# Patient Record
Sex: Female | Born: 1955 | Race: Black or African American | Hispanic: No | Marital: Single | Smoking: Current every day smoker
Health system: Southern US, Community
[De-identification: ages and names within clinical notes are randomized; demographics above are authoritative.]

## PROBLEM LIST (undated history)

## (undated) DIAGNOSIS — F419 Anxiety disorder, unspecified: Secondary | ICD-10-CM

## (undated) DIAGNOSIS — Z780 Asymptomatic menopausal state: Secondary | ICD-10-CM

## (undated) DIAGNOSIS — F411 Generalized anxiety disorder: Secondary | ICD-10-CM

## (undated) DIAGNOSIS — F1020 Alcohol dependence, uncomplicated: Secondary | ICD-10-CM

## (undated) DIAGNOSIS — S83281A Other tear of lateral meniscus, current injury, right knee, initial encounter: Secondary | ICD-10-CM

## (undated) DIAGNOSIS — G4733 Obstructive sleep apnea (adult) (pediatric): Secondary | ICD-10-CM

## (undated) DIAGNOSIS — R519 Headache, unspecified: Secondary | ICD-10-CM

## (undated) DIAGNOSIS — I10 Essential (primary) hypertension: Secondary | ICD-10-CM

## (undated) DIAGNOSIS — K219 Gastro-esophageal reflux disease without esophagitis: Secondary | ICD-10-CM

## (undated) DIAGNOSIS — G8929 Other chronic pain: Secondary | ICD-10-CM

## (undated) DIAGNOSIS — Z972 Presence of dental prosthetic device (complete) (partial): Secondary | ICD-10-CM

## (undated) DIAGNOSIS — B029 Zoster without complications: Secondary | ICD-10-CM

## (undated) DIAGNOSIS — J439 Emphysema, unspecified: Secondary | ICD-10-CM

## (undated) DIAGNOSIS — G4730 Sleep apnea, unspecified: Secondary | ICD-10-CM

## (undated) DIAGNOSIS — R011 Cardiac murmur, unspecified: Secondary | ICD-10-CM

## (undated) DIAGNOSIS — E785 Hyperlipidemia, unspecified: Secondary | ICD-10-CM

## (undated) DIAGNOSIS — F32A Depression, unspecified: Secondary | ICD-10-CM

## (undated) DIAGNOSIS — Z973 Presence of spectacles and contact lenses: Secondary | ICD-10-CM

## (undated) DIAGNOSIS — E0500 Thyrotoxicosis with diffuse goiter without thyrotoxic crisis or storm: Secondary | ICD-10-CM

## (undated) DIAGNOSIS — M549 Dorsalgia, unspecified: Secondary | ICD-10-CM

## (undated) DIAGNOSIS — J449 Chronic obstructive pulmonary disease, unspecified: Secondary | ICD-10-CM

## (undated) DIAGNOSIS — R51 Headache: Secondary | ICD-10-CM

## (undated) DIAGNOSIS — F329 Major depressive disorder, single episode, unspecified: Secondary | ICD-10-CM

## (undated) DIAGNOSIS — M069 Rheumatoid arthritis, unspecified: Secondary | ICD-10-CM

## (undated) DIAGNOSIS — IMO0002 Reserved for concepts with insufficient information to code with codable children: Secondary | ICD-10-CM

## (undated) DIAGNOSIS — T7840XA Allergy, unspecified, initial encounter: Secondary | ICD-10-CM

## (undated) DIAGNOSIS — M1990 Unspecified osteoarthritis, unspecified site: Secondary | ICD-10-CM

## (undated) DIAGNOSIS — J392 Other diseases of pharynx: Secondary | ICD-10-CM

## (undated) DIAGNOSIS — G629 Polyneuropathy, unspecified: Secondary | ICD-10-CM

## (undated) DIAGNOSIS — M810 Age-related osteoporosis without current pathological fracture: Secondary | ICD-10-CM

## (undated) DIAGNOSIS — K429 Umbilical hernia without obstruction or gangrene: Secondary | ICD-10-CM

## (undated) HISTORY — DX: Emphysema, unspecified: J43.9

## (undated) HISTORY — DX: Hyperlipidemia, unspecified: E78.5

## (undated) HISTORY — DX: Alcohol dependence, uncomplicated: F10.20

## (undated) HISTORY — PX: TUBAL LIGATION: SHX77

## (undated) HISTORY — PX: TONSILLECTOMY: SUR1361

## (undated) HISTORY — DX: Thyrotoxicosis with diffuse goiter without thyrotoxic crisis or storm: E05.00

## (undated) HISTORY — DX: Age-related osteoporosis without current pathological fracture: M81.0

## (undated) HISTORY — DX: Headache: R51

## (undated) HISTORY — DX: Headache, unspecified: R51.9

## (undated) HISTORY — DX: Polyneuropathy, unspecified: G62.9

## (undated) HISTORY — DX: Reserved for concepts with insufficient information to code with codable children: IMO0002

## (undated) HISTORY — DX: Other chronic pain: G89.29

## (undated) HISTORY — PX: FOOT SURGERY: SHX648

## (undated) HISTORY — PX: LAPAROSCOPY: SHX197

## (undated) HISTORY — DX: Obstructive sleep apnea (adult) (pediatric): G47.33

## (undated) HISTORY — PX: CHOLECYSTECTOMY: SHX55

## (undated) HISTORY — PX: EYE SURGERY: SHX253

## (undated) HISTORY — DX: Cardiac murmur, unspecified: R01.1

## (undated) HISTORY — DX: Allergy, unspecified, initial encounter: T78.40XA

## (undated) HISTORY — DX: Essential (primary) hypertension: I10

## (undated) HISTORY — DX: Gastro-esophageal reflux disease without esophagitis: K21.9

---

## 2011-03-02 ENCOUNTER — Institutional Professional Consult (permissible substitution): Payer: Self-pay | Admitting: Pulmonary Disease

## 2011-03-18 ENCOUNTER — Ambulatory Visit (INDEPENDENT_AMBULATORY_CARE_PROVIDER_SITE_OTHER): Payer: Medicaid Other | Admitting: Pulmonary Disease

## 2011-03-18 ENCOUNTER — Encounter: Payer: Self-pay | Admitting: Pulmonary Disease

## 2011-03-18 VITALS — BP 128/84 | HR 91 | Temp 98.6°F | Ht 69.0 in | Wt 241.6 lb

## 2011-03-18 DIAGNOSIS — G4733 Obstructive sleep apnea (adult) (pediatric): Secondary | ICD-10-CM

## 2011-03-18 NOTE — Assessment & Plan Note (Signed)
The patient has a diagnosis of obstructive sleep apnea, and has been on CPAP successfully since 2009.  She has just moved here recently, and needs to establish with a medical equipment company.  She feels that she is not sleeping as well, and that she may be having breakthrough obstructive events.  Her weight is up about 30 pounds over the last few years, and it is likely that she may need an increase in her CPAP pressure.  The condition of her mask and supplies is also unknown.  I will set her up with a DME, check on her machine, and also look again at her pressure with an auto device for 2 weeks.  I have also encouraged her to work aggressively on weight loss.

## 2011-03-18 NOTE — Patient Instructions (Signed)
Will get you set up with medical equipment company Will re-optimize your pressure at home for next 2 weeks, and will let you know the results. Work on weight loss If doing well, will see you back in 6mos.

## 2011-03-18 NOTE — Progress Notes (Signed)
  Subjective:    Patient ID: Vanessa [name], female    DOB: 09-07-1955, 55 y.o.   MRN: [id number]  HPI The patient is a 55 year old female who I've been asked to see for management of obstructive sleep apnea.  The patient recently moved from [place], and needs to establish with a sleep physician and durable medical equipment company.  The patient was diagnosed with sleep apnea in 2009, and has been on CPAP since that time.  She feels that she has done well with the device, however most recently feels that she is having breakthrough obstructive events which leads to nonrestorative sleep.  The patient currently uses a nasal CPAP mask, and does have a heated humidifier.  She feels that her alertness during the day is adequate, and her Epworth score is only 8.  She does state that her weight is up about 30 pounds over the last 2 years.  Sleep Questionnaire: What time do you typically go to bed?( Between what hours) 9:00 pm How long does it take you to fall asleep? 2 How many times during the night do you wake up? What time do you get out of bed to start your day? 0200 1400 Do you drive or operate heavy machinery in your occupation? No How much has your weight changed (up or down) over the past two years? (In pounds) 30 lb (13.608 kg) Have you ever had a sleep study before? Yes If yes, location of study? [place], [place] with Dr. Lendell Caprice If yes, date of study? 2009 Do you currently use CPAP? Yes If so, what pressure? 15cm Do you wear oxygen at any time? No     Review of Systems  Constitutional: Positive for unexpected weight change. Negative for fever.  HENT: Negative for ear pain, nosebleeds, congestion, sore throat, rhinorrhea, sneezing, trouble swallowing, dental problem, postnasal drip and sinus pressure.   Eyes: Negative for redness and itching.  Respiratory: Positive for cough and shortness of breath. Negative for chest tightness and wheezing.   Cardiovascular: Positive for palpitations and leg  swelling.  Gastrointestinal: Negative for nausea and vomiting.  Genitourinary: Negative for dysuria.  Musculoskeletal: Positive for joint swelling.  Skin: Negative for rash.  Neurological: Negative for headaches.  Hematological: Does not bruise/bleed easily.  Psychiatric/Behavioral: Positive for dysphoric mood. Negative for decreased concentration. The patient is nervous/anxious.        Objective:   Physical Exam Constitutional:  Obese female, no acute distress  HENT:  Nares patent without discharge  Oropharynx without exudate, palate elongated, uvula normal, +side wall narrowing.  Eyes:  Perrla, eomi, no scleral icterus  Neck:  No JVD, no TMG  Cardiovascular:  Normal rate, regular rhythm, no rubs or gallops.  No murmurs        Intact distal pulses  Pulmonary :  Normal breath sounds, no stridor or respiratory distress   No rales, rhonchi, or wheezing  Abdominal:  Soft, nondistended, bowel sounds present.  No tenderness noted.   Musculoskeletal:  No lower extremity edema noted.  Lymph Nodes:  No cervical lymphadenopathy noted  Skin:  No cyanosis noted  Neurologic:  Alert, appropriate, moves all 4 extremities without obvious deficit.         Assessment & Plan:

## 2011-04-04 ENCOUNTER — Encounter: Payer: Self-pay | Admitting: Family Medicine

## 2011-04-04 NOTE — Telephone Encounter (Signed)
Error

## 2011-04-16 ENCOUNTER — Telehealth: Payer: Self-pay | Admitting: Pulmonary Disease

## 2011-04-16 NOTE — Telephone Encounter (Signed)
PCCs, please advise.  [facility] sent order 03/18/11 for DME referral to check her cpap machine, get download and put machine on auto.  Pt stating this hasn't been done yet.  Please advise.  Thanks.

## 2011-04-17 NOTE — Telephone Encounter (Signed)
DME is unable to set pt up with any supplies until sleep study has been received. Called and spoke with patient and she is aware that is why she has not heard from DME. Pt was advised that this may be an issue when order was placed. Asked patient to call Dr. Jilda Roche office and let them know that we have not received this study and she is unable to move forward with any supplies or equipment until this is received. Phone note sent back to Dr. Teddy Spike nurse to refax medical release as a second request to try and obtain study.

## 2011-04-22 NOTE — Telephone Encounter (Signed)
Finally received her sleep study results.  I have given these results to Bjorn Loser to fax over to pt's DME company so pt can get set up with cpap machine/supplies.  Called and spoke with pt. Pt aware of this.  Nothing further needed.

## 2011-05-06 ENCOUNTER — Encounter: Payer: Self-pay | Admitting: Pulmonary Disease

## 2011-05-28 ENCOUNTER — Telehealth: Payer: Self-pay | Admitting: *Deleted

## 2011-05-28 NOTE — Telephone Encounter (Signed)
Michelle at [facility] requesting pt's height and weight for pt/s medical equipment.  Info given.

## 2011-06-04 ENCOUNTER — Encounter (HOSPITAL_COMMUNITY): Payer: Self-pay

## 2011-06-04 ENCOUNTER — Emergency Department (HOSPITAL_COMMUNITY)
Admission: EM | Admit: 2011-06-04 | Discharge: 2011-06-04 | Disposition: A | Discharge status: Home / Self Care | Payer: Medicaid Other | Attending: Emergency Medicine | Admitting: Emergency Medicine

## 2011-06-04 ENCOUNTER — Emergency Department (HOSPITAL_COMMUNITY): Payer: Medicaid Other

## 2011-06-04 DIAGNOSIS — Z79899 Other long term (current) drug therapy: Secondary | ICD-10-CM | POA: Insufficient documentation

## 2011-06-04 DIAGNOSIS — G4733 Obstructive sleep apnea (adult) (pediatric): Secondary | ICD-10-CM | POA: Insufficient documentation

## 2011-06-04 DIAGNOSIS — I10 Essential (primary) hypertension: Secondary | ICD-10-CM | POA: Insufficient documentation

## 2011-06-04 DIAGNOSIS — S92919A Unspecified fracture of unspecified toe(s), initial encounter for closed fracture: Secondary | ICD-10-CM | POA: Insufficient documentation

## 2011-06-04 DIAGNOSIS — E119 Type 2 diabetes mellitus without complications: Secondary | ICD-10-CM | POA: Insufficient documentation

## 2011-06-04 DIAGNOSIS — S90129A Contusion of unspecified lesser toe(s) without damage to nail, initial encounter: Secondary | ICD-10-CM | POA: Insufficient documentation

## 2011-06-04 DIAGNOSIS — M79609 Pain in unspecified limb: Secondary | ICD-10-CM | POA: Insufficient documentation

## 2011-06-04 DIAGNOSIS — J438 Other emphysema: Secondary | ICD-10-CM | POA: Insufficient documentation

## 2011-06-04 DIAGNOSIS — E0500 Thyrotoxicosis with diffuse goiter without thyrotoxic crisis or storm: Secondary | ICD-10-CM | POA: Insufficient documentation

## 2011-06-04 DIAGNOSIS — E785 Hyperlipidemia, unspecified: Secondary | ICD-10-CM | POA: Insufficient documentation

## 2011-06-04 DIAGNOSIS — W2203XA Walked into furniture, initial encounter: Secondary | ICD-10-CM | POA: Insufficient documentation

## 2011-06-04 MED ORDER — OXYCODONE-ACETAMINOPHEN 5-325 MG PO TABS: 2.0000 | ORAL_TABLET | ORAL | Status: AC | PRN

## 2011-06-04 MED ORDER — OXYCODONE-ACETAMINOPHEN 5-325 MG PO TABS
1.0000 | ORAL_TABLET | Freq: Once | ORAL | Status: AC
  Administered 2011-06-04: 1 via ORAL
  Filled 2011-06-04: qty 1

## 2011-06-04 NOTE — Progress Notes (Signed)
Orthopedic Tech Progress Note Patient Details:  Vanessa [name] 07-11-1955 [id number]  Other Ortho Devices Type of Ortho Device: Buddy tape Ortho Device Interventions: Application   Cammer, Mickie Bail 06/04/2011, 1:58 PM

## 2011-06-04 NOTE — ED Notes (Signed)
Pt presents with bilateral 5th toe pain.  Pt reports stumping L 5th toe x 2 weeks ago and again 2 nights ago as well as R 5th toe x 2 night ago.

## 2011-06-04 NOTE — ED Provider Notes (Signed)
History     CSN: [id number]  Arrival date & time 06/04/11  1045   First MD Initiated Contact with Patient 06/04/11 1204      Chief Complaint  Patient presents with  . Toe Pain    (Consider location/radiation/quality/duration/timing/severity/associated sxs/prior treatment) Patient is a 56 y.o. female presenting with toe pain. The history is provided by the patient.  Toe Pain   patient presents with bilateral fifth toe pain after sustaining an injury to both toes against a piece of furniture. Pain described as sharp worse with walking made better with rest. Pain medications given prior to arrival haven't helped somewhat. No treatment done prior to arrival  Past Medical History  Diagnosis Date  . Hypertension   . Heart murmur   . Asthma   . Emphysema   . Diabetes mellitus   . Hyperlipidemia   . Allergic rhinitis   . Chronic headache   . OSA (obstructive sleep apnea)   . Neuropathy   . Grave's disease   . Herniated disc     Past Surgical History  Procedure Date  . Cholecystectomy   . Tubal ligation     Family History  Problem Relation Age of Onset  . Emphysema Mother   . Allergies Mother   . Allergies Daughter   . Asthma Mother   . Asthma Brother   . Heart disease Mother   . Rheum arthritis Mother   . Breast cancer Other     aunt  . Lung cancer Brother   . Throat cancer Brother     History  Substance Use Topics  . Smoking status: Current Everyday Smoker -- 1.0 packs/day for 40 years    Types: Cigarettes  . Smokeless tobacco: Not on file  . Alcohol Use: Not on file    OB History    Grav Para Term Preterm Abortions TAB SAB Ect Mult Living                  Review of Systems  All other systems reviewed and are negative.    Allergies  Ciprofloxacin and Penicillins  Home Medications   Current Outpatient Rx  Name Route Sig Dispense Refill  . ACYCLOVIR 800 MG PO TABS Oral Take 800 mg by mouth daily.      . ALBUTEROL SULFATE HFA 108 (90 BASE)  MCG/ACT IN AERS Inhalation Inhale 2 puffs into the lungs every 6 (six) hours as needed.      . BC FAST PAIN RELIEF PO Oral Take 3 Packages by mouth once.    . BECLOMETHASONE DIPROPIONATE 80 MCG/ACT IN AERS Inhalation Inhale 1 puff into the lungs 3 (three) times daily.      . BUSPIRONE HCL 30 MG PO TABS Oral Take 30 mg by mouth 2 (two) times daily.      . CYCLOBENZAPRINE HCL 10 MG PO TABS Oral Take 10 mg by mouth 3 (three) times daily.      Marland Kitchen FLUOXETINE HCL 20 MG PO CAPS Oral Take 60 mg by mouth daily.      Marland Kitchen HYDROCODONE-ACETAMINOPHEN 10-325 MG PO TABS Oral Take 1 tablet by mouth 3 (three) times daily.    . IPRATROPIUM-ALBUTEROL 0.5-2.5 (3) MG/3ML IN SOLN Nebulization Take 3 mLs by nebulization as needed.      Marland Kitchen LEVOTHYROXINE SODIUM 125 MCG PO TABS Oral Take 125 mcg by mouth daily.      Marland Kitchen LISINOPRIL 5 MG PO TABS Oral Take 5 mg by mouth daily.      Marland Kitchen  METFORMIN HCL 1000 MG PO TABS Oral Take 1,000 mg by mouth 2 (two) times daily.      Marland Kitchen MIRTAZAPINE 45 MG PO TABS Oral Take 45 mg by mouth at bedtime.      Marland Kitchen SIMVASTATIN 40 MG PO TABS Oral Take 40 mg by mouth at bedtime.        BP 140/93  Pulse 91  Temp(Src) 98.2 F (36.8 C) (Oral)  Resp 20  SpO2 97%  Physical Exam  Nursing note and vitals reviewed. Constitutional: She is oriented to person, place, and time. She appears well-developed and well-nourished.  Non-toxic appearance.  HENT:  Head: Normocephalic and atraumatic.  Eyes: Conjunctivae are normal. Pupils are equal, round, and reactive to light.  Neck: Normal range of motion.  Cardiovascular: Normal rate.   Pulmonary/Chest: Effort normal.  Musculoskeletal:       Fifth digits of both toes with slight tenderness and bruising. Range of motion is somewhat limited by pain skin intact  Neurological: She is alert and oriented to person, place, and time.  Skin: Skin is warm and dry.  Psychiatric: She has a normal mood and affect.    ED Course  Procedures (including critical care  time)  Labs Reviewed - No data to display No results found.   No diagnosis found.    MDM  Toe x-rays noted. Patient's toes buddy taped.        Toy Baker, MD 06/04/11 1324

## 2011-06-04 NOTE — Progress Notes (Signed)
Orthopedic Tech Progress Note Patient Details:  Vanessa [name] [birth year] [id number]  Other Ortho Devices Type of Ortho Device: Buddy tape Ortho Device Interventions: Application   Cammer, Mickie Bail 06/04/2011, 1:58 PM Buddy tape x2

## 2011-06-04 NOTE — Discharge Instructions (Signed)
Toe Fracture  Your caregiver has diagnosed you as having a fractured toe. A toe fracture is a break in the bone of a toe. "Buddy taping" is a way of splinting your broken toe, by taping the broken toe to the toe next to it. This "buddy taping" will keep the injured toe from moving beyond normal range of motion. Buddy taping also helps the toe heal in a more normal alignment. It may take 6 to 8 weeks for the toe injury to heal.  HOME CARE INSTRUCTIONS   · Leave your toes taped together for as long as directed by your caregiver or until you see a doctor for a follow-up examination. You can change the tape after bathing. Always use a small piece of gauze or cotton between the toes when taping them together. This will help the skin stay dry and prevent infection.  · Apply ice to the injury for 15 to 20 minutes each hour while awake for the first 2 days. Put the ice in a plastic bag and place a towel between the bag of ice and your skin.  · After the first 2 days, apply heat to the injured area. Use heat for the next 2 to 3 days. Place a heating pad on the foot or soak the foot in warm water as directed by your caregiver.  · Keep your foot elevated as much as possible to lessen swelling.  · Wear sturdy, supportive shoes. The shoes should not pinch the toes or fit tightly against the toes.  · Your caregiver may prescribe a rigid shoe if your foot is very swollen.  · Your may be given crutches if the pain is too great and it hurts too much to walk.  · Only take over-the-counter or prescription medicines for pain, discomfort, or fever as directed by your caregiver.  · If your caregiver has given you a follow-up appointment, it is very important to keep that appointment. Not keeping the appointment could result in a chronic or permanent injury, pain, and disability. If there is any problem keeping the appointment, you must call back to this facility for assistance.  SEEK MEDICAL CARE IF:   · You have increased pain or  swelling, not relieved with medications.  · The pain does not get better after 1 week.  · Your injured toe is cold when the others are warm.  SEEK IMMEDIATE MEDICAL CARE IF:   · The toe becomes cold, numb, or white.  · The toe becomes hot (inflamed) and red.  Document Released: 03/13/2000 Document Revised: 03/05/2011 Document Reviewed: 10/31/2007  ExitCare® Patient Information ©2012 [employer].

## 2011-06-04 NOTE — ED Notes (Signed)
Ortho tech paged  

## 2011-06-08 ENCOUNTER — Telehealth: Payer: Self-pay | Admitting: Pulmonary Disease

## 2011-06-08 NOTE — Telephone Encounter (Signed)
Will send to Alida to see if she has seen this CMN.

## 2011-06-08 NOTE — Telephone Encounter (Signed)
Spoke with Marcelino Duster at [facility] and informed her we do have the cmn for cpap supplies, it is in Dr Shelle Iron to sign folder, once it is signed we will fax .Kandice Hams

## 2011-06-22 ENCOUNTER — Telehealth: Payer: Self-pay | Admitting: Pulmonary Disease

## 2011-06-30 ENCOUNTER — Telehealth: Payer: Self-pay | Admitting: Pulmonary Disease

## 2011-06-30 NOTE — Telephone Encounter (Signed)
KC, these results have been palced in your VIP folder. Please advise thanks

## 2011-06-30 NOTE — Telephone Encounter (Signed)
I spoke with pt she states she had a download her cpap downloaded a while ago but never heard anything back regarding this.  Aundra Millet looked in [employer] and did not receive this. I called [facility] and they are faxing download over now. Will await fax

## 2011-07-01 NOTE — Telephone Encounter (Signed)
I spoke with Stacy at [facility], [phone], and she is aware that Dr. Shelle Iron only wants pt's download from Dec 2012 through now. Stacy verbalized understanding and will fax this to triage at [phone], ATTN:  Jerrine Urschel.

## 2011-07-01 NOTE — Telephone Encounter (Signed)
Let pt know that I never received her download until yesterday.  They also downloaded data from June of last year until now, rather than from Dec when I ordered the download.  Will need to download her machine again from Dec 2012 until now!!!!! As ordered.  Have them fax this to triage and then to me directly with note to check it.  Thanks.

## 2011-07-01 NOTE — Telephone Encounter (Signed)
Will forward msg to Aundra Millet so she may look for download being refaxed.

## 2011-07-03 NOTE — Telephone Encounter (Signed)
Called and spoke with Dawn at [facility] and stated we still haven't received results.  Asked that she refax to the triage fax #.  Awaiting fax.

## 2011-07-03 NOTE — Telephone Encounter (Signed)
Received download from [facility] and data was from wrong AGAIN!! Still showed data from June 2012 to Feb 2013.  Called and spoke with Selena Batten at [facility] and asked they refax with correct data from Dec 2012 to current.  Selena Batten stated she would take care of this

## 2011-07-08 NOTE — Telephone Encounter (Signed)
Kim from [facility] called & asked to speak w/ Megan.  Selena Batten can be reached at [phone].  Antionette Fairy

## 2011-07-08 NOTE — Telephone Encounter (Signed)
Still haven't received download.  Called and spoke with Sue Lush at [facility] and informed her I still have received results and they needs to be addressed TODAY!!!  Sue Lush stated she would address this and fax results today.

## 2011-07-08 NOTE — Telephone Encounter (Signed)
Aundra Millet did you ever receive correct fax. Please advise thanks

## 2011-07-08 NOTE — Telephone Encounter (Signed)
Called and spoke with Selena Batten and informed her correct download finally received via fax.  Put download in [facility] VIP folder for him to review

## 2011-07-09 ENCOUNTER — Other Ambulatory Visit: Payer: Self-pay | Admitting: Internal Medicine

## 2011-07-09 ENCOUNTER — Encounter: Payer: Self-pay | Admitting: Pulmonary Disease

## 2011-07-09 DIAGNOSIS — Z1231 Encounter for screening mammogram for malignant neoplasm of breast: Secondary | ICD-10-CM

## 2011-07-09 NOTE — Telephone Encounter (Signed)
Let pt know that her download shows optimal pressure 15cm, but she had very large mask leak with breakthrough events.  The options are to get a better fitting mask, then redo the auto for 2 weeks vs putting on 15cm and getting a better fitting mask and see how she does.  Then if she doesn't do well, can redo the auto for 2 weeks. See what she wants to do.

## 2011-07-09 NOTE — Telephone Encounter (Signed)
ATC the pt, NA and not able to leave a msg, [facility]

## 2011-07-10 ENCOUNTER — Other Ambulatory Visit: Payer: Self-pay | Admitting: Pulmonary Disease

## 2011-07-10 DIAGNOSIS — G4733 Obstructive sleep apnea (adult) (pediatric): Secondary | ICD-10-CM

## 2011-07-10 NOTE — Telephone Encounter (Signed)
Spoke with pt and notified of results/recs per KC. She verbalized understanding. States that she prefers to have the new mask and redo the auto download. Will forward back to [facility].

## 2011-07-15 NOTE — Telephone Encounter (Signed)
Order placed on 07/10/11. Will close this msg.

## 2011-07-21 ENCOUNTER — Inpatient Hospital Stay (HOSPITAL_COMMUNITY)
Admission: AD | Admit: 2011-07-21 | Discharge: 2011-07-21 | Disposition: A | Discharge status: Home / Self Care | Payer: Medicaid Other | Source: Ambulatory Visit | Attending: Obstetrics & Gynecology | Admitting: Obstetrics & Gynecology

## 2011-07-21 ENCOUNTER — Encounter (HOSPITAL_COMMUNITY): Payer: Self-pay | Admitting: *Deleted

## 2011-07-21 DIAGNOSIS — J4489 Other specified chronic obstructive pulmonary disease: Secondary | ICD-10-CM | POA: Insufficient documentation

## 2011-07-21 DIAGNOSIS — E119 Type 2 diabetes mellitus without complications: Secondary | ICD-10-CM | POA: Insufficient documentation

## 2011-07-21 DIAGNOSIS — J449 Chronic obstructive pulmonary disease, unspecified: Secondary | ICD-10-CM | POA: Insufficient documentation

## 2011-07-21 DIAGNOSIS — R059 Cough, unspecified: Secondary | ICD-10-CM | POA: Insufficient documentation

## 2011-07-21 DIAGNOSIS — R05 Cough: Secondary | ICD-10-CM | POA: Insufficient documentation

## 2011-07-21 DIAGNOSIS — B373 Candidiasis of vulva and vagina: Secondary | ICD-10-CM

## 2011-07-21 DIAGNOSIS — B3731 Acute candidiasis of vulva and vagina: Secondary | ICD-10-CM

## 2011-07-21 DIAGNOSIS — I10 Essential (primary) hypertension: Secondary | ICD-10-CM | POA: Insufficient documentation

## 2011-07-21 HISTORY — DX: Unspecified osteoarthritis, unspecified site: M19.90

## 2011-07-21 HISTORY — DX: Other chronic pain: G89.29

## 2011-07-21 HISTORY — DX: Chronic obstructive pulmonary disease, unspecified: J44.9

## 2011-07-21 HISTORY — DX: Zoster without complications: B02.9

## 2011-07-21 HISTORY — DX: Major depressive disorder, single episode, unspecified: F32.9

## 2011-07-21 HISTORY — DX: Asymptomatic menopausal state: Z78.0

## 2011-07-21 HISTORY — DX: Dorsalgia, unspecified: M54.9

## 2011-07-21 HISTORY — DX: Sleep apnea, unspecified: G47.30

## 2011-07-21 HISTORY — DX: Depression, unspecified: F32.A

## 2011-07-21 HISTORY — DX: Anxiety disorder, unspecified: F41.9

## 2011-07-21 HISTORY — DX: Umbilical hernia without obstruction or gangrene: K42.9

## 2011-07-21 LAB — URINALYSIS, ROUTINE W REFLEX MICROSCOPIC
Glucose, UA: NEGATIVE mg/dL
Hgb urine dipstick: NEGATIVE
Ketones, ur: NEGATIVE mg/dL
Protein, ur: NEGATIVE mg/dL
Specific Gravity, Urine: >1.03 — ABNORMAL HIGH (ref 1.005–1.030)
pH: 6 (ref 5.0–8.0)

## 2011-07-21 LAB — WET PREP, GENITAL: Clue Cells Wet Prep HPF POC: NONE SEEN

## 2011-07-21 MED ORDER — FLUCONAZOLE 150 MG PO TABS: 150.0000 mg | ORAL_TABLET | Freq: Once | ORAL | Status: AC

## 2011-07-21 NOTE — MAU Note (Signed)
Patient states she was seen by her primary MD on 4-16 [facility]). States she has had a chronic cough, all over aching, back pain, shingles, thrush, dry mouth, infection in toe and foot. Itchy rash on bottom. States she is not getting better.

## 2011-07-21 NOTE — Discharge Instructions (Signed)
Candida Infection, Adult A candida infection (also called yeast, fungus and Monilia infection) is an overgrowth of yeast that can occur anywhere on the body. A yeast infection commonly occurs in warm, moist body areas. Usually, the infection remains localized but can spread to become a systemic infection. A yeast infection may be a sign of a more severe disease such as diabetes, leukemia, or AIDS. A yeast infection can occur in both men and women. In women, Candida vaginitis is a vaginal infection. It is one of the most common causes of vaginitis. Men usually do not have symptoms or know they have an infection until other problems develop. Men may find out they have a yeast infection because their sex partner has a yeast infection. Uncircumcised men are more likely to get a yeast infection than circumcised men. This is because the uncircumcised glans is not exposed to air and does not remain as dry as that of a circumcised glans. Older adults may develop yeast infections around dentures. CAUSES  Women  Antibiotics.   Steroid medication taken for a long time.   Being overweight (obese).   Diabetes.   Poor immune condition.   Certain serious medical conditions.   Immune suppressive medications for organ transplant patients.   Chemotherapy.   Pregnancy.   Menstration.   Stress and fatigue.   Intravenous drug use.   Oral contraceptives.   Wearing tight-fitting clothes in the crotch area.   Catching it from a sex partner who has a yeast infection.   Spermicide.   Intravenous, urinary, or other catheters.  Men  Catching it from a sex partner who has a yeast infection.   Having oral or anal sex with a person who has the infection.   Spermicide.   Diabetes.   Antibiotics.   Poor immune system.   Medications that suppress the immune system.   Intravenous drug use.   Intravenous, urinary, or other catheters.  SYMPTOMS  Women  Thick, white vaginal discharge.    Vaginal itching.   Redness and swelling in and around the vagina.   Irritation of the lips of the vagina and perineum.   Blisters on the vaginal lips and perineum.   Painful sexual intercourse.   Low blood sugar (hypoglycemia).   Painful urination.   Bladder infections.   Intestinal problems such as constipation, indigestion, bad breath, bloating, increase in gas, diarrhea, or loose stools.  Men  Men may develop intestinal problems such as constipation, indigestion, bad breath, bloating, increase in gas, diarrhea, or loose stools.   Dry, cracked skin on the penis with itching or discomfort.   Jock itch.   Dry, flaky skin.   Athlete's foot.   Hypoglycemia.  DIAGNOSIS  Women  A history and an exam are performed.   The discharge may be examined under a microscope.   A culture may be taken of the discharge.  Men  A history and an exam are performed.   Any discharge from the penis or areas of cracked skin will be looked at under the microscope and cultured.   Stool samples may be cultured.  TREATMENT  Women  Vaginal antifungal suppositories and creams.   Medicated creams to decrease irritation and itching on the outside of the vagina.   Warm compresses to the perineal area to decrease swelling and discomfort.   Oral antifungal medications.   Medicated vaginal suppositories or cream for repeated or recurrent infections.   Wash and dry the irritation areas before applying the cream.     Eating yogurt with lactobacillus may help with prevention and treatment.   Sometimes painting the vagina with gentian violet solution may help if creams and suppositories do not work.  Men  Antifungal creams and oral antifungal medications.   Sometimes treatment must continue for 30 days after the symptoms go away to prevent recurrence.  HOME CARE INSTRUCTIONS  Women  Use cotton underwear and avoid tight-fitting clothing.   Avoid colored, scented toilet paper and  deodorant tampons or pads.   Do not douche.   Keep your diabetes under control.   Finish all the prescribed medications.   Keep your skin clean and dry.   Consume milk or yogurt with lactobacillus active culture regularly. If you get frequent yeast infections and think that is what the infection is, there are over-the-counter medications that you can get. If the infection does not show healing in 3 days, talk to your caregiver.   Tell your sex partner you have a yeast infection. Your partner may need treatment also, especially if your infection does not clear up or recurs.  Men  Keep your skin clean and dry.   Keep your diabetes under control.   Finish all prescribed medications.   Tell your sex partner that you have a yeast infection so they can be treated if necessary.  SEEK MEDICAL CARE IF:   Your symptoms do not clear up or worsen in one week after treatment.   You have an oral temperature above 102 F (38.9 C).   You have trouble swallowing or eating for a prolonged time.   You develop blisters on and around your vagina.   You develop vaginal bleeding and it is not your menstrual period.   You develop abdominal pain.   You develop intestinal problems as mentioned above.   You get weak or lightheaded.   You have painful or increased urination.   You have pain during sexual intercourse.  MAKE SURE YOU:   Understand these instructions.   Will watch your condition.   Will get help right away if you are not doing well or get worse.  Document Released: 04/23/2004 Document Revised: 03/05/2011 Document Reviewed: 08/05/2009 ExitCare Patient Information 2012 [employer]. 

## 2011-07-21 NOTE — MAU Provider Note (Signed)
History     CSN: [id number]  Arrival date and time: 07/21/11 1531   First Provider Initiated Contact with Patient 07/21/11 1658      Chief Complaint  Patient presents with  . Cough  . Back Pain  . Thrush   HPI Pt is not pregnant post menopausal female with multiple chronic illnesses including diabetes, asthma, emphysema with COPD, sleep apnea, hypertension, neuropathy, chronic back pain with herniated disc, Grave's disease, etc.  She was seen by her PCP on 4/16 [facility] with chronic cough, all over aching, back pain, shingles, thrush, itching rash on her bottom that has not improved. She is using Magic Mouthwash for her thrush. She complains of sore throat and concerned if she has a sore throat.  She was told to go to the emergency room.  She also sees Dr. Shelle Iron as a pulmonologist for her sleep apnea and COPD.  She is to see Dr. Rene Paci for about 6 to 7 months with [facility].  She complains of vaginal discharge and aching in vagina.    Past Medical History  Diagnosis Date  . Hypertension   . Heart murmur   . Asthma   . Emphysema   . Diabetes mellitus   . Hyperlipidemia   . Allergic rhinitis   . Chronic headache   . OSA (obstructive sleep apnea)   . Neuropathy   . Grave's disease   . Herniated disc   . Arthritis   . COPD (chronic obstructive pulmonary disease)   . Sleep apnea   . Menopause   . Hernia, umbilical   . Anxiety   . Depression   . Back pain, chronic   . Shingles   . Herniated disc     Past Surgical History  Procedure Date  . Cholecystectomy   . Tubal ligation   . Laparoscopy   . Foot surgery   . Eye surgery     Family History  Problem Relation Age of Onset  . Emphysema Mother   . Allergies Mother   . Allergies Daughter   . Asthma Mother   . Asthma Brother   . Heart disease Mother   . Rheum arthritis Mother   . Breast cancer Other     aunt  . Lung cancer Brother   . Throat cancer Brother     History  Substance Use  Topics  . Smoking status: Current Everyday Smoker -- 1.0 packs/day for 40 years    Types: Cigarettes  . Smokeless tobacco: Not on file  . Alcohol Use: Not on file    Allergies:  Allergies  Allergen Reactions  . Ciprofloxacin Hives  . Penicillins Hives    Prescriptions prior to admission  Medication Sig Dispense Refill  . albuterol (2.5 MG/3ML) 0.083% NEBU 3 mL, albuterol (5 MG/ML) 0.5% NEBU 0.5 mL Inhale 5 mg into the lungs every 4 (four) hours as needed. For COPD      . Alum & Mag Hydroxide-Simeth (MAGIC MOUTHWASH) SOLN Take 10 mLs by mouth every 4 (four) hours as needed. Swish and spit out 2 teaspoonsful every 4 hours as needed for thrush      . diclofenac (VOLTAREN) 50 MG EC tablet Take 50 mg by mouth 2 (two) times daily.      Marland Kitchen tolnaftate (TINACTIN) 1 % spray Apply 1 spray topically 2 (two) times daily. For cracking/itching of feet      . acyclovir (ZOVIRAX) 800 MG tablet Take 800 mg by mouth daily.        Marland Kitchen  albuterol (PROAIR HFA) 108 (90 BASE) MCG/ACT inhaler Inhale 2 puffs into the lungs every 6 (six) hours as needed.        . Aspirin-Caffeine (BC FAST PAIN RELIEF PO) Take 3 Packages by mouth once.      . beclomethasone (QVAR) 80 MCG/ACT inhaler Inhale 1 puff into the lungs 3 (three) times daily.        . busPIRone (BUSPAR) 30 MG tablet Take 30 mg by mouth 2 (two) times daily.        . cyclobenzaprine (FLEXERIL) 10 MG tablet Take 10 mg by mouth 3 (three) times daily.        Marland Kitchen FLUoxetine (PROZAC) 20 MG capsule Take 60 mg by mouth daily.        Marland Kitchen HYDROcodone-acetaminophen (NORCO) 10-325 MG per tablet Take 1 tablet by mouth 3 (three) times daily.      Marland Kitchen ipratropium-albuterol (DUONEB) 0.5-2.5 (3) MG/3ML SOLN Take 3 mLs by nebulization as needed.        Marland Kitchen levothyroxine (SYNTHROID, LEVOTHROID) 125 MCG tablet Take 125 mcg by mouth daily.        Marland Kitchen lisinopril (PRINIVIL,ZESTRIL) 5 MG tablet Take 5 mg by mouth daily.        . metFORMIN (GLUCOPHAGE) 1000 MG tablet Take 1,000 mg by mouth 2  (two) times daily.        . mirtazapine (REMERON) 45 MG tablet Take 45 mg by mouth at bedtime.        . simvastatin (ZOCOR) 40 MG tablet Take 40 mg by mouth at bedtime.          ROS Physical Exam   Blood pressure 137/86, pulse 95, temperature 99.9 F (37.7 C), temperature source Oral, resp. rate 20, height 5\' 8"  (1.727 m), weight 243 lb 9.6 oz (110.496 kg), SpO2 97.00%.  Physical Exam  Nursing note and vitals reviewed. Constitutional: She is oriented to person, place, and time. She appears well-developed and well-nourished.       obese  HENT:  Head: Normocephalic.  Eyes: Pupils are equal, round, and reactive to light.  Neck: Normal range of motion. Neck supple.  Cardiovascular: Normal rate.   Respiratory: Effort normal.       COPD, emphysema  GI: Soft.  Genitourinary: Vagina normal.       hypoestrogenic slightly reddened; small amount of white vaginal discharge; cervix clean NT; bimanual nontender- unable to assess due to habitus  Musculoskeletal: Normal range of motion.  Neurological: She is alert and oriented to person, place, and time.  Skin: Skin is warm and dry.  Psychiatric: She has a normal mood and affect.    MAU Course  Procedures Discussed with pt that she had multiple complaints and issues that we could not address.  She wanted a strept throat culture and did complain about some vaginal itching - I told her we could   Results for orders placed during the hospital encounter of 07/21/11 (from the past 24 hour(s))  URINALYSIS, ROUTINE W REFLEX MICROSCOPIC     Status: Abnormal   Collection Time   07/21/11  5:30 PM      Component Value Range   Color, Urine YELLOW  YELLOW    APPearance HAZY (*) CLEAR    Specific Gravity, Urine >1.030 (*) 1.005 - 1.030    pH 6.0  5.0 - 8.0    Glucose, UA NEGATIVE  NEGATIVE (mg/dL)   Hgb urine dipstick NEGATIVE  NEGATIVE    Bilirubin Urine NEGATIVE  NEGATIVE  Ketones, ur NEGATIVE  NEGATIVE (mg/dL)   Protein, ur NEGATIVE  NEGATIVE  (mg/dL)   Urobilinogen, UA 0.2  0.0 - 1.0 (mg/dL)   Nitrite NEGATIVE  NEGATIVE    Leukocytes, UA NEGATIVE  NEGATIVE   will treat empirically for yeast  Assessment and Plan  Diabetes with yeast vaginitis Multiple chronic issues- to follow up with PCP and pulmonologist  [facility] 07/21/2011, 5:01 PM

## 2011-07-22 LAB — GC/CHLAMYDIA PROBE AMP, GENITAL: Chlamydia, DNA Probe: NEGATIVE

## 2011-07-23 ENCOUNTER — Ambulatory Visit: Payer: Medicaid Other

## 2011-09-16 ENCOUNTER — Ambulatory Visit: Payer: Medicaid Other | Admitting: Pulmonary Disease

## 2011-09-25 ENCOUNTER — Emergency Department (HOSPITAL_COMMUNITY): Payer: Medicaid Other

## 2011-09-25 ENCOUNTER — Emergency Department (HOSPITAL_COMMUNITY)
Admission: EM | Admit: 2011-09-25 | Discharge: 2011-09-25 | Disposition: A | Discharge status: Home / Self Care | Payer: Medicaid Other | Attending: Emergency Medicine | Admitting: Emergency Medicine

## 2011-09-25 ENCOUNTER — Encounter (HOSPITAL_COMMUNITY): Payer: Self-pay | Admitting: Emergency Medicine

## 2011-09-25 DIAGNOSIS — Z8249 Family history of ischemic heart disease and other diseases of the circulatory system: Secondary | ICD-10-CM | POA: Insufficient documentation

## 2011-09-25 DIAGNOSIS — Z803 Family history of malignant neoplasm of breast: Secondary | ICD-10-CM | POA: Insufficient documentation

## 2011-09-25 DIAGNOSIS — F411 Generalized anxiety disorder: Secondary | ICD-10-CM | POA: Insufficient documentation

## 2011-09-25 DIAGNOSIS — G4733 Obstructive sleep apnea (adult) (pediatric): Secondary | ICD-10-CM | POA: Insufficient documentation

## 2011-09-25 DIAGNOSIS — R011 Cardiac murmur, unspecified: Secondary | ICD-10-CM | POA: Insufficient documentation

## 2011-09-25 DIAGNOSIS — J40 Bronchitis, not specified as acute or chronic: Secondary | ICD-10-CM | POA: Insufficient documentation

## 2011-09-25 DIAGNOSIS — F17200 Nicotine dependence, unspecified, uncomplicated: Secondary | ICD-10-CM | POA: Insufficient documentation

## 2011-09-25 DIAGNOSIS — Z801 Family history of malignant neoplasm of trachea, bronchus and lung: Secondary | ICD-10-CM | POA: Insufficient documentation

## 2011-09-25 DIAGNOSIS — Z808 Family history of malignant neoplasm of other organs or systems: Secondary | ICD-10-CM | POA: Insufficient documentation

## 2011-09-25 DIAGNOSIS — Z881 Allergy status to other antibiotic agents status: Secondary | ICD-10-CM | POA: Insufficient documentation

## 2011-09-25 DIAGNOSIS — J4489 Other specified chronic obstructive pulmonary disease: Secondary | ICD-10-CM | POA: Insufficient documentation

## 2011-09-25 DIAGNOSIS — J449 Chronic obstructive pulmonary disease, unspecified: Secondary | ICD-10-CM | POA: Insufficient documentation

## 2011-09-25 DIAGNOSIS — Z880 Allergy status to penicillin: Secondary | ICD-10-CM | POA: Insufficient documentation

## 2011-09-25 DIAGNOSIS — Z825 Family history of asthma and other chronic lower respiratory diseases: Secondary | ICD-10-CM | POA: Insufficient documentation

## 2011-09-25 DIAGNOSIS — E785 Hyperlipidemia, unspecified: Secondary | ICD-10-CM | POA: Insufficient documentation

## 2011-09-25 DIAGNOSIS — R51 Headache: Secondary | ICD-10-CM | POA: Insufficient documentation

## 2011-09-25 DIAGNOSIS — F329 Major depressive disorder, single episode, unspecified: Secondary | ICD-10-CM | POA: Insufficient documentation

## 2011-09-25 DIAGNOSIS — I10 Essential (primary) hypertension: Secondary | ICD-10-CM | POA: Insufficient documentation

## 2011-09-25 DIAGNOSIS — E0500 Thyrotoxicosis with diffuse goiter without thyrotoxic crisis or storm: Secondary | ICD-10-CM | POA: Insufficient documentation

## 2011-09-25 DIAGNOSIS — J309 Allergic rhinitis, unspecified: Secondary | ICD-10-CM | POA: Insufficient documentation

## 2011-09-25 DIAGNOSIS — F3289 Other specified depressive episodes: Secondary | ICD-10-CM | POA: Insufficient documentation

## 2011-09-25 DIAGNOSIS — E119 Type 2 diabetes mellitus without complications: Secondary | ICD-10-CM | POA: Insufficient documentation

## 2011-09-25 LAB — BASIC METABOLIC PANEL
Chloride: 93 mEq/L — ABNORMAL LOW (ref 96–112)
Creatinine, Ser: 1.08 mg/dL (ref 0.50–1.10)
GFR calc Af Amer: 66 mL/min — ABNORMAL LOW (ref >90)
GFR calc non Af Amer: 57 mL/min — ABNORMAL LOW (ref >90)
Potassium: 3.8 mEq/L (ref 3.5–5.1)

## 2011-09-25 LAB — CBC
MCV: 85.7 fL (ref 78.0–100.0)
Platelets: 243 10*3/uL (ref 150–400)
RDW: 13.3 % (ref 11.5–15.5)
WBC: 10.6 10*3/uL — ABNORMAL HIGH (ref 4.0–10.5)

## 2011-09-25 LAB — POCT I-STAT TROPONIN I: Troponin i, poc: 0 ng/mL (ref 0.00–0.08)

## 2011-09-25 MED ORDER — BENZONATATE 100 MG PO CAPS: 100.0000 mg | ORAL_CAPSULE | Freq: Three times a day (TID) | ORAL | Status: AC

## 2011-09-25 MED ORDER — PREDNISONE 50 MG PO TABS: 50.0000 mg | ORAL_TABLET | Freq: Every day | ORAL | Status: AC

## 2011-09-25 MED ORDER — ALBUTEROL SULFATE HFA 108 (90 BASE) MCG/ACT IN AERS: 1.0000 | INHALATION_SPRAY | RESPIRATORY_TRACT | Status: DC | PRN

## 2011-09-25 NOTE — ED Provider Notes (Signed)
History     CSN: [id number]  Arrival date & time 09/25/11  1644   First MD Initiated Contact with Patient 09/25/11 2103      Chief Complaint  Patient presents with  . Chest Pain  . Cough    Patient is a 56 y.o. female presenting with cough. The history is provided by the patient.  Cough The current episode started more than 1 week ago. The problem occurs constantly. The problem has not changed since onset.The cough is non-productive. There has been no fever. Pertinent negatives include no shortness of breath.   the patient states initially after the symptoms had started she saw her primary Dr. This was about one week ago. She was started on antibiotics. Patient states she still has been coughing. She now has pain in her left rib area. It hurts when she coughs. She feels short of breath at times as well when she is coughing.  She has not brought up any sputum today. Patient also mentioned that her mouth feels sore at times. She has not had any trouble with wheezing. She does continue to smoke but she did not smoke one of the days that she has been ill recently because of the severity of her symptoms.  Past Medical History  Diagnosis Date  . Hypertension   . Heart murmur   . Asthma   . Emphysema   . Diabetes mellitus   . Hyperlipidemia   . Allergic rhinitis   . Chronic headache   . OSA (obstructive sleep apnea)   . Neuropathy   . Grave's disease   . Herniated disc   . Arthritis   . COPD (chronic obstructive pulmonary disease)   . Sleep apnea   . Menopause   . Hernia, umbilical   . Anxiety   . Depression   . Back pain, chronic   . Shingles   . Herniated disc     Past Surgical History  Procedure Date  . Cholecystectomy   . Tubal ligation   . Laparoscopy   . Foot surgery   . Eye surgery     Family History  Problem Relation Age of Onset  . Emphysema Mother   . Allergies Mother   . Allergies Daughter   . Asthma Mother   . Asthma Brother   . Heart disease Mother     . Rheum arthritis Mother   . Breast cancer Other     aunt  . Lung cancer Brother   . Throat cancer Brother     History  Substance Use Topics  . Smoking status: Current Everyday Smoker -- 1.0 packs/day for 40 years    Types: Cigarettes  . Smokeless tobacco: Not on file  . Alcohol Use: Not on file    OB History    Grav Para Term Preterm Abortions TAB SAB Ect Mult Living   5 3   2  2   3       Review of Systems  Respiratory: Positive for cough. Negative for shortness of breath.   All other systems reviewed and are negative.    Allergies  Ciprofloxacin and Penicillins  Home Medications   Current Outpatient Rx  Name Route Sig Dispense Refill  . ACETAMINOPHEN 500 MG PO TABS Oral Take 1,000 mg by mouth every 6 (six) hours as needed. For pain    . ACYCLOVIR 800 MG PO TABS Oral Take 800 mg by mouth daily.      . ALBUTEROL SULFATE HFA 108 (90 BASE) MCG/ACT  IN AERS Inhalation Inhale 2 puffs into the lungs every 6 (six) hours as needed. For COPD    . MAGIC MOUTHWASH Oral Take 10 mLs by mouth every 4 (four) hours as needed. Swish and spit out 2 teaspoonsful every 4 hours as needed for thrush    . BECLOMETHASONE DIPROPIONATE 80 MCG/ACT IN AERS Inhalation Inhale 2 puffs into the lungs every morning.     . BUSPIRONE HCL 30 MG PO TABS Oral Take 30 mg by mouth 2 (two) times daily.      . CYCLOBENZAPRINE HCL 10 MG PO TABS Oral Take 10 mg by mouth 3 (three) times daily as needed. For muscle spasm    . DICLOFENAC SODIUM 50 MG PO TBEC Oral Take 50 mg by mouth 2 (two) times daily.    Marland Kitchen FLUOXETINE HCL 20 MG PO CAPS Oral Take 60 mg by mouth daily.     Marland Kitchen GABAPENTIN 800 MG PO TABS Oral Take 800 mg by mouth 3 (three) times daily as needed. For nerve pain    . HYDROCODONE-ACETAMINOPHEN 10-325 MG PO TABS Oral Take 1 tablet by mouth 3 (three) times daily as needed. For pain    . IPRATROPIUM-ALBUTEROL 0.5-2.5 (3) MG/3ML IN SOLN Nebulization Take 3 mLs by nebulization 4 (four) times daily as needed. For  COPD    . LEVOTHYROXINE SODIUM 125 MCG PO TABS Oral Take 125 mcg by mouth daily.     Marland Kitchen LISINOPRIL 5 MG PO TABS Oral Take 5 mg by mouth daily.     Marland Kitchen METFORMIN HCL 1000 MG PO TABS Oral Take 1,000 mg by mouth 2 (two) times daily.     Marland Kitchen MIRTAZAPINE 45 MG PO TABS Oral Take 45 mg by mouth at bedtime.     Marland Kitchen SIMVASTATIN 40 MG PO TABS Oral Take 40 mg by mouth at bedtime.     . TOLNAFTATE 1 % EX AERP Topical Apply 1 spray topically 2 (two) times daily. For cracking/itching of feet      BP 138/106  Pulse 106  Temp 99.7 F (37.6 C) (Oral)  Resp 22  SpO2 99%  Physical Exam  Nursing note and vitals reviewed. Constitutional: She appears well-developed and well-nourished. No distress.  HENT:  Head: Normocephalic and atraumatic.  Right Ear: External ear normal.  Left Ear: External ear normal.       No oral lesions noted, mucous membranes slightly dry  Eyes: Conjunctivae are normal. Right eye exhibits no discharge. Left eye exhibits no discharge. No scleral icterus.  Neck: Neck supple. No tracheal deviation present.  Cardiovascular: Normal rate, regular rhythm and intact distal pulses.   Pulmonary/Chest: Effort normal and breath sounds normal. No stridor. No respiratory distress. She has no wheezes. She has no rales.       Able to speak in full sentences without any difficulty  Abdominal: Soft. Bowel sounds are normal. She exhibits no distension. There is no tenderness. There is no rebound and no guarding.  Musculoskeletal: She exhibits no edema and no tenderness.  Neurological: She is alert. She has normal strength. No sensory deficit. Cranial nerve deficit:  no gross defecits noted. She exhibits normal muscle tone. She displays no seizure activity. Coordination normal.  Skin: Skin is warm and dry. No rash noted.  Psychiatric: She has a normal mood and affect.    ED Course  Procedures (including critical care time)  EKG interp: Sinus tachycardia, rate 105 Nl axis Septal infarct , age  undetermined Abnormal ECG  Labs Reviewed  CBC -  Abnormal; Notable for the following:    WBC 10.6 (*)     All other components within normal limits  BASIC METABOLIC PANEL - Abnormal; Notable for the following:    Sodium 132 (*)     Chloride 93 (*)     Glucose, Bld 196 (*)     GFR calc non Af Amer 57 (*)     GFR calc Af Amer 66 (*)     All other components within normal limits  POCT I-STAT TROPONIN I   Dg Chest 2 View  09/25/2011  *RADIOLOGY REPORT*  Clinical Data: Cough, congestion, nausea, vomiting, chest pain, fever, body aches, history diabetes, hypertension, asthma, COPD, smoking  CHEST - 2 VIEW  Comparison: None  Findings: Normal heart size, mediastinal contours, and pulmonary vascularity. Mild peribronchial thickening. No pulmonary infiltrate, pleural effusion, or pneumothorax. Bones unremarkable.  IMPRESSION: Mild bronchitic changes.  Original Report Authenticated By: Lollie Marrow, M.D.      MDM  The patient does not have evidence of pneumonia. She is not currently wheezing in the emergency department. I doubt pulmonary embolism or cardiac etiology. I suspect her symptoms are related to a bronchitis type infection. I have encouraged her to followup with her primary care doctor if the symptoms do not improve as expected and to return to the ED for worsening symptoms.        Celene Kras, MD 09/25/11 [phone]

## 2011-09-25 NOTE — ED Notes (Signed)
Patient to [facility] Ed with C/O cough, chest pain, muscle aches.  She states that she wanted to be sure that she did not have pneumonia, so she came to the ED to be checked out.

## 2011-09-25 NOTE — ED Notes (Signed)
Updated in w/r 

## 2011-09-25 NOTE — ED Notes (Signed)
Report given wait time  Blanket given

## 2011-09-25 NOTE — ED Notes (Signed)
Seen by PCP 1 week ago and taking antibiotic for abd pain, nausea, and vomiting.  States symptoms got better but started having pain in center of chest and L ribs x 2 days.  Reports sob.  States she had productive cough with yellow sputum 2 days ago but now cough is dry.

## 2011-09-25 NOTE — Discharge Instructions (Signed)
Bronchitis Bronchitis is a problem of the air tubes leading to your lungs. This problem makes it hard for air to get in and out of the lungs. You may cough a lot because your air tubes are narrow. Going without care can cause lasting (chronic) bronchitis. HOME CARE   Drink enough fluids to keep your pee (urine) clear or pale yellow.   Use a cool mist humidifier.   Quit smoking if you smoke. If you keep smoking, the bronchitis might not get better.   Only take medicine as told by your doctor.  GET HELP RIGHT AWAY IF:   Coughing keeps you awake.   You start to wheeze.   You become more sick or weak.   You have a hard time breathing or get short of breath.   You cough up blood.   Coughing lasts more than 2 weeks.   You have a fever.   Your baby is older than 3 months with a rectal temperature of 102 F (38.9 C) or higher.   Your baby is 3 months old or younger with a rectal temperature of 100.4 F (38 C) or higher.  MAKE SURE YOU:  Understand these instructions.   Will watch your condition.   Will get help right away if you are not doing well or get worse.  Document Released: 09/02/2007 Document Revised: 03/05/2011 Document Reviewed: 02/15/2009 Exit[employer] Patient Information 2012 [employer]. 

## 2011-10-29 ENCOUNTER — Encounter: Payer: Self-pay | Admitting: Pulmonary Disease

## 2011-10-29 ENCOUNTER — Ambulatory Visit (INDEPENDENT_AMBULATORY_CARE_PROVIDER_SITE_OTHER): Payer: Medicaid Other | Admitting: Pulmonary Disease

## 2011-10-29 VITALS — BP 128/68 | HR 103 | Temp 98.3°F | Ht 69.0 in | Wt 227.2 lb

## 2011-10-29 DIAGNOSIS — G4733 Obstructive sleep apnea (adult) (pediatric): Secondary | ICD-10-CM

## 2011-10-29 NOTE — Progress Notes (Signed)
  Subjective:    Patient ID: Vanessa [name], female    DOB: 03-Apr-1955, 56 y.o.   MRN: [id number]  HPI The patient comes in today for followup of her known obstructive sleep apnea.  At the last visit, she was having issues with significant mask leak, and we were unable to optimize her pressure.  I had sent an order to her DME to get her a better fitting mask, and also work on pressure optimization.  She did receive a new mass that continues to leak and does not fit well.  She tells me that her auto device  was never downloaded.  She continues to have significant leak and sleep disruption.  She does not feel that her pressure is adjusted appropriately, and that she will often have smothering trying to wear the device.  She is also having a lot of issues with upper airway coughing at night, and states that she is having reflux and also dysphasia whenever she eats.  It should be noted the patient is on an ACE inhibitor.   Review of Systems  Constitutional: Negative for fever and unexpected weight change.  HENT: Positive for nosebleeds and congestion. Negative for ear pain, sore throat, rhinorrhea, sneezing, trouble swallowing, dental problem, postnasal drip and sinus pressure.   Eyes: Negative for redness and itching.  Respiratory: Positive for cough and shortness of breath. Negative for chest tightness and wheezing.   Cardiovascular: Negative for palpitations and leg swelling.  Gastrointestinal: Negative for nausea and vomiting.  Genitourinary: Negative for dysuria.  Musculoskeletal: Negative for joint swelling.  Skin: Negative for rash.  Neurological: Negative for headaches.  Hematological: Does not bruise/bleed easily.  Psychiatric/Behavioral: Negative for dysphoric mood. The patient is not nervous/anxious.   All other systems reviewed and are negative.       Objective:   Physical Exam Obese female in no acute distress No skin breakdown or pressure necrosis from the CPAP mask Nose  without purulence or discharge noted Chest clear to auscultation, no wheezing Lower extremities without significant edema, no cyanosis Alert, does not appear to be sleepy, moves all 4 extremities.       Assessment & Plan:

## 2011-10-29 NOTE — Patient Instructions (Addendum)
Will send you to the sleep center to find your optimal pressure, and get fitted properly for a mask Stop lisinopril, and start benicar 20mg  1/2 tab (cut in half) each day until you can see your primary md  Trial of dexilant 60mg  one a day to see if your choking while eating will get better.  You need to discuss this with your primary md as well. Work on [employer] loss Will call you once sleep test results are available.

## 2011-10-29 NOTE — Assessment & Plan Note (Signed)
The patient continues to struggle with CPAP tolerance, and not only has a poorly fitting mask, but we have yet to optimize her pressure.  I think at this point we need to get her back to the sleep center for a formal titration.  This will allow pressure optimization, and will also allow for a better fitting mask.  Regarding her complaint of cough, this is clearly an upper airway issue and probably related to reflux and her ACE inhibitor.  I will give her samples of a proton pump inhibitor, and also change her ACE to an ARB, but she is to get with her primary care physician to discuss these issues.

## 2011-11-22 ENCOUNTER — Ambulatory Visit (HOSPITAL_BASED_OUTPATIENT_CLINIC_OR_DEPARTMENT_OTHER): Payer: Medicaid Other | Attending: Pulmonary Disease

## 2011-11-22 VITALS — Ht 69.0 in | Wt 301.0 lb

## 2011-11-22 DIAGNOSIS — G4733 Obstructive sleep apnea (adult) (pediatric): Secondary | ICD-10-CM | POA: Insufficient documentation

## 2011-11-22 DIAGNOSIS — Z9989 Dependence on other enabling machines and devices: Secondary | ICD-10-CM

## 2011-11-29 DIAGNOSIS — G4710 Hypersomnia, unspecified: Secondary | ICD-10-CM

## 2011-11-29 DIAGNOSIS — G4730 Sleep apnea, unspecified: Secondary | ICD-10-CM

## 2011-11-30 NOTE — Procedures (Signed)
Vanessa [name], Vanessa [name]           ACCOUNT NO.:  [id number]  MEDICAL RECORD NO.:  [id number]          PATIENT TYPE:  OUT  LOCATION:  SLEEP CENTER                 FACILITY:  [facility]  PHYSICIAN:  Barbaraann Share, MD,FCCPDATE OF BIRTH:  03-19-58  DATE OF STUDY:  11/22/2011                           NOCTURNAL POLYSOMNOGRAM  REFERRING PHYSICIAN:  Barbaraann Share, MD,FCCP  INDICATION FOR STUDY:  Hypersomnia with sleep apnea.  EPWORTH SLEEPINESS SCORE:  Five.  SLEEP ARCHITECTURE:  The patient had a total sleep time of 315 minutes with no slow-wave sleep and only 65 minutes of REM.  Sleep onset latency was immediate, and REM onset was prolonged at 139 minutes.  Sleep efficiency was mildly reduced at 87%.  MEDICATIONS:  RESPIRATORY DATA:  The patient underwent a CPAP titration study, where she was fitted with a medium ResMed with FX nasal pillow mask.  CPAP titration pressure was started at 5 cm of water and gradually increased to 10 cm.  She had fairly good control on this pressure, however, she was increased to 11 cm at the very end of the study for occasional breakthrough snoring.  OXYGEN DATA:  There was O2 desaturation as low as 86% with the patient's obstructive events.  CARDIAC DATA:  No clinically significant arrhythmias were noted.  MOVEMENTS/PARASOMNIA:  The patient had no significant leg jerks or other abnormal behaviors seen.  IMPRESSION-RECOMMENDATIONS:  Good control of previously documented obstructive sleep apnea with a medium ResMed with FX nasal pillows mask, and set on a pressure of 10 cm of water.  The patient should also be encouraged to work aggressively on weight loss.     Barbaraann Share, MD,FCCP Diplomate, American Board of Sleep Medicine    [facility]MED[facility]  D:  11/29/2011 15:27:20  T:  11/30/2011 02:56:13  Job:  [id number]

## 2011-12-01 ENCOUNTER — Other Ambulatory Visit: Payer: Self-pay | Admitting: Pulmonary Disease

## 2011-12-01 DIAGNOSIS — G4733 Obstructive sleep apnea (adult) (pediatric): Secondary | ICD-10-CM

## 2011-12-10 ENCOUNTER — Telehealth: Payer: Self-pay | Admitting: Pulmonary Disease

## 2011-12-10 DIAGNOSIS — G4733 Obstructive sleep apnea (adult) (pediatric): Secondary | ICD-10-CM

## 2011-12-10 NOTE — Telephone Encounter (Signed)
ATC, NA and no option to leave msg, WCB

## 2011-12-11 NOTE — Telephone Encounter (Signed)
LMTCB

## 2011-12-13 ENCOUNTER — Emergency Department (HOSPITAL_COMMUNITY)
Admission: EM | Admit: 2011-12-13 | Discharge: 2011-12-14 | Disposition: A | Discharge status: Home / Self Care | Payer: Medicaid Other | Attending: Emergency Medicine | Admitting: Emergency Medicine

## 2011-12-13 ENCOUNTER — Emergency Department (HOSPITAL_COMMUNITY): Payer: Medicaid Other

## 2011-12-13 ENCOUNTER — Encounter (HOSPITAL_COMMUNITY): Payer: Self-pay | Admitting: *Deleted

## 2011-12-13 DIAGNOSIS — F17200 Nicotine dependence, unspecified, uncomplicated: Secondary | ICD-10-CM | POA: Insufficient documentation

## 2011-12-13 DIAGNOSIS — J4489 Other specified chronic obstructive pulmonary disease: Secondary | ICD-10-CM | POA: Insufficient documentation

## 2011-12-13 DIAGNOSIS — E119 Type 2 diabetes mellitus without complications: Secondary | ICD-10-CM | POA: Insufficient documentation

## 2011-12-13 DIAGNOSIS — J209 Acute bronchitis, unspecified: Secondary | ICD-10-CM

## 2011-12-13 DIAGNOSIS — G8929 Other chronic pain: Secondary | ICD-10-CM | POA: Insufficient documentation

## 2011-12-13 DIAGNOSIS — T148XXA Other injury of unspecified body region, initial encounter: Secondary | ICD-10-CM

## 2011-12-13 DIAGNOSIS — Z716 Tobacco abuse counseling: Secondary | ICD-10-CM

## 2011-12-13 DIAGNOSIS — J449 Chronic obstructive pulmonary disease, unspecified: Secondary | ICD-10-CM | POA: Insufficient documentation

## 2011-12-13 DIAGNOSIS — G4733 Obstructive sleep apnea (adult) (pediatric): Secondary | ICD-10-CM | POA: Insufficient documentation

## 2011-12-13 DIAGNOSIS — E785 Hyperlipidemia, unspecified: Secondary | ICD-10-CM | POA: Insufficient documentation

## 2011-12-13 DIAGNOSIS — I10 Essential (primary) hypertension: Secondary | ICD-10-CM | POA: Insufficient documentation

## 2011-12-13 LAB — CBC WITH DIFFERENTIAL/PLATELET
Basophils Relative: 0 % (ref 0–1)
Eosinophils Absolute: 0.2 10*3/uL (ref 0.0–0.7)
HCT: 36.6 % (ref 36.0–46.0)
Hemoglobin: 12.1 g/dL (ref 12.0–15.0)
MCH: 29.2 pg (ref 26.0–34.0)
MCHC: 33.1 g/dL (ref 30.0–36.0)
Monocytes Absolute: 0.7 10*3/uL (ref 0.1–1.0)
Monocytes Relative: 6 % (ref 3–12)

## 2011-12-13 LAB — BASIC METABOLIC PANEL
BUN: 6 mg/dL (ref 6–23)
CO2: 32 mEq/L (ref 19–32)
Chloride: 97 mEq/L (ref 96–112)
Glucose, Bld: 144 mg/dL — ABNORMAL HIGH (ref 70–99)
Potassium: 3.7 mEq/L (ref 3.5–5.1)

## 2011-12-13 LAB — URINALYSIS, ROUTINE W REFLEX MICROSCOPIC
Bilirubin Urine: NEGATIVE
Glucose, UA: NEGATIVE mg/dL
Hgb urine dipstick: NEGATIVE
Protein, ur: NEGATIVE mg/dL
Specific Gravity, Urine: 1.008 (ref 1.005–1.030)
Urobilinogen, UA: 0.2 mg/dL (ref 0.0–1.0)

## 2011-12-13 MED ORDER — IBUPROFEN 200 MG PO TABS
600.0000 mg | ORAL_TABLET | Freq: Once | ORAL | Status: AC
  Administered 2011-12-13: 600 mg via ORAL
  Filled 2011-12-13: qty 3

## 2011-12-13 MED ORDER — ALBUTEROL SULFATE (5 MG/ML) 0.5% IN NEBU
2.5000 mg | INHALATION_SOLUTION | Freq: Once | RESPIRATORY_TRACT | Status: AC
  Administered 2011-12-13: 2.5 mg via RESPIRATORY_TRACT
  Filled 2011-12-13: qty 0.5

## 2011-12-13 MED ORDER — IPRATROPIUM BROMIDE 0.02 % IN SOLN
0.5000 mg | Freq: Once | RESPIRATORY_TRACT | Status: AC
  Administered 2011-12-13: 0.5 mg via RESPIRATORY_TRACT
  Filled 2011-12-13: qty 2.5

## 2011-12-13 NOTE — ED Notes (Signed)
Xray ready for pt, pt to b/r.

## 2011-12-13 NOTE — ED Notes (Signed)
C/o L axillary side/rib pain, onset after coughing 2 weeks ago,  "thinks she pulled a rib or muscle", ongoing for 2 weeks, speaking in short sentences, increased wob, NAD, some cough,  Also mentions developing sore throat and R ear ache. Pt smokes, h/o emphysema. Low grade fever noted. Has been taking tylenol (last at ~ 2000). LS: crackes in posterior base on R)

## 2011-12-14 MED ORDER — AZITHROMYCIN 250 MG PO TABS: ORAL_TABLET | ORAL | Status: AC

## 2011-12-14 NOTE — Telephone Encounter (Signed)
LMTCB

## 2011-12-14 NOTE — Telephone Encounter (Signed)
[id number] on cell number, ATC home number NA, no voicemail. WCB. Carron Curie, CMA

## 2011-12-14 NOTE — ED Provider Notes (Signed)
History     CSN: [id number]  Arrival date & time 12/13/11  2017   First MD Initiated Contact with Patient 12/14/11 0011      Muscle pain  (Consider location/radiation/quality/duration/timing/severity/associated sxs/prior treatment) HPI this patient is a 56 -year-old woman with multiple chronic medical problems including diabetes, hypertension and COPD. She smokes one pack per day. She has had a productive cough for the past 2 weeks. She presents today because she has aching pain in the left flank with coughing and certain movements of the torso. She is pain-free at rest. She denies fever as well as shortness of breath. She has had wheezing which is controlled with albuterol. Her pain is a maximum of 8 on a 0-to-10 scale. While still in bed, her pain is 0.  The patient denies GI and genitourinary symptoms. She has been taking hydrocodone for pain which has been prescribed by her primary care physician. Status has been adequate in managing her pain. The patient says she came to the emergency department seeking a diagnosis for the cause of her pain. She was last seen by her primary care provider, [facility] September 6.  Past Medical History  Diagnosis Date  . Hypertension   . Heart murmur   . Asthma   . Emphysema   . Diabetes mellitus   . Hyperlipidemia   . Allergic rhinitis   . Chronic headache   . OSA (obstructive sleep apnea)   . Neuropathy   . Grave's disease   . Herniated disc   . Arthritis   . COPD (chronic obstructive pulmonary disease)   . Sleep apnea   . Menopause   . Hernia, umbilical   . Anxiety   . Depression   . Back pain, chronic   . Shingles   . Herniated disc     Past Surgical History  Procedure Date  . Cholecystectomy   . Tubal ligation   . Laparoscopy   . Foot surgery   . Eye surgery     Family History  Problem Relation Age of Onset  . Emphysema Mother   . Allergies Mother   . Allergies Daughter   . Asthma Mother   . Asthma Brother   . Heart  disease Mother   . Rheum arthritis Mother   . Breast cancer Other     aunt  . Lung cancer Brother   . Throat cancer Brother     History  Substance Use Topics  . Smoking status: Current Every Day Smoker -- 1.0 packs/day for 40 years    Types: Cigarettes  . Smokeless tobacco: Not on file  . Alcohol Use: No    OB History    Grav Para Term Preterm Abortions TAB SAB Ect Mult Living   5 3   2  2   3       Review of Systems Gen: no weight loss, fevers, chills, night sweats Eyes: no discharge or drainage, no occular pain or visual changes Nose: no epistaxis or rhinorrhea Mouth: no dental pain, no sore throat Neck: no neck pain Lungs: As per history of present illness, otherwise negative CV: no chest pain, palpitations, dependent edema or orthopnea Abd: no abdominal pain, nausea, vomiting GU: no dysuria or gross hematuria MSK: no myalgias or arthralgias Neuro: no headache, no focal neurologic deficits Skin: no rash Psyche: negative.  Allergies  Ciprofloxacin and Penicillins  Home Medications   Current Outpatient Rx  Name Route Sig Dispense Refill  . ACYCLOVIR 400 MG PO TABS Oral Take  800 mg by mouth daily.    . ALBUTEROL SULFATE HFA 108 (90 BASE) MCG/ACT IN AERS Inhalation Inhale 2 puffs into the lungs every 6 (six) hours as needed. For COPD    . BECLOMETHASONE DIPROPIONATE 80 MCG/ACT IN AERS Inhalation Inhale 2 puffs into the lungs every morning.     . BUSPIRONE HCL 30 MG PO TABS Oral Take 30 mg by mouth 2 (two) times daily.      Marland Kitchen CLOTRIMAZOLE-BETAMETHASONE 1-0.05 % EX CREA Topical Apply 1 application topically 2 (two) times daily.    . CYCLOBENZAPRINE HCL 10 MG PO TABS Oral Take 10 mg by mouth 2 (two) times daily as needed. For muscle spasm    . DICLOFENAC SODIUM 50 MG PO TBEC Oral Take 50 mg by mouth 2 (two) times daily.    Marland Kitchen DOCUSATE SODIUM 100 MG PO CAPS Oral Take 200 mg by mouth daily.    Marland Kitchen FLUOXETINE HCL 20 MG PO CAPS Oral Take 60 mg by mouth every morning.     Marland Kitchen  GABAPENTIN 800 MG PO TABS Oral Take 800 mg by mouth 3 (three) times daily as needed. For nerve pain    . HYDROCHLOROTHIAZIDE 25 MG PO TABS Oral Take 25 mg by mouth daily.    Marland Kitchen HYDROCODONE-ACETAMINOPHEN 10-325 MG PO TABS Oral Take 1 tablet by mouth every 6 (six) hours as needed. For pain    . IPRATROPIUM-ALBUTEROL 0.5-2.5 (3) MG/3ML IN SOLN Nebulization Take 3 mLs by nebulization 4 (four) times daily as needed. For COPD    . LEVOTHYROXINE SODIUM 175 MCG PO TABS Oral Take 175 mcg by mouth daily.    Marland Kitchen LIDOCAINE 5 % EX PTCH Transdermal Place 1 patch onto the skin daily as needed. Remove & Discard patch within 12 hours or as directed by MD    . MAGNESIUM HYDROXIDE 400 MG/5ML PO SUSP Oral Take 30 mLs by mouth daily as needed. For constipation    . METFORMIN HCL 1000 MG PO TABS Oral Take 1,000 mg by mouth 2 (two) times daily.     Marland Kitchen MIRTAZAPINE 45 MG PO TABS Oral Take 45 mg by mouth at bedtime as needed. For insomnia    . PANTOPRAZOLE SODIUM 40 MG PO TBEC Oral Take 40 mg by mouth daily.    Marland Kitchen SIMVASTATIN 40 MG PO TABS Oral Take 40 mg by mouth at bedtime.       BP 122/77  Pulse 93  Temp 98.8 F (37.1 C) (Oral)  Resp 20  SpO2 97%  Physical Exam Gen: well developed and obese, no distress Head: NCAT Eyes: PERL, EOMI Nose: no epistaixis or rhinorrhea Mouth/throat: mucosa is moist and pink Neck: supple, no stridor Lungs: CTA B, no wheezing, rhonchi or rales Abd: soft, obese, mild ttp on palpation of upper left flank, no splenomegaly appreciated. No peritoneal signs, no masses appreciated. Back: no ttp, no cva ttp Skin: no rashese, wnl Neuro: CN ii-xii grossly intact, no focal deficits Psyche; flat affect,  calm and cooperative.   ED Course  Procedures (including critical care time)  Labs Reviewed  BASIC METABOLIC PANEL - Abnormal; Notable for the following:    Glucose, Bld 144 (*)     GFR calc non Af Amer 79 (*)     All other components within normal limits  CBC WITH DIFFERENTIAL -  Abnormal; Notable for the following:    WBC 11.4 (*)     All other components within normal limits  URINALYSIS, ROUTINE W REFLEX MICROSCOPIC  Dg Chest 2 View  12/13/2011  *RADIOLOGY REPORT*  Clinical Data: Cough, congestion and fever.  CHEST - 2 VIEW  Comparison: PA and lateral chest 09/25/2011.  Findings: There is some peribronchial thickening.  No consolidative process, pneumothorax or effusion.  Heart size is normal.  IMPRESSION: Bronchitic change without focal process.   Original Report Authenticated By: Bernadene Bell. Maricela Curet, M.D.      No diagnosis found.  Ddx: pna, ptx, pleural effusion, muscle strain, bronchitis, splenomegaly    MDM  Patient has acute bronchitis and, in light of her history of COPD and 1ppd smoking habit, will benefit from abx tx. She is nontoxic appearing with normal VS on my exam, including a pulse of 88, RR 20, sats 98% on RA and normal temp. CXR supports bronchitis and does not show any evidence of ptx or pna or pleural effusion. Patient is stable for d/c with counsel on the importance of immediate smoking cessation. She is counseled to call Dr. Ginette Otto office later in the day to schedule an appt in the next couple of days. Counseled to return to the ED for red flag sx.         Brandt Loosen, MD 12/14/11 [phone]

## 2011-12-14 NOTE — Telephone Encounter (Signed)
Pt returned call.  Vanessa [name] ° °

## 2011-12-15 NOTE — Telephone Encounter (Signed)
Per order on 12/01/11 an order was sent to [facility] for new cpap and supplies. LMOMTCB x 1 so we may check to see if the pt has received the new machine.

## 2011-12-16 NOTE — Telephone Encounter (Signed)
lmomtcb x2 on both #'s 

## 2011-12-17 NOTE — Telephone Encounter (Signed)
lmomtcb x3 on both #'s again

## 2011-12-18 NOTE — Telephone Encounter (Signed)
Order placed for new CPAP machine and supplies thru [facility].

## 2011-12-18 NOTE — Telephone Encounter (Signed)
Order was placed 12/01/11 for this. Please advise LIBBY thanks

## 2011-12-18 NOTE — Telephone Encounter (Signed)
i need a new order put inf for pt to get new cpap and supplies yhanks Tobe Sos

## 2011-12-18 NOTE — Telephone Encounter (Signed)
Left detailed message on pt's VM informing her that the order has been replaced for her CPAP and supplies and that she if she does not hear anything from DME w/in the few days to call the office back.  Will sign off.

## 2011-12-18 NOTE — Telephone Encounter (Signed)
The order was not for a new cpap just to set the pressure i need an order for new cpap and supplies thanks Tobe Sos

## 2011-12-18 NOTE — Telephone Encounter (Signed)
Called, spoke with pt who states she hasn't received the new machine and supplies through [facility].  Per our records, we have faxed order to [facility].  PCC[facility], will you pls check on this?  Thank you.

## 2012-01-06 ENCOUNTER — Emergency Department (HOSPITAL_COMMUNITY)
Admission: EM | Admit: 2012-01-06 | Discharge: 2012-01-06 | Disposition: A | Discharge status: Home / Self Care | Payer: Medicaid Other | Attending: Emergency Medicine | Admitting: Emergency Medicine

## 2012-01-06 ENCOUNTER — Emergency Department (HOSPITAL_COMMUNITY): Payer: Medicaid Other

## 2012-01-06 ENCOUNTER — Encounter (HOSPITAL_COMMUNITY): Payer: Self-pay

## 2012-01-06 DIAGNOSIS — Z9089 Acquired absence of other organs: Secondary | ICD-10-CM | POA: Insufficient documentation

## 2012-01-06 DIAGNOSIS — E0500 Thyrotoxicosis with diffuse goiter without thyrotoxic crisis or storm: Secondary | ICD-10-CM | POA: Insufficient documentation

## 2012-01-06 DIAGNOSIS — S058X9A Other injuries of unspecified eye and orbit, initial encounter: Secondary | ICD-10-CM | POA: Insufficient documentation

## 2012-01-06 DIAGNOSIS — Z79899 Other long term (current) drug therapy: Secondary | ICD-10-CM | POA: Insufficient documentation

## 2012-01-06 DIAGNOSIS — X58XXXA Exposure to other specified factors, initial encounter: Secondary | ICD-10-CM | POA: Insufficient documentation

## 2012-01-06 DIAGNOSIS — J449 Chronic obstructive pulmonary disease, unspecified: Secondary | ICD-10-CM | POA: Insufficient documentation

## 2012-01-06 DIAGNOSIS — I10 Essential (primary) hypertension: Secondary | ICD-10-CM | POA: Insufficient documentation

## 2012-01-06 DIAGNOSIS — J4489 Other specified chronic obstructive pulmonary disease: Secondary | ICD-10-CM | POA: Insufficient documentation

## 2012-01-06 DIAGNOSIS — H169 Unspecified keratitis: Secondary | ICD-10-CM | POA: Insufficient documentation

## 2012-01-06 LAB — COMPREHENSIVE METABOLIC PANEL
ALT: 17 U/L (ref 0–35)
Alkaline Phosphatase: 64 U/L (ref 39–117)
BUN: 10 mg/dL (ref 6–23)
CO2: 31 mEq/L (ref 19–32)
Chloride: 98 mEq/L (ref 96–112)
GFR calc Af Amer: 84 mL/min — ABNORMAL LOW (ref >90)
GFR calc non Af Amer: 72 mL/min — ABNORMAL LOW (ref >90)
Glucose, Bld: 123 mg/dL — ABNORMAL HIGH (ref 70–99)
Potassium: 4.2 mEq/L (ref 3.5–5.1)
Sodium: 135 mEq/L (ref 135–145)
Total Bilirubin: 0.2 mg/dL — ABNORMAL LOW (ref 0.3–1.2)

## 2012-01-06 LAB — TSH: TSH: 0.534 u[IU]/mL (ref 0.350–4.500)

## 2012-01-06 LAB — CBC WITH DIFFERENTIAL/PLATELET
Basophils Absolute: 0 10*3/uL (ref 0.0–0.1)
Eosinophils Relative: 2 % (ref 0–5)
HCT: 34.3 % — ABNORMAL LOW (ref 36.0–46.0)
Hemoglobin: 11.5 g/dL — ABNORMAL LOW (ref 12.0–15.0)
Lymphocytes Relative: 16 % (ref 12–46)
MCV: 87.1 fL (ref 78.0–100.0)
Monocytes Absolute: 0.7 10*3/uL (ref 0.1–1.0)
Monocytes Relative: 5 % (ref 3–12)
RDW: 14.2 % (ref 11.5–15.5)
WBC: 13 10*3/uL — ABNORMAL HIGH (ref 4.0–10.5)

## 2012-01-06 MED ORDER — FLUORESCEIN SODIUM 1 MG OP STRP
1.0000 | ORAL_STRIP | Freq: Once | OPHTHALMIC | Status: AC
  Administered 2012-01-06: 13:00:00 via OPHTHALMIC
  Filled 2012-01-06: qty 1

## 2012-01-06 MED ORDER — PROPARACAINE HCL 0.5 % OP SOLN
1.0000 [drp] | Freq: Once | OPHTHALMIC | Status: AC
  Administered 2012-01-06: 1 [drp] via OPHTHALMIC
  Filled 2012-01-06: qty 15

## 2012-01-06 MED ORDER — POLYMYXIN B-TRIMETHOPRIM 10000-0.1 UNIT/ML-% OP SOLN
1.0000 [drp] | OPHTHALMIC | Status: DC
  Administered 2012-01-06: 1 [drp] via OPHTHALMIC
  Filled 2012-01-06: qty 10

## 2012-01-06 MED ORDER — OXYCODONE-ACETAMINOPHEN 5-325 MG PO TABS
1.0000 | ORAL_TABLET | Freq: Once | ORAL | Status: AC
  Administered 2012-01-06: 1 via ORAL
  Filled 2012-01-06: qty 1

## 2012-01-06 NOTE — ED Notes (Signed)
cbg 186  

## 2012-01-06 NOTE — ED Provider Notes (Signed)
History     CSN: [id number]  Arrival date & time 01/06/12  4098   First MD Initiated Contact with Patient 01/06/12 1021      Chief Complaint  Patient presents with  . Facial Swelling  . Eye Pain  . Headache  . Foreign Body in Eye    (Consider location/radiation/quality/duration/timing/severity/associated sxs/prior treatment) HPI  56 year old female with history of Graves' disease, diabetes, and allergic rhinitis presents complaining of eye pain. Does reports for the past 2 days she has been experiencing eye discomfort. Sxs startswith the left eye. She noticed a sensation of swelling to the eyelid and a sensation of fb "something in my eye".  Yesterday the lid swelling were present in the right eye as well. Report both eyes are swollen and painful. Describe pain as a sharp and burning sensation. There is some photophobia. Eyes teary. She also notices a "whooshing sound" to both ears for the past few days.  She denies fever, headache, eyes pustular discharge, double vision, vision changes, , runny nose sore throat. Denies any specific injury. She does not wear contact lens. Patient recently moved to [facility].  Does have a PCP Has levothyroxine listed in her medication list, and taking it regularly.   Son noticed R eye protrude more than usual, and pt is slurring her speech.    Past Medical History  Diagnosis Date  . Hypertension   . Heart murmur   . Asthma   . Emphysema   . Diabetes mellitus   . Hyperlipidemia   . Allergic rhinitis   . Chronic headache   . OSA (obstructive sleep apnea)   . Neuropathy   . Grave's disease   . Herniated disc   . Arthritis   . COPD (chronic obstructive pulmonary disease)   . Sleep apnea   . Menopause   . Hernia, umbilical   . Anxiety   . Depression   . Back pain, chronic   . Shingles   . Herniated disc     Past Surgical History  Procedure Date  . Cholecystectomy   . Tubal ligation   . Laparoscopy   . Foot surgery   . Eye surgery      Family History  Problem Relation Age of Onset  . Emphysema Mother   . Allergies Mother   . Allergies Daughter   . Asthma Mother   . Asthma Brother   . Heart disease Mother   . Rheum arthritis Mother   . Breast cancer Other     aunt  . Lung cancer Brother   . Throat cancer Brother     History  Substance Use Topics  . Smoking status: Current Every Day Smoker -- 1.0 packs/day for 40 years    Types: Cigarettes  . Smokeless tobacco: Not on file  . Alcohol Use: No    OB History    Grav Para Term Preterm Abortions TAB SAB Ect Mult Living   5 3   2  2   3       Review of Systems  Constitutional: Negative for fever.  Eyes: Positive for photophobia and pain. Negative for discharge, redness, itching and visual disturbance.  Skin: Negative for rash.  Neurological: Negative for numbness and headaches.    Allergies  Ciprofloxacin and Penicillins  Home Medications   Current Outpatient Rx  Name Route Sig Dispense Refill  . ACYCLOVIR 400 MG PO TABS Oral Take 800 mg by mouth daily.    . ALBUTEROL SULFATE HFA 108 (90 BASE) MCG/ACT  IN AERS Inhalation Inhale 2 puffs into the lungs every 6 (six) hours as needed. For COPD    . BECLOMETHASONE DIPROPIONATE 80 MCG/ACT IN AERS Inhalation Inhale 2 puffs into the lungs every morning.     . BUSPIRONE HCL 30 MG PO TABS Oral Take 30 mg by mouth 2 (two) times daily.      Marland Kitchen CLOTRIMAZOLE-BETAMETHASONE 1-0.05 % EX CREA Topical Apply 1 application topically 2 (two) times daily.    Marland Kitchen DICLOFENAC SODIUM 50 MG PO TBEC Oral Take 50 mg by mouth 2 (two) times daily.    Marland Kitchen DOCUSATE SODIUM 100 MG PO CAPS Oral Take 200 mg by mouth daily.    Marland Kitchen FLUOXETINE HCL 20 MG PO CAPS Oral Take 60 mg by mouth every morning.     Marland Kitchen GABAPENTIN 300 MG PO CAPS Oral Take 300 mg by mouth 3 (three) times daily.    Marland Kitchen HYDROCHLOROTHIAZIDE 25 MG PO TABS Oral Take 25 mg by mouth daily.    Marland Kitchen HYDROCODONE-ACETAMINOPHEN 10-325 MG PO TABS Oral Take 1 tablet by mouth every 6 (six) hours  as needed. For pain    . IPRATROPIUM-ALBUTEROL 0.5-2.5 (3) MG/3ML IN SOLN Nebulization Take 3 mLs by nebulization 4 (four) times daily as needed. For COPD    . LEVOTHYROXINE SODIUM 175 MCG PO TABS Oral Take 175 mcg by mouth every morning.     Marland Kitchen LIDOCAINE 5 % EX PTCH Transdermal Place 1 patch onto the skin daily as needed. Remove & Discard patch within 12 hours or as directed by MD    . LISINOPRIL 5 MG PO TABS Oral Take 5 mg by mouth every morning.    Marland Kitchen MAGNESIUM HYDROXIDE 400 MG/5ML PO SUSP Oral Take 30 mLs by mouth daily as needed. For constipation    . METFORMIN HCL 1000 MG PO TABS Oral Take 1,000 mg by mouth 2 (two) times daily.     Marland Kitchen MIRTAZAPINE 45 MG PO TABS Oral Take 45 mg by mouth at bedtime as needed. For insomnia    . PANTOPRAZOLE SODIUM 40 MG PO TBEC Oral Take 40 mg by mouth daily.    Marland Kitchen SIMVASTATIN 40 MG PO TABS Oral Take 40 mg by mouth at bedtime.       BP 128/71  Pulse 105  Temp 99.2 F (37.3 C) (Oral)  Resp 22  Ht 5\' 9"  (1.753 m)  Wt 250 lb (113.399 kg)  BMI 36.92 kg/m2  SpO2 93%  Physical Exam  Nursing note and vitals reviewed. Constitutional: She is oriented to person, place, and time. She appears well-developed and well-nourished.       Morbidly obese  HENT:  Head: Normocephalic and atraumatic.  Right Ear: External ear normal.  Left Ear: External ear normal.  Nose: Nose normal.  Mouth/Throat: Oropharynx is clear and moist. No oropharyngeal exudate.  Eyes: EOM and lids are normal. Pupils are equal, round, and reactive to light. No foreign bodies found. Right eye exhibits no chemosis, no discharge, no exudate and no hordeolum. No foreign body present in the right eye. Left eye exhibits no chemosis, no discharge, no exudate and no hordeolum. No foreign body present in the left eye. Right conjunctiva is injected. Right conjunctiva has no hemorrhage. Left conjunctiva is injected. Left conjunctiva has no hemorrhage. Right eye exhibits normal extraocular motion and no  nystagmus. Left eye exhibits normal extraocular motion and no nystagmus.  Slit lamp exam:      The right eye shows corneal abrasion and fluorescein uptake. The right  eye shows no corneal ulcer and no foreign body.       The left eye shows corneal abrasion and fluorescein uptake. The left eye shows no corneal ulcer and no foreign body.       R upper eyelid edema without exudates.  No fb seen.  IOP R eye range from 30-80 IOP L eye range from 30-50  Neck: Normal range of motion. Neck supple.  Cardiovascular: Normal rate and regular rhythm.   Pulmonary/Chest: Effort normal. No respiratory distress.  Abdominal: Soft. There is no tenderness.  Musculoskeletal: She exhibits edema. She exhibits no tenderness.       1+ pitting edema to pretibial region bilat.    Lymphadenopathy:    She has no cervical adenopathy.  Neurological: She is alert and oriented to person, place, and time. GCS eye subscore is 4. GCS verbal subscore is 5. GCS motor subscore is 6.       Slurring of speech but A&Ox3  Skin: Skin is warm.  Psychiatric: She has a normal mood and affect.    ED Course  Procedures (including critical care time)  Labs Reviewed  GLUCOSE, CAPILLARY - Abnormal; Notable for the following:    Glucose-Capillary 186 (*)     All other components within normal limits   Results for orders placed during the hospital encounter of 01/06/12  GLUCOSE, CAPILLARY      Component Value Range   Glucose-Capillary 186 (*) 70 - 99 mg/dL  CBC WITH DIFFERENTIAL      Component Value Range   WBC 13.0 (*) 4.0 - 10.5 K/uL   RBC 3.94  3.87 - 5.11 MIL/uL   Hemoglobin 11.5 (*) 12.0 - 15.0 g/dL   HCT 40.3 (*) 47.4 - 25.9 %   MCV 87.1  78.0 - 100.0 fL   MCH 29.2  26.0 - 34.0 pg   MCHC 33.5  30.0 - 36.0 g/dL   RDW 56.3  87.5 - 64.3 %   Platelets 244  150 - 400 K/uL   Neutrophils Relative 76  43 - 77 %   Neutro Abs 9.9 (*) 1.7 - 7.7 K/uL   Lymphocytes Relative 16  12 - 46 %   Lymphs Abs 2.1  0.7 - 4.0 K/uL    Monocytes Relative 5  3 - 12 %   Monocytes Absolute 0.7  0.1 - 1.0 K/uL   Eosinophils Relative 2  0 - 5 %   Eosinophils Absolute 0.2  0.0 - 0.7 K/uL   Basophils Relative 0  0 - 1 %   Basophils Absolute 0.0  0.0 - 0.1 K/uL   RBC Morphology POLYCHROMASIA PRESENT     Smear Review LARGE PLATELETS PRESENT    COMPREHENSIVE METABOLIC PANEL      Component Value Range   Sodium 135  135 - 145 mEq/L   Potassium 4.2  3.5 - 5.1 mEq/L   Chloride 98  96 - 112 mEq/L   CO2 31  19 - 32 mEq/L   Glucose, Bld 123 (*) 70 - 99 mg/dL   BUN 10  6 - 23 mg/dL   Creatinine, Ser 3.29  0.50 - 1.10 mg/dL   Calcium 8.3 (*) 8.4 - 10.5 mg/dL   Total Protein 6.4  6.0 - 8.3 g/dL   Albumin 3.6  3.5 - 5.2 g/dL   AST 15  0 - 37 U/L   ALT 17  0 - 35 U/L   Alkaline Phosphatase 64  39 - 117 U/L   Total Bilirubin 0.2 (*)  0.3 - 1.2 mg/dL   GFR calc non Af Amer 72 (*) >90 mL/min   GFR calc Af Amer 84 (*) >90 mL/min   Dg Chest 2 View  12/13/2011  *RADIOLOGY REPORT*  Clinical Data: Cough, congestion and fever.  CHEST - 2 VIEW  Comparison: PA and lateral chest 09/25/2011.  Findings: There is some peribronchial thickening.  No consolidative process, pneumothorax or effusion.  Heart size is normal.  IMPRESSION: Bronchitic change without focal process.   Original Report Authenticated By: Bernadene Bell. Maricela Curet, M.D.    Ct Head Wo Contrast  01/06/2012  *RADIOLOGY REPORT*  Clinical Data:  Facial swelling and eye pain.  Headache.  Foreign body in the eye.  Abnormal speech.  CT HEAD AND ORBITS WITHOUT CONTRAST  Technique:  Contiguous axial images were obtained from the base of the skull through the vertex without contrast. Multidetector CT imaging of the orbits was performed using the standard protocol without intravenous contrast.  Comparison:   None.  CT HEAD  Findings: No acute cortical infarct, hemorrhage, or mass lesion is present.  The ventricles are of normal size.  No significant extra- axial fluid collection is present.  The  paranasal sinuses and mastoid air cells are clear.  A remote right orbital blowout fracture is evident.  The osseous skull is otherwise intact.  IMPRESSION:  1.  Normal CT appearance of the brain. 2.  Remote orbital blowout fracture of the right orbit.  CT ORBITS  Findings: Bilateral exophthalmos is evident.  There is straightening of the optic nerve bilaterally.  No intraorbital mass is evident.  There is no radiopaque foreign body.  The intraorbital musculature is within normal limits.  The globes are intact.  The lenses are located.  No significant periorbital soft tissue swelling is evident.  IMPRESSION:  1.  Bilateral exophthalmos.  This is compatible with the given history of Graves' ophthalmopathy. 2.  No foreign body or focal inflammatory changes are present.   Original Report Authenticated By: Jamesetta Orleans. MATTERN, M.D.    Ct Orbitss W/o Cm  01/06/2012  *RADIOLOGY REPORT*  Clinical Data:  Facial swelling and eye pain.  Headache.  Foreign body in the eye.  Abnormal speech.  CT HEAD AND ORBITS WITHOUT CONTRAST  Technique:  Contiguous axial images were obtained from the base of the skull through the vertex without contrast. Multidetector CT imaging of the orbits was performed using the standard protocol without intravenous contrast.  Comparison:   None.  CT HEAD  Findings: No acute cortical infarct, hemorrhage, or mass lesion is present.  The ventricles are of normal size.  No significant extra- axial fluid collection is present.  The paranasal sinuses and mastoid air cells are clear.  A remote right orbital blowout fracture is evident.  The osseous skull is otherwise intact.  IMPRESSION:  1.  Normal CT appearance of the brain. 2.  Remote orbital blowout fracture of the right orbit.  CT ORBITS  Findings: Bilateral exophthalmos is evident.  There is straightening of the optic nerve bilaterally.  No intraorbital mass is evident.  There is no radiopaque foreign body.  The intraorbital musculature is within  normal limits.  The globes are intact.  The lenses are located.  No significant periorbital soft tissue swelling is evident.  IMPRESSION:  1.  Bilateral exophthalmos.  This is compatible with the given history of Graves' ophthalmopathy. 2.  No foreign body or focal inflammatory changes are present.   Original Report Authenticated By: Jamesetta Orleans. MATTERN, M.D.  1. Keratitis 2. Graves' opthalmopathy  MDM  Eye pain, upper eyelid swelling R>L with evidence of exophalmos and ptosis.  EOMI.  Her visual acuity is 20/100L eye 20/100R eye.  IOP has been >30 after multiple tries.  Pt however denies vision changes.  I suspect her exopthalmos is related to her Graves disease.  However, i will consult ophthalmology.  Discussed care with my attending.    1:29 PM I have consulted with Dr. Clarisa Kindred, opthalmologist, who request to have pt sent to her office today so she can recheck ocular pressure and to treat for keratitis.  I discussed this with pt who agrees with plan.  I also recommend pt to f/u with her PCP for further management of her Grave's disease.    BP 128/71  Pulse 105  Temp 99.2 F (37.3 C) (Oral)  Resp 22  Ht 5\' 9"  (1.753 m)  Wt 250 lb (113.399 kg)  BMI 36.92 kg/m2  SpO2 93%   time spent personally by me on the following activities: Review prior charts, imaging, and labs.  Development of treatment plan with patient and/or surrogate as well as nursing, discussions with consultants, evaluation of patient's response to treatment, examination of patient, obtaining history from patient or surrogate, ordering and performing treatments and interventions, ordering and review of laboratory studies, ordering and review of radiographic studies, pulse oximetry and re-evaluation of patient's condition.     Fayrene Helper, PA-C 01/06/12 1330

## 2012-01-06 NOTE — ED Notes (Signed)
Pt presents with no  acute distress- c/o of left eye pain started yesterday- c/o of "something in eye" awoke this am with rt eye swollen and painful.  C/o of something in rt eye.  Pt able to see only clear water draining from eye.  denies injury

## 2012-01-07 NOTE — ED Provider Notes (Signed)
Medical screening examination/treatment/procedure(s) were conducted as a shared visit with non-physician practitioner(s) and myself.  I personally evaluated the patient during the encounter   Loren Racer, MD 01/07/12 [phone]

## 2012-01-30 ENCOUNTER — Encounter (HOSPITAL_COMMUNITY): Payer: Self-pay | Admitting: *Deleted

## 2012-01-30 ENCOUNTER — Emergency Department (HOSPITAL_COMMUNITY)
Admission: EM | Admit: 2012-01-30 | Discharge: 2012-01-31 | Disposition: A | Discharge status: Home / Self Care | Payer: Medicaid Other | Attending: Emergency Medicine | Admitting: Emergency Medicine

## 2012-01-30 DIAGNOSIS — I10 Essential (primary) hypertension: Secondary | ICD-10-CM | POA: Insufficient documentation

## 2012-01-30 DIAGNOSIS — F329 Major depressive disorder, single episode, unspecified: Secondary | ICD-10-CM | POA: Insufficient documentation

## 2012-01-30 DIAGNOSIS — E119 Type 2 diabetes mellitus without complications: Secondary | ICD-10-CM | POA: Insufficient documentation

## 2012-01-30 DIAGNOSIS — E0500 Thyrotoxicosis with diffuse goiter without thyrotoxic crisis or storm: Secondary | ICD-10-CM | POA: Insufficient documentation

## 2012-01-30 DIAGNOSIS — F3289 Other specified depressive episodes: Secondary | ICD-10-CM | POA: Insufficient documentation

## 2012-01-30 DIAGNOSIS — E785 Hyperlipidemia, unspecified: Secondary | ICD-10-CM | POA: Insufficient documentation

## 2012-01-30 DIAGNOSIS — K429 Umbilical hernia without obstruction or gangrene: Secondary | ICD-10-CM | POA: Insufficient documentation

## 2012-01-30 DIAGNOSIS — G589 Mononeuropathy, unspecified: Secondary | ICD-10-CM | POA: Insufficient documentation

## 2012-01-30 DIAGNOSIS — G609 Hereditary and idiopathic neuropathy, unspecified: Secondary | ICD-10-CM | POA: Insufficient documentation

## 2012-01-30 DIAGNOSIS — M129 Arthropathy, unspecified: Secondary | ICD-10-CM | POA: Insufficient documentation

## 2012-01-30 DIAGNOSIS — F411 Generalized anxiety disorder: Secondary | ICD-10-CM | POA: Insufficient documentation

## 2012-01-30 DIAGNOSIS — F17200 Nicotine dependence, unspecified, uncomplicated: Secondary | ICD-10-CM | POA: Insufficient documentation

## 2012-01-30 DIAGNOSIS — G4733 Obstructive sleep apnea (adult) (pediatric): Secondary | ICD-10-CM | POA: Insufficient documentation

## 2012-01-30 DIAGNOSIS — Z791 Long term (current) use of non-steroidal anti-inflammatories (NSAID): Secondary | ICD-10-CM | POA: Insufficient documentation

## 2012-01-30 DIAGNOSIS — G629 Polyneuropathy, unspecified: Secondary | ICD-10-CM

## 2012-01-30 DIAGNOSIS — Z79899 Other long term (current) drug therapy: Secondary | ICD-10-CM | POA: Insufficient documentation

## 2012-01-30 DIAGNOSIS — M79673 Pain in unspecified foot: Secondary | ICD-10-CM

## 2012-01-30 DIAGNOSIS — R011 Cardiac murmur, unspecified: Secondary | ICD-10-CM | POA: Insufficient documentation

## 2012-01-30 DIAGNOSIS — J449 Chronic obstructive pulmonary disease, unspecified: Secondary | ICD-10-CM | POA: Insufficient documentation

## 2012-01-30 DIAGNOSIS — M79609 Pain in unspecified limb: Secondary | ICD-10-CM | POA: Insufficient documentation

## 2012-01-30 DIAGNOSIS — J4489 Other specified chronic obstructive pulmonary disease: Secondary | ICD-10-CM | POA: Insufficient documentation

## 2012-01-30 MED ORDER — OXYCODONE-ACETAMINOPHEN 5-325 MG PO TABS
2.0000 | ORAL_TABLET | Freq: Once | ORAL | Status: AC
  Administered 2012-01-30: 2 via ORAL
  Filled 2012-01-30: qty 2
  Filled 2012-01-30: qty 1

## 2012-01-30 NOTE — ED Provider Notes (Signed)
History     CSN: [id number]  Arrival date & time 01/30/12  2247   First MD Initiated Contact with Patient 01/30/12 2300      Chief Complaint  Patient presents with  . Foot Pain    (Consider location/radiation/quality/duration/timing/severity/associated sxs/prior treatment) HPI Comments: 56 year old female with a history of peripheral neuropathy and chronic pain presents to the emergency department I lateral foot pain increasing over the past 2 days since she ran out of pain medication. Takes percocet for her chronic pain and neuropathy. States she has a pain contract with her doctor. Admits to associated swelling in her toes. She elevated her legs for half of the day yesterday without any relief. She is requesting pain medication until she can see Dr. Roseanne Reno on Thursday. Admits to numbness on the bottom of her feet.  Patient is a 56 y.o. female presenting with lower extremity pain. The history is provided by the patient.  Foot Pain Associated symptoms include arthralgias and joint swelling. Pertinent negatives include no chest pain, chills, fatigue, headaches, nausea or vomiting.    Past Medical History  Diagnosis Date  . Hypertension   . Heart murmur   . Asthma   . Emphysema   . Diabetes mellitus   . Hyperlipidemia   . Allergic rhinitis   . Chronic headache   . OSA (obstructive sleep apnea)   . Neuropathy   . Grave's disease   . Herniated disc   . Arthritis   . COPD (chronic obstructive pulmonary disease)   . Sleep apnea   . Menopause   . Hernia, umbilical   . Anxiety   . Depression   . Back pain, chronic   . Shingles   . Herniated disc     Past Surgical History  Procedure Date  . Cholecystectomy   . Tubal ligation   . Laparoscopy   . Foot surgery   . Eye surgery     Family History  Problem Relation Age of Onset  . Emphysema Mother   . Allergies Mother   . Allergies Daughter   . Asthma Mother   . Asthma Brother   . Heart disease Mother   . Rheum  arthritis Mother   . Breast cancer Other     aunt  . Lung cancer Brother   . Throat cancer Brother     History  Substance Use Topics  . Smoking status: Current Every Day Smoker -- 1.0 packs/day for 40 years    Types: Cigarettes  . Smokeless tobacco: Not on file  . Alcohol Use: No    OB History    Grav Para Term Preterm Abortions TAB SAB Ect Mult Living   5 3   2  2   3       Review of Systems  Constitutional: Negative for chills and fatigue.  Respiratory: Negative for shortness of breath.   Cardiovascular: Negative for chest pain.  Gastrointestinal: Negative for nausea and vomiting.  Musculoskeletal: Positive for joint swelling and arthralgias.  Skin: Negative for color change.  Neurological: Negative for headaches.    Allergies  Ciprofloxacin and Penicillins  Home Medications   Current Outpatient Rx  Name Route Sig Dispense Refill  . ACYCLOVIR 400 MG PO TABS Oral Take 800 mg by mouth daily.    . ALBUTEROL SULFATE HFA 108 (90 BASE) MCG/ACT IN AERS Inhalation Inhale 2 puffs into the lungs every 6 (six) hours as needed. For COPD    . BECLOMETHASONE DIPROPIONATE 80 MCG/ACT IN AERS Inhalation Inhale  2 puffs into the lungs every morning.     . BUSPIRONE HCL 30 MG PO TABS Oral Take 30 mg by mouth 3 (three) times daily.     Marland Kitchen CARISOPRODOL 350 MG PO TABS Oral Take 350 mg by mouth 3 (three) times daily.    Marland Kitchen CLOTRIMAZOLE-BETAMETHASONE 1-0.05 % EX CREA Topical Apply 1 application topically 2 (two) times daily. For face    . DICLOFENAC SODIUM 50 MG PO TBEC Oral Take 50 mg by mouth 2 (two) times daily.    Marland Kitchen DOCUSATE SODIUM 100 MG PO CAPS Oral Take 200 mg by mouth daily.    Marland Kitchen FLUOXETINE HCL 20 MG PO CAPS Oral Take 60 mg by mouth every morning.     Marland Kitchen GABAPENTIN 300 MG PO CAPS Oral Take 600 mg by mouth 3 (three) times daily.     Marland Kitchen HYDROCHLOROTHIAZIDE 25 MG PO TABS Oral Take 25 mg by mouth daily.    Marland Kitchen HYDROCODONE-ACETAMINOPHEN 10-325 MG PO TABS Oral Take 1 tablet by mouth every 6  (six) hours as needed. For pain    . IPRATROPIUM-ALBUTEROL 0.5-2.5 (3) MG/3ML IN SOLN Nebulization Take 3 mLs by nebulization 4 (four) times daily - after meals and at bedtime. For COPD    . LEVOTHYROXINE SODIUM 175 MCG PO TABS Oral Take 175 mcg by mouth every morning.     Marland Kitchen LIDOCAINE 5 % EX PTCH Transdermal Place 1 patch onto the skin daily as needed. Remove & Discard patch within 12 hours or as directed by MD    . LISINOPRIL 5 MG PO TABS Oral Take 5 mg by mouth every morning.    Marland Kitchen MAGNESIUM HYDROXIDE 400 MG/5ML PO SUSP Oral Take 30 mLs by mouth daily as needed. For constipation    . METFORMIN HCL 1000 MG PO TABS Oral Take 1,000 mg by mouth 2 (two) times daily.     Marland Kitchen MIRTAZAPINE 45 MG PO TABS Oral Take 45 mg by mouth at bedtime. For insomnia    . PANTOPRAZOLE SODIUM 40 MG PO TBEC Oral Take 40 mg by mouth daily.    Marland Kitchen SIMVASTATIN 40 MG PO TABS Oral Take 40 mg by mouth at bedtime.       BP 159/98  Pulse 94  Temp 99.4 F (37.4 C) (Oral)  Resp 16  SpO2 99%  Physical Exam  Constitutional: She is oriented to person, place, and time. She appears well-developed. No distress.       Obese  HENT:  Head: Normocephalic and atraumatic.  Eyes: Conjunctivae normal and EOM are normal.  Neck: Normal range of motion. Neck supple.  Cardiovascular: Normal rate, regular rhythm and normal heart sounds.   Pulses:      Dorsalis pedis pulses are 2+ on the right side, and 2+ on the left side.       Posterior tibial pulses are 2+ on the right side, and 2+ on the left side.       Capillary refill < 3 seconds  Pulmonary/Chest: Effort normal and breath sounds normal.  Musculoskeletal:       Mild non-pitting edema to b/l feet. Full ROM. Tenderness to palpation of top of both feet.  Neurological: She is alert and oriented to person, place, and time. She has normal strength. No sensory deficit. Gait normal.  Skin: Skin is warm and dry.       Dry skin and mycotic toenails noted   Psychiatric: Her affect is blunt.  Her speech is tangential. She is slowed.  ED Course  Procedures (including critical care time)  Labs Reviewed - No data to display No results found.   1. Foot pain   2. Peripheral neuropathy       MDM  After looking on the controlled substance database, patient was given 30 Percocet on October 7. It has not been 30 days since then. She has an appointment with Dr. Roseanne Reno on Thursday. I will treat her pain in the emergency department, however I explained to her she has a pain contract and to ask for a muscle relaxer instead. I advised her to elevate her legs    Trevor Mace, PA-C 01/31/12 0001

## 2012-01-30 NOTE — ED Notes (Addendum)
Pt states bilateral toe pain. Pt states she sees a pain clinic and due to her pain in her toes going on for 2 weeks she started taking extra medications each day for the increase in pain. Pt is out of her neurotin, hydrocodone, and 2 other medications. Pt sees pain clinic on Thursday. Pt able to wiggle toes and CNS intact, pulses present cap refill less than 3

## 2012-01-31 NOTE — ED Provider Notes (Signed)
Medical screening examination/treatment/procedure(s) were performed by non-physician practitioner and as supervising physician I was immediately available for consultation/collaboration.    Vida Roller, MD 01/31/12 [phone]

## 2012-05-11 ENCOUNTER — Encounter (HOSPITAL_COMMUNITY): Payer: Self-pay | Admitting: *Deleted

## 2012-05-11 ENCOUNTER — Emergency Department (HOSPITAL_COMMUNITY)
Admission: EM | Admit: 2012-05-11 | Discharge: 2012-05-11 | Disposition: A | Discharge status: Home / Self Care | Payer: Medicaid Other | Attending: Emergency Medicine | Admitting: Emergency Medicine

## 2012-05-11 DIAGNOSIS — M549 Dorsalgia, unspecified: Secondary | ICD-10-CM

## 2012-05-11 DIAGNOSIS — R011 Cardiac murmur, unspecified: Secondary | ICD-10-CM | POA: Insufficient documentation

## 2012-05-11 DIAGNOSIS — Z79899 Other long term (current) drug therapy: Secondary | ICD-10-CM | POA: Insufficient documentation

## 2012-05-11 DIAGNOSIS — F329 Major depressive disorder, single episode, unspecified: Secondary | ICD-10-CM | POA: Insufficient documentation

## 2012-05-11 DIAGNOSIS — E0500 Thyrotoxicosis with diffuse goiter without thyrotoxic crisis or storm: Secondary | ICD-10-CM | POA: Insufficient documentation

## 2012-05-11 DIAGNOSIS — Z8659 Personal history of other mental and behavioral disorders: Secondary | ICD-10-CM | POA: Insufficient documentation

## 2012-05-11 DIAGNOSIS — M2550 Pain in unspecified joint: Secondary | ICD-10-CM

## 2012-05-11 DIAGNOSIS — J449 Chronic obstructive pulmonary disease, unspecified: Secondary | ICD-10-CM | POA: Insufficient documentation

## 2012-05-11 DIAGNOSIS — Z7982 Long term (current) use of aspirin: Secondary | ICD-10-CM | POA: Insufficient documentation

## 2012-05-11 DIAGNOSIS — IMO0002 Reserved for concepts with insufficient information to code with codable children: Secondary | ICD-10-CM | POA: Insufficient documentation

## 2012-05-11 DIAGNOSIS — F3289 Other specified depressive episodes: Secondary | ICD-10-CM | POA: Insufficient documentation

## 2012-05-11 DIAGNOSIS — Z8739 Personal history of other diseases of the musculoskeletal system and connective tissue: Secondary | ICD-10-CM | POA: Insufficient documentation

## 2012-05-11 DIAGNOSIS — I10 Essential (primary) hypertension: Secondary | ICD-10-CM | POA: Insufficient documentation

## 2012-05-11 DIAGNOSIS — E119 Type 2 diabetes mellitus without complications: Secondary | ICD-10-CM | POA: Insufficient documentation

## 2012-05-11 DIAGNOSIS — F411 Generalized anxiety disorder: Secondary | ICD-10-CM | POA: Insufficient documentation

## 2012-05-11 DIAGNOSIS — Z8709 Personal history of other diseases of the respiratory system: Secondary | ICD-10-CM | POA: Insufficient documentation

## 2012-05-11 DIAGNOSIS — F17200 Nicotine dependence, unspecified, uncomplicated: Secondary | ICD-10-CM | POA: Insufficient documentation

## 2012-05-11 DIAGNOSIS — Z8719 Personal history of other diseases of the digestive system: Secondary | ICD-10-CM | POA: Insufficient documentation

## 2012-05-11 DIAGNOSIS — Z780 Asymptomatic menopausal state: Secondary | ICD-10-CM | POA: Insufficient documentation

## 2012-05-11 DIAGNOSIS — Z8669 Personal history of other diseases of the nervous system and sense organs: Secondary | ICD-10-CM | POA: Insufficient documentation

## 2012-05-11 DIAGNOSIS — G8929 Other chronic pain: Secondary | ICD-10-CM | POA: Insufficient documentation

## 2012-05-11 DIAGNOSIS — R52 Pain, unspecified: Secondary | ICD-10-CM | POA: Insufficient documentation

## 2012-05-11 DIAGNOSIS — J4489 Other specified chronic obstructive pulmonary disease: Secondary | ICD-10-CM | POA: Insufficient documentation

## 2012-05-11 DIAGNOSIS — E785 Hyperlipidemia, unspecified: Secondary | ICD-10-CM | POA: Insufficient documentation

## 2012-05-11 DIAGNOSIS — M069 Rheumatoid arthritis, unspecified: Secondary | ICD-10-CM | POA: Insufficient documentation

## 2012-05-11 DIAGNOSIS — Z8619 Personal history of other infectious and parasitic diseases: Secondary | ICD-10-CM | POA: Insufficient documentation

## 2012-05-11 MED ORDER — KETOROLAC TROMETHAMINE 30 MG/ML IJ SOLN
60.0000 mg | Freq: Once | INTRAMUSCULAR | Status: AC
  Administered 2012-05-11: 60 mg via INTRAMUSCULAR
  Filled 2012-05-11: qty 2

## 2012-05-11 MED ORDER — HYDROCODONE-ACETAMINOPHEN 10-325 MG PO TABS: 1.0000 | ORAL_TABLET | Freq: Four times a day (QID) | ORAL | Status: DC | PRN

## 2012-05-11 MED ORDER — HYDROCODONE-ACETAMINOPHEN 10-325 MG PO TABS: 1.0000 | ORAL_TABLET | ORAL | Status: DC | PRN

## 2012-05-11 NOTE — ED Notes (Signed)
Pt given discharge paperwork; pt verbalized understanding of discharge and f/u; pt asking about steroid prescription that she stated PA was going to prescribe; RN to notify PA;

## 2012-05-11 NOTE — ED Notes (Signed)
Patient stated Doctor also treating patient for sinus infection.

## 2012-05-11 NOTE — ED Notes (Signed)
PT with hx of rheumatoid arthritis to ED c/o increased joint pain d/t increased cold.  Pt took muscle relaxers and neurontin with no relief.  Tried to make an appointment to see her pcp, but the office is closed today.

## 2012-05-25 ENCOUNTER — Inpatient Hospital Stay (HOSPITAL_COMMUNITY)
Admission: EM | Admit: 2012-05-25 | Discharge: 2012-05-31 | DRG: 070 | Disposition: A | Discharge status: Home / Self Care | Payer: Medicaid Other | Attending: Internal Medicine | Admitting: Internal Medicine

## 2012-05-25 ENCOUNTER — Emergency Department (HOSPITAL_COMMUNITY): Payer: Medicaid Other

## 2012-05-25 ENCOUNTER — Encounter (HOSPITAL_COMMUNITY): Payer: Self-pay | Admitting: *Deleted

## 2012-05-25 DIAGNOSIS — J449 Chronic obstructive pulmonary disease, unspecified: Secondary | ICD-10-CM | POA: Diagnosis present

## 2012-05-25 DIAGNOSIS — R55 Syncope and collapse: Secondary | ICD-10-CM | POA: Diagnosis present

## 2012-05-25 DIAGNOSIS — E039 Hypothyroidism, unspecified: Secondary | ICD-10-CM | POA: Diagnosis present

## 2012-05-25 DIAGNOSIS — E1169 Type 2 diabetes mellitus with other specified complication: Secondary | ICD-10-CM | POA: Diagnosis present

## 2012-05-25 DIAGNOSIS — F411 Generalized anxiety disorder: Secondary | ICD-10-CM | POA: Diagnosis present

## 2012-05-25 DIAGNOSIS — J438 Other emphysema: Secondary | ICD-10-CM | POA: Diagnosis present

## 2012-05-25 DIAGNOSIS — R4182 Altered mental status, unspecified: Secondary | ICD-10-CM

## 2012-05-25 DIAGNOSIS — Z79899 Other long term (current) drug therapy: Secondary | ICD-10-CM

## 2012-05-25 DIAGNOSIS — I472 Ventricular tachycardia, unspecified: Secondary | ICD-10-CM | POA: Diagnosis not present

## 2012-05-25 DIAGNOSIS — G9340 Encephalopathy, unspecified: Principal | ICD-10-CM | POA: Diagnosis present

## 2012-05-25 DIAGNOSIS — G4733 Obstructive sleep apnea (adult) (pediatric): Secondary | ICD-10-CM | POA: Diagnosis present

## 2012-05-25 DIAGNOSIS — K219 Gastro-esophageal reflux disease without esophagitis: Secondary | ICD-10-CM | POA: Diagnosis present

## 2012-05-25 DIAGNOSIS — M069 Rheumatoid arthritis, unspecified: Secondary | ICD-10-CM | POA: Diagnosis present

## 2012-05-25 DIAGNOSIS — M549 Dorsalgia, unspecified: Secondary | ICD-10-CM | POA: Diagnosis present

## 2012-05-25 DIAGNOSIS — G8929 Other chronic pain: Secondary | ICD-10-CM | POA: Diagnosis present

## 2012-05-25 DIAGNOSIS — F3289 Other specified depressive episodes: Secondary | ICD-10-CM | POA: Diagnosis present

## 2012-05-25 DIAGNOSIS — E872 Acidosis, unspecified: Secondary | ICD-10-CM | POA: Diagnosis present

## 2012-05-25 DIAGNOSIS — R109 Unspecified abdominal pain: Secondary | ICD-10-CM | POA: Diagnosis not present

## 2012-05-25 DIAGNOSIS — R569 Unspecified convulsions: Secondary | ICD-10-CM | POA: Diagnosis present

## 2012-05-25 DIAGNOSIS — I4729 Other ventricular tachycardia: Secondary | ICD-10-CM | POA: Diagnosis not present

## 2012-05-25 DIAGNOSIS — Z23 Encounter for immunization: Secondary | ICD-10-CM

## 2012-05-25 DIAGNOSIS — E162 Hypoglycemia, unspecified: Secondary | ICD-10-CM | POA: Diagnosis present

## 2012-05-25 DIAGNOSIS — E119 Type 2 diabetes mellitus without complications: Secondary | ICD-10-CM | POA: Diagnosis present

## 2012-05-25 DIAGNOSIS — J9602 Acute respiratory failure with hypercapnia: Secondary | ICD-10-CM | POA: Diagnosis present

## 2012-05-25 DIAGNOSIS — E0500 Thyrotoxicosis with diffuse goiter without thyrotoxic crisis or storm: Secondary | ICD-10-CM | POA: Diagnosis present

## 2012-05-25 DIAGNOSIS — R0603 Acute respiratory distress: Secondary | ICD-10-CM | POA: Diagnosis present

## 2012-05-25 DIAGNOSIS — R197 Diarrhea, unspecified: Secondary | ICD-10-CM | POA: Diagnosis present

## 2012-05-25 DIAGNOSIS — J9600 Acute respiratory failure, unspecified whether with hypoxia or hypercapnia: Secondary | ICD-10-CM | POA: Diagnosis present

## 2012-05-25 DIAGNOSIS — F17200 Nicotine dependence, unspecified, uncomplicated: Secondary | ICD-10-CM | POA: Diagnosis present

## 2012-05-25 DIAGNOSIS — E785 Hyperlipidemia, unspecified: Secondary | ICD-10-CM | POA: Diagnosis present

## 2012-05-25 DIAGNOSIS — F32A Depression, unspecified: Secondary | ICD-10-CM | POA: Diagnosis present

## 2012-05-25 HISTORY — DX: Other chronic pain: G89.29

## 2012-05-25 HISTORY — DX: Dorsalgia, unspecified: M54.9

## 2012-05-25 HISTORY — DX: Thyrotoxicosis with diffuse goiter without thyrotoxic crisis or storm: E05.00

## 2012-05-25 HISTORY — DX: Generalized anxiety disorder: F41.1

## 2012-05-25 LAB — CBC WITH DIFFERENTIAL/PLATELET
Basophils Absolute: 0 10*3/uL (ref 0.0–0.1)
Basophils Relative: 0 % (ref 0–1)
Eosinophils Absolute: 0.1 10*3/uL (ref 0.0–0.7)
Hemoglobin: 13.2 g/dL (ref 12.0–15.0)
MCH: 28.8 pg (ref 26.0–34.0)
MCHC: 33.2 g/dL (ref 30.0–36.0)
Monocytes Relative: 6 % (ref 3–12)
Neutrophils Relative %: 68 % (ref 43–77)
RDW: 13.6 % (ref 11.5–15.5)

## 2012-05-25 LAB — GLUCOSE, CAPILLARY
Glucose-Capillary: 59 mg/dL — ABNORMAL LOW (ref 70–99)
Glucose-Capillary: 148 mg/dL — ABNORMAL HIGH (ref 70–99)

## 2012-05-25 LAB — HEPATIC FUNCTION PANEL: Total Protein: 6.8 g/dL (ref 6.0–8.3)

## 2012-05-25 LAB — RAPID URINE DRUG SCREEN, HOSP PERFORMED
Amphetamines: NOT DETECTED
Tetrahydrocannabinol: NOT DETECTED

## 2012-05-25 LAB — URINALYSIS, ROUTINE W REFLEX MICROSCOPIC
Bilirubin Urine: NEGATIVE
Ketones, ur: NEGATIVE mg/dL
Nitrite: NEGATIVE
Specific Gravity, Urine: 1.027 (ref 1.005–1.030)
Urobilinogen, UA: 0.2 mg/dL (ref 0.0–1.0)

## 2012-05-25 LAB — ETHANOL: Alcohol, Ethyl (B): <11 mg/dL (ref 0–11)

## 2012-05-25 LAB — BASIC METABOLIC PANEL
BUN: 10 mg/dL (ref 6–23)
Creatinine, Ser: 0.92 mg/dL (ref 0.50–1.10)
GFR calc Af Amer: 80 mL/min — ABNORMAL LOW (ref >90)
GFR calc non Af Amer: 69 mL/min — ABNORMAL LOW (ref >90)
Potassium: 3.8 mEq/L (ref 3.5–5.1)

## 2012-05-25 LAB — URINE MICROSCOPIC-ADD ON

## 2012-05-25 LAB — AMMONIA: Ammonia: 30 umol/L (ref 11–60)

## 2012-05-25 MED ORDER — DEXTROSE 50 % IV SOLN
INTRAVENOUS | Status: AC
  Administered 2012-05-25: 50 mL via INTRAVENOUS
  Filled 2012-05-25: qty 50

## 2012-05-25 MED ORDER — DEXTROSE 50 % IV SOLN
50.0000 mL | Freq: Once | INTRAVENOUS | Status: AC
  Administered 2012-05-25: 50 mL via INTRAVENOUS

## 2012-05-25 NOTE — ED Notes (Signed)
Per EMS - pt showed up at her daughter apartment, admits to ETOH today and her oxycodone today, per family pt altered and "foaming at the mouth" - pt alert to verbal stimuli for EMS, no gross neuro deficits noted. Pt smells of ETOH. Pt's family unsure of amount of ETOH or oxycodone pt has ingested. Pt oriented x3.

## 2012-05-25 NOTE — ED Notes (Signed)
Pt opens eyes to verbal stimuli however difficult to arouse and quickly falls back to sleep after interaction. Pt only answering one word answers to questions. Pt able to follow commands, has equal grip strength and no facial droop noted on assessment. Pt admits to ETOH and taking her pain meds, however pt denies taking too much pain medication and denies any intention to harm herself.

## 2012-05-25 NOTE — ED Notes (Signed)
Pt's family at bedside - pt son states pt admitted to only drinking one beer today. Pt continues to be lethargic however arouses to verbal stimuli.

## 2012-05-25 NOTE — ED Notes (Signed)
Pt. CBG 59. Notified RN, Journalist, newspaper.

## 2012-05-25 NOTE — ED Notes (Addendum)
Pt unable to be aroused appropriately enough for PO intake at this time. Dr. Ranae Palms made aware of pt's current status.

## 2012-05-25 NOTE — ED Notes (Signed)
EKG given to EDP, Ranae Palms, MD.

## 2012-05-25 NOTE — ED Provider Notes (Signed)
History     This chart was scribed for Vanessa Racer, MD, MD by Burman Nieves ED Scribe. The patient was seen in room WA25/WA25 and the patient's care was started at 9:52 PM.   CSN: [id number]  Arrival date & time 05/25/12  2028   First MD Initiated Contact with Patient 05/25/12 2052     Level 5 Caveat- AMS  Chief Complaint  Patient presents with  . Alcohol Intoxication  . Altered Mental Status    The history is provided by the patient and the EMS personnel. No language interpreter was used.    Vanessa [name] is a 57 y.o. female brought in by EMS who presents to the Emergency Department for AMS after family reported alcohol and oxycodone ingestion. Family is unsure of the amount of alcohol and oxycodone ingested. Pt denies use of alcohol but due to AMS, she is unable to explain further symptoms. Pt's family states that she was "foaming at the mouth" earlier this evening. EMS reports that the pt was responsive to verbal stimuli en route. Pt has hx of hyperthyroidism and denies missing any recent doses. She denies having any pain currently but due to AMS is unable to explain further.    Past Medical History  Diagnosis Date  . Hypertension   . Heart murmur   . Asthma   . Emphysema   . Diabetes mellitus   . Hyperlipidemia   . Allergic rhinitis   . Chronic headache   . OSA (obstructive sleep apnea)   . Neuropathy   . Grave's disease   . Herniated disc   . COPD (chronic obstructive pulmonary disease)   . Sleep apnea   . Menopause   . Hernia, umbilical   . Anxiety   . Depression   . Back pain, chronic   . Shingles   . Herniated disc   . Arthritis     rheumatoid  . 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 Generalized anxiety disorder 05/26/2012    Past Surgical History  Procedure Laterality Date  . Cholecystectomy    . Tubal ligation    . Laparoscopy    . Foot surgery    . Eye surgery      Family History   Problem Relation Age of Onset  . Emphysema Mother   . Allergies Mother   . Allergies Daughter   . Asthma Mother   . Asthma Brother   . Heart disease Mother   . Rheum arthritis Mother   . Breast cancer Other     aunt  . Lung cancer Brother   . Throat cancer Brother     History  Substance Use Topics  . Smoking status: Current Every Day Smoker -- 1.00 packs/day for 40 years    Types: Cigarettes  . Smokeless tobacco: Never Used  . Alcohol Use: No    OB History   Grav Para Term Preterm Abortions TAB SAB Ect Mult Living   5 3   2  2   3       Review of Systems  Unable to perform ROS: Mental status change    Allergies  Ciprofloxacin and Penicillins  Home Medications   No current outpatient prescriptions on file.  Triage Vitals: BP 105/75  Pulse 86  Resp 17  SpO2 94%  Physical Exam  Nursing note and vitals reviewed. Constitutional: She appears well-developed and well-nourished. No distress.     HENT:  Head: Normocephalic  and atraumatic.  Mouth/Throat: Oropharynx is clear and moist.  Eyes: Conjunctivae and EOM are normal. Pupils are equal, round, and reactive to light.  Exophthalmus, right greater than left  Neck: Neck supple. No tracheal deviation present.  Cardiovascular: Normal rate and regular rhythm.   Pulmonary/Chest: Effort normal and breath sounds normal. No respiratory distress.  Abdominal: Soft. There is no tenderness.  Musculoskeletal: Normal range of motion.  Moves all extremities   Neurological: She is alert.  Pt arouses to verbal stimuli, follows simple commands, no gross neuro deficits noted  Skin: Skin is warm and dry.  No track marks noted  Psychiatric: She has a normal mood and affect. Her behavior is normal.    ED Course  Procedures (including critical care time)  DIAGNOSTIC STUDIES: 8:35 PM -Oxygen Saturation is 97% on room air, adequate by my interpretation.    COORDINATION OF CARE: 9:55 PM- Ordered CT head, ethanol, rapid drug  test, CBC and glucose.   Labs Reviewed  GLUCOSE, CAPILLARY - Abnormal; Notable for the following:    Glucose-Capillary 59 (*)    All other components within normal limits  BASIC METABOLIC PANEL - Abnormal; Notable for the following:    Glucose, Bld 60 (*)    GFR calc non Af Amer 69 (*)    GFR calc Af Amer 80 (*)    All other components within normal limits  URINALYSIS, ROUTINE W REFLEX MICROSCOPIC - Abnormal; Notable for the following:    Glucose, UA 100 (*)    Leukocytes, UA TRACE (*)    All other components within normal limits  SALICYLATE LEVEL - Abnormal; Notable for the following:    Salicylate Lvl <2.0 (*)    All other components within normal limits  URINE RAPID DRUG SCREEN (HOSP PERFORMED) - Abnormal; Notable for the following:    Opiates POSITIVE (*)    Benzodiazepines POSITIVE (*)    All other components within normal limits  HEPATIC FUNCTION PANEL - Abnormal; Notable for the following:    Total Bilirubin 0.2 (*)    All other components within normal limits  GLUCOSE, CAPILLARY - Abnormal; Notable for the following:    Glucose-Capillary 148 (*)    All other components within normal limits  BLOOD GAS, ARTERIAL - Abnormal; Notable for the following:    pH, Arterial 7.309 (*)    pCO2 arterial 66.0 (*)    pO2, Arterial 71.9 (*)    Bicarbonate 32.2 (*)    Acid-Base Excess 4.5 (*)    All other components within normal limits  GLUCOSE, CAPILLARY - Abnormal; Notable for the following:    Glucose-Capillary 55 (*)    All other components within normal limits  GLUCOSE, CAPILLARY - Abnormal; Notable for the following:    Glucose-Capillary 117 (*)    All other components within normal limits  BASIC METABOLIC PANEL - Abnormal; Notable for the following:    GFR calc non Af Amer 71 (*)    GFR calc Af Amer 83 (*)    All other components within normal limits  HEPATIC FUNCTION PANEL - Abnormal; Notable for the following:    Total Bilirubin 0.2 (*)    All other components within  normal limits  BLOOD GAS, ARTERIAL - Abnormal; Notable for the following:    pH, Arterial 7.325 (*)    pCO2 arterial 59.6 (*)    pO2, Arterial 68.5 (*)    Bicarbonate 30.4 (*)    Acid-Base Excess 3.3 (*)    All other components within normal  limits  GLUCOSE, CAPILLARY - Abnormal; Notable for the following:    Glucose-Capillary 101 (*)    All other components within normal limits  TSH - Abnormal; Notable for the following:    TSH 0.308 (*)    All other components within normal limits  BLOOD GAS, ARTERIAL - Abnormal; Notable for the following:    pH, Arterial 7.316 (*)    pCO2 arterial 60.0 (*)    Bicarbonate 29.7 (*)    Acid-Base Excess 2.7 (*)    All other components within normal limits  GLUCOSE, CAPILLARY - Abnormal; Notable for the following:    Glucose-Capillary 118 (*)    All other components within normal limits  GLUCOSE, CAPILLARY - Abnormal; Notable for the following:    Glucose-Capillary 129 (*)    All other components within normal limits  GLUCOSE, CAPILLARY - Abnormal; Notable for the following:    Glucose-Capillary 162 (*)    All other components within normal limits  GLUCOSE, CAPILLARY - Abnormal; Notable for the following:    Glucose-Capillary 184 (*)    All other components within normal limits  MRSA PCR SCREENING  STOOL CULTURE  OVA AND PARASITE EXAMINATION  CLOSTRIDIUM DIFFICILE BY PCR  CBC WITH DIFFERENTIAL  ACETAMINOPHEN LEVEL  ETHANOL  AMMONIA  URINE MICROSCOPIC-ADD ON  GLUCOSE, CAPILLARY  AMMONIA  CBC  GLUCOSE, CAPILLARY  MAGNESIUM  PHOSPHORUS  BASIC METABOLIC PANEL  CBC   Ct Head Wo Contrast  05/25/2012  *RADIOLOGY REPORT*  Clinical Data: Altered mental status.  Alcohol and occiput and ingestion. The patient is responsive to verbal stimulus.  CT HEAD WITHOUT CONTRAST  Technique:  Contiguous axial images were obtained from the base of the skull through the vertex without contrast.  Comparison: 01/06/2012.  Findings: The ventricles and sulci are  symmetrical without significant effacement, displacement, or dilatation. No mass effect or midline shift. No abnormal extra-axial fluid collections. The grey-white matter junction is distinct. Basal cisterns are not effaced. No acute intracranial hemorrhage. No depressed skull fractures.  Old bilateral medial orbital wall fractures, greater on the right with proptosis.  This is stable since the previous study. Paranasal sinuses and mastoid air cells are not opacified.  IMPRESSION: No acute intracranial abnormalities.  Old bilateral medial orbital rim fractures with proptosis, stable.   Original Report Authenticated By: Burman Nieves, M.D.    Mr Brain 64 Contrast  05/26/2012  *RADIOLOGY REPORT*  Clinical Data: 57 year old female with acute encephalopathy, confusion, altered mental status.  ICU patient recently weaned from ventilation assistance.  MRI HEAD WITHOUT CONTRAST  Technique:  Multiplanar, multiecho pulse sequences of the brain and surrounding structures were obtained according to standard protocol without intravenous contrast.  Comparison: Head CTs 05/25/2012 and earlier.  Findings: Study is intermittently degraded by motion artifact despite repeated imaging attempts.  Normal cerebral volume.No restricted diffusion to suggest acute infarction.  No midline shift, mass effect, evidence of mass lesion, ventriculomegaly, extra-axial collection or acute intracranial hemorrhage.  Cervicomedullary junction and pituitary are within normal limits.  Major intracranial vascular flow voids are preserved.  Tortuous distal cervical ICA.  Very mild periventricular white matter T2 and FLAIR hyperintensity, within normal limits for age.  Otherwise normal gray and white matter signal.  Grossly negative visualized cervical spine.  Chronic exophthalmos.  This appears related to increased intraorbital fat.  Chronic right lamina papyracea fracture. Chronic left orbital floor fracture.  Minimal ethmoid sinus mucosal  thickening.  Trace mastoid effusions.  Negative visualized nasopharynx.  Negative scalp soft tissues.  Normal  bone marrow signal.  IMPRESSION: 1.  No acute intracranial abnormality.  Negative for age noncontrast MRI appearance of the brain. 2.  Chronic orbital fractures.  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   Original Report Authenticated By: Erskine Speed, M.D.    Dg Chest Port 1 View  05/26/2012  *RADIOLOGY REPORT*  Clinical Data: History of sleep apnea, hypertension, COPD, diabetes, elevated CO2 levels  PORTABLE CHEST - 1 VIEW  Comparison: Chest x-ray of 12/13/2011  Findings: There is mild atelectasis at the left lung base.  No infiltrate or effusion is seen.  Mediastinal contours appear normal.  The heart is stable in size.  No skeletal abnormality is seen.  IMPRESSION: Mild left basilar atelectasis.  No active process.   Original Report Authenticated By: [name], M.D.      1. Altered mental status   2. Acute encephalopathy   3. COPD (chronic obstructive pulmonary disease)   4. Diabetes mellitus   5. Hypoglycemia   6. Acute respiratory acidosis       MDM  I personally performed the services described in this documentation, which was scribed in my presence. The recorded information has been reviewed and is accurate.    Vanessa Racer, MD 05/27/12 0030

## 2012-05-25 NOTE — ED Notes (Signed)
[id number]<AV> Expected date:<BR> Expected time:<BR> Means of arrival:<BR> Comments:<BR> EMS/ETOH/unknown drug ingestion-altered LOC

## 2012-05-26 ENCOUNTER — Inpatient Hospital Stay (HOSPITAL_COMMUNITY): Payer: Medicaid Other

## 2012-05-26 ENCOUNTER — Encounter (HOSPITAL_COMMUNITY): Payer: Self-pay | Admitting: Internal Medicine

## 2012-05-26 ENCOUNTER — Other Ambulatory Visit (HOSPITAL_COMMUNITY): Payer: Medicaid Other

## 2012-05-26 DIAGNOSIS — J449 Chronic obstructive pulmonary disease, unspecified: Secondary | ICD-10-CM

## 2012-05-26 DIAGNOSIS — E039 Hypothyroidism, unspecified: Secondary | ICD-10-CM | POA: Diagnosis present

## 2012-05-26 DIAGNOSIS — F32A Depression, unspecified: Secondary | ICD-10-CM | POA: Diagnosis present

## 2012-05-26 DIAGNOSIS — E119 Type 2 diabetes mellitus without complications: Secondary | ICD-10-CM | POA: Diagnosis present

## 2012-05-26 DIAGNOSIS — J4489 Other specified chronic obstructive pulmonary disease: Secondary | ICD-10-CM

## 2012-05-26 DIAGNOSIS — E162 Hypoglycemia, unspecified: Secondary | ICD-10-CM | POA: Diagnosis present

## 2012-05-26 DIAGNOSIS — F411 Generalized anxiety disorder: Secondary | ICD-10-CM

## 2012-05-26 DIAGNOSIS — F329 Major depressive disorder, single episode, unspecified: Secondary | ICD-10-CM | POA: Diagnosis present

## 2012-05-26 DIAGNOSIS — E0500 Thyrotoxicosis with diffuse goiter without thyrotoxic crisis or storm: Secondary | ICD-10-CM

## 2012-05-26 DIAGNOSIS — E872 Acidosis, unspecified: Secondary | ICD-10-CM

## 2012-05-26 DIAGNOSIS — G9340 Encephalopathy, unspecified: Principal | ICD-10-CM | POA: Diagnosis present

## 2012-05-26 DIAGNOSIS — R4182 Altered mental status, unspecified: Secondary | ICD-10-CM

## 2012-05-26 HISTORY — DX: Thyrotoxicosis with diffuse goiter without thyrotoxic crisis or storm: E05.00

## 2012-05-26 HISTORY — DX: Generalized anxiety disorder: F41.1

## 2012-05-26 LAB — BASIC METABOLIC PANEL
BUN: 9 mg/dL (ref 6–23)
Chloride: 99 mEq/L (ref 96–112)
GFR calc Af Amer: 83 mL/min — ABNORMAL LOW (ref >90)
GFR calc non Af Amer: 71 mL/min — ABNORMAL LOW (ref >90)
Glucose, Bld: 80 mg/dL (ref 70–99)
Potassium: 3.7 mEq/L (ref 3.5–5.1)
Sodium: 137 mEq/L (ref 135–145)

## 2012-05-26 LAB — BLOOD GAS, ARTERIAL
Acid-Base Excess: 2.7 mmol/L — ABNORMAL HIGH (ref 0.0–2.0)
Bicarbonate: 30.4 mEq/L — ABNORMAL HIGH (ref 20.0–24.0)
Drawn by: 244901
Drawn by: 347861
Expiratory PAP: 6
Inspiratory PAP: 12
O2 Content: 2 L/min
O2 Saturation: 94 %
TCO2: 27.2 mmol/L (ref 0–100)
pCO2 arterial: 59.6 mmHg (ref 35.0–45.0)
pCO2 arterial: 60 mmHg (ref 35.0–45.0)
pH, Arterial: 7.325 — ABNORMAL LOW (ref 7.350–7.450)
pO2, Arterial: 68.5 mmHg — ABNORMAL LOW (ref 80.0–100.0)
pO2, Arterial: 71.9 mmHg — ABNORMAL LOW (ref 80.0–100.0)

## 2012-05-26 LAB — CBC
HCT: 37.7 % (ref 36.0–46.0)
Hemoglobin: 12.5 g/dL (ref 12.0–15.0)
MCH: 28.9 pg (ref 26.0–34.0)
MCHC: 33.2 g/dL (ref 30.0–36.0)
RBC: 4.33 MIL/uL (ref 3.87–5.11)

## 2012-05-26 LAB — GLUCOSE, CAPILLARY
Glucose-Capillary: 80 mg/dL (ref 70–99)
Glucose-Capillary: 93 mg/dL (ref 70–99)
Glucose-Capillary: 101 mg/dL — ABNORMAL HIGH (ref 70–99)
Glucose-Capillary: 118 mg/dL — ABNORMAL HIGH (ref 70–99)
Glucose-Capillary: 129 mg/dL — ABNORMAL HIGH (ref 70–99)
Glucose-Capillary: 162 mg/dL — ABNORMAL HIGH (ref 70–99)
Glucose-Capillary: 184 mg/dL — ABNORMAL HIGH (ref 70–99)

## 2012-05-26 LAB — HEPATIC FUNCTION PANEL
ALT: 15 U/L (ref 0–35)
Albumin: 3.6 g/dL (ref 3.5–5.2)
Alkaline Phosphatase: 60 U/L (ref 39–117)
Total Bilirubin: 0.2 mg/dL — ABNORMAL LOW (ref 0.3–1.2)
Total Protein: 6.6 g/dL (ref 6.0–8.3)

## 2012-05-26 LAB — PHOSPHORUS: Phosphorus: 3.4 mg/dL (ref 2.3–4.6)

## 2012-05-26 MED ORDER — DEXTROSE-NACL 5-0.9 % IV SOLN
INTRAVENOUS | Status: DC
  Administered 2012-05-26: 75 mL/h via INTRAVENOUS
  Administered 2012-05-26: 01:00:00 via INTRAVENOUS

## 2012-05-26 MED ORDER — GABAPENTIN 300 MG PO CAPS
600.0000 mg | ORAL_CAPSULE | Freq: Three times a day (TID) | ORAL | Status: DC
  Administered 2012-05-26 – 2012-05-31 (×13): 600 mg via ORAL
  Filled 2012-05-26 (×17): qty 2

## 2012-05-26 MED ORDER — NICOTINE 21 MG/24HR TD PT24
21.0000 mg | MEDICATED_PATCH | Freq: Every day | TRANSDERMAL | Status: DC
  Administered 2012-05-29: 21 mg via TRANSDERMAL
  Filled 2012-05-26 (×7): qty 1

## 2012-05-26 MED ORDER — PNEUMOCOCCAL VAC POLYVALENT 25 MCG/0.5ML IJ INJ
0.5000 mL | INJECTION | INTRAMUSCULAR | Status: AC
  Filled 2012-05-26 (×2): qty 0.5

## 2012-05-26 MED ORDER — ENOXAPARIN SODIUM 60 MG/0.6ML ~~LOC~~ SOLN
60.0000 mg | SUBCUTANEOUS | Status: DC
  Administered 2012-05-26: 60 mg via SUBCUTANEOUS
  Filled 2012-05-26: qty 0.6

## 2012-05-26 MED ORDER — LORAZEPAM 2 MG/ML IJ SOLN
1.0000 mg | Freq: Four times a day (QID) | INTRAMUSCULAR | Status: DC | PRN
  Administered 2012-05-26 – 2012-05-29 (×6): 1 mg via INTRAVENOUS
  Filled 2012-05-26 (×5): qty 1

## 2012-05-26 MED ORDER — HYDRALAZINE HCL 20 MG/ML IJ SOLN: 10.0000 mg | INTRAMUSCULAR | Status: DC | PRN

## 2012-05-26 MED ORDER — ONDANSETRON HCL 4 MG PO TABS: 4.0000 mg | ORAL_TABLET | Freq: Four times a day (QID) | ORAL | Status: DC | PRN

## 2012-05-26 MED ORDER — IPRATROPIUM BROMIDE 0.02 % IN SOLN
0.5000 mg | Freq: Four times a day (QID) | RESPIRATORY_TRACT | Status: DC
  Administered 2012-05-26 – 2012-05-31 (×17): 0.5 mg via RESPIRATORY_TRACT
  Filled 2012-05-26 (×19): qty 2.5

## 2012-05-26 MED ORDER — FLUTICASONE PROPIONATE HFA 44 MCG/ACT IN AERO
2.0000 | INHALATION_SPRAY | Freq: Two times a day (BID) | RESPIRATORY_TRACT | Status: DC
  Administered 2012-05-26: 2 via RESPIRATORY_TRACT
  Filled 2012-05-26: qty 10.6

## 2012-05-26 MED ORDER — ONDANSETRON HCL 4 MG/2ML IJ SOLN: 4.0000 mg | Freq: Four times a day (QID) | INTRAMUSCULAR | Status: DC | PRN

## 2012-05-26 MED ORDER — CHLORHEXIDINE GLUCONATE 0.12 % MT SOLN
15.0000 mL | Freq: Two times a day (BID) | OROMUCOSAL | Status: DC
  Administered 2012-05-26 – 2012-05-30 (×5): 15 mL via OROMUCOSAL
  Filled 2012-05-26 (×14): qty 15

## 2012-05-26 MED ORDER — ENOXAPARIN SODIUM 40 MG/0.4ML ~~LOC~~ SOLN: 40.0000 mg | SUBCUTANEOUS | Status: DC

## 2012-05-26 MED ORDER — BIOTENE DRY MOUTH MT LIQD
15.0000 mL | Freq: Two times a day (BID) | OROMUCOSAL | Status: DC
  Administered 2012-05-26 – 2012-05-28 (×4): 15 mL via OROMUCOSAL

## 2012-05-26 MED ORDER — ACETAMINOPHEN 650 MG RE SUPP: 650.0000 mg | Freq: Four times a day (QID) | RECTAL | Status: DC | PRN

## 2012-05-26 MED ORDER — LEVOTHYROXINE SODIUM 100 MCG IV SOLR
87.0000 ug | Freq: Every day | INTRAVENOUS | Status: DC
  Administered 2012-05-26: 87 ug via INTRAVENOUS
  Filled 2012-05-26 (×2): qty 5

## 2012-05-26 MED ORDER — ENOXAPARIN SODIUM 40 MG/0.4ML ~~LOC~~ SOLN
40.0000 mg | SUBCUTANEOUS | Status: DC
  Administered 2012-05-27 – 2012-05-29 (×3): 40 mg via SUBCUTANEOUS
  Filled 2012-05-26 (×4): qty 0.4

## 2012-05-26 MED ORDER — ALBUTEROL SULFATE (5 MG/ML) 0.5% IN NEBU: 2.5000 mg | INHALATION_SOLUTION | RESPIRATORY_TRACT | Status: DC | PRN

## 2012-05-26 MED ORDER — ALBUTEROL SULFATE (5 MG/ML) 0.5% IN NEBU
2.5000 mg | INHALATION_SOLUTION | RESPIRATORY_TRACT | Status: DC
  Administered 2012-05-26 (×2): 2.5 mg via RESPIRATORY_TRACT
  Filled 2012-05-26 (×2): qty 0.5

## 2012-05-26 MED ORDER — DEXTROSE 50 % IV SOLN
INTRAVENOUS | Status: AC
  Filled 2012-05-26: qty 50

## 2012-05-26 MED ORDER — SIMVASTATIN 40 MG PO TABS
40.0000 mg | ORAL_TABLET | Freq: Every day | ORAL | Status: DC
  Administered 2012-05-26 – 2012-05-29 (×4): 40 mg via ORAL
  Filled 2012-05-26 (×6): qty 1

## 2012-05-26 MED ORDER — GABAPENTIN 600 MG PO TABS: 600.0000 mg | ORAL_TABLET | Freq: Three times a day (TID) | ORAL | Status: DC

## 2012-05-26 MED ORDER — PANTOPRAZOLE SODIUM 40 MG IV SOLR
40.0000 mg | Freq: Every day | INTRAVENOUS | Status: DC
  Administered 2012-05-26 (×2): 40 mg via INTRAVENOUS
  Filled 2012-05-26 (×3): qty 40

## 2012-05-26 MED ORDER — ALBUTEROL SULFATE (5 MG/ML) 0.5% IN NEBU
2.5000 mg | INHALATION_SOLUTION | Freq: Four times a day (QID) | RESPIRATORY_TRACT | Status: DC
  Administered 2012-05-26 – 2012-05-31 (×17): 2.5 mg via RESPIRATORY_TRACT
  Filled 2012-05-26 (×19): qty 0.5

## 2012-05-26 MED ORDER — SODIUM CHLORIDE 0.9 % IJ SOLN
3.0000 mL | Freq: Two times a day (BID) | INTRAMUSCULAR | Status: DC
  Administered 2012-05-26 – 2012-05-31 (×7): 3 mL via INTRAVENOUS

## 2012-05-26 MED ORDER — ACYCLOVIR 800 MG PO TABS
800.0000 mg | ORAL_TABLET | Freq: Every morning | ORAL | Status: DC
  Administered 2012-05-26 – 2012-05-31 (×6): 800 mg via ORAL
  Filled 2012-05-26 (×7): qty 1

## 2012-05-26 MED ORDER — MAGNESIUM HYDROXIDE 400 MG/5ML PO SUSP: 30.0000 mL | Freq: Every day | ORAL | Status: DC | PRN

## 2012-05-26 MED ORDER — INFLUENZA VIRUS VACC SPLIT PF IM SUSP
0.5000 mL | INTRAMUSCULAR | Status: AC
  Administered 2012-05-27: 0.5 mL via INTRAMUSCULAR
  Filled 2012-05-26 (×2): qty 0.5

## 2012-05-26 MED ORDER — ACETAMINOPHEN 325 MG PO TABS
650.0000 mg | ORAL_TABLET | Freq: Four times a day (QID) | ORAL | Status: DC | PRN
  Administered 2012-05-26 – 2012-05-27 (×5): 650 mg via ORAL
  Filled 2012-05-26 (×4): qty 2

## 2012-05-26 MED ORDER — DEXTROSE 50 % IV SOLN
50.0000 mL | Freq: Once | INTRAVENOUS | Status: AC
  Administered 2012-05-26: 50 mL via INTRAVENOUS

## 2012-05-26 MED ORDER — FLUOXETINE HCL 20 MG PO CAPS
60.0000 mg | ORAL_CAPSULE | Freq: Every morning | ORAL | Status: DC
  Administered 2012-05-27 – 2012-05-31 (×5): 60 mg via ORAL
  Filled 2012-05-26 (×6): qty 3

## 2012-05-26 MED ORDER — LEVETIRACETAM 500 MG PO TABS
500.0000 mg | ORAL_TABLET | Freq: Two times a day (BID) | ORAL | Status: DC
  Administered 2012-05-26 – 2012-05-31 (×11): 500 mg via ORAL
  Filled 2012-05-26 (×14): qty 1

## 2012-05-26 MED ORDER — IPRATROPIUM BROMIDE 0.02 % IN SOLN
0.5000 mg | RESPIRATORY_TRACT | Status: DC
  Administered 2012-05-26 (×2): 0.5 mg via RESPIRATORY_TRACT
  Filled 2012-05-26 (×2): qty 2.5

## 2012-05-26 NOTE — Plan of Care (Signed)
Problem: Phase I Progression Outcomes Goal: Tolerating diet Outcome: Not Progressing Too lethargic to eat.  Aspiration risk unless fully awake.

## 2012-05-26 NOTE — Progress Notes (Signed)
[facility] PROGRESS NOTE  Vanessa [name] ZOX:[id number] DOB: 1955-12-28 DOA: 05/25/2012 PCP: [name], MD  Assessment/Plan:  #1 acute encephalopathy Personable etiology. Differential includes probable seizures as patient was noted to be confused and foaming at the mouth prior to admission. Per son 2 days ago patient was also found on the floor passed out and foaming at the mouth by her daughter. Differential also includes hypercarbia and hypoglycemia versus possible CVA. Patient is currently alert and oriented x3. Patient on the BiPAP. CT of the head is negative. MRI of the head is pending. EEG is pending. Continue D5 normal saline. Continue frequent CBG checks. Ativan as needed. Will check a chest x-ray, phosphorus, magnesium. Will consult with neurology for further evaluation and management.  #2 hypoglycemia Likely secondary to combination of no oral intake yesterday per patient's son in the setting of glipizide. Glipizide has been discontinued. Metformin has been discontinued. Continue D5 normal saline. Frequent CBGs. Follow.  #3 respiratory acidosis Questionable etiology. Patient on the BiPAP speaking in full sentences. Patient does have a history of COPD. Will place on nebs. D/c BIPAP and check ABG in 2 hours. Will consult with critical care medicine for further evaluation and management.  #4 Graves Disease status post radiation ablation per patient TSH is pending. Continue IV Synthroid.  #5 hypertension Stable. Continue to hold BP meds. Hydralazine PRN.  #6 COPD/OSA  patient is currently on the BiPAP. Patient noted to have a respiratory acidosis. Continue nebulizer treatments. D/C BIPAP and check ABG in 2 hours. Will consult with critical care medicine for further evaluation and management.  #7 depression/anxiety Stable.  #8 prophylaxis PPI for GI prophylaxis. Lovenox for DVT prophylaxis.   Code Status: Full Family Communication: Updated patient and son at  bedside Disposition Plan: Home when medically stable.   Consultants:  Neurology: Dr. Leroy Kennedy pending  PCCM: Pending  Procedures:  CT head 05/25/2012  Antibiotics:  None  HPI/Subjective: Patient is alert and oriented x3. Patient on BiPAP mask. Patient states does not remember what happened yesterday to bring her to the hospital. Patient denies any current chest pain. No shortness of breath.  Objective: Filed Vitals:   05/26/12 0400 05/26/12 0500 05/26/12 0726 05/26/12 0800  BP:  104/61 106/61   Pulse:  71 72   Temp: 97.8 F (36.6 C)   97.9 F (36.6 C)  TempSrc: Axillary   Oral  Resp:  16 16   Height:      Weight:      SpO2:  100% 100%     Intake/Output Summary (Last 24 hours) at 05/26/12 0841 Last data filed at 05/26/12 0600  Gross per 24 hour  Intake    450 ml  Output    900 ml  Net   -450 ml   Filed Weights   05/26/12 0300  Weight: 101.5 kg (223 lb 12.3 oz)    Exam:   General:  NAD. On BIPAP.  Cardiovascular: RRR, no m/r/g. No JVD. No LE edema  Respiratory: CTAB. No wheezing , no rhonchi, no crackles  Abdomen: Soft, nontender, nondistended, positive bowel sounds.  Data Reviewed: Basic Metabolic Panel:  Recent Labs Lab 05/25/12 2115 05/26/12 0330  NA 137 137  K 3.8 3.7  CL 98 99  CO2 31 32  GLUCOSE 60* 80  BUN 10 9  CREATININE 0.92 0.90  CALCIUM 9.1 8.6   Liver Function Tests:  Recent Labs Lab 05/25/12 2205 05/26/12 0330  AST 12 12  ALT 16 15  ALKPHOS 61 60  BILITOT 0.2* 0.2*  PROT 6.8 6.6  ALBUMIN 3.6 3.6   No results found for this basename: LIPASE, AMYLASE,  in the last 168 hours  Recent Labs Lab 05/25/12 2205 05/26/12 0330  AMMONIA 30 30   CBC:  Recent Labs Lab 05/25/12 2115 05/26/12 0330  WBC 9.6 8.3  NEUTROABS 6.6  --   HGB 13.2 12.5  HCT 39.7 37.7  MCV 86.5 87.1  PLT 260 221   Cardiac Enzymes: No results found for this basename: CKTOTAL, CKMB, CKMBINDEX, TROPONINI,  in the last 168 hours BNP (last 3  results) No results found for this basename: PROBNP,  in the last 8760 hours CBG:  Recent Labs Lab 05/26/12 0015 05/26/12 0101 05/26/12 0223 05/26/12 0611 05/26/12 0802  GLUCAP 55* 117* 80 93 101*    Recent Results (from the past 240 hour(s))  MRSA PCR SCREENING     Status: None   Collection Time    05/26/12  3:00 AM      Result Value Range Status   MRSA by PCR NEGATIVE  NEGATIVE Final   Comment:            The GeneXpert MRSA Assay (FDA     approved for NASAL specimens     only), is one component of a     comprehensive MRSA colonization     surveillance program. It is not     intended to diagnose MRSA     infection nor to guide or     monitor treatment for     MRSA infections.     Studies: Ct Head Wo Contrast  05/25/2012  *RADIOLOGY REPORT*  Clinical Data: Altered mental status.  Alcohol and occiput and ingestion. The patient is responsive to verbal stimulus.  CT HEAD WITHOUT CONTRAST  Technique:  Contiguous axial images were obtained from the base of the skull through the vertex without contrast.  Comparison: 01/06/2012.  Findings: The ventricles and sulci are symmetrical without significant effacement, displacement, or dilatation. No mass effect or midline shift. No abnormal extra-axial fluid collections. The grey-white matter junction is distinct. Basal cisterns are not effaced. No acute intracranial hemorrhage. No depressed skull fractures.  Old bilateral medial orbital wall fractures, greater on the right with proptosis.  This is stable since the previous study. Paranasal sinuses and mastoid air cells are not opacified.  IMPRESSION: No acute intracranial abnormalities.  Old bilateral medial orbital rim fractures with proptosis, stable.   Original Report Authenticated By: Burman Nieves, M.D.     Scheduled Meds: . albuterol  2.5 mg Nebulization Q4H  . antiseptic oral rinse  15 mL Mouth Rinse q12n4p  . chlorhexidine  15 mL Mouth Rinse BID  . enoxaparin (LOVENOX) injection   60 mg Subcutaneous Q24H  . ipratropium  0.5 mg Nebulization Q4H  . levothyroxine  87 mcg Intravenous Daily  . pantoprazole (PROTONIX) IV  40 mg Intravenous QHS  . sodium chloride  3 mL Intravenous Q12H   Continuous Infusions: . dextrose 5 % and 0.9% NaCl 75 mL/hr at 05/26/12 0102    Principal Problem:   Acute encephalopathy Active Problems:   OSA (obstructive sleep apnea)   Hypoglycemia   Diabetes mellitus   COPD (chronic obstructive pulmonary disease)   Hypothyroidism   Acute respiratory acidosis   Respiratory distress, acute   Graves' disease with exophthalmos   Depression   Generalized anxiety disorder    Time spent: 32 MINS    [facility]  [facility] Pager [phone]. If 8PM-8AM, please contact  night-coverage at [email or web address], password [facility] 05/26/2012, 8:41 AM  LOS: 1 day

## 2012-05-26 NOTE — ED Notes (Signed)
Pt. CBG 80.Notified RN,[place].

## 2012-05-26 NOTE — Progress Notes (Signed)
Rx Brief Lovenox note  Pt with Wt=113 kg, BMI=36.9. CrCl>30, Plts=260  Rx adjusted Lovenox to 60mg  daily for DVT prophylaxis in pt with BMI>30  Lorenza Evangelist 05/26/2012 3:12 AM

## 2012-05-26 NOTE — ED Notes (Signed)
Pt. CBG 117. Notified RN, Journalist, newspaper.

## 2012-05-26 NOTE — H&P (Signed)
[facility] History and Physical  Sherell Christoffel ZOX:[id number] DOB: [birth year] DOA: 05/25/2012  Referring physician: Dr. Ranae Palms. PCP: [name], MD  Specialists: Dr. Cora Daniels.  Chief Complaint: Mental status changes.  HPI: Vanessa [name] is a 57 y.o. female with history of diabetes mellitus type 2, COPD, OSA was brought to the ER after patient's family found the patient has become altered mental status. As per patient's son who provided most of the history patient had come out of her house to take her dog for walking when she met her sister who lives across. She suddenly became confused and she sat on the couch and her mouth started forming patient became completely altered in mental status. She since then has been drowsy. In the ER patient was found to be hypoglycemic. Patient was given D50 one ampule after which her CBGs have increased 100 but repeat CBCs have shown a drop in sugar levels. Initial ABG shows elevated carbon dioxide levels. Patient at this time has been admitted for altered mental status. As per patient's son patient has been having some confusion over last 2-3 days. Urine drug screen is positive for opiates and benzodiazepine and patient's son feels that these are prescribed medications. Patient does take occasionally alcohol and patient's son feels that she may have had some alcohol yesterday. Patient is arousable and moves all extremities but is not oriented to place or time. She is oriented to her name and recognizes her son. Patient is afebrile and CT head is negative for anything acute. As per patient's son patient did not have any recent chest pain nausea vomiting diarrhea fever chills. Has not been on any new medications.  Review of Systems: As per the history of presenting illness nothing else significant.  Past Medical History  Diagnosis Date  . Hypertension   . Heart murmur   . Asthma   . Emphysema   . Diabetes mellitus   . Hyperlipidemia   .  Allergic rhinitis   . Chronic headache   . OSA (obstructive sleep apnea)   . Neuropathy   . Grave's disease   . Herniated disc   . COPD (chronic obstructive pulmonary disease)   . Sleep apnea   . Menopause   . Hernia, umbilical   . Anxiety   . Depression   . Back pain, chronic   . Shingles   . Herniated disc   . Arthritis     rheumatoid   Past Surgical History  Procedure Laterality Date  . Cholecystectomy    . Tubal ligation    . Laparoscopy    . Foot surgery    . Eye surgery     Social History:  reports that she has been smoking Cigarettes.  She has a 40 pack-year smoking history. She does not have any smokeless tobacco history on file. She reports that she does not drink alcohol or use illicit drugs. Lives at home alone. where does patient live--home, ALF, SNF? and with whom if at home? Usually does her ADLs. Can patient participate in ADLs?  Allergies  Allergen Reactions  . Ciprofloxacin Hives  . Penicillins Hives    Family History  Problem Relation Age of Onset  . Emphysema Mother   . Allergies Mother   . Allergies Daughter   . Asthma Mother   . Asthma Brother   . Heart disease Mother   . Rheum arthritis Mother   . Breast cancer Other     aunt  . Lung cancer Brother   . Throat cancer  Brother       Prior to Admission medications   Medication Sig Start Date End Date Taking? Authorizing Provider  acyclovir (ZOVIRAX) 400 MG tablet Take 800 mg by mouth every morning.    Yes Historical Provider, MD  albuterol (PROAIR HFA) 108 (90 BASE) MCG/ACT inhaler Inhale 2 puffs into the lungs every 6 (six) hours as needed. For COPD   Yes Historical Provider, MD  beclomethasone (QVAR) 80 MCG/ACT inhaler Inhale 2 puffs into the lungs every morning.    Yes Historical Provider, MD  busPIRone (BUSPAR) 10 MG tablet Take 30 mg by mouth 3 (three) times daily.   Yes Historical Provider, MD  carisoprodol (SOMA) 350 MG tablet Take 350 mg by mouth every morning.    Yes Historical  Provider, MD  clotrimazole-betamethasone (LOTRISONE) cream Apply 1 application topically 2 (two) times daily. For face   Yes Historical Provider, MD  docusate sodium (COLACE) 100 MG capsule Take 200 mg by mouth daily as needed for constipation.    Yes Historical Provider, MD  FLUoxetine (PROZAC) 20 MG capsule Take 60 mg by mouth every morning.    Yes Historical Provider, MD  gabapentin (NEURONTIN) 600 MG tablet Take 600 mg by mouth 3 (three) times daily.   Yes Historical Provider, MD  glipiZIDE (GLUCOTROL XL) 10 MG 24 hr tablet Take 10 mg by mouth 2 (two) times daily.   Yes Historical Provider, MD  hydrochlorothiazide (HYDRODIURIL) 25 MG tablet Take 25 mg by mouth every morning.    Yes Historical Provider, MD  HYDROcodone-acetaminophen (NORCO) 10-325 MG per tablet Take 1 tablet by mouth every 4 (four) hours as needed for pain. 05/11/12  Yes Arthor Captain, PA-C  ibuprofen (ADVIL,MOTRIN) 200 MG tablet Take 800 mg by mouth every 6 (six) hours as needed for pain.   Yes Historical Provider, MD  ipratropium-albuterol (DUONEB) 0.5-2.5 (3) MG/3ML SOLN Take 3 mLs by nebulization 4 (four) times daily - after meals and at bedtime. For COPD   Yes Historical Provider, MD  levothyroxine (SYNTHROID, LEVOTHROID) 175 MCG tablet Take 175 mcg by mouth every morning.    Yes Historical Provider, MD  lidocaine (LIDODERM) 5 % Place 1 patch onto the skin daily as needed. Remove & Discard patch within 12 hours or as directed by MD   Yes Historical Provider, MD  lisinopril (PRINIVIL,ZESTRIL) 5 MG tablet Take 5 mg by mouth every morning.   Yes Historical Provider, MD  magnesium hydroxide (MILK OF MAGNESIA) 400 MG/5ML suspension Take 30 mLs by mouth daily as needed. For constipation   Yes Historical Provider, MD  metFORMIN (GLUCOPHAGE) 1000 MG tablet Take 1,000 mg by mouth 2 (two) times daily.    Yes Historical Provider, MD  mirtazapine (REMERON) 45 MG tablet Take 45 mg by mouth at bedtime. For insomnia   Yes Historical  Provider, MD  pantoprazole (PROTONIX) 40 MG tablet Take 40 mg by mouth daily.   Yes Historical Provider, MD  simvastatin (ZOCOR) 40 MG tablet Take 40 mg by mouth at bedtime.    Yes Historical Provider, MD   Physical Exam: Filed Vitals:   05/25/12 2130 05/25/12 2145 05/26/12 0022 05/26/12 0030  BP: 119/78 129/87 127/75 109/59  Pulse: 79 77  73  Resp: 15 16 13 14   SpO2: 100% 100%  100%     General:  Well-developed well-nourished.  Eyes: Proptosis. Pupils are reacting to light.  ENT: No discharge from the ears eyes nose or mouth.  Neck: No mass felt.  Cardiovascular: S1-S2 heard.  Respiratory: No rhonchi no crepitations.  Abdomen: Soft nontender bowel sounds present.  Skin: No rash.  Musculoskeletal: Nontender.  Psychiatric: Patient is drowsy.  Neurologic: Patient is drowsy moves all extremities. For the CNS exam difficult.  Labs on Admission:  Basic Metabolic Panel:  Recent Labs Lab 05/25/12 2115  NA 137  K 3.8  CL 98  CO2 31  GLUCOSE 60*  BUN 10  CREATININE 0.92  CALCIUM 9.1   Liver Function Tests:  Recent Labs Lab 05/25/12 2205  AST 12  ALT 16  ALKPHOS 61  BILITOT 0.2*  PROT 6.8  ALBUMIN 3.6   No results found for this basename: LIPASE, AMYLASE,  in the last 168 hours  Recent Labs Lab 05/25/12 2205  AMMONIA 30   CBC:  Recent Labs Lab 05/25/12 2115  WBC 9.6  NEUTROABS 6.6  HGB 13.2  HCT 39.7  MCV 86.5  PLT 260   Cardiac Enzymes: No results found for this basename: CKTOTAL, CKMB, CKMBINDEX, TROPONINI,  in the last 168 hours  BNP (last 3 results) No results found for this basename: PROBNP,  in the last 8760 hours CBG:  Recent Labs Lab 05/25/12 2126 05/25/12 2202 05/26/12 0015  GLUCAP 59* 148* 55*    Radiological Exams on Admission: Ct Head Wo Contrast  05/25/2012  *RADIOLOGY REPORT*  Clinical Data: Altered mental status.  Alcohol and occiput and ingestion. The patient is responsive to verbal stimulus.  CT HEAD WITHOUT  CONTRAST  Technique:  Contiguous axial images were obtained from the base of the skull through the vertex without contrast.  Comparison: 01/06/2012.  Findings: The ventricles and sulci are symmetrical without significant effacement, displacement, or dilatation. No mass effect or midline shift. No abnormal extra-axial fluid collections. The grey-white matter junction is distinct. Basal cisterns are not effaced. No acute intracranial hemorrhage. No depressed skull fractures.  Old bilateral medial orbital wall fractures, greater on the right with proptosis.  This is stable since the previous study. Paranasal sinuses and mastoid air cells are not opacified.  IMPRESSION: No acute intracranial abnormalities.  Old bilateral medial orbital rim fractures with proptosis, stable.   Original Report Authenticated By: Burman Nieves, M.D.      Assessment/Plan Principal Problem:   Acute encephalopathy Active Problems:   OSA (obstructive sleep apnea)   Hypoglycemia   Diabetes mellitus   COPD (chronic obstructive pulmonary disease)   Hypothyroidism   1. Acute encephalopathy - the differentials include hypoglycemia/medication withdrawal/medication induced/possible CVA/hypoglycemic seizures/hypercarbia. At this time patient has been placed on D5 normal saline for her hypoglycemia. Do frequent CBG checks. Patient has been placed on BiPAP for her hypercarbia. Repeat ABG. Get MRI brain to rule out CVA and EEG to rule out seizures. Check ammonia levels. Patient has been placed on a when necessary Ativan for any agitation or seizure. Patient is a febrile. 2. Diabetes mellitus type 2 - presently hypoglycemic. See #1. 3. Hypothyroidism - check TSH. Until patient can take orally patient will be on IV Synthroid. 4. Hypertension - when necessary IV hydralazine for systolic blood pressure more than 160. 5. COPD and OSA - patient has been placed on BiPAP. Continue nebulizers. See #1.   Patient will be closely observed in  step down.  Code Status: Full code.  Family Communication: Discussed with patient's son who is at the bedside.  Disposition Plan: Admit to inpatient.  Yazaira Speas N. [facility] Pager [phone].  If 7PM-7AM, please contact night-coverage [email or web address] Password [facility] 05/26/2012, 1:03 AM

## 2012-05-26 NOTE — Progress Notes (Signed)
[id number] Earlene Plater, RN, BSN, CCM:  CHART REVIEWED AND UPDATED.  Next chart review due on [id number]. NO DISCHARGE NEEDS PRESENT AT THIS TIME. CASE MANAGEMENT [phone]

## 2012-05-26 NOTE — Consult Note (Signed)
NEURO HOSPITALIST CONSULT NOTE    Reason for Consult:seizure HPI:                                                                                                                                          Much of history obtained from chart.  Vanessa [name] is an 57 y.o. female history of diabetes mellitus type 2, COPD, OSA was brought to the ER after patient's family found the patient has become altered mental status. As per patient's son who provided most of the history patient had come out of her house to take her dog for walking when she met her sister who lives across. She suddenly became confused and she sat on the couch and her mouth started forming patient became completely altered in mental status. She since then has been drowsy. In the ER patient was found to be hypoglycemic. Patient was given D50 one ampule after which her CBGs have increased 100 but repeat CBCs have shown a drop in sugar levels. Patient at this time has been admitted for altered mental status. As per patient's son patient has been having some confusion over last 2-3 days. Urine drug screen is positive for opiates and benzodiazepine and patient's son feels that these are prescribed medications. Patient does take occasionally alcohol and patient's son feels that she may have had some alcohol yesterday.  Patient is afebrile and CT head is negative for anything acute.  Patients family states she has been on Xanax--unknown time and unknown how much she was taking per day.  She states she was on 1 mg daily and 2 mg at night. These were not prescribed to her -per patient. In addition, she was on Soma at night with CPAP for her sleep apnea.     Past Medical History  Diagnosis Date  . Hypertension   . Heart murmur   . Asthma   . Emphysema   . Diabetes mellitus   . Hyperlipidemia   . Allergic rhinitis   . Chronic headache   . OSA (obstructive sleep apnea)   . Neuropathy   . Grave's disease   .  Herniated disc   . COPD (chronic obstructive pulmonary disease)   . Sleep apnea   . Menopause   . Hernia, umbilical   . Anxiety   . Depression   . Back pain, chronic   . Shingles   . Herniated disc   . Arthritis     rheumatoid  . 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 Generalized anxiety disorder 05/26/2012    Past Surgical History  Procedure Laterality Date  . Cholecystectomy    . Tubal ligation    . Laparoscopy    . Foot surgery    .  Eye surgery      Family History  Problem Relation Age of Onset  . Emphysema Mother   . Allergies Mother   . Allergies Daughter   . Asthma Mother   . Asthma Brother   . Heart disease Mother   . Rheum arthritis Mother   . Breast cancer Other     aunt  . Lung cancer Brother   . Throat cancer Brother      Social History:  reports that she has been smoking Cigarettes.  She has a 40 pack-year smoking history. She does not have any smokeless tobacco history on file. She reports that she does not drink alcohol or use illicit drugs.  Allergies  Allergen Reactions  . Ciprofloxacin Hives  . Penicillins Hives    MEDICATIONS:                                                                                                                     Scheduled: . albuterol  2.5 mg Nebulization Q4H  . antiseptic oral rinse  15 mL Mouth Rinse q12n4p  . chlorhexidine  15 mL Mouth Rinse BID  . enoxaparin (LOVENOX) injection  60 mg Subcutaneous Q24H  . ipratropium  0.5 mg Nebulization Q4H  . levothyroxine  87 mcg Intravenous Daily  . pantoprazole (PROTONIX) IV  40 mg Intravenous QHS  . sodium chloride  3 mL Intravenous Q12H     ROS:                                                                                                                                       History obtained from unobtainable from patient due to mental status   Blood pressure 106/61, pulse 72, temperature 97.9 F  (36.6 C), temperature source Oral, resp. rate 16, height 5\' 9"  (1.753 m), weight 101.5 kg (223 lb 12.3 oz), SpO2 100.00%.   Neurologic Examination:  Mental Status: Alert, oriented, thought content appropriate.  Speech fluent without evidence of aphasia.  Able to follow 3 step commands without difficulty. Cranial Nerves: II: Discs flat bilaterally; Visual fields grossly normal, pupils equal, round, reactive to light and accommodation III,IV, VI: ptosis not present, extra-ocular shows normal on the right and decreased ability to look down on the left. Both eyes are proptotic.  V,VII: smile symmetric, facial light touch sensation normal bilaterally VIII: hearing normal bilaterally IX,X: gag reflex present XI: bilateral shoulder shrug XII: midline tongue extension Motor: Right : Upper extremity   5/5    Left:     Upper extremity   5/5  Lower extremity   5/5     Lower extremity   5/5 Tone and bulk:normal tone throughout; no atrophy noted Sensory: Pinprick and light touch intact throughout, bilaterally Deep Tendon Reflexes: 2+ and symmetric throughout Plantars: Right: downgoing   Left: downgoing Cerebellar: normal finger-to-nose,  normal heel-to-shin test  CV: pulses palpable throughout    No results found for this basename: cbc, bmp, coags, chol, tri, ldl, hga1c    Results for orders placed during the hospital encounter of 05/25/12 (from the past 48 hour(s))  CBC WITH DIFFERENTIAL     Status: None   Collection Time    05/25/12  9:15 PM      Result Value Range   WBC 9.6  4.0 - 10.5 K/uL   RBC 4.59  3.87 - 5.11 MIL/uL   Hemoglobin 13.2  12.0 - 15.0 g/dL   HCT 45.4  09.8 - 11.9 %   MCV 86.5  78.0 - 100.0 fL   MCH 28.8  26.0 - 34.0 pg   MCHC 33.2  30.0 - 36.0 g/dL   RDW 14.7  82.9 - 56.2 %   Platelets 260  150 - 400 K/uL   Neutrophils Relative 68  43 - 77 %   Neutro Abs 6.6  1.7 -  7.7 K/uL   Lymphocytes Relative 24  12 - 46 %   Lymphs Abs 2.3  0.7 - 4.0 K/uL   Monocytes Relative 6  3 - 12 %   Monocytes Absolute 0.6  0.1 - 1.0 K/uL   Eosinophils Relative 2  0 - 5 %   Eosinophils Absolute 0.1  0.0 - 0.7 K/uL   Basophils Relative 0  0 - 1 %   Basophils Absolute 0.0  0.0 - 0.1 K/uL  BASIC METABOLIC PANEL     Status: Abnormal   Collection Time    05/25/12  9:15 PM      Result Value Range   Sodium 137  135 - 145 mEq/L   Potassium 3.8  3.5 - 5.1 mEq/L   Chloride 98  96 - 112 mEq/L   CO2 31  19 - 32 mEq/L   Glucose, Bld 60 (*) 70 - 99 mg/dL   BUN 10  6 - 23 mg/dL   Creatinine, Ser 1.30  0.50 - 1.10 mg/dL   Calcium 9.1  8.4 - 86.5 mg/dL   GFR calc non Af Amer 69 (*) >90 mL/min   GFR calc Af Amer 80 (*) >90 mL/min   Comment:            The eGFR has been calculated     using the CKD EPI equation.     This calculation has not been     validated in all clinical     situations.     eGFR's persistently     <90 mL/min  signify     possible Chronic Kidney Disease.  GLUCOSE, CAPILLARY     Status: Abnormal   Collection Time    05/25/12  9:26 PM      Result Value Range   Glucose-Capillary 59 (*) 70 - 99 mg/dL   Comment 1 Notify RN    GLUCOSE, CAPILLARY     Status: Abnormal   Collection Time    05/25/12 10:02 PM      Result Value Range   Glucose-Capillary 148 (*) 70 - 99 mg/dL  ACETAMINOPHEN LEVEL     Status: None   Collection Time    05/25/12 10:05 PM      Result Value Range   Acetaminophen (Tylenol), Serum <15.0  10 - 30 ug/mL   Comment:            THERAPEUTIC CONCENTRATIONS VARY     SIGNIFICANTLY. A RANGE OF 10-30     ug/mL MAY BE AN EFFECTIVE     CONCENTRATION FOR MANY PATIENTS.     HOWEVER, SOME ARE BEST TREATED     AT CONCENTRATIONS OUTSIDE THIS     RANGE.     ACETAMINOPHEN CONCENTRATIONS     >150 ug/mL AT 4 HOURS AFTER     INGESTION AND >50 ug/mL AT 12     HOURS AFTER INGESTION ARE     OFTEN ASSOCIATED WITH TOXIC     REACTIONS.  SALICYLATE LEVEL      Status: Abnormal   Collection Time    05/25/12 10:05 PM      Result Value Range   Salicylate Lvl <2.0 (*) 2.8 - 20.0 mg/dL  ETHANOL     Status: None   Collection Time    05/25/12 10:05 PM      Result Value Range   Alcohol, Ethyl (B) <11  0 - 11 mg/dL   Comment:            LOWEST DETECTABLE LIMIT FOR     SERUM ALCOHOL IS 11 mg/dL     FOR MEDICAL PURPOSES ONLY  HEPATIC FUNCTION PANEL     Status: Abnormal   Collection Time    05/25/12 10:05 PM      Result Value Range   Total Protein 6.8  6.0 - 8.3 g/dL   Albumin 3.6  3.5 - 5.2 g/dL   AST 12  0 - 37 U/L   ALT 16  0 - 35 U/L   Alkaline Phosphatase 61  39 - 117 U/L   Total Bilirubin 0.2 (*) 0.3 - 1.2 mg/dL   Bilirubin, Direct <2.9  0.0 - 0.3 mg/dL   Indirect Bilirubin NOT CALCULATED  0.3 - 0.9 mg/dL  AMMONIA     Status: None   Collection Time    05/25/12 10:05 PM      Result Value Range   Ammonia 30  11 - 60 umol/L  URINALYSIS, ROUTINE W REFLEX MICROSCOPIC     Status: Abnormal   Collection Time    05/25/12 10:39 PM      Result Value Range   Color, Urine YELLOW  YELLOW   APPearance CLEAR  CLEAR   Specific Gravity, Urine 1.027  1.005 - 1.030   pH 5.0  5.0 - 8.0   Glucose, UA 100 (*) NEGATIVE mg/dL   Hgb urine dipstick NEGATIVE  NEGATIVE   Bilirubin Urine NEGATIVE  NEGATIVE   Ketones, ur NEGATIVE  NEGATIVE mg/dL   Protein, ur NEGATIVE  NEGATIVE mg/dL   Urobilinogen, UA 0.2  0.0 - 1.0  mg/dL   Nitrite NEGATIVE  NEGATIVE   Leukocytes, UA TRACE (*) NEGATIVE  URINE RAPID DRUG SCREEN (HOSP PERFORMED)     Status: Abnormal   Collection Time    05/25/12 10:39 PM      Result Value Range   Opiates POSITIVE (*) NONE DETECTED   Cocaine NONE DETECTED  NONE DETECTED   Benzodiazepines POSITIVE (*) NONE DETECTED   Amphetamines NONE DETECTED  NONE DETECTED   Tetrahydrocannabinol NONE DETECTED  NONE DETECTED   Barbiturates NONE DETECTED  NONE DETECTED   Comment:            DRUG SCREEN FOR MEDICAL PURPOSES     ONLY.  IF CONFIRMATION  IS NEEDED     FOR ANY PURPOSE, NOTIFY LAB     WITHIN 5 DAYS.                LOWEST DETECTABLE LIMITS     FOR URINE DRUG SCREEN     Drug Class       Cutoff (ng/mL)     Amphetamine      1000     Barbiturate      200     Benzodiazepine   200     Tricyclics       300     Opiates          300     Cocaine          300     THC              50  URINE MICROSCOPIC-ADD ON     Status: None   Collection Time    05/25/12 10:39 PM      Result Value Range   Squamous Epithelial / LPF RARE  RARE   WBC, UA 0-2  <3 WBC/hpf   Urine-Other MUCOUS PRESENT    BLOOD GAS, ARTERIAL     Status: Abnormal   Collection Time    05/26/12 12:13 AM      Result Value Range   O2 Content 2.0     Delivery systems NASAL CANNULA     pH, Arterial 7.309 (*) 7.350 - 7.450   pCO2 arterial 66.0 (*) 35.0 - 45.0 mmHg   Comment: CRITICAL RESULT CALLED TO, READ BACK BY AND VERIFIED WITH:      Cassie Freer, RN AT 0020 BY CAMERON CURRY, RRT, RCP ON 05/26/12   pO2, Arterial 71.9 (*) 80.0 - 100.0 mmHg   Bicarbonate 32.2 (*) 20.0 - 24.0 mEq/L   TCO2 29.5  0 - 100 mmol/L   Acid-Base Excess 4.5 (*) 0.0 - 2.0 mmol/L   O2 Saturation 94.0     Patient temperature 98.6     Collection site RIGHT RADIAL     Drawn by [id number]     Sample type ARTERIAL DRAW     Allens test (pass/fail) PASS  PASS  GLUCOSE, CAPILLARY     Status: Abnormal   Collection Time    05/26/12 12:15 AM      Result Value Range   Glucose-Capillary 55 (*) 70 - 99 mg/dL   Comment 1 Notify RN    GLUCOSE, CAPILLARY     Status: Abnormal   Collection Time    05/26/12  1:01 AM      Result Value Range   Glucose-Capillary 117 (*) 70 - 99 mg/dL   Comment 1 Notify RN    GLUCOSE, CAPILLARY     Status: None   Collection Time  05/26/12  2:23 AM      Result Value Range   Glucose-Capillary 80  70 - 99 mg/dL   Comment 1 Notify RN    MRSA PCR SCREENING     Status: None   Collection Time    05/26/12  3:00 AM      Result Value Range   MRSA by PCR NEGATIVE  NEGATIVE    Comment:            The GeneXpert MRSA Assay (FDA     approved for NASAL specimens     only), is one component of a     comprehensive MRSA colonization     surveillance program. It is not     intended to diagnose MRSA     infection nor to guide or     monitor treatment for     MRSA infections.  AMMONIA     Status: None   Collection Time    05/26/12  3:30 AM      Result Value Range   Ammonia 30  11 - 60 umol/L  BASIC METABOLIC PANEL     Status: Abnormal   Collection Time    05/26/12  3:30 AM      Result Value Range   Sodium 137  135 - 145 mEq/L   Potassium 3.7  3.5 - 5.1 mEq/L   Chloride 99  96 - 112 mEq/L   CO2 32  19 - 32 mEq/L   Glucose, Bld 80  70 - 99 mg/dL   BUN 9  6 - 23 mg/dL   Creatinine, Ser 4.09  0.50 - 1.10 mg/dL   Calcium 8.6  8.4 - 81.1 mg/dL   GFR calc non Af Amer 71 (*) >90 mL/min   GFR calc Af Amer 83 (*) >90 mL/min   Comment:            The eGFR has been calculated     using the CKD EPI equation.     This calculation has not been     validated in all clinical     situations.     eGFR's persistently     <90 mL/min signify     possible Chronic Kidney Disease.  CBC     Status: None   Collection Time    05/26/12  3:30 AM      Result Value Range   WBC 8.3  4.0 - 10.5 K/uL   RBC 4.33  3.87 - 5.11 MIL/uL   Hemoglobin 12.5  12.0 - 15.0 g/dL   HCT 91.4  78.2 - 95.6 %   MCV 87.1  78.0 - 100.0 fL   MCH 28.9  26.0 - 34.0 pg   MCHC 33.2  30.0 - 36.0 g/dL   RDW 21.3  08.6 - 57.8 %   Platelets 221  150 - 400 K/uL  HEPATIC FUNCTION PANEL     Status: Abnormal   Collection Time    05/26/12  3:30 AM      Result Value Range   Total Protein 6.6  6.0 - 8.3 g/dL   Albumin 3.6  3.5 - 5.2 g/dL   AST 12  0 - 37 U/L   ALT 15  0 - 35 U/L   Alkaline Phosphatase 60  39 - 117 U/L   Total Bilirubin 0.2 (*) 0.3 - 1.2 mg/dL   Bilirubin, Direct <4.6  0.0 - 0.3 mg/dL   Indirect Bilirubin NOT CALCULATED  0.3 - 0.9 mg/dL  GLUCOSE, CAPILLARY  Status: None   Collection  Time    05/26/12  6:11 AM      Result Value Range   Glucose-Capillary 93  70 - 99 mg/dL  BLOOD GAS, ARTERIAL     Status: Abnormal   Collection Time    05/26/12  6:18 AM      Result Value Range   FIO2 0.30     Delivery systems BILEVEL POSITIVE AIRWAY PRESSURE     Inspiratory PAP 12     Expiratory PAP 6     pH, Arterial 7.325 (*) 7.350 - 7.450   pCO2 arterial 59.6 (*) 35.0 - 45.0 mmHg   Comment: CRITICAL RESULT CALLED TO, READ BACK BY AND VERIFIED WITH:      Yong Channel, RN AT [phone] BY JUSTIN HOPPER, RRT, RCP ON 05/26/2012   pO2, Arterial 68.5 (*) 80.0 - 100.0 mmHg   Bicarbonate 30.4 (*) 20.0 - 24.0 mEq/L   TCO2 27.8  0 - 100 mmol/L   Acid-Base Excess 3.3 (*) 0.0 - 2.0 mmol/L   O2 Saturation 93.6     Patient temperature 97.8     Collection site RIGHT RADIAL     Drawn by [phone]     Sample type ARTERIAL     Allens test (pass/fail) PASS  PASS  GLUCOSE, CAPILLARY     Status: Abnormal   Collection Time    05/26/12  8:02 AM      Result Value Range   Glucose-Capillary 101 (*) 70 - 99 mg/dL    Ct Head Wo Contrast  05/25/2012  *RADIOLOGY REPORT*  Clinical Data: Altered mental status.  Alcohol and occiput and ingestion. The patient is responsive to verbal stimulus.  CT HEAD WITHOUT CONTRAST  Technique:  Contiguous axial images were obtained from the base of the skull through the vertex without contrast.  Comparison: 01/06/2012.  Findings: The ventricles and sulci are symmetrical without significant effacement, displacement, or dilatation. No mass effect or midline shift. No abnormal extra-axial fluid collections. The grey-white matter junction is distinct. Basal cisterns are not effaced. No acute intracranial hemorrhage. No depressed skull fractures.  Old bilateral medial orbital wall fractures, greater on the right with proptosis.  This is stable since the previous study. Paranasal sinuses and mastoid air cells are not opacified.  IMPRESSION: No acute intracranial abnormalities.  Old bilateral  medial orbital rim fractures with proptosis, stable.   Original Report Authenticated By: Burman Nieves, M.D.      Assessment/Plan: 57 YO female with multiple medical issues, questionable abuse of Xanax which was not prescribed to her and two episodes where family found her with AMS, foaming at mouth and difficult to arouse.  Family was unable to give good detailed history of event.  Seizure is very likely and patient will be obtaining MRI head and EEG today.  Given this is the second event, would intitiate Keppra 500 mg BID. IF EEG is negative and no focus found on MRI Keppra may be stopped prior to D/C.    Would also recommend discontinuation of all Xanax.   Discussed with Dr. Leroy Kennedy.   Assessment and plan discussed with with attending physician and they are in agreement.    Felicie Morn PA-C [facility] [phone]  05/26/2012, 9:02 AM  Patient seen and examined together with physician assistant and I concur with the assessment and plan.  Wyatt Portela, MD

## 2012-05-26 NOTE — Consult Note (Signed)
PULMONARY  / CRITICAL CARE MEDICINE  Name: Vanessa [name] MRN: [id number] DOB: 1955/12/22    ADMISSION DATE:  05/25/2012 CONSULTATION DATE:  2/27  REFERRING MD :  Dr. [name]  CHIEF COMPLAINT:  AMS, Resp distress  BRIEF PATIENT DESCRIPTION: [name] with PMH of HTN, DM, HLD, Graves Disease, COPD,  OSA on CPAP (followed by [facility]) admitted on 2/26 with AMS in setting of ETOH / oxycodone.  Found to have hypercarbic respiratory failure and placed on bipap. PCCM consulted for hypercarbic respiratory failure.    SIGNIFICANT EVENTS / STUDIES:  2/26 - Admit after ingestion of ETOH & oxycodone of unknown amount with AMS, ? Seizure.  placed on BiPAP for respiratory support in setting of hypercarbia / AMS 2/27 - improved mental status, BiPap removed.  MRI Brain >>>No acute intracranial abnormality. Negative for age noncontrast MRI appearance of the brain. Chronic orbital fractures. 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   LINES / TUBES:   CULTURES: 2/27 MRSA PCR>>>neg  ANTIBIOTICS:  HISTORY OF PRESENT ILLNESS:  [name] with PMH of HTN, DM, HLD, Graves Disease, COPD,  OSA on CPAP (followed by [facility]) presented to [facility] on 2/26 with AMS in setting of ETOH / oxycodone.  Family reported that she had taken an unknown amount of oxycodone and ETOH.  No acute suicidal concerns.  Was noted at home on the cough to be fine then later became somewhat drowsy and some foaming at the mouth.  Noted at that time to be hypoglycemic requiring dextrose IV.  Found to have hypercarbic respiratory failure and placed on bipap. PCCM consulted for hypercarbic respiratory failure.    PAST MEDICAL HISTORY :  Past Medical History  Diagnosis Date  . Hypertension   . Heart murmur   . Asthma   . Emphysema   . Diabetes mellitus   . Hyperlipidemia   . Allergic rhinitis   . Chronic headache   . OSA (obstructive sleep apnea)   . Neuropathy   . Grave's disease   .  Herniated disc   . COPD (chronic obstructive pulmonary disease)   . Sleep apnea   . Menopause   . Hernia, umbilical   . Anxiety   . Depression   . Back pain, chronic   . Shingles   . Herniated disc   . Arthritis     rheumatoid  . 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 Generalized anxiety disorder 05/26/2012   Past Surgical History  Procedure Laterality Date  . Cholecystectomy    . Tubal ligation    . Laparoscopy    . Foot surgery    . Eye surgery     Prior to Admission medications   Medication Sig Start Date End Date Taking? Authorizing Provider  acyclovir (ZOVIRAX) 400 MG tablet Take 800 mg by mouth every morning.    Yes Historical Provider, MD  albuterol (PROAIR HFA) 108 (90 BASE) MCG/ACT inhaler Inhale 2 puffs into the lungs every 6 (six) hours as needed. For COPD   Yes Historical Provider, MD  beclomethasone (QVAR) 80 MCG/ACT inhaler Inhale 2 puffs into the lungs every morning.    Yes Historical Provider, MD  busPIRone (BUSPAR) 10 MG tablet Take 20 mg by mouth 3 (three) times daily.   Yes Historical Provider, MD  carisoprodol (SOMA) 350 MG tablet Take 350 mg by mouth every morning.    Yes Historical Provider, MD  clotrimazole-betamethasone (  LOTRISONE) cream Apply 1 application topically 2 (two) times daily. For face   Yes Historical Provider, MD  docusate sodium (COLACE) 100 MG capsule Take 200 mg by mouth daily as needed for constipation.    Yes Historical Provider, MD  FLUoxetine (PROZAC) 20 MG capsule Take 60 mg by mouth every morning.    Yes Historical Provider, MD  gabapentin (NEURONTIN) 600 MG tablet Take 600 mg by mouth 3 (three) times daily.   Yes Historical Provider, MD  glipiZIDE (GLUCOTROL XL) 10 MG 24 hr tablet Take 10 mg by mouth 2 (two) times daily.   Yes Historical Provider, MD  hydrochlorothiazide (HYDRODIURIL) 25 MG tablet Take 25 mg by mouth every morning.    Yes Historical Provider, MD   HYDROcodone-acetaminophen (NORCO) 10-325 MG per tablet Take 1 tablet by mouth every 4 (four) hours as needed for pain. 05/11/12  Yes Arthor Captain, PA-C  ibuprofen (ADVIL,MOTRIN) 200 MG tablet Take 800 mg by mouth every 6 (six) hours as needed for pain.   Yes Historical Provider, MD  ipratropium-albuterol (DUONEB) 0.5-2.5 (3) MG/3ML SOLN Take 3 mLs by nebulization 4 (four) times daily - after meals and at bedtime. For COPD   Yes Historical Provider, MD  levothyroxine (SYNTHROID, LEVOTHROID) 175 MCG tablet Take 175 mcg by mouth every morning.    Yes Historical Provider, MD  lidocaine (LIDODERM) 5 % Place 1 patch onto the skin daily as needed. Remove & Discard patch within 12 hours or as directed by MD   Yes Historical Provider, MD  lisinopril (PRINIVIL,ZESTRIL) 5 MG tablet Take 5 mg by mouth every morning.   Yes Historical Provider, MD  magnesium hydroxide (MILK OF MAGNESIA) 400 MG/5ML suspension Take 30 mLs by mouth daily as needed. For constipation   Yes Historical Provider, MD  metFORMIN (GLUCOPHAGE) 1000 MG tablet Take 1,000 mg by mouth 2 (two) times daily.    Yes Historical Provider, MD  mirtazapine (REMERON) 45 MG tablet Take 45 mg by mouth at bedtime. For insomnia   Yes Historical Provider, MD  pantoprazole (PROTONIX) 40 MG tablet Take 40 mg by mouth daily.   Yes Historical Provider, MD  simvastatin (ZOCOR) 40 MG tablet Take 40 mg by mouth at bedtime.    Yes Historical Provider, MD   Allergies  Allergen Reactions  . Ciprofloxacin Hives  . Penicillins Hives    FAMILY HISTORY:  Family History  Problem Relation Age of Onset  . Emphysema Mother   . Allergies Mother   . Allergies Daughter   . Asthma Mother   . Asthma Brother   . Heart disease Mother   . Rheum arthritis Mother   . Breast cancer Other     aunt  . Lung cancer Brother   . Throat cancer Brother    SOCIAL HISTORY:  reports that she has been smoking Cigarettes.  She has a 40 pack-year smoking history. She has never used  smokeless tobacco. She reports that she does not drink alcohol or use illicit drugs.  REVIEW OF SYSTEMS:  Unable to complete as pt arouses and is appropriate but falls back to sleep.    SUBJECTIVE: RN reports low blood sugars resolving on D5.  No acute events.  No further "seizure" activity.   VITAL SIGNS: Temp:  [97.5 F (36.4 C)-98 F (36.7 C)] 98 F (36.7 C) (02/27 1200) Pulse Rate:  [71-86] 81 (02/27 1122) Resp:  [13-17] 16 (02/27 1122) BP: (94-129)/(56-87) 117/68 mmHg (02/27 1122) SpO2:  [94 %-100 %] 100 % (02/27 1155)  FiO2 (%):  [30 %] 30 % (02/27 0854) Weight:  [223 lb 12.3 oz (101.5 kg)] 223 lb 12.3 oz (101.5 kg) (02/27 0300)  VENTILATOR SETTINGS: Vent Mode:  [-]  FiO2 (%):  [30 %] 30 %  INTAKE / OUTPUT: Intake/Output     02/26 0701 - 02/27 0700 02/27 0701 - 02/28 0700   P.O.  320   I.V. (mL/kg) 525 (5.2) 375 (3.7)   Total Intake(mL/kg) 525 (5.2) 695 (6.8)   Urine (mL/kg/hr) 900 175 (0.2)   Total Output 900 175   Net -375 +520        Urine Occurrence 1 x      PHYSICAL EXAMINATION: General:  Morbidly obese female in NAD Neuro:  Arouses, speech clear, appropriate, follows commands but then falls back to sleep HEENT:  Mm pink/moist, short/thick neck Cardiovascular:  s1s2 rrr,distant tones Lungs:  resp's even / non-labored, lungs bilaterally diminished, few faint wheezes Abdomen:  Obese/soft, bsx4 active Musculoskeletal:  No acute deformities Skin:  Warm/dry, no edema  LABS:  Recent Labs Lab 05/25/12 2115 05/25/12 2205 05/26/12 0013 05/26/12 0330 05/26/12 0618 05/26/12 1017 05/26/12 1120  HGB 13.2  --   --  12.5  --   --   --   WBC 9.6  --   --  8.3  --   --   --   PLT 260  --   --  221  --   --   --   NA 137  --   --  137  --   --   --   K 3.8  --   --  3.7  --   --   --   CL 98  --   --  99  --   --   --   CO2 31  --   --  32  --   --   --   GLUCOSE 60*  --   --  80  --   --   --   BUN 10  --   --  9  --   --   --   CREATININE 0.92  --   --  0.90   --   --   --   CALCIUM 9.1  --   --  8.6  --   --   --   MG  --   --   --   --   --  2.0  --   PHOS  --   --   --   --   --  3.4  --   AST  --  12  --  12  --   --   --   ALT  --  16  --  15  --   --   --   ALKPHOS  --  61  --  60  --   --   --   BILITOT  --  0.2*  --  0.2*  --   --   --   PROT  --  6.8  --  6.6  --   --   --   ALBUMIN  --  3.6  --  3.6  --   --   --   PHART  --   --  7.309*  --  7.325*  --  7.316*  PCO2ART  --   --  66.0*  --  59.6*  --  60.0*  PO2ART  --   --  71.9*  --  68.5*  --  89.7    Recent Labs Lab 05/26/12 0223 05/26/12 0611 05/26/12 0802 05/26/12 1016 05/26/12 1153  GLUCAP 80 93 101* 118* 129*    CXR: 2/27 -mild basilar atx, no acute disease  ASSESSMENT / PLAN:  PULMONARY A: Respiratory Acidosis - in setting of known COPD, OSA combined with narcotics / ETOH.  Clear cxr.  No significant wheeze on exam concerning for AECOPD.  Hx of OSA - on cpap, followed by Dr. Shelle Iron.  ? Compliance given hx of broken machine in notes per Dr. Shelle Iron with multiple attempts to call pt and mask fit issues.  Was supposed to be on 15cm H2O Hx of COPD - current smoker. No PFT's for stage.    P:   -BiPap support PRN -f/u ABG reviewed -Adjust bronchodilators  -hold home QVAR -hold sedating medications -NPO if patient not fully alert  CARDIOVASCULAR A:  Hx of HTN HLD  P:  -hold home anti-hypertensive regimen -had been previously taken off ACE and is noted to be back on home medications.  Recommend consider transition to ARB instead due to hx of cough and pulmonary disease  GASTROINTESTINAL A:   GERD Reflux  P:   -PPI    INFECTIOUS A:   No evidence of acute infectious process.   P:   -monitor fever curve / leukocytosis  ENDOCRINE A:   DM - with episodes of hypoglycemia on D5  Graves Disease    P:   -per primary MD  NEUROLOGIC A:   Acute encephalopathy - neg MRI. Non-focal on exam.  Most likely in setting of hypercarbia related to  narcotics with underlying OSA / COPD.   P:   -supportive care -follow neuro exam    I have personally obtained a history, examined the patient, evaluated laboratory and imaging results, formulated the assessment and plan and placed orders.  CRITICAL CARE: The patient is critically ill with multiple organ systems failure and requires high complexity decision making for assessment and support, frequent evaluation and titration of therapies, application of advanced monitoring technologies and extensive interpretation of multiple databases. Critical Care Time devoted to patient care services described in this note is 45 minutes.    Canary Brim, NP-C Pulmonary and Critical Care Medicine [facility] Pager: [phone]  05/26/2012, 2:18 PM   Levy Pupa, MD, PhD 05/26/2012, 5:25 PM [place] [facility] [phone] or if no answer [phone]

## 2012-05-26 NOTE — ED Notes (Signed)
Pt. CBG 57. Notified RN,Koechert.

## 2012-05-27 ENCOUNTER — Encounter (HOSPITAL_COMMUNITY): Payer: Self-pay | Admitting: Internal Medicine

## 2012-05-27 ENCOUNTER — Inpatient Hospital Stay (HOSPITAL_COMMUNITY)
Admit: 2012-05-27 | Discharge: 2012-05-27 | Disposition: A | Discharge status: Home / Self Care | Payer: Medicaid Other | Attending: Internal Medicine | Admitting: Internal Medicine

## 2012-05-27 ENCOUNTER — Telehealth: Payer: Self-pay | Admitting: *Deleted

## 2012-05-27 DIAGNOSIS — E162 Hypoglycemia, unspecified: Secondary | ICD-10-CM

## 2012-05-27 DIAGNOSIS — M549 Dorsalgia, unspecified: Secondary | ICD-10-CM | POA: Diagnosis present

## 2012-05-27 DIAGNOSIS — G4733 Obstructive sleep apnea (adult) (pediatric): Secondary | ICD-10-CM

## 2012-05-27 DIAGNOSIS — R197 Diarrhea, unspecified: Secondary | ICD-10-CM | POA: Diagnosis present

## 2012-05-27 DIAGNOSIS — G8929 Other chronic pain: Secondary | ICD-10-CM

## 2012-05-27 DIAGNOSIS — R109 Unspecified abdominal pain: Secondary | ICD-10-CM | POA: Diagnosis not present

## 2012-05-27 HISTORY — DX: Other chronic pain: G89.29

## 2012-05-27 LAB — GLUCOSE, CAPILLARY
Glucose-Capillary: 144 mg/dL — ABNORMAL HIGH (ref 70–99)
Glucose-Capillary: 164 mg/dL — ABNORMAL HIGH (ref 70–99)
Glucose-Capillary: 233 mg/dL — ABNORMAL HIGH (ref 70–99)

## 2012-05-27 LAB — CBC
Platelets: 207 10*3/uL (ref 150–400)
RDW: 13.3 % (ref 11.5–15.5)
WBC: 8.6 10*3/uL (ref 4.0–10.5)

## 2012-05-27 LAB — BASIC METABOLIC PANEL
Calcium: 8.6 mg/dL (ref 8.4–10.5)
Creatinine, Ser: 0.81 mg/dL (ref 0.50–1.10)
GFR calc Af Amer: >90 mL/min (ref >90)
GFR calc non Af Amer: 81 mL/min — ABNORMAL LOW (ref >90)

## 2012-05-27 LAB — CLOSTRIDIUM DIFFICILE BY PCR: Toxigenic C. Difficile by PCR: NEGATIVE

## 2012-05-27 LAB — CORTISOL: Cortisol, Plasma: 15.4 ug/dL

## 2012-05-27 LAB — HEMOGLOBIN A1C: Hgb A1c MFr Bld: 6.4 % — ABNORMAL HIGH (ref <5.7)

## 2012-05-27 MED ORDER — CARVEDILOL 6.25 MG PO TABS
6.2500 mg | ORAL_TABLET | Freq: Two times a day (BID) | ORAL | Status: DC
  Administered 2012-05-27 – 2012-05-29 (×5): 6.25 mg via ORAL
  Filled 2012-05-27 (×8): qty 1

## 2012-05-27 MED ORDER — SODIUM CHLORIDE 0.9 % IV SOLN
INTRAVENOUS | Status: DC
  Administered 2012-05-27: 75 mL/h via INTRAVENOUS
  Administered 2012-05-28: 1000 mL via INTRAVENOUS

## 2012-05-27 MED ORDER — FLUTICASONE PROPIONATE HFA 44 MCG/ACT IN AERO
2.0000 | INHALATION_SPRAY | Freq: Two times a day (BID) | RESPIRATORY_TRACT | Status: DC
  Administered 2012-05-27 – 2012-05-29 (×5): 2 via RESPIRATORY_TRACT
  Filled 2012-05-27 (×2): qty 10.6

## 2012-05-27 MED ORDER — PANTOPRAZOLE SODIUM 40 MG PO TBEC
40.0000 mg | DELAYED_RELEASE_TABLET | Freq: Every day | ORAL | Status: DC
  Administered 2012-05-27 – 2012-05-31 (×5): 40 mg via ORAL
  Filled 2012-05-27 (×6): qty 1

## 2012-05-27 MED ORDER — HYDROCODONE-ACETAMINOPHEN 5-325 MG PO TABS
1.0000 | ORAL_TABLET | Freq: Four times a day (QID) | ORAL | Status: DC
  Administered 2012-05-27 – 2012-05-28 (×4): 1 via ORAL
  Filled 2012-05-27 (×4): qty 1

## 2012-05-27 MED ORDER — CARISOPRODOL 350 MG PO TABS
350.0000 mg | ORAL_TABLET | Freq: Every day | ORAL | Status: DC
  Administered 2012-05-27 – 2012-05-31 (×5): 350 mg via ORAL
  Filled 2012-05-27 (×6): qty 1

## 2012-05-27 MED ORDER — LEVOTHYROXINE SODIUM 175 MCG PO TABS
175.0000 ug | ORAL_TABLET | Freq: Every day | ORAL | Status: DC
  Administered 2012-05-27 – 2012-05-31 (×5): 175 ug via ORAL
  Filled 2012-05-27 (×8): qty 1

## 2012-05-27 NOTE — Progress Notes (Addendum)
PULMONARY  / CRITICAL CARE MEDICINE  Name: Vanessa [name] MRN: [id number] DOB: 05/25/1955    ADMISSION DATE:  05/25/2012 CONSULTATION DATE:  2/27  REFERRING MD :  Dr. [name]  CHIEF COMPLAINT:  AMS, Resp distress  BRIEF PATIENT DESCRIPTION: [name] with PMH of HTN, DM, HLD, Graves Disease, COPD,  OSA on CPAP (followed by [facility]) admitted on 2/26 with AMS in setting of ETOH / oxycodone.  Found to have hypercarbic respiratory failure and placed on bipap. PCCM consulted for hypercarbic respiratory failure.    SIGNIFICANT EVENTS / STUDIES:  2/26 - Admit after ingestion of ETOH & oxycodone of unknown amount with AMS, ? Seizure.  placed on BiPAP for respiratory support in setting of hypercarbia / AMS 2/27 - improved mental status, BiPap removed.  MRI Brain >>>No acute intracranial abnormality. Negative for age noncontrast MRI appearance of the brain. Chronic orbital fractures. 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   LINES / TUBES:   CULTURES: 2/27 MRSA PCR>>>neg  ANTIBIOTICS:  HISTORY OF PRESENT ILLNESS:  [name] with PMH of HTN, DM, HLD, Graves Disease, COPD,  OSA on CPAP (followed by [facility]) presented to [facility] ER on 2/26 with AMS in setting of ETOH / oxycodone.  Family reported that she had taken an unknown amount of oxycodone and ETOH.  No acute suicidal concerns.  Was noted at home on the cough to be fine then later became somewhat drowsy and some foaming at the mouth.  Noted at that time to be hypoglycemic requiring dextrose IV.  Found to have hypercarbic respiratory failure and placed on bipap. PCCM consulted for hypercarbic respiratory failure.    VITAL SIGNS: Temp:  [97.5 F (36.4 C)-98.7 F (37.1 C)] 97.5 F (36.4 C) (02/28 0800) Pulse Rate:  [65-83] 65 (02/28 0757) Resp:  [14-22] 20 (02/28 0757) BP: (106-125)/(68-86) 125/86 mmHg (02/28 0757) SpO2:  [97 %-100 %] 100 % (02/28 0810) Weight:  [106 kg (233 lb 11 oz)] 106 kg (233 lb  11 oz) (02/28 0500) Room air      INTAKE / OUTPUT: Intake/Output     02/27 0701 - 02/28 0700 02/28 0701 - 03/01 0700   P.O. 1280 240   I.V. (mL/kg) 1575 (14.9) 150 (1.4)   Total Intake(mL/kg) 2855 (26.9) 390 (3.7)   Urine (mL/kg/hr) 3400 (1.3) 850 (1.9)   Stool 2 (0)    Total Output 3402 850   Net -547 -460          PHYSICAL EXAMINATION: General:  Morbidly obese female in NAD Neuro:  Arouses, speech clear, appropriate, follows commands but then falls back to sleep HEENT:  Mm pink/moist, short/thick neck Cardiovascular:  s1s2 rrr,distant tones Lungs:  resp's even / non-labored, lungs bilaterally diminished, few faint wheezes Abdomen:  Obese/soft, bsx4 active Musculoskeletal:  No acute deformities Skin:  Warm/dry, no edema  LABS:  Recent Labs Lab 05/25/12 2115 05/26/12 0330 05/27/12 0655  NA 137 137 136  K 3.8 3.7 4.0  CL 98 99 99  CO2 31 32 31  BUN 10 9 5*  CREATININE 0.92 0.90 0.81  GLUCOSE 60* 80 173*    Recent Labs Lab 05/25/12 2115 05/26/12 0330 05/27/12 0655  HGB 13.2 12.5 11.8*  HCT 39.7 37.7 36.0  WBC 9.6 8.3 8.6  PLT 260 221 207   ABG    Component Value Date/Time   PHART 7.316* 05/26/2012 1120   PCO2ART 60.0* 05/26/2012 1120   PO2ART 89.7 05/26/2012 1120   HCO3 29.7*  05/26/2012 1120   TCO2 27.2 05/26/2012 1120   O2SAT 96.8 05/26/2012 1120    Recent Labs Lab 05/26/12 1611 05/26/12 1932 05/27/12 0010 05/27/12 0454 05/27/12 0743  GLUCAP 162* 184* 149* 136* 144*    CXR: 2/27 -mild basilar atx, no acute disease  ASSESSMENT / PLAN:   Respiratory Acidosis - in setting of known COPD, OSA combined with narcotics / ETOH.  Clear cxr.  No significant wheeze on exam concerning for AECOPD. Clinically appears better.  Hx of OSA - on cpap, followed by Dr. Shelle Iron. Pt reports mask fit issues and recent step down from 15 cm H2o to 10 cm H20 as an outpt. She thinks more sleepy since this move.  Hx of COPD - current smoker. No PFT's for stage.   Hx of  HTN HLD GERD Reflux  DM - with episodes of hypoglycemia on D5  Graves Disease  Acute encephalopathy - neg MRI. Non-focal on exam.  Most likely in setting of hypercarbia related to narcotics with underlying OSA / COPD.  Seems better.  P:   -resume home bd rx -wean fio2 -reasonable to stay with 15 cmH2o and keep current mask she has if this feels better for her (she says it does) -resume nocturnal CPAP -hold sedating meds.  -will set her up w/ pulm follow up w Dr Shelle Iron  PCCM will sign off, please call if we can assist  Levy Pupa, MD, PhD 05/27/2012, 11:42 AM [place] [facility] [phone] or if no answer [phone]

## 2012-05-27 NOTE — Progress Notes (Signed)
[facility] PROGRESS NOTE  Vanessa [name] MRN:[id number] DOB: 08/01/1955 DOA: 05/25/2012 PCP: HASSAN,SAMI, MD  Assessment/Plan:  #1 acute encephalopathy ?? etiology. Clinical improvement and per family close to baseline. Differential includes probable seizures as patient was noted to be confused and foaming at the mouth prior to admission. Per son 2 days ago patient was also found on the floor passed out and foaming at the mouth by her daughter. Differential also includes hypercarbia and hypoglycemia versus possible CVA. Patient is currently alert and oriented x3. Patient off the BiPAP. CT of the head is negative. MRI of the head is negativepending. EEG is pending. Patient started empirically on keppra per neurology. Change D5 normal saline to NS and follow CBG. Neurology ff and appreciate input and rxcs..  #2 hypoglycemia/DM2 Likely secondary to combination of no oral intake yesterday per patient's son in the setting of glipizide. CBG have ranged from 60 - 173. Check random cortisol level and sulfonyurea panel. Check Hgb A1C. Glipizide has been discontinued. Metformin has been discontinued. Discontinue D5 normal saline. Follow CBGs. Follow.  #3 respiratory acidosis Questionable etiology. Maybe secondary to COPD, OSA combined with narcotics/ETOH. Patient off the BiPAP speaking in full sentences. Patient does have a history of COPD/OSA. No signs of acute COPD exacerbation. Continue nebs.  BIPAP PRN. PCCM ff and appreciate input and rxcs.    #4 Graves Disease status post radiation ablation per patient TSH is 3.08. Change IV Synthroid to oral home dose. F/U as outpatient.  #5 hypertension Stable. Continue to hold BP meds. Hydralazine PRN. 1 blood pressure medications are resumed we'll start on ARB is that of her ACE inhibitor secondary to cough and pulmonary issues as per Dr. Clance's office notes.  #6 COPD/OSA  patient is currently off the BiPAP. Patient noted to have a respiratory  acidosis. Continue nebulizer treatments. Hold QVAR per PCCM. PCCM ff and appreciate input and rxcs. CPAP QHS.  #7 chronic back pain/abdominal pain Patient states abdominal pain is similar to her chronic pain and asking for pain medications. We'll resume patient's home pain regimen at half the dose and follow.  #8 diarrhea Patient with no noted loose stools overnight. C. difficile PCR pending. Stool studies pending. Follow.  #9 depression/anxiety Stable. Resume Prozac  #10 tobacco abuse Tobacco cessation. Nicotine patch.  #11 prophylaxis PPI for GI prophylaxis. Lovenox for DVT prophylaxis.   Code Status: Full Family Communication: Updated patient and son at bedside Disposition Plan: Home when medically stable.   Consultants:  Neurology: Dr. Camilo 05/27/11  PCCM: Dr Byrum 05/27/11  Procedures:  CT head 05/25/2012  MRI head 05/26/2012  EEG pending  Antibiotics:  None  HPI/Subjective: Patient is alert and oriented x3. Patient c/o diffuse abdominal pain going around to her back which she states is similar to her chronic pain and asking for her pain medications. Patient c/o losse stools prior to admission.   Objective: Filed Vitals:   05/27/12 0400 05/27/12 0500 05/27/12 0800 05/27/12 0810  BP: 124/74     Pulse: 78     Temp: 98.6 F (37 C)  97.5 F (36.4 C)   TempSrc: Axillary  Oral   Resp: 18     Height:      Weight:  106 kg (233 lb 11 oz)    SpO2: 100%   100%    Intake/Output Summary (Last 24 hours) at 05/27/12 0817 Last data filed at 05/27/12 0600  Gross per 24 hour  Intake   2705 ml  Output   3327 ml    Net   -622 ml   Filed Weights   05/26/12 0300 05/27/12 0500  Weight: 101.5 kg (223 lb 12.3 oz) 106 kg (233 lb 11 oz)    Exam:   General:  NAD.   Cardiovascular: RRR, no m/r/g. No JVD. No LE edema  Respiratory: CTAB. No wheezing , no rhonchi, no crackles  Abdomen: Soft, diffuse TTP, nondistended, positive bowel sounds.  Data Reviewed: Basic  Metabolic Panel:  Recent Labs Lab 05/25/12 2115 05/26/12 0330 05/26/12 1017 05/27/12 0655  NA 137 137  --  136  K 3.8 3.7  --  4.0  CL 98 99  --  99  CO2 31 32  --  31  GLUCOSE 60* 80  --  173*  BUN 10 9  --  5*  CREATININE 0.92 0.90  --  0.81  CALCIUM 9.1 8.6  --  8.6  MG  --   --  2.0  --   PHOS  --   --  3.4  --    Liver Function Tests:  Recent Labs Lab 05/25/12 2205 05/26/12 0330  AST 12 12  ALT 16 15  ALKPHOS 61 60  BILITOT 0.2* 0.2*  PROT 6.8 6.6  ALBUMIN 3.6 3.6   No results found for this basename: LIPASE, AMYLASE,  in the last 168 hours  Recent Labs Lab 05/25/12 2205 05/26/12 0330  AMMONIA 30 30   CBC:  Recent Labs Lab 05/25/12 2115 05/26/12 0330 05/27/12 0655  WBC 9.6 8.3 8.6  NEUTROABS 6.6  --   --   HGB 13.2 12.5 11.8*  HCT 39.7 37.7 36.0  MCV 86.5 87.1 87.8  PLT 260 221 207   Cardiac Enzymes: No results found for this basename: CKTOTAL, CKMB, CKMBINDEX, TROPONINI,  in the last 168 hours BNP (last 3 results) No results found for this basename: PROBNP,  in the last 8760 hours CBG:  Recent Labs Lab 05/26/12 1611 05/26/12 1932 05/27/12 0010 05/27/12 0454 05/27/12 0743  GLUCAP 162* 184* 149* 136* 144*    Recent Results (from the past 240 hour(s))  MRSA PCR SCREENING     Status: None   Collection Time    05/26/12  3:00 AM      Result Value Range Status   MRSA by PCR NEGATIVE  NEGATIVE Final   Comment:            The GeneXpert MRSA Assay (FDA     approved for NASAL specimens     only), is one component of a     comprehensive MRSA colonization     surveillance program. It is not     intended to diagnose MRSA     infection nor to guide or     monitor treatment for     MRSA infections.     Studies: Ct Head Wo Contrast  05/25/2012  *RADIOLOGY REPORT*  Clinical Data: Altered mental status.  Alcohol and occiput and ingestion. The patient is responsive to verbal stimulus.  CT HEAD WITHOUT CONTRAST  Technique:  Contiguous axial  images were obtained from the base of the skull through the vertex without contrast.  Comparison: 01/06/2012.  Findings: The ventricles and sulci are symmetrical without significant effacement, displacement, or dilatation. No mass effect or midline shift. No abnormal extra-axial fluid collections. The grey-white matter junction is distinct. Basal cisterns are not effaced. No acute intracranial hemorrhage. No depressed skull fractures.  Old bilateral medial orbital wall fractures, greater on the right with proptosis.  This is stable   since the previous study. Paranasal sinuses and mastoid air cells are not opacified.  IMPRESSION: No acute intracranial abnormalities.  Old bilateral medial orbital rim fractures with proptosis, stable.   Original Report Authenticated By: William Stevens, M.D.    Mr Brain Wo Contrast  05/26/2012  *RADIOLOGY REPORT*  Clinical Data: 56-year-old female with acute encephalopathy, confusion, altered mental status.  ICU patient recently weaned from ventilation assistance.  MRI HEAD WITHOUT CONTRAST  Technique:  Multiplanar, multiecho pulse sequences of the brain and surrounding structures were obtained according to standard protocol without intravenous contrast.  Comparison: Head CTs 05/25/2012 and earlier.  Findings: Study is intermittently degraded by motion artifact despite repeated imaging attempts.  Normal cerebral volume.No restricted diffusion to suggest acute infarction.  No midline shift, mass effect, evidence of mass lesion, ventriculomegaly, extra-axial collection or acute intracranial hemorrhage.  Cervicomedullary junction and pituitary are within normal limits.  Major intracranial vascular flow voids are preserved.  Tortuous distal cervical ICA.  Very mild periventricular white matter T2 and FLAIR hyperintensity, within normal limits for age.  Otherwise normal gray and white matter signal.  Grossly negative visualized cervical spine.  Chronic exophthalmos.  This appears related  to increased intraorbital fat.  Chronic right lamina papyracea fracture. Chronic left orbital floor fracture.  Minimal ethmoid sinus mucosal thickening.  Trace mastoid effusions.  Negative visualized nasopharynx.  Negative scalp soft tissues.  Normal bone marrow signal.  IMPRESSION: 1.  No acute intracranial abnormality.  Negative for age noncontrast MRI appearance of the brain. 2.  Chronic orbital fractures.  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   Original Report Authenticated By: H. Hall III, M.D.    Dg Chest Port 1 View  05/26/2012  *RADIOLOGY REPORT*  Clinical Data: History of sleep apnea, hypertension, COPD, diabetes, elevated CO2 levels  PORTABLE CHEST - 1 VIEW  Comparison: Chest x-ray of 12/13/2011  Findings: There is mild atelectasis at the left lung base.  No infiltrate or effusion is seen.  Mediastinal contours appear normal.  The heart is stable in size.  No skeletal abnormality is seen.  IMPRESSION: Mild left basilar atelectasis.  No active process.   Original Report Authenticated By: Paul Barry, M.D.     Scheduled Meds: . acyclovir  800 mg Oral q morning - 10a  . albuterol  2.5 mg Nebulization Q6H  . antiseptic oral rinse  15 mL Mouth Rinse q12n4p  . chlorhexidine  15 mL Mouth Rinse BID  . enoxaparin (LOVENOX) injection  40 mg Subcutaneous Q24H  . FLUoxetine  60 mg Oral q morning - 10a  . gabapentin  600 mg Oral TID  . influenza  inactive virus vaccine  0.5 mL Intramuscular Tomorrow-1000  . ipratropium  0.5 mg Nebulization Q6H  . levETIRAcetam  500 mg Oral BID  . levothyroxine  87 mcg Intravenous Daily  . nicotine  21 mg Transdermal Daily  . pantoprazole  40 mg Oral Daily  . pneumococcal 23 valent vaccine  0.5 mL Intramuscular Tomorrow-1000  . simvastatin  40 mg Oral QHS  . sodium chloride  3 mL Intravenous Q12H   Continuous Infusions: . dextrose 5 % and 0.9% NaCl 75 mL/hr (05/26/12 1501)    Principal Problem:   Acute  encephalopathy Active Problems:   OSA (obstructive sleep apnea)   Hypoglycemia   Diabetes mellitus   COPD (chronic obstructive pulmonary disease)   Hypothyroidism   Acute respiratory acidosis   Respiratory distress, acute   Graves' disease with exophthalmos     Depression   Generalized anxiety disorder   Diarrhea   Abdominal pain, unspecified site    Time spent: 40 MINS    Jadene Stemmer  [facility] Pager [phone]. If 8PM-8AM, please contact night-coverage at [email or web address], password [email or web address] 05/27/2012, 8:17 AM  LOS: 2 days             

## 2012-05-27 NOTE — Progress Notes (Signed)
Placed pt on nasal cpap for rest.  Initially placed settings at 15cm h2o per md's notes, however pt said it was too much and that they recently changed it back down to 10cm h2o after her last sleep study.  Therefore, cpap settings adjusted down to 10cm h2o.  Pt stated this pressure felt better and is tolerating well at this time.  RN notified.  Sterile water filled to fill line of humidity chamber.

## 2012-05-27 NOTE — Procedures (Signed)
EEG report.  Brief clinical history:  57 years old female with altered mental status. Concern for seizures as patient was found unresponsive foaming from his mouth. No prior history of frank epileptic seizures.  Technique: this is a 17 channel routine scalp EEG performed at the bedside with bipolar and monopolar montages arranged in accordance to the international 10/20 system of electrode placement. One channel was dedicated to EKG recording.  The study was performed during wakefulness, drowsiness, and stage 2 sleep. No activating procedures were utilized during the test.  Description:In the wakeful state, the best background consisted of a medium amplitude, posterior dominant, well sustained, symmetric and reactive 10 Hz rhythm. Drowsiness demonstrated dropout of the alpha rhythm. Stage 2 sleep showed symmetric and synchronous sleep spindles without intermixed epileptiform discharges. No focal or generalized epileptiform discharges noted.  No pathologic areas of slowing seen.  EKG showed sinus rhythm.  Impression: this is a normal awake and asleep EEG. Please, be aware that a normal EEG does not exclude the possibility of epilepsy.  Clinical correlation is advised.  Wyatt Portela, MD

## 2012-05-27 NOTE — Telephone Encounter (Signed)
ERROR

## 2012-05-27 NOTE — Progress Notes (Signed)
[facility] progress note. Complaint. Run of V. tach. History of present illness. This 57 year old female in hospital with acute encephalopathy unclear etiology. She also has a history of diabetes, obesity sleep apnea on BiPAP when necessary at bedtime. She is on telemetry and was noted to have a 35 beat run V. tach. The patient has no prior cardiac history. I came to see her at the bedside and she tells me that she does take antibiotics phylactically for dental procedures. She also told me that she is having this sensation of palpitations fairly regularly and has come to ignore the sensation.. She states she did feel  "my heart jumping" during this event. She denies any associated chest pain or dyspnea. Vital signs. Temperature 90.8, pulse 93, respiration 20, blood pressure 126/80. O2 sats 97%. General appearance. Moderately obese middle-aged female who is alert, cooperative, in no distress. Cardiac. Rate and rhythm regular. No murmur, S3, S4. Lungs. Breath sounds clear. Abdomen. Soft with positive bowel sounds. Impression/plan. Problem #1. 35 beat run of V. tach. I did order a 12-lead EKG and troponins now and then every 6 hours for 3 sets. I do not see that the patient is currently on a beta blocker and she has not been on a home beta blocker. I discussed the case with Dr. Jacinto Halim on-call cardiology and he requests we discontinued current antihypertensive medication which is hydralazine. Recommend  I start a beta blocker with Coreg 6.25 mg twice daily. I have discontinued the hydralazine and started the Coreg as requested. I note a recent magnesium level 05/26/12 was normal range at 2.0. Cardiology will see the patient in consult in the a.m.

## 2012-05-27 NOTE — Progress Notes (Signed)
Offsite EEG completed at [facility]. 

## 2012-05-28 DIAGNOSIS — I472 Ventricular tachycardia, unspecified: Secondary | ICD-10-CM | POA: Diagnosis not present

## 2012-05-28 LAB — BASIC METABOLIC PANEL
CO2: 27 mEq/L (ref 19–32)
Chloride: 102 mEq/L (ref 96–112)
Creatinine, Ser: 0.8 mg/dL (ref 0.50–1.10)
GFR calc Af Amer: >90 mL/min (ref >90)
Potassium: 3.9 mEq/L (ref 3.5–5.1)

## 2012-05-28 LAB — CBC
HCT: 34.5 % — ABNORMAL LOW (ref 36.0–46.0)
Hemoglobin: 11.3 g/dL — ABNORMAL LOW (ref 12.0–15.0)
MCV: 87.3 fL (ref 78.0–100.0)
WBC: 8.7 10*3/uL (ref 4.0–10.5)

## 2012-05-28 LAB — GLUCOSE, CAPILLARY: Glucose-Capillary: 109 mg/dL — ABNORMAL HIGH (ref 70–99)

## 2012-05-28 LAB — TROPONIN I: Troponin I: <0.3 ng/mL (ref <0.30)

## 2012-05-28 MED ORDER — BUSPIRONE HCL 10 MG PO TABS
10.0000 mg | ORAL_TABLET | Freq: Three times a day (TID) | ORAL | Status: DC
  Administered 2012-05-28 – 2012-05-31 (×9): 10 mg via ORAL
  Filled 2012-05-28 (×13): qty 1

## 2012-05-28 MED ORDER — METOCLOPRAMIDE HCL 5 MG/ML IJ SOLN
10.0000 mg | Freq: Once | INTRAMUSCULAR | Status: AC
  Administered 2012-05-28: 10 mg via INTRAVENOUS
  Filled 2012-05-28: qty 2

## 2012-05-28 MED ORDER — HYDROCODONE-ACETAMINOPHEN 5-325 MG PO TABS
1.0000 | ORAL_TABLET | ORAL | Status: DC | PRN
  Administered 2012-05-28 – 2012-05-31 (×11): 1 via ORAL
  Filled 2012-05-28 (×11): qty 1

## 2012-05-28 MED ORDER — ASPIRIN 325 MG PO TABS
325.0000 mg | ORAL_TABLET | Freq: Every day | ORAL | Status: DC
  Administered 2012-05-28 – 2012-05-31 (×3): 325 mg via ORAL
  Filled 2012-05-28 (×4): qty 1

## 2012-05-28 NOTE — Progress Notes (Signed)
[facility] PROGRESS NOTE  Vanessa [name] ZOX:[id number] DOB: 06/23/1955 DOA: 05/25/2012 PCP: [name], MD  Assessment/Plan:  #1 acute encephalopathy ?? etiology. Clinical improvement and per family close to baseline. Differential includes probable seizures as patient was noted to be confused and foaming at the mouth prior to admission. Per son 2 days ago patient was also found on the floor passed out and foaming at the mouth by her daughter. Differential also includes hypercarbia and hypoglycemia versus possible CVA. Patient is currently alert and oriented x3. Patient off the BiPAP. CT of the head is negative. MRI of the head is negativepending. EEG is negative. Patient started empirically on keppra per neurology. NSL IVF.  Neurology ff and appreciate input and rxcs..  #2 hypoglycemia/DM2 Likely secondary to combination of no oral intake per patient's son in the setting of glipizide. CBG have ranged from 135 - 233. Random cortisol level 15.4, and sulfonyurea panel pending. Hgb A1C = 6.4. Glipizide has been discontinued. Metformin has been discontinued. NSL IVF. Follow CBGs.   #3 35 beat run VTach ?? Etiology. Cardiac enzymes neg x 2. Will repeat magnesium level. Check 2 d echo. Resume 1/2 home dose buspar. Continue coreg as started yesterday. Cardiology consult pending.  #4 respiratory acidosis Questionable etiology. Maybe secondary to COPD, OSA combined with narcotics/ETOH. Patient off the BiPAP speaking in full sentences. Patient does have a history of COPD/OSA. No signs of acute COPD exacerbation. Continue nebs.  BIPAP PRN. PCCM ff and appreciate input and rxcs.    #5 Graves Disease status post radiation ablation per patient TSH is 3.08. Continue oral Synthroid . F/U as outpatient.  #6 HA CT head and MRI head negative. Reglan x 1 and vicodin x 1 . Follow.  #7 hypertension Stable. Patient started on coreg last night secondary to vtach. If further BP control is needed will  uptitrate coreg or start on ARB instead of  ACE inhibitor secondary to cough and pulmonary issues as per Dr. Teddy Spike office notes.  #8 COPD/OSA  patient is currently off the BiPAP. Patient noted to have a respiratory acidosis. Continue nebulizer treatments. Restart QVAR per PCCM. PCCM ff and appreciate input and rxcs. CPAP QHS.  #9 chronic back pain/abdominal pain Patient states abdominal pain is similar to her chronic pain and improved with resumption of pain medications.   #10 diarrhea Patient with no noted loose stools overnight. C. difficile PCR negative. Stool o and p pending. Follow.  #11 depression/anxiety Stable. Continue Prozac. Resume buspar at 1/2 home dose.  #12 tobacco abuse Tobacco cessation. Patient refusing nicotine patch.  #13 prophylaxis PPI for GI prophylaxis. Lovenox for DVT prophylaxis.   Code Status: Full Family Communication: Updated patient at bedside Disposition Plan: Home when medically stable.   Consultants:  Neurology: Dr. Leroy Kennedy 05/27/11  PCCM: Dr Delton Coombes 05/27/11  Cardiology pending  Procedures:  CT head 05/25/2012  MRI head 05/26/2012  EEG  05/27/12  Antibiotics:  None  HPI/Subjective: Patient is alert and oriented x3. Patient states abdominal pain improved. Patient c/o HA. Patient denies CP, NO SOB. Patient states has occasional palpitations at home. Patient with 35 beat run VTach last night.  Objective: Filed Vitals:   05/27/12 2258 05/28/12 0137 05/28/12 0525 05/28/12 0936  BP: 120/81  107/71   Pulse: 92  62   Temp: 97.8 F (36.6 C)  97.9 F (36.6 C)   TempSrc: Axillary  Oral   Resp: 21  20   Height:      Weight:  SpO2: 94% 97% 94% 96%    Intake/Output Summary (Last 24 hours) at 05/28/12 0957 Last data filed at 05/28/12 0600  Gross per 24 hour  Intake   2260 ml  Output   1650 ml  Net    610 ml   Filed Weights   05/26/12 0300 05/27/12 0500  Weight: 101.5 kg (223 lb 12.3 oz) 106 kg (233 lb 11 oz)     Exam:   General:  NAD.   Cardiovascular: RRR, no m/r/g. No JVD. No LE edema  Respiratory: CTAB. No wheezing , no rhonchi, no crackles  Abdomen: Soft, NTTP, nondistended, positive bowel sounds.  Data Reviewed: Basic Metabolic Panel:  Recent Labs Lab 05/25/12 2115 05/26/12 0330 05/26/12 1017 05/27/12 0655 05/28/12 0408  NA 137 137  --  136 138  K 3.8 3.7  --  4.0 3.9  CL 98 99  --  99 102  CO2 31 32  --  31 27  GLUCOSE 60* 80  --  173* 125*  BUN 10 9  --  5* 6  CREATININE 0.92 0.90  --  0.81 0.80  CALCIUM 9.1 8.6  --  8.6 8.5  MG  --   --  2.0  --   --   PHOS  --   --  3.4  --   --    Liver Function Tests:  Recent Labs Lab 05/25/12 2205 05/26/12 0330  AST 12 12  ALT 16 15  ALKPHOS 61 60  BILITOT 0.2* 0.2*  PROT 6.8 6.6  ALBUMIN 3.6 3.6   No results found for this basename: LIPASE, AMYLASE,  in the last 168 hours  Recent Labs Lab 05/25/12 2205 05/26/12 0330  AMMONIA 30 30   CBC:  Recent Labs Lab 05/25/12 2115 05/26/12 0330 05/27/12 0655 05/28/12 0408  WBC 9.6 8.3 8.6 8.7  NEUTROABS 6.6  --   --   --   HGB 13.2 12.5 11.8* 11.3*  HCT 39.7 37.7 36.0 34.5*  MCV 86.5 87.1 87.8 87.3  PLT 260 221 207 200   Cardiac Enzymes:  Recent Labs Lab 05/27/12 2155 05/28/12 0408  TROPONINI <0.30 <0.30   BNP (last 3 results) No results found for this basename: PROBNP,  in the last 8760 hours CBG:  Recent Labs Lab 05/27/12 1114 05/27/12 1635 05/27/12 2009 05/28/12 05/28/12 0358  GLUCAP 193* 233* 135* 164* 122*    Recent Results (from the past 240 hour(s))  MRSA PCR SCREENING     Status: None   Collection Time    05/26/12  3:00 AM      Result Value Range Status   MRSA by PCR NEGATIVE  NEGATIVE Final   Comment:            The GeneXpert MRSA Assay (FDA     approved for NASAL specimens     only), is one component of a     comprehensive MRSA colonization     surveillance program. It is not     intended to diagnose MRSA     infection nor  to guide or     monitor treatment for     MRSA infections.  CLOSTRIDIUM DIFFICILE BY PCR     Status: None   Collection Time    05/27/12  3:08 PM      Result Value Range Status   C difficile by pcr NEGATIVE  NEGATIVE Final     Studies: Mr Brain Wo Contrast  05/26/2012  *RADIOLOGY REPORT*  Clinical Data:  57 year old female with acute encephalopathy, confusion, altered mental status.  ICU patient recently weaned from ventilation assistance.  MRI HEAD WITHOUT CONTRAST  Technique:  Multiplanar, multiecho pulse sequences of the brain and surrounding structures were obtained according to standard protocol without intravenous contrast.  Comparison: Head CTs 05/25/2012 and earlier.  Findings: Study is intermittently degraded by motion artifact despite repeated imaging attempts.  Normal cerebral volume.No restricted diffusion to suggest acute infarction.  No midline shift, mass effect, evidence of mass lesion, ventriculomegaly, extra-axial collection or acute intracranial hemorrhage.  Cervicomedullary junction and pituitary are within normal limits.  Major intracranial vascular flow voids are preserved.  Tortuous distal cervical ICA.  Very mild periventricular white matter T2 and FLAIR hyperintensity, within normal limits for age.  Otherwise normal gray and white matter signal.  Grossly negative visualized cervical spine.  Chronic exophthalmos.  This appears related to increased intraorbital fat.  Chronic right lamina papyracea fracture. Chronic left orbital floor fracture.  Minimal ethmoid sinus mucosal thickening.  Trace mastoid effusions.  Negative visualized nasopharynx.  Negative scalp soft tissues.  Normal bone marrow signal.  IMPRESSION: 1.  No acute intracranial abnormality.  Negative for age noncontrast MRI appearance of the brain. 2.  Chronic orbital fractures.  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   Original Report Authenticated By: Erskine Speed,  M.D.     Scheduled Meds: . acyclovir  800 mg Oral q morning - 10a  . albuterol  2.5 mg Nebulization Q6H  . antiseptic oral rinse  15 mL Mouth Rinse q12n4p  . busPIRone  10 mg Oral TID  . carisoprodol  350 mg Oral Daily  . carvedilol  6.25 mg Oral BID WC  . chlorhexidine  15 mL Mouth Rinse BID  . enoxaparin (LOVENOX) injection  40 mg Subcutaneous Q24H  . FLUoxetine  60 mg Oral q morning - 10a  . fluticasone  2 puff Inhalation BID  . gabapentin  600 mg Oral TID  . HYDROcodone-acetaminophen  1 tablet Oral Q6H  . ipratropium  0.5 mg Nebulization Q6H  . levETIRAcetam  500 mg Oral BID  . levothyroxine  175 mcg Oral QAC breakfast  . metoCLOPramide (REGLAN) injection  10 mg Intravenous Once  . nicotine  21 mg Transdermal Daily  . pantoprazole  40 mg Oral Daily  . pneumococcal 23 valent vaccine  0.5 mL Intramuscular Tomorrow-1000  . simvastatin  40 mg Oral QHS  . sodium chloride  3 mL Intravenous Q12H   Continuous Infusions:    Principal Problem:   Acute encephalopathy Active Problems:   OSA (obstructive sleep apnea)   Hypoglycemia   Diabetes mellitus   COPD (chronic obstructive pulmonary disease)   Hypothyroidism   Acute respiratory acidosis   Respiratory distress, acute   Graves' disease with exophthalmos   Depression   Generalized anxiety disorder   Diarrhea   Abdominal pain, unspecified site   Chronic back pain   V-tach    Time spent: 53 MINS    [facility]  [facility] Pager [phone]. If 8PM-8AM, please contact night-coverage at [email or web address], password [email or web address] 05/28/2012, 9:57 AM  LOS: 3 days

## 2012-05-28 NOTE — Progress Notes (Signed)
Instructed patient on proper use of flovent inhaler with a spacer. Patient demonstrated good technique

## 2012-05-28 NOTE — Progress Notes (Signed)
Pt had a 35 beat run of V-tach. Pt asymptomatic. Vital signs stable. MD notified and given strip. Stat EKG performed MD aware of results, copy also in pt's chart. Pt now in NSR. New orders written and initiated.

## 2012-05-28 NOTE — Progress Notes (Signed)
  Echocardiogram 2D Echocardiogram has been performed.  Vanessa [name] 05/28/2012, 11:05 AM

## 2012-05-28 NOTE — Consult Note (Addendum)
CARDIOLOGY CONSULT NOTE  Patient ID: Vanessa [name] MRN: [id number] DOB/AGE: [id number] y.o.  Admit date: 05/25/2012 Referring Physician  Jerelyn Charles, MD Primary Physician:  [name], MD Reason for Consultation  Wide complex tachycardia  HPI: Patient is a [place] American female with prior history of cocaine/crack use, abstinent for the last 10 years, smokes about 2 packs of cigarettes a day, uses pain medications including codeine occasionally for back pain and neuropathy and drinks alcohol very rarely was admitted to the hospital on 05/25/2012 with altered mental status. She had walked over to her daughter place across her house and while she was sitting on the couch slumped and was found to be unconscious and was found to have frothy in her mouth. Per history from chart review and speaking to Dr. Jerelyn Charles, patient has had similar episode and was found laying on the floor she weeks ago. There is no history of incontinence, no history of prior stroke, no history of prior coronary artery disease or syncope. She was found to be profoundly hypoglycemic on admission which was corrected by giving D50. Her hemoglobin A1c was well controlled and patient is on 2 oral hypoglycemic agents. Yesterday patient had asymptomatic 30nd run of sustained ventricular tachycardia, one brief episode. She was asymptomatic without any chest pain or shortness of breath. Patient was in the bed when this episode happened and there is no reported symptomatology. This evening patient is sitting on her bed having her dinner and denies any prior chest pain although she has had occasional palpitations that last a few seconds and she attributes this to a hyperthyroid state. Recent the form of skipping heartbeats. She's never had sustained palpitations. She denies any PND although she has severe sleep apnea and uses CPAP on a regular basis and follows Dr. Marcelyn Bruins. There is no history of sudden cardiac death in the  family of premature coronary artery disease less than age 8 years of age.  Past Medical History  Diagnosis Date  . Hypertension   . Heart murmur   . Asthma   . Emphysema   . Diabetes mellitus   . Hyperlipidemia   . Allergic rhinitis   . Chronic headache   . OSA (obstructive sleep apnea)   . Neuropathy   . Grave's disease   . Herniated disc   . COPD (chronic obstructive pulmonary disease)   . Sleep apnea   . Menopause   . Hernia, umbilical   . Anxiety   . Depression   . Back pain, chronic   . Shingles   . Herniated disc   . Arthritis     rheumatoid  . 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 Generalized anxiety disorder 05/26/2012  . Chronic back pain 05/27/2012    Secondary to L4-5 herniated disc per patient     Past Surgical History  Procedure Laterality Date  . Cholecystectomy    . Tubal ligation    . Laparoscopy    . Foot surgery    . Eye surgery       Family History  Problem Relation Age of Onset  . Emphysema Mother   . Allergies Mother   . Allergies Daughter   . Asthma Mother   . Asthma Brother   . Heart disease Mother   . Rheum arthritis Mother   . Breast cancer Other     aunt  . Lung cancer Brother   . Throat cancer Brother  Social History: History   Social History  . Marital Status: Single    Spouse Name: N/A    Number of Children: 6  . Years of Education: N/A   Occupational History  . disabled. foster parent    Social History Main Topics  . Smoking status: Current Every Day Smoker -- 1.00 packs/day for 40 years    Types: Cigarettes  . Smokeless tobacco: Never Used  . Alcohol Use: No  . Drug Use: No  . Sexually Active: No   Other Topics Concern  . Not on file   Social History Narrative   Pt is separated and lives alone.           Prescriptions prior to admission  Medication Sig Dispense Refill  . acyclovir (ZOVIRAX) 400 MG tablet Take 800 mg by mouth every  morning.       Marland Kitchen albuterol (PROAIR HFA) 108 (90 BASE) MCG/ACT inhaler Inhale 2 puffs into the lungs every 6 (six) hours as needed. For COPD      . beclomethasone (QVAR) 80 MCG/ACT inhaler Inhale 2 puffs into the lungs every morning.       . busPIRone (BUSPAR) 10 MG tablet Take 20 mg by mouth 3 (three) times daily.      . carisoprodol (SOMA) 350 MG tablet Take 350 mg by mouth every morning.       . clotrimazole-betamethasone (LOTRISONE) cream Apply 1 application topically 2 (two) times daily. For face      . docusate sodium (COLACE) 100 MG capsule Take 200 mg by mouth daily as needed for constipation.       Marland Kitchen FLUoxetine (PROZAC) 20 MG capsule Take 60 mg by mouth every morning.       . gabapentin (NEURONTIN) 600 MG tablet Take 600 mg by mouth 3 (three) times daily.      Marland Kitchen glipiZIDE (GLUCOTROL XL) 10 MG 24 hr tablet Take 10 mg by mouth 2 (two) times daily.      . hydrochlorothiazide (HYDRODIURIL) 25 MG tablet Take 25 mg by mouth every morning.       Marland Kitchen HYDROcodone-acetaminophen (NORCO) 10-325 MG per tablet Take 1 tablet by mouth every 4 (four) hours as needed for pain.  20 tablet  0  . ibuprofen (ADVIL,MOTRIN) 200 MG tablet Take 800 mg by mouth every 6 (six) hours as needed for pain.      Marland Kitchen ipratropium-albuterol (DUONEB) 0.5-2.5 (3) MG/3ML SOLN Take 3 mLs by nebulization 4 (four) times daily - after meals and at bedtime. For COPD      . levothyroxine (SYNTHROID, LEVOTHROID) 175 MCG tablet Take 175 mcg by mouth every morning.       . lidocaine (LIDODERM) 5 % Place 1 patch onto the skin daily as needed. Remove & Discard patch within 12 hours or as directed by MD      . lisinopril (PRINIVIL,ZESTRIL) 5 MG tablet Take 5 mg by mouth every morning.      . magnesium hydroxide (MILK OF MAGNESIA) 400 MG/5ML suspension Take 30 mLs by mouth daily as needed. For constipation      . metFORMIN (GLUCOPHAGE) 1000 MG tablet Take 1,000 mg by mouth 2 (two) times daily.       . mirtazapine (REMERON) 45 MG tablet Take 45  mg by mouth at bedtime. For insomnia      . pantoprazole (PROTONIX) 40 MG tablet Take 40 mg by mouth daily.      . simvastatin (ZOCOR) 40 MG tablet Take 40 mg  by mouth at bedtime.         Scheduled Meds: . acyclovir  800 mg Oral q morning - 10a  . albuterol  2.5 mg Nebulization Q6H  . antiseptic oral rinse  15 mL Mouth Rinse q12n4p  . busPIRone  10 mg Oral TID  . carisoprodol  350 mg Oral Daily  . carvedilol  6.25 mg Oral BID WC  . chlorhexidine  15 mL Mouth Rinse BID  . enoxaparin (LOVENOX) injection  40 mg Subcutaneous Q24H  . FLUoxetine  60 mg Oral q morning - 10a  . fluticasone  2 puff Inhalation BID  . gabapentin  600 mg Oral TID  . ipratropium  0.5 mg Nebulization Q6H  . levETIRAcetam  500 mg Oral BID  . levothyroxine  175 mcg Oral QAC breakfast  . nicotine  21 mg Transdermal Daily  . pantoprazole  40 mg Oral Daily  . simvastatin  40 mg Oral QHS  . sodium chloride  3 mL Intravenous Q12H   Continuous Infusions:  PRN Meds:.acetaminophen, acetaminophen, albuterol, HYDROcodone-acetaminophen, LORazepam, magnesium hydroxide, ondansetron (ZOFRAN) IV, ondansetron  ROS: General: no fevers/chills/night sweats Eyes: no blurry vision, diplopia, or amaurosis ENT: no sore throat or hearing loss Resp: Has chronic dyspnea, has sleep apnea and is compliant with CPAP. CV: Occasional palpitations lasting a few seconds. No chest pain, prior syncope. No history to suggest claudication or TIA. GI: no abdominal pain, nausea, vomiting, diarrhea, or constipation GU: no dysuria, frequency, or hematuria Skin: no rash Neuro: no headache, numbness, tingling, or weakness of extremities Musculoskeletal: no joint pain or swelling Heme: no bleeding, DVT, or easy bruising Endo: no polydipsia or polyuria    Physical Exam: Blood pressure 118/73, pulse 79, temperature 98.5 F (36.9 C), temperature source Oral, resp. rate 20, height 5\' 9"  (1.753 m), weight 106 kg (233 lb 11 oz), SpO2 98.00%.   General  appearance: alert, cooperative, appears older than stated age, no distress and moderately obese Neck: no adenopathy, no carotid bruit, no JVD, supple, symmetrical, trachea midline and exophthalmos noted. Lungs: clear to auscultation bilaterally Heart: S1, S2 is normal there is a late systolic murmur at the apex without any midsystolic click. There is no gallop. Abdomen: soft, non-tender; bowel sounds normal; no masses,  no organomegaly Extremities: extremities normal, atraumatic, no cyanosis or edema Pulses: 2+ and symmetric   there is a faint left carotid artery bruit. Neurologic: Grossly normal  Labs:   Lab Results  Component Value Date   WBC 8.7 05/28/2012   HGB 11.3* 05/28/2012   HCT 34.5* 05/28/2012   MCV 87.3 05/28/2012   PLT 200 05/28/2012    Recent Labs Lab 05/26/12 0330  05/28/12 0408  NA 137  < > 138  K 3.7  < > 3.9  CL 99  < > 102  CO2 32  < > 27  BUN 9  < > 6  CREATININE 0.90  < > 0.80  CALCIUM 8.6  < > 8.5  PROT 6.6  --   --   BILITOT 0.2*  --   --   ALKPHOS 60  --   --   ALT 15  --   --   AST 12  --   --   GLUCOSE 80  < > 125*  < > = values in this interval not displayed. Lab Results  Component Value Date   TROPONINI <0.30 05/28/2012     EKG: 05/27/12  Normal sinus rhythm V- rate 94 beats a minute., left  atrial abnormality, septal infarct, old inferior and lateral ST segment depression, cannot rule out ischemia. Compared to 05/25/2012, inferior and lateral nonspecific ST segment changes are new. Normal QT interval.  Tele 05/27/12: NSVT and sustained VT at 20:00 hours approximately. Asymptomatic  Echocardiogram 05/28/2012: Normal left systolic function, grade 1 diastolic dysfunction. No significant valvular abnormality.  ASSESSMENT AND PLAN:  1. Syncope probably due to hypoglycemia leading to questionable seizure disorder. However patient had sustained and ventricular tachycardia, lasting approximately 30 seconds, no symptoms reported by nursing. Episode of syncope 2  weeks ago found laying on the floor with troponin amount. 2. COPD with 2 packs of cigarette use daily 3. Abnormal EKG with no ST segment changes in the inferior and lateral leads cannot exclude ischemia 4. History of prior to cocaine/crack use more than 10 years ago. Abstinent. 5. Faint left carotid bruit. 6. Objective sleep apnea on CPAP and compliant.  Recommendation: Extremely concerning wide-complex rhythm, would recommend cardiac catheterization to exclude myocardial ischemia and coronary artery disease as an etiology for her syncope although hypoglycemia may explain some of presentation the episode of nonsustained ventricular tachycardia occurred yesterday when patient was euglycemic. If cardiac catheterization does not reveal significant coronary artery disease, she will probably need EP evaluation prior to her discharge. I have discussed with the patient regarding proceeding with cardiac catheterization and discussed the risks, benefits and alternatives. Patient will watch video regarding the same. Patient consents to this. No changes in the medications were done today. I will aspirin 325 mg by mouth daily.  [name], MD 05/28/2012, 6:26 PM [facility] [place] Pager: [phone] Office: [phone] If no answer Cell [phone]   No VT episodes last night or this morning. Will see patient tomorrow and cardiac cath has been set up.

## 2012-05-28 NOTE — Progress Notes (Signed)
Patient stated earlier in shift that she likes to be placed on CPAP around the eleven o'clock hour. At this time, patient denies having any complaints. She is lying in bed using a laptop computer. She refuses CPAP at this time. She has agreed to call when she is ready for placement. VSS on room air.

## 2012-05-28 NOTE — Progress Notes (Signed)
Present at bedside with patient during assessment by cardiology. RN will provide patient with written information related to cardiac cath and smoking cessation.

## 2012-05-29 DIAGNOSIS — R55 Syncope and collapse: Secondary | ICD-10-CM | POA: Diagnosis present

## 2012-05-29 LAB — BASIC METABOLIC PANEL
BUN: 8 mg/dL (ref 6–23)
Chloride: 103 mEq/L (ref 96–112)
GFR calc non Af Amer: >90 mL/min (ref >90)
Glucose, Bld: 115 mg/dL — ABNORMAL HIGH (ref 70–99)
Potassium: 3.8 mEq/L (ref 3.5–5.1)

## 2012-05-29 LAB — GLUCOSE, CAPILLARY
Glucose-Capillary: 100 mg/dL — ABNORMAL HIGH (ref 70–99)
Glucose-Capillary: 121 mg/dL — ABNORMAL HIGH (ref 70–99)
Glucose-Capillary: 206 mg/dL — ABNORMAL HIGH (ref 70–99)
Glucose-Capillary: 118 mg/dL — ABNORMAL HIGH (ref 70–99)

## 2012-05-29 LAB — CBC
HCT: 35 % — ABNORMAL LOW (ref 36.0–46.0)
Hemoglobin: 11.3 g/dL — ABNORMAL LOW (ref 12.0–15.0)
MCH: 28.1 pg (ref 26.0–34.0)
MCHC: 32.3 g/dL (ref 30.0–36.0)
MCV: 87.1 fL (ref 78.0–100.0)

## 2012-05-29 NOTE — Progress Notes (Signed)
Patient watched education video on cardiac catherization and consent has been signed.

## 2012-05-29 NOTE — Progress Notes (Signed)
[facility] PROGRESS NOTE  Vanessa [name] YNW:[id number] DOB: 1955-11-09 DOA: 05/25/2012 PCP: Vanessa Livings, MD  Assessment/Plan:  #1 acute encephalopathy/Syncope ?? etiology. Clinical improvement and per family close to baseline. Differential includes probable seizures as patient was noted to be confused and foaming at the mouth prior to admission vs cardiac etiology with vtach. Per son 2 days ago patient was also found on the floor passed out and foaming at the mouth by her daughter. Differential also includes hypercarbia and hypoglycemia. Patient is currently alert and oriented x3. Patient off the BiPAP. CT of the head is negative. MRI of the head is negativepending. EEG is negative. Patient started empirically on keppra per neurology. NSL IVF.  Neurology ff and appreciate input and rxcs.Cardiology ff. Patient for cath tomorrow.  #2 hypoglycemia/DM2 Likely secondary to combination of no oral intake per patient's son in the setting of glipizide. CBG have ranged from 100 - 138. Random cortisol level 15.4, and sulfonyurea panel pending. Hgb A1C = 6.4. Glipizide has been discontinued. Metformin has been discontinued. NSL IVF. Follow CBGs.   #3 35 beat run VTach ?? Etiology. No noted Vtach overnight on telemetry. Cardiac enzymes neg x 2. Magnesium level = 1.9. 2 d echo with EF = 55-60 %. Patient endorses family hx of early CAD/DEATH before age 55. Patient with AMS/ syncope prior to admission.Contyinue 1/2 home dose buspar. Continue coreg. Cardiology following and patient for cardiac cath tomorrow and possible EP evaluation. Appreciate cardiology input and rxcs,.  #4 respiratory acidosis Questionable etiology. Maybe secondary to COPD, OSA combined with narcotics/ETOH. Patient off the BiPAP speaking in full sentences. Patient does have a history of COPD/OSA. No signs of acute COPD exacerbation. Continue nebs.  BIPAP PRN. F/U with Dr Shelle Iron as outpatient.  #5 Graves Disease status post radiation  ablation per patient TSH is 3.08. Continue oral Synthroid . F/U as outpatient.  #6 HA CT head and MRI head negative. Improved. Follow.  #7 hypertension Stable. Patient started on coreg  secondary to vtach. If further BP control is needed will uptitrate coreg or start on ARB instead of  ACE inhibitor secondary to cough and pulmonary issues as per Dr. Teddy Spike office notes.  #8 COPD/OSA  patient is currently off the BiPAP. Patient noted to have a respiratory acidosis. Continue nebulizer treatments. Continue QVAR per PCCM. PCCM ff and appreciate input and rxcs. CPAP QHS.  #9 chronic back pain/abdominal pain Patient states abdominal pain is similar to her chronic pain and improved with resumption of pain medications.   #10 diarrhea Patient with no noted loose stools overnight. C. difficile PCR negative. Stool o and p pending. Follow.  #11 depression/anxiety Stable. Continue Prozac. Continue buspar at 1/2 home dose.  #12 tobacco abuse Tobacco cessation. Patient refusing nicotine patch.  #13 prophylaxis PPI for GI prophylaxis. Lovenox for DVT prophylaxis.   Code Status: Full Family Communication: Updated patient and ex husband at bedside Disposition Plan: Home when medically stable.   Consultants:  Neurology: Dr. Leroy Kennedy 05/27/11  PCCM: Dr Delton Coombes 05/27/11  Cardiology: Dr Jacinto Halim 05/28/12  Procedures:  CT head 05/25/2012  MRI head 05/26/2012  EEG  05/27/12  Antibiotics:  None  HPI/Subjective: Patient is alert and oriented x3. Patient states abdominal pain improved. Patient denies CP, NO SOB. Patient states has occasional palpitations at home. Patient with 35 beat run VTach 2 nights ago. Patient anxious and wanting to smoke. Patient pensive about catherization.  Objective: Filed Vitals:   05/28/12 1428 05/28/12 1933 05/28/12 2120 05/29/12 0633  BP:  150/81 144/71  Pulse:   83 88  Temp:   99.4 F (37.4 C) 97.1 F (36.2 C)  TempSrc:   Oral Oral  Resp:   20 18  Height:       Weight:    104.146 kg (229 lb 9.6 oz)  SpO2: 98% 97% 96% 98%    Intake/Output Summary (Last 24 hours) at 05/29/12 0915 Last data filed at 05/29/12 1610  Gross per 24 hour  Intake    720 ml  Output      0 ml  Net    720 ml   Filed Weights   05/26/12 0300 05/27/12 0500 05/29/12 0633  Weight: 101.5 kg (223 lb 12.3 oz) 106 kg (233 lb 11 oz) 104.146 kg (229 lb 9.6 oz)    Exam:   General:  NAD.   Cardiovascular: RRR, no m/r/g. No JVD. No LE edema  Respiratory: CTAB. No wheezing , no rhonchi, no crackles  Abdomen: Soft, NTTP, nondistended, positive bowel sounds.  Data Reviewed: Basic Metabolic Panel:  Recent Labs Lab 05/25/12 2115 05/26/12 0330 05/26/12 1017 05/27/12 0655 05/28/12 0408 05/29/12 0457  NA 137 137  --  136 138 140  K 3.8 3.7  --  4.0 3.9 3.8  CL 98 99  --  99 102 103  CO2 31 32  --  31 27 31   GLUCOSE 60* 80  --  173* 125* 115*  BUN 10 9  --  5* 6 8  CREATININE 0.92 0.90  --  0.81 0.80 0.78  CALCIUM 9.1 8.6  --  8.6 8.5 9.1  MG  --   --  2.0  --  1.9  --   PHOS  --   --  3.4  --   --   --    Liver Function Tests:  Recent Labs Lab 05/25/12 2205 05/26/12 0330  AST 12 12  ALT 16 15  ALKPHOS 61 60  BILITOT 0.2* 0.2*  PROT 6.8 6.6  ALBUMIN 3.6 3.6   No results found for this basename: LIPASE, AMYLASE,  in the last 168 hours  Recent Labs Lab 05/25/12 2205 05/26/12 0330  AMMONIA 30 30   CBC:  Recent Labs Lab 05/25/12 2115 05/26/12 0330 05/27/12 0655 05/28/12 0408 05/29/12 0457  WBC 9.6 8.3 8.6 8.7 8.2  NEUTROABS 6.6  --   --   --   --   HGB 13.2 12.5 11.8* 11.3* 11.3*  HCT 39.7 37.7 36.0 34.5* 35.0*  MCV 86.5 87.1 87.8 87.3 87.1  PLT 260 221 207 200 214   Cardiac Enzymes:  Recent Labs Lab 05/27/12 2155 05/28/12 0408 05/28/12 1024  TROPONINI <0.30 <0.30 <0.30   BNP (last 3 results) No results found for this basename: PROBNP,  in the last 8760 hours CBG:  Recent Labs Lab 05/28/12 1609 05/28/12 1951 05/28/12 2346  05/29/12 0407 05/29/12 0821  GLUCAP 136* 138* 103* 100* 121*    Recent Results (from the past 240 hour(s))  MRSA PCR SCREENING     Status: None   Collection Time    05/26/12  3:00 AM      Result Value Range Status   MRSA by PCR NEGATIVE  NEGATIVE Final   Comment:            The GeneXpert MRSA Assay (FDA     approved for NASAL specimens     only), is one component of a     comprehensive MRSA colonization     surveillance program. It  is not     intended to diagnose MRSA     infection nor to guide or     monitor treatment for     MRSA infections.  CLOSTRIDIUM DIFFICILE BY PCR     Status: None   Collection Time    05/27/12  3:08 PM      Result Value Range Status   C difficile by pcr NEGATIVE  NEGATIVE Final     Studies: No results found.  Scheduled Meds: . acyclovir  800 mg Oral q morning - 10a  . albuterol  2.5 mg Nebulization Q6H  . antiseptic oral rinse  15 mL Mouth Rinse q12n4p  . aspirin  325 mg Oral Daily  . busPIRone  10 mg Oral TID  . carisoprodol  350 mg Oral Daily  . carvedilol  6.25 mg Oral BID WC  . chlorhexidine  15 mL Mouth Rinse BID  . enoxaparin (LOVENOX) injection  40 mg Subcutaneous Q24H  . FLUoxetine  60 mg Oral q morning - 10a  . fluticasone  2 puff Inhalation BID  . gabapentin  600 mg Oral TID  . ipratropium  0.5 mg Nebulization Q6H  . levETIRAcetam  500 mg Oral BID  . levothyroxine  175 mcg Oral QAC breakfast  . nicotine  21 mg Transdermal Daily  . pantoprazole  40 mg Oral Daily  . simvastatin  40 mg Oral QHS  . sodium chloride  3 mL Intravenous Q12H   Continuous Infusions:    Principal Problem:   Acute encephalopathy Active Problems:   OSA (obstructive sleep apnea)   Hypoglycemia   Diabetes mellitus   COPD (chronic obstructive pulmonary disease)   Hypothyroidism   Acute respiratory acidosis   Respiratory distress, acute   Graves' disease with exophthalmos   Depression   Generalized anxiety disorder   Diarrhea   Abdominal pain,  unspecified site   Chronic back pain   V-tach   Syncope    Time spent: 71 MINS    [facility]  [facility] Pager [phone]. If 8PM-8AM, please contact night-coverage at [email or web address], password [facility] 05/29/2012, 9:15 AM  LOS: 4 days

## 2012-05-29 NOTE — Progress Notes (Signed)
Pt requests CPAP machine be turned on and pt will self administer nasal mask when ready. RT will assist as needed.

## 2012-05-29 NOTE — Progress Notes (Addendum)
Visit with patient at this time for scheduled BD. Patient now agrees to use CPAP. She states she is ready for bed. Assisted with nasal apparatus. She states nasal mask is what she uses at home. Remains on CPAP pressure of 10cmH20.

## 2012-05-30 ENCOUNTER — Encounter (HOSPITAL_COMMUNITY)
Admission: EM | Disposition: A | Discharge status: Home / Self Care | Payer: Self-pay | Source: Home / Self Care | Attending: Internal Medicine

## 2012-05-30 ENCOUNTER — Inpatient Hospital Stay (HOSPITAL_COMMUNITY): Payer: Medicaid Other

## 2012-05-30 DIAGNOSIS — E039 Hypothyroidism, unspecified: Secondary | ICD-10-CM

## 2012-05-30 DIAGNOSIS — I472 Ventricular tachycardia, unspecified: Secondary | ICD-10-CM

## 2012-05-30 HISTORY — PX: LEFT HEART CATHETERIZATION WITH CORONARY ANGIOGRAM: SHX5451

## 2012-05-30 LAB — BASIC METABOLIC PANEL
Calcium: 9.1 mg/dL (ref 8.4–10.5)
GFR calc Af Amer: >90 mL/min (ref >90)
GFR calc non Af Amer: >90 mL/min (ref >90)
Sodium: 139 mEq/L (ref 135–145)

## 2012-05-30 LAB — CBC
Hemoglobin: 12.2 g/dL (ref 12.0–15.0)
MCH: 28.8 pg (ref 26.0–34.0)
MCV: 84.6 fL (ref 78.0–100.0)
Platelets: 199 10*3/uL (ref 150–400)
RBC: 3.95 MIL/uL (ref 3.87–5.11)
RBC: 4.23 MIL/uL (ref 3.87–5.11)
WBC: 8.7 10*3/uL (ref 4.0–10.5)

## 2012-05-30 LAB — GLUCOSE, CAPILLARY
Glucose-Capillary: 119 mg/dL — ABNORMAL HIGH (ref 70–99)
Glucose-Capillary: 108 mg/dL — ABNORMAL HIGH (ref 70–99)
Glucose-Capillary: 96 mg/dL (ref 70–99)
Glucose-Capillary: 132 mg/dL — ABNORMAL HIGH (ref 70–99)

## 2012-05-30 LAB — PROTIME-INR: Prothrombin Time: 14.7 seconds (ref 11.6–15.2)

## 2012-05-30 SURGERY — LEFT HEART CATHETERIZATION WITH CORONARY ANGIOGRAM
Anesthesia: LOCAL

## 2012-05-30 MED ORDER — SODIUM CHLORIDE 0.9 % IV SOLN: 1.0000 mL/kg/h | INTRAVENOUS | Status: AC

## 2012-05-30 MED ORDER — SODIUM CHLORIDE 0.9 % IV SOLN: 250.0000 mL | INTRAVENOUS | Status: DC | PRN

## 2012-05-30 MED ORDER — ATORVASTATIN CALCIUM 20 MG PO TABS
20.0000 mg | ORAL_TABLET | Freq: Every day | ORAL | Status: DC
  Administered 2012-05-30: 20 mg via ORAL
  Filled 2012-05-30 (×2): qty 1

## 2012-05-30 MED ORDER — SODIUM CHLORIDE 0.9 % IJ SOLN: 3.0000 mL | Freq: Two times a day (BID) | INTRAMUSCULAR | Status: DC

## 2012-05-30 MED ORDER — LORAZEPAM 1 MG PO TABS
1.0000 mg | ORAL_TABLET | Freq: Three times a day (TID) | ORAL | Status: DC | PRN
  Administered 2012-05-30 – 2012-05-31 (×2): 1 mg via ORAL
  Filled 2012-05-30 (×2): qty 1

## 2012-05-30 MED ORDER — SODIUM CHLORIDE 0.9 % IJ SOLN
3.0000 mL | Freq: Two times a day (BID) | INTRAMUSCULAR | Status: DC
  Administered 2012-05-30 – 2012-05-31 (×2): 3 mL via INTRAVENOUS

## 2012-05-30 MED ORDER — NICOTINE POLACRILEX 2 MG MT GUM
2.0000 mg | CHEWING_GUM | OROMUCOSAL | Status: DC | PRN
  Administered 2012-05-30: 2 mg via ORAL
  Filled 2012-05-30 (×2): qty 1

## 2012-05-30 MED ORDER — ENOXAPARIN SODIUM 30 MG/0.3ML ~~LOC~~ SOLN
40.0000 mg | SUBCUTANEOUS | Status: DC
  Administered 2012-05-31: 40 mg via SUBCUTANEOUS
  Filled 2012-05-30 (×2): qty 0.4

## 2012-05-30 MED ORDER — VERAPAMIL HCL 40 MG PO TABS
40.0000 mg | ORAL_TABLET | Freq: Three times a day (TID) | ORAL | Status: DC
  Administered 2012-05-30 – 2012-05-31 (×3): 40 mg via ORAL
  Filled 2012-05-30 (×6): qty 1

## 2012-05-30 MED ORDER — SODIUM CHLORIDE 0.9 % IV SOLN: INTRAVENOUS | Status: DC

## 2012-05-30 MED ORDER — GADOBENATE DIMEGLUMINE 529 MG/ML IV SOLN
32.0000 mL | Freq: Once | INTRAVENOUS | Status: AC
  Administered 2012-05-30: 32 mL via INTRAVENOUS

## 2012-05-30 MED ORDER — ACETAMINOPHEN 325 MG PO TABS: 650.0000 mg | ORAL_TABLET | ORAL | Status: DC | PRN

## 2012-05-30 MED ORDER — HEPARIN (PORCINE) IN NACL 2-0.9 UNIT/ML-% IJ SOLN
INTRAMUSCULAR | Status: AC
  Filled 2012-05-30: qty 1000

## 2012-05-30 MED ORDER — LIDOCAINE HCL (PF) 1 % IJ SOLN
INTRAMUSCULAR | Status: AC
  Filled 2012-05-30: qty 30

## 2012-05-30 MED ORDER — MIDAZOLAM HCL 2 MG/2ML IJ SOLN
INTRAMUSCULAR | Status: AC
  Filled 2012-05-30: qty 2

## 2012-05-30 MED ORDER — FENTANYL CITRATE 0.05 MG/ML IJ SOLN
50.0000 ug | Freq: Once | INTRAMUSCULAR | Status: AC
  Administered 2012-05-30: 50 ug via INTRAVENOUS

## 2012-05-30 MED ORDER — ONDANSETRON HCL 4 MG/2ML IJ SOLN: 4.0000 mg | Freq: Four times a day (QID) | INTRAMUSCULAR | Status: DC | PRN

## 2012-05-30 MED ORDER — ASPIRIN 81 MG PO CHEW
324.0000 mg | CHEWABLE_TABLET | ORAL | Status: DC
  Filled 2012-05-30: qty 4

## 2012-05-30 MED ORDER — ASPIRIN 81 MG PO CHEW
324.0000 mg | CHEWABLE_TABLET | ORAL | Status: AC
  Administered 2012-05-30: 324 mg via ORAL

## 2012-05-30 MED ORDER — NITROGLYCERIN 1 MG/10 ML FOR IR/CATH LAB
INTRA_ARTERIAL | Status: AC
  Filled 2012-05-30: qty 10

## 2012-05-30 MED ORDER — FENTANYL CITRATE 0.05 MG/ML IJ SOLN
INTRAMUSCULAR | Status: AC
  Filled 2012-05-30: qty 2

## 2012-05-30 MED ORDER — SODIUM CHLORIDE 0.9 % IJ SOLN: 3.0000 mL | INTRAMUSCULAR | Status: DC | PRN

## 2012-05-30 MED ORDER — VERAPAMIL HCL 2.5 MG/ML IV SOLN
INTRAVENOUS | Status: AC
  Filled 2012-05-30: qty 2

## 2012-05-30 NOTE — H&P (View-Only) (Signed)
[facility] PROGRESS NOTE  Aariel Ems ZOX:[id number] DOB: 1955-07-21 DOA: 05/25/2012 PCP: [name], MD  Assessment/Plan:  #1 acute encephalopathy ?? etiology. Clinical improvement and per family close to baseline. Differential includes probable seizures as patient was noted to be confused and foaming at the mouth prior to admission. Per son 2 days ago patient was also found on the floor passed out and foaming at the mouth by her daughter. Differential also includes hypercarbia and hypoglycemia versus possible CVA. Patient is currently alert and oriented x3. Patient off the BiPAP. CT of the head is negative. MRI of the head is negativepending. EEG is pending. Patient started empirically on keppra per neurology. Change D5 normal saline to NS and follow CBG. Neurology ff and appreciate input and rxcs..  #2 hypoglycemia/DM2 Likely secondary to combination of no oral intake yesterday per patient's son in the setting of glipizide. CBG have ranged from 60 - 173. Check random cortisol level and sulfonyurea panel. Check Hgb A1C. Glipizide has been discontinued. Metformin has been discontinued. Discontinue D5 normal saline. Follow CBGs. Follow.  #3 respiratory acidosis Questionable etiology. Maybe secondary to COPD, OSA combined with narcotics/ETOH. Patient off the BiPAP speaking in full sentences. Patient does have a history of COPD/OSA. No signs of acute COPD exacerbation. Continue nebs.  BIPAP PRN. PCCM ff and appreciate input and rxcs.    #4 Graves Disease status post radiation ablation per patient TSH is 3.08. Change IV Synthroid to oral home dose. F/U as outpatient.  #5 hypertension Stable. Continue to hold BP meds. Hydralazine PRN. 1 blood pressure medications are resumed we'll start on ARB is that of her ACE inhibitor secondary to cough and pulmonary issues as per Dr. Teddy Spike office notes.  #6 COPD/OSA  patient is currently off the BiPAP. Patient noted to have a respiratory  acidosis. Continue nebulizer treatments. Hold QVAR per PCCM. PCCM ff and appreciate input and rxcs. CPAP QHS.  #7 chronic back pain/abdominal pain Patient states abdominal pain is similar to her chronic pain and asking for pain medications. We'll resume patient's home pain regimen at half the dose and follow.  #8 diarrhea Patient with no noted loose stools overnight. C. difficile PCR pending. Stool studies pending. Follow.  #9 depression/anxiety Stable. Resume Prozac  #10 tobacco abuse Tobacco cessation. Nicotine patch.  #11 prophylaxis PPI for GI prophylaxis. Lovenox for DVT prophylaxis.   Code Status: Full Family Communication: Updated patient and son at bedside Disposition Plan: Home when medically stable.   Consultants:  Neurology: Dr. Leroy Kennedy 05/27/11  PCCM: Dr Delton Coombes 05/27/11  Procedures:  CT head 05/25/2012  MRI head 05/26/2012  EEG pending  Antibiotics:  None  HPI/Subjective: Patient is alert and oriented x3. Patient c/o diffuse abdominal pain going around to her back which she states is similar to her chronic pain and asking for her pain medications. Patient c/o losse stools prior to admission.   Objective: Filed Vitals:   05/27/12 0400 05/27/12 0500 05/27/12 0800 05/27/12 0810  BP: 124/74     Pulse: 78     Temp: 98.6 F (37 C)  97.5 F (36.4 C)   TempSrc: Axillary  Oral   Resp: 18     Height:      Weight:  106 kg (233 lb 11 oz)    SpO2: 100%   100%    Intake/Output Summary (Last 24 hours) at 05/27/12 0817 Last data filed at 05/27/12 0600  Gross per 24 hour  Intake   2705 ml  Output   3327 ml  Net   -622 ml   Filed Weights   05/26/12 0300 05/27/12 0500  Weight: 101.5 kg (223 lb 12.3 oz) 106 kg (233 lb 11 oz)    Exam:   General:  NAD.   Cardiovascular: RRR, no m/r/g. No JVD. No LE edema  Respiratory: CTAB. No wheezing , no rhonchi, no crackles  Abdomen: Soft, diffuse TTP, nondistended, positive bowel sounds.  Data Reviewed: Basic  Metabolic Panel:  Recent Labs Lab 05/25/12 2115 05/26/12 0330 05/26/12 1017 05/27/12 0655  NA 137 137  --  136  K 3.8 3.7  --  4.0  CL 98 99  --  99  CO2 31 32  --  31  GLUCOSE 60* 80  --  173*  BUN 10 9  --  5*  CREATININE 0.92 0.90  --  0.81  CALCIUM 9.1 8.6  --  8.6  MG  --   --  2.0  --   PHOS  --   --  3.4  --    Liver Function Tests:  Recent Labs Lab 05/25/12 2205 05/26/12 0330  AST 12 12  ALT 16 15  ALKPHOS 61 60  BILITOT 0.2* 0.2*  PROT 6.8 6.6  ALBUMIN 3.6 3.6   No results found for this basename: LIPASE, AMYLASE,  in the last 168 hours  Recent Labs Lab 05/25/12 2205 05/26/12 0330  AMMONIA 30 30   CBC:  Recent Labs Lab 05/25/12 2115 05/26/12 0330 05/27/12 0655  WBC 9.6 8.3 8.6  NEUTROABS 6.6  --   --   HGB 13.2 12.5 11.8*  HCT 39.7 37.7 36.0  MCV 86.5 87.1 87.8  PLT 260 221 207   Cardiac Enzymes: No results found for this basename: CKTOTAL, CKMB, CKMBINDEX, TROPONINI,  in the last 168 hours BNP (last 3 results) No results found for this basename: PROBNP,  in the last 8760 hours CBG:  Recent Labs Lab 05/26/12 1611 05/26/12 1932 05/27/12 0010 05/27/12 0454 05/27/12 0743  GLUCAP 162* 184* 149* 136* 144*    Recent Results (from the past 240 hour(s))  MRSA PCR SCREENING     Status: None   Collection Time    05/26/12  3:00 AM      Result Value Range Status   MRSA by PCR NEGATIVE  NEGATIVE Final   Comment:            The GeneXpert MRSA Assay (FDA     approved for NASAL specimens     only), is one component of a     comprehensive MRSA colonization     surveillance program. It is not     intended to diagnose MRSA     infection nor to guide or     monitor treatment for     MRSA infections.     Studies: Ct Head Wo Contrast  05/25/2012  *RADIOLOGY REPORT*  Clinical Data: Altered mental status.  Alcohol and occiput and ingestion. The patient is responsive to verbal stimulus.  CT HEAD WITHOUT CONTRAST  Technique:  Contiguous axial  images were obtained from the base of the skull through the vertex without contrast.  Comparison: 01/06/2012.  Findings: The ventricles and sulci are symmetrical without significant effacement, displacement, or dilatation. No mass effect or midline shift. No abnormal extra-axial fluid collections. The grey-white matter junction is distinct. Basal cisterns are not effaced. No acute intracranial hemorrhage. No depressed skull fractures.  Old bilateral medial orbital wall fractures, greater on the right with proptosis.  This is stable  since the previous study. Paranasal sinuses and mastoid air cells are not opacified.  IMPRESSION: No acute intracranial abnormalities.  Old bilateral medial orbital rim fractures with proptosis, stable.   Original Report Authenticated By: Burman Nieves, M.D.    Mr Brain 70 Contrast  05/26/2012  *RADIOLOGY REPORT*  Clinical Data: 57 year old female with acute encephalopathy, confusion, altered mental status.  ICU patient recently weaned from ventilation assistance.  MRI HEAD WITHOUT CONTRAST  Technique:  Multiplanar, multiecho pulse sequences of the brain and surrounding structures were obtained according to standard protocol without intravenous contrast.  Comparison: Head CTs 05/25/2012 and earlier.  Findings: Study is intermittently degraded by motion artifact despite repeated imaging attempts.  Normal cerebral volume.No restricted diffusion to suggest acute infarction.  No midline shift, mass effect, evidence of mass lesion, ventriculomegaly, extra-axial collection or acute intracranial hemorrhage.  Cervicomedullary junction and pituitary are within normal limits.  Major intracranial vascular flow voids are preserved.  Tortuous distal cervical ICA.  Very mild periventricular white matter T2 and FLAIR hyperintensity, within normal limits for age.  Otherwise normal gray and white matter signal.  Grossly negative visualized cervical spine.  Chronic exophthalmos.  This appears related  to increased intraorbital fat.  Chronic right lamina papyracea fracture. Chronic left orbital floor fracture.  Minimal ethmoid sinus mucosal thickening.  Trace mastoid effusions.  Negative visualized nasopharynx.  Negative scalp soft tissues.  Normal bone marrow signal.  IMPRESSION: 1.  No acute intracranial abnormality.  Negative for age noncontrast MRI appearance of the brain. 2.  Chronic orbital fractures.  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   Original Report Authenticated By: Erskine Speed, M.D.    Dg Chest Port 1 View  05/26/2012  *RADIOLOGY REPORT*  Clinical Data: History of sleep apnea, hypertension, COPD, diabetes, elevated CO2 levels  PORTABLE CHEST - 1 VIEW  Comparison: Chest x-ray of 12/13/2011  Findings: There is mild atelectasis at the left lung base.  No infiltrate or effusion is seen.  Mediastinal contours appear normal.  The heart is stable in size.  No skeletal abnormality is seen.  IMPRESSION: Mild left basilar atelectasis.  No active process.   Original Report Authenticated By: [name], M.D.     Scheduled Meds: . acyclovir  800 mg Oral q morning - 10a  . albuterol  2.5 mg Nebulization Q6H  . antiseptic oral rinse  15 mL Mouth Rinse q12n4p  . chlorhexidine  15 mL Mouth Rinse BID  . enoxaparin (LOVENOX) injection  40 mg Subcutaneous Q24H  . FLUoxetine  60 mg Oral q morning - 10a  . gabapentin  600 mg Oral TID  . influenza  inactive virus vaccine  0.5 mL Intramuscular Tomorrow-1000  . ipratropium  0.5 mg Nebulization Q6H  . levETIRAcetam  500 mg Oral BID  . levothyroxine  87 mcg Intravenous Daily  . nicotine  21 mg Transdermal Daily  . pantoprazole  40 mg Oral Daily  . pneumococcal 23 valent vaccine  0.5 mL Intramuscular Tomorrow-1000  . simvastatin  40 mg Oral QHS  . sodium chloride  3 mL Intravenous Q12H   Continuous Infusions: . dextrose 5 % and 0.9% NaCl 75 mL/hr (05/26/12 1501)    Principal Problem:   Acute  encephalopathy Active Problems:   OSA (obstructive sleep apnea)   Hypoglycemia   Diabetes mellitus   COPD (chronic obstructive pulmonary disease)   Hypothyroidism   Acute respiratory acidosis   Respiratory distress, acute   Graves' disease with exophthalmos  Depression   Generalized anxiety disorder   Diarrhea   Abdominal pain, unspecified site    Time spent: 8 MINS    [facility]  [facility] Pager [phone]. If 8PM-8AM, please contact night-coverage at [email or web address], password [facility] 05/27/2012, 8:17 AM  LOS: 2 days

## 2012-05-30 NOTE — Progress Notes (Signed)
CPAP set up in room. 10.0 cm H20 per pt. Home settings with FFM.  Pt. Will call when ready for use.

## 2012-05-30 NOTE — Consult Note (Signed)
ELECTROPHYSIOLOGY CONSULT NOTE  Patient ID: Vanessa [name] MRN: [id number], DOB/AGE: 55-Feb-1957   Admit date: 05/25/2012 Date of Consult: 05/30/2012  Primary Physician: [name], MD Primary Cardiologist: Jacinto Halim, MD Reason for Consultation: VT  History of Present Illness Vanessa [name] is a 57 year old woman with HTN, dyslipidemia, asthma, DM, prior cocaine use, hypothyroidism and OSA who was admitted on 05/25/2012 with altered mental status. On admission she was found to have hypoglycemia in addition to elevated CO levels by ABGs. Her urine drug screen with positive for opiates and benzodiazepines. Her family also reported alcohol use. There were multiple possible etiologies including hypoglycemia, hypercarbia, seizure, CVA or medication induced encephalopathy. Head CT, brain MRI and EEG were negative. On 05/27/2012, she had NSVT documented on telemetry. This was asymptomatic. CEs negative. Dr. Jacinto Halim was consulted and recommended further evaluation with a cardiac catheterization. This was performed this morning revealing normal coronary arteries and normal LV systolic function.   She is a long-standing history of flutters dating back she is which is found to be hyperthyroid. As she gives her history I am not altogether clear as to how much of fluttering she has had in the interim although she describes some. However, the episode of ventricular tachycardia noted 2/28 that occurred while she was lying in her bed using her computer was asymptomatic.  She also has a history of syncope. One episode of note occurred however when she jumped off the bed and ran into the door. She does have a history of some orthostatic lightheadedness.  Her family also expresses a concern about difficulty with arousal. The role of polypharmacy substance abuse may be contributing to this      Past Medical History Past Medical History  Diagnosis Date  . Hypertension   . Heart murmur   . Asthma   . Emphysema   .  Diabetes mellitus   . Hyperlipidemia   . Allergic rhinitis   . Chronic headache   . OSA (obstructive sleep apnea)   . Neuropathy   . Grave's disease   . Herniated disc   . COPD (chronic obstructive pulmonary disease)   . Sleep apnea   . Menopause   . Hernia, umbilical   . Anxiety   . Depression   . Back pain, chronic   . Shingles   . Herniated disc   . Arthritis     rheumatoid  . 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 Generalized anxiety disorder 05/26/2012  . Chronic back pain 05/27/2012    Secondary to L4-5 herniated disc per patient    Past Surgical History Past Surgical History  Procedure Laterality Date  . Cholecystectomy    . Tubal ligation    . Laparoscopy    . Foot surgery    . Eye surgery       Allergies/Intolerances Allergies  Allergen Reactions  . Ciprofloxacin Hives  . Penicillins Hives    Inpatient Medications . acyclovir  800 mg Oral q morning - 10a  . albuterol  2.5 mg Nebulization Q6H  . antiseptic oral rinse  15 mL Mouth Rinse q12n4p  . aspirin  325 mg Oral Daily  . atorvastatin  20 mg Oral QHS  . busPIRone  10 mg Oral TID  . carisoprodol  350 mg Oral Daily  . chlorhexidine  15 mL Mouth Rinse BID  . [START ON 05/31/2012] enoxaparin  40 mg Subcutaneous Q24H  . FLUoxetine  60 mg Oral  q morning - 10a  . fluticasone  2 puff Inhalation BID  . gabapentin  600 mg Oral TID  . ipratropium  0.5 mg Nebulization Q6H  . levETIRAcetam  500 mg Oral BID  . levothyroxine  175 mcg Oral QAC breakfast  . nicotine  21 mg Transdermal Daily  . pantoprazole  40 mg Oral Daily  . sodium chloride  3 mL Intravenous Q12H  . sodium chloride  3 mL Intravenous Q12H  . verapamil  40 mg Oral Q8H   . sodium chloride 1 mL/kg/hr (05/30/12 0837)    Family History Negative for premature CAD or SCD   Social History Social History  . Marital Status: Single   Occupational History  . disabled. foster parent      Social History Main Topics  . Smoking status: Current Every Day Smoker -- 1.00 packs/day for 40 years    Types: Cigarettes  . Smokeless tobacco: Never Used  . Alcohol Use: No - per family, she has been drinking alcohol  . Drug Use: No - per chart, prior cocaine use ~10 years ago   Social History Narrative   Pt is separated and lives alone.     Review of Systems  General: No chills, fever, night sweats or weight changes  Cardiovascular:  No chest pain, dyspnea on exertion, edema, orthopnea, palpitations, paroxysmal nocturnal dyspnea Dermatological: No rash, lesions or masses Respiratory: No cough, dyspnea Urologic: No hematuria, dysuria Abdominal: No nausea, vomiting, diarrhea, bright red blood per rectum, melena, or hematemesis Neurologic: No visual changes, weakness, changes in mental status All other systems reviewed and are otherwise negative except as noted above.  Physical Exam    Blood pressure 165/94, pulse 55, temperature 98.1 F (36.7 C), temperature source Oral, resp. rate 18, height 5\' 9"  (1.753 m), weight 229 lb 9.6 oz (104.146 kg), SpO2 93.00%.  General: Well developed 57 year old female in no acute distress. HEENT: Normocephalic, atraumatic. EOMs intact. Sclera nonicteric. Oropharynx clear.  Neck: Supple without bruits. No JVD. Lungs: Respirations regular and unlabored, CTA bilaterally. No wheezes, rales or rhonchi. Heart: RRR. S1, S2 present. No murmurs, rub, S3 or S4. Abdomen: Soft, non-tender, non-distended. BS present x 4 quadrants. No hepatosplenomegaly.  Extremities: No clubbing, cyanosis or edema. DP/PT/Radials 2+ and equal bilaterally. Psych: Normal affect. Neuro: Alert and oriented X 3. Moves all extremities spontaneously. Musculoskeletal: No kyphosis. Skin: Intact. Warm and dry. No rashes or petechiae in exposed areas.   Labs  Recent Labs  05/27/12 2155 05/28/12 0408 05/28/12 1024  TROPONINI <0.30 <0.30 <0.30   Lab Results  Component Value  Date   WBC 9.7 05/30/2012   HGB 12.2 05/30/2012   HCT 35.8* 05/30/2012   MCV 84.6 05/30/2012   PLT 203 05/30/2012    Recent Labs Lab 05/26/12 0330  05/30/12 0452 05/30/12 1106  NA 137  < > 139  --   K 3.7  < > 3.6  --   CL 99  < > 101  --   CO2 32  < > 29  --   BUN 9  < > 9  --   CREATININE 0.90  < > 0.78 0.77  CALCIUM 8.6  < > 9.1  --   PROT 6.6  --   --   --   BILITOT 0.2*  --   --   --   ALKPHOS 60  --   --   --   ALT 15  --   --   --  AST 12  --   --   --   GLUCOSE 80  < > 118*  --   < > = values in this interval not displayed.   Recent Labs  05/30/12 0452  INR 1.17   Mg level on admission 2.0   Radiology/Studies Ct Head Wo Contrast  05/25/2012   IMPRESSION: No acute intracranial abnormalities.  Old bilateral medial orbital rim fractures with proptosis, stable.     Mr Brain Wo Contrast  05/26/2012    IMPRESSION: 1.  No acute intracranial abnormality.  Negative for age noncontrast MRI appearance of the brain. 2.  Chronic orbital fractures.  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      Dg Chest Port 1 View  05/26/2012  IMPRESSION: Mild left basilar atelectasis.  No active process.       Cardiac catheterization today Hemodynamic data:  Left ventricular pressure was 128/15 with LVEDP of 18 mm mercury. Aortic pressure was 133/86 with a mean of 107 mm mercury. There was no pressure gradient across the aortic valve  Left ventricle: Performed in the RAO projection revealed LVEF of 60%. There was No MR. No wall motion abnormality.  Right coronary artery: The vessel is smooth, normal, Dominant.  Left main coronary artery is large and normal.  Circumflex coronary artery: A large vessel giving origin to a large obtuse marginal 1. It is smooth and normal.  LAD: LAD gives origin to several small diagonals. LAD Normal.  Impression  Normal coronary arteries, right dominant circulation, normal left systolic function.   Recommendation Will refer her for EP evaluation of ventricular tachycardia.    Echocardiogram  Study Conclusions - Procedure narrative: Transthoracic echocardiography. Image quality was adequate. The study was technically difficult. - Left ventricle: The cavity size was normal. There was moderate concentric hypertrophy. Systolic function was normal. The estimated ejection fraction was in the range of 55% to 60%. Regional wall motion abnormalities cannot be excluded. Doppler parameters are consistent with abnormal left ventricular relaxation (grade 1 diastolic dysfunction). The E/e' ratio is >10, suggesting elevated LV filling pressure. - Aortic valve: Mild regurgitation. - Mitral valve: Trivial regurgitation. - Atrial septum: Poorly visualized. - Inferior vena cava: The vessel was normal in size; the respirophasic diameter changes were in the normal range (=50%); findings are consistent with normal central venous pressure.   12-lead ECG    Telemetry  was reviewed. There 2 episodes of nonsustained tachycardia of about 30 beats. They appear to have a right bundle branch block superior axis morphology.   Assessment and Recommendations 1. VT-nonsustained-right bundle superior axis. The one episode that we have documented while she was awake was essentially asymptomatic although she was lying down at the time 2. Normal LV systolic function 3. Normal coronary arteries   The implications of ventricular tachycardia related to either symptoms o  as a marker for underlying structural heart disease. She has no of the former of which I can tell; hence our investigations are directed in the hope of detecting structural heart disease. To that end we'll undertake a signal averaged ECG as well as cardiac MRI.  In the event that these are normal, no further evaluation would be necessary.  Morphologically the right bundle branch block superior axis morphology is consistent with ventricular arrhythmias  emanating from the papillary muscle region and has been named [place] VT; is thought to be largely verapamil sensitive and if he becomes significantly symptomatic can be pursued by ablation  For now I  recommend  - d/c carvedilol - start verapamil 40 mg every 8 hours - signal averaged ECG - cardiac MRI

## 2012-05-30 NOTE — Progress Notes (Signed)
Patient ID: Vanessa [name]  female  ZOX:[id number]    DOB: 1956/03/03    DOA: 05/25/2012  PCP: [name], MD  Assessment/Plan:  Principal Problem:  acute encephalopathy/Syncope: unclear etiology, currently alert and oriented x3, off the BiPAP. CT of the head is negative. MRI of the head is negative. EEG is negative. Patient was started empirically on keppra by neurology. - will discuss with Neurology whether to continue Keppra at this point  Hypoglycemia/DM2: BS controlled  - Random cortisol level 15.4, and sulfonyurea panel pending. Hgb A1C = 6.4.    VTach  - 2 d echo with EF = 55-60 %.  - cardiology following, cath today EF 60%, no MR, no WMA - D/w EP, Dr Clide Cliff, recommended dc coreg, start on verapamil 40mg  Q8hrs - will need cardiac MRI, and signal averaged EKG, will follow further recommendations    respiratory acidosis with Hx of OSA/COPD AND NICOTINE ABUSE, on CPAP qhs - F/U with Dr Shelle Iron as outpatient.  - cont nebs   Graves Disease status post radiation ablation per patient   TSH is 3.08. Continue oral Synthroid . F/U as outpatient.    hypertension; stable    chronic back pain/abdominal pain  - Patient states abdominal pain is similar to her chronic pain and improved with resumption of pain medications.    Diarrhea  - improving, C. difficile PCR negative. Stool O/P pending.   Depression/anxiety  - Continue Prozac and buspar at 1/2 home dose   Tobacco abuse  - Patient currently belligerent and demanding to go out to smoke, otherwise will leave AMA. Her family is at bed side and trying to calm her. Explained to the patient in great detail that she just returned from the cardiac cath and cannot go out the facility to smoke. Nicotine patch was provided however patient said that it doesn't help her.  - will try nicorette gum  DVT Prophylaxis:  Code Status: Full  Disposition: not medically ready    Subjective: Agitated, wants to go out smoke, family at bed  side  Objective: Weight change:   Intake/Output Summary (Last 24 hours) at 05/30/12 1113 Last data filed at 05/29/12 2000  Gross per 24 hour  Intake    360 ml  Output      0 ml  Net    360 ml   Blood pressure 134/84, pulse 63, temperature 98.1 F (36.7 C), temperature source Oral, resp. rate 18, height 5\' 9"  (1.753 m), weight 104.146 kg (229 lb 9.6 oz), SpO2 97.00%.  Physical Exam: General: Alert and awake, oriented x3, agitated, NAD. CVS: S1-S2 clear, no murmur rubs or gallops Chest: clear to auscultation bilaterally, no wheezing, rales or rhonchi Abdomen: soft nontender, nondistended, normal bowel sounds Extremities: no cyanosis, clubbing or edema noted bilaterally   Lab Results: Basic Metabolic Panel:  Recent Labs Lab 05/26/12 1017  05/28/12 0408 05/29/12 0457 05/30/12 0452  NA  --   < > 138 140 139  K  --   < > 3.9 3.8 3.6  CL  --   < > 102 103 101  CO2  --   < > 27 31 29   GLUCOSE  --   < > 125* 115* 118*  BUN  --   < > 6 8 9   CREATININE  --   < > 0.80 0.78 0.78  CALCIUM  --   < > 8.5 9.1 9.1  MG 2.0  --  1.9  --   --   PHOS 3.4  --   --   --   --   < > =  values in this interval not displayed. Liver Function Tests:  Recent Labs Lab 05/25/12 2205 05/26/12 0330  AST 12 12  ALT 16 15  ALKPHOS 61 60  BILITOT 0.2* 0.2*  PROT 6.8 6.6  ALBUMIN 3.6 3.6    Recent Labs Lab 05/25/12 2205 05/26/12 0330  AMMONIA 30 30   CBC:  Recent Labs Lab 05/25/12 2115  05/29/12 0457 05/30/12 0452  WBC 9.6  < > 8.2 8.7  NEUTROABS 6.6  --   --   --   HGB 13.2  < > 11.3* 11.3*  HCT 39.7  < > 35.0* 34.1*  MCV 86.5  < > 87.1 86.3  PLT 260  < > 214 199  < > = values in this interval not displayed. Cardiac Enzymes:  Recent Labs Lab 05/27/12 2155 05/28/12 0408 05/28/12 1024  TROPONINI <0.30 <0.30 <0.30   BNP: No components found with this basename: POCBNP,  CBG:  Recent Labs Lab 05/29/12 1956 05/30/12 0024 05/30/12 0407 05/30/12 0716 05/30/12 0830   GLUCAP 118* 142* 119* 105* 120*     Micro Results: Recent Results (from the past 240 hour(s))  MRSA PCR SCREENING     Status: None   Collection Time    05/26/12  3:00 AM      Result Value Range Status   MRSA by PCR NEGATIVE  NEGATIVE Final   Comment:            The GeneXpert MRSA Assay (FDA     approved for NASAL specimens     only), is one component of a     comprehensive MRSA colonization     surveillance program. It is not     intended to diagnose MRSA     infection nor to guide or     monitor treatment for     MRSA infections.  STOOL CULTURE     Status: None   Collection Time    05/27/12  3:07 PM      Result Value Range Status   Specimen Description PERIRECTAL   Final   Special Requests NONE   Final   Culture NO SUSPICIOUS COLONIES, CONTINUING TO HOLD   Final   Report Status PENDING   Incomplete  CLOSTRIDIUM DIFFICILE BY PCR     Status: None   Collection Time    05/27/12  3:08 PM      Result Value Range Status   C difficile by pcr NEGATIVE  NEGATIVE Final    Studies/Results: Ct Head Wo Contrast  05/25/2012  *RADIOLOGY REPORT*  Clinical Data: Altered mental status.  Alcohol and occiput and ingestion. The patient is responsive to verbal stimulus.  CT HEAD WITHOUT CONTRAST  Technique:  Contiguous axial images were obtained from the base of the skull through the vertex without contrast.  Comparison: 01/06/2012.  Findings: The ventricles and sulci are symmetrical without significant effacement, displacement, or dilatation. No mass effect or midline shift. No abnormal extra-axial fluid collections. The grey-white matter junction is distinct. Basal cisterns are not effaced. No acute intracranial hemorrhage. No depressed skull fractures.  Old bilateral medial orbital wall fractures, greater on the right with proptosis.  This is stable since the previous study. Paranasal sinuses and mastoid air cells are not opacified.  IMPRESSION: No acute intracranial abnormalities.  Old  bilateral medial orbital rim fractures with proptosis, stable.   Original Report Authenticated By: Burman Nieves, M.D.    Mr Brain Wo Contrast  05/26/2012  *RADIOLOGY REPORT*  Clinical Data: 57 year old female with  acute encephalopathy, confusion, altered mental status.  ICU patient recently weaned from ventilation assistance.  MRI HEAD WITHOUT CONTRAST  Technique:  Multiplanar, multiecho pulse sequences of the brain and surrounding structures were obtained according to standard protocol without intravenous contrast.  Comparison: Head CTs 05/25/2012 and earlier.  Findings: Study is intermittently degraded by motion artifact despite repeated imaging attempts.  Normal cerebral volume.No restricted diffusion to suggest acute infarction.  No midline shift, mass effect, evidence of mass lesion, ventriculomegaly, extra-axial collection or acute intracranial hemorrhage.  Cervicomedullary junction and pituitary are within normal limits.  Major intracranial vascular flow voids are preserved.  Tortuous distal cervical ICA.  Very mild periventricular white matter T2 and FLAIR hyperintensity, within normal limits for age.  Otherwise normal gray and white matter signal.  Grossly negative visualized cervical spine.  Chronic exophthalmos.  This appears related to increased intraorbital fat.  Chronic right lamina papyracea fracture. Chronic left orbital floor fracture.  Minimal ethmoid sinus mucosal thickening.  Trace mastoid effusions.  Negative visualized nasopharynx.  Negative scalp soft tissues.  Normal bone marrow signal.  IMPRESSION: 1.  No acute intracranial abnormality.  Negative for age noncontrast MRI appearance of the brain. 2.  Chronic orbital fractures.  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   Original Report Authenticated By: Erskine Speed, M.D.    Dg Chest Port 1 View  05/26/2012  *RADIOLOGY REPORT*  Clinical Data: History of sleep apnea, hypertension,  COPD, diabetes, elevated CO2 levels  PORTABLE CHEST - 1 VIEW  Comparison: Chest x-ray of 12/13/2011  Findings: There is mild atelectasis at the left lung base.  No infiltrate or effusion is seen.  Mediastinal contours appear normal.  The heart is stable in size.  No skeletal abnormality is seen.  IMPRESSION: Mild left basilar atelectasis.  No active process.   Original Report Authenticated By: [name], M.D.     Medications: Scheduled Meds: . acyclovir  800 mg Oral q morning - 10a  . albuterol  2.5 mg Nebulization Q6H  . antiseptic oral rinse  15 mL Mouth Rinse q12n4p  . aspirin  325 mg Oral Daily  . busPIRone  10 mg Oral TID  . carisoprodol  350 mg Oral Daily  . carvedilol  6.25 mg Oral BID WC  . chlorhexidine  15 mL Mouth Rinse BID  . [START ON 05/31/2012] enoxaparin  40 mg Subcutaneous Q24H  . FLUoxetine  60 mg Oral q morning - 10a  . fluticasone  2 puff Inhalation BID  . gabapentin  600 mg Oral TID  . ipratropium  0.5 mg Nebulization Q6H  . levETIRAcetam  500 mg Oral BID  . levothyroxine  175 mcg Oral QAC breakfast  . nicotine  21 mg Transdermal Daily  . pantoprazole  40 mg Oral Daily  . simvastatin  40 mg Oral QHS  . sodium chloride  3 mL Intravenous Q12H  . sodium chloride  3 mL Intravenous Q12H      LOS: 5 days   RAI,RIPUDEEP M.D. [facility] 05/30/2012, 11:13 AM Pager: [phone]  If 7PM-7AM, please contact night-coverage [email or web address] Password [email or web address]

## 2012-05-30 NOTE — Significant Event (Signed)
Dr. Jacinto Halim notified of VT event... Asymptomatic... EKG obtained... No new orders

## 2012-05-30 NOTE — Interval H&P Note (Signed)
History and Physical Interval Note:  05/30/2012 7:51 AM  Vanessa [name]  has presented today for surgery, with the diagnosis of cp  The various methods of treatment have been discussed with the patient and family. After consideration of risks, benefits and other options for treatment, the patient has consented to  Procedure(s): LEFT HEART CATHETERIZATION WITH CORONARY ANGIOGRAM (N/A) and possible angioplasty as a surgical intervention .  The patient's history has been reviewed, patient examined, no change in status, stable for surgery.  I have reviewed the patient's chart and labs.  Questions were answered to the patient's satisfaction.     [name]

## 2012-05-30 NOTE — CV Procedure (Signed)
Procedure performed:  Left heart catheterization including hemodynamic monitoring of the left ventricle, LV gram, selective right and left coronary arteriography.  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  Hence is brought to the cardiac catheterization lab to evaluate the  coronary anatomy for definitive diagnosis of CAD.  Hemodynamic data:  Left ventricular pressure was 128/15 with LVEDP of 18 mm mercury. Aortic pressure was 133/86 with a mean of 107 mm mercury. There was no pressure gradient across the aortic valve  Left ventricle: Performed in the RAO projection revealed LVEF of 60%. There was No MR. No wall motion abnormality.  Right coronary artery: The vessel is smooth, normal,  Dominant.  Left main coronary artery is large and normal.  Circumflex coronary artery: A large vessel giving origin to a large obtuse marginal 1. It is smooth and normal.   LAD:  LAD gives origin to several small diagonals.  LAD Normal.  Impression #1 normal coronary arteries, right dominant circulation, normal left systolic function. Will refer her for EP evaluation. This is being done to evaluate  ventricular tachycardia.   Technique: Under sterile precautions using a 6 French right radial  arterial access, a 6 French sheath was introduced into the right radial artery. A 5 [place] Tig 4 catheter was advanced into the ascending aorta selective  right coronary artery and left coronary artery was cannulated and angiography was performed in multiple views. The catheter was pulled back Out of the body over exchange length J-wire.  Same Catheter was used to perform LV gram which was performed in LAO projection.  Catheter exchanged out of the body over J-Wire. NO immediate complications noted. Patient tolerated the procedure well. Hemostasis was  obtained by applying TR band.

## 2012-05-31 DIAGNOSIS — R55 Syncope and collapse: Secondary | ICD-10-CM

## 2012-05-31 DIAGNOSIS — E119 Type 2 diabetes mellitus without complications: Secondary | ICD-10-CM

## 2012-05-31 DIAGNOSIS — R109 Unspecified abdominal pain: Secondary | ICD-10-CM

## 2012-05-31 DIAGNOSIS — F3289 Other specified depressive episodes: Secondary | ICD-10-CM

## 2012-05-31 DIAGNOSIS — R197 Diarrhea, unspecified: Secondary | ICD-10-CM

## 2012-05-31 DIAGNOSIS — G8929 Other chronic pain: Secondary | ICD-10-CM

## 2012-05-31 DIAGNOSIS — F329 Major depressive disorder, single episode, unspecified: Secondary | ICD-10-CM

## 2012-05-31 DIAGNOSIS — J984 Other disorders of lung: Secondary | ICD-10-CM

## 2012-05-31 DIAGNOSIS — F411 Generalized anxiety disorder: Secondary | ICD-10-CM

## 2012-05-31 DIAGNOSIS — M549 Dorsalgia, unspecified: Secondary | ICD-10-CM

## 2012-05-31 LAB — GLUCOSE, CAPILLARY

## 2012-05-31 LAB — STOOL CULTURE

## 2012-05-31 MED ORDER — LEVETIRACETAM 500 MG PO TABS: 500.0000 mg | ORAL_TABLET | Freq: Two times a day (BID) | ORAL | Status: DC

## 2012-05-31 MED ORDER — BUSPIRONE HCL 10 MG PO TABS: 10.0000 mg | ORAL_TABLET | Freq: Three times a day (TID) | ORAL | Status: DC

## 2012-05-31 MED ORDER — ASPIRIN 81 MG PO TBEC: 81.0000 mg | DELAYED_RELEASE_TABLET | Freq: Every day | ORAL | Status: DC

## 2012-05-31 MED ORDER — NICOTINE 21 MG/24HR TD PT24: 1.0000 | MEDICATED_PATCH | Freq: Every day | TRANSDERMAL | Status: DC

## 2012-05-31 MED ORDER — VERAPAMIL HCL 40 MG PO TABS: 40.0000 mg | ORAL_TABLET | Freq: Three times a day (TID) | ORAL | Status: DC

## 2012-05-31 NOTE — Progress Notes (Signed)
RT in room. Pt. Is wearing CPAP at this time. Pt. Stated she is able to place on/off.

## 2012-05-31 NOTE — Progress Notes (Signed)
Patient: Vanessa [name] Date of Encounter: 05/31/2012, 8:49 AM Admit date: 05/25/2012     Subjective  Ms. Vanessa [name] denies any cardiac complaints this AM. She denies CP, SOB or palpitations. She had HA last night which is now gone.    Objective  Physical Exam: Vitals: BP 145/78  Pulse 63  Temp(Src) 98.2 F (36.8 C) (Oral)  Resp 20  Ht 5\' 9"  (1.753 m)  Wt 229 lb 9.6 oz (104.146 kg)  BMI 33.89 kg/m2  SpO2 97% General: Well developed, well appearing 57 year old female in no acute distress. Neck: Supple. JVD not elevated. Lungs: Clear bilaterally to auscultation without wheezes, rales, or rhonchi. Breathing is unlabored. Heart: RRR S1 S2 without murmurs, rubs, or gallops.  Abdomen: Soft, non-distended. Extremities: No clubbing or cyanosis. No edema.   Neuro: Alert and oriented X 3. Moves all extremities spontaneously. No focal deficits.  Intake/Output: No intake or output data in the 24 hours ending 05/31/12 0849  Inpatient Medications:  . acyclovir  800 mg Oral q morning - 10a  . albuterol  2.5 mg Nebulization Q6H  . antiseptic oral rinse  15 mL Mouth Rinse q12n4p  . aspirin  325 mg Oral Daily  . atorvastatin  20 mg Oral QHS  . busPIRone  10 mg Oral TID  . carisoprodol  350 mg Oral Daily  . chlorhexidine  15 mL Mouth Rinse BID  . enoxaparin  40 mg Subcutaneous Q24H  . FLUoxetine  60 mg Oral q morning - 10a  . fluticasone  2 puff Inhalation BID  . gabapentin  600 mg Oral TID  . ipratropium  0.5 mg Nebulization Q6H  . levETIRAcetam  500 mg Oral BID  . levothyroxine  175 mcg Oral QAC breakfast  . nicotine  21 mg Transdermal Daily  . pantoprazole  40 mg Oral Daily  . sodium chloride  3 mL Intravenous Q12H  . sodium chloride  3 mL Intravenous Q12H  . verapamil  40 mg Oral Q8H    Labs:  Recent Labs  05/29/12 0457 05/30/12 0452 05/30/12 1106  NA 140 139  --   K 3.8 3.6  --   CL 103 101  --   CO2 31 29  --   GLUCOSE 115* 118*  --   BUN 8 9  --     CREATININE 0.78 0.78 0.77  CALCIUM 9.1 9.1  --     Recent Labs  05/30/12 0452 05/30/12 1106  WBC 8.7 9.7  HGB 11.3* 12.2  HCT 34.1* 35.8*  MCV 86.3 84.6  PLT 199 203    Recent Labs  05/28/12 1024  TROPONINI <0.30    Recent Labs  05/30/12 0452  INR 1.17    Radiology/Studies: Ct Head Wo Contrast  05/25/2012  *RADIOLOGY REPORT*  Clinical Data: Altered mental status.  Alcohol and occiput and ingestion. The patient is responsive to verbal stimulus.  CT HEAD WITHOUT CONTRAST  Technique:  Contiguous axial images were obtained from the base of the skull through the vertex without contrast.  Comparison: 01/06/2012.  Findings: The ventricles and sulci are symmetrical without significant effacement, displacement, or dilatation. No mass effect or midline shift. No abnormal extra-axial fluid collections. The grey-white matter junction is distinct. Basal cisterns are not effaced. No acute intracranial hemorrhage. No depressed skull fractures.  Old bilateral medial orbital wall fractures, greater on the right with proptosis.  This is stable since the previous study. Paranasal sinuses and mastoid air cells are not opacified.  IMPRESSION: No acute intracranial abnormalities.  Old bilateral medial orbital rim fractures with proptosis, stable.   Original Report Authenticated By: Burman Nieves, M.D.    Mr Brain 26 Contrast  05/26/2012  *RADIOLOGY REPORT*  Clinical Data: 57 year old female with acute encephalopathy, confusion, altered mental status.  ICU patient recently weaned from ventilation assistance.  MRI HEAD WITHOUT CONTRAST  Technique:  Multiplanar, multiecho pulse sequences of the brain and surrounding structures were obtained according to standard protocol without intravenous contrast.  Comparison: Head CTs 05/25/2012 and earlier.  Findings: Study is intermittently degraded by motion artifact despite repeated imaging attempts.  Normal cerebral volume.No restricted diffusion to suggest acute  infarction.  No midline shift, mass effect, evidence of mass lesion, ventriculomegaly, extra-axial collection or acute intracranial hemorrhage.  Cervicomedullary junction and pituitary are within normal limits.  Major intracranial vascular flow voids are preserved.  Tortuous distal cervical ICA.  Very mild periventricular white matter T2 and FLAIR hyperintensity, within normal limits for age.  Otherwise normal gray and white matter signal.  Grossly negative visualized cervical spine.  Chronic exophthalmos.  This appears related to increased intraorbital fat.  Chronic right lamina papyracea fracture. Chronic left orbital floor fracture.  Minimal ethmoid sinus mucosal thickening.  Trace mastoid effusions.  Negative visualized nasopharynx.  Negative scalp soft tissues.  Normal bone marrow signal.  IMPRESSION: 1.  No acute intracranial abnormality.  Negative for age noncontrast MRI appearance of the brain. 2.  Chronic orbital fractures.  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   Original Report Authenticated By: Erskine Speed, M.D.    Dg Chest Port 1 View  05/26/2012  *RADIOLOGY REPORT*  Clinical Data: History of sleep apnea, hypertension, COPD, diabetes, elevated CO2 levels  PORTABLE CHEST - 1 VIEW  Comparison: Chest x-ray of 12/13/2011  Findings: There is mild atelectasis at the left lung base.  No infiltrate or effusion is seen.  Mediastinal contours appear normal.  The heart is stable in size.  No skeletal abnormality is seen.  IMPRESSION: Mild left basilar atelectasis.  No active process.   Original Report Authenticated By: [name], M.D.     Cardiac MRI: report pending Telemetry: SR; no further VT SAECG Normal    Assessment and Plan  1. VT-nonsustained-right bundle superior axis, asymptomatic 2. Normal LV systolic function  3. Normal coronary arteries Continue verapamil. Await cardiac MRI results.   Signed, [name], BROOKE PA-C  SAECG normal    If MRI normal would discharge on verapamil and await symptomatic recurrences  followup with [facility] .  EP f/u if needed

## 2012-05-31 NOTE — Discharge Summary (Signed)
Physician Discharge Summary  Patient ID: Vanessa [name] MRN: [id number] DOB/AGE: 11/25/1955 57 y.o.  Admit date: 05/25/2012 Discharge date: 05/31/2012  Primary Care Physician:  [name], MD  Discharge Diagnoses:    . Acute encephalopathy . OSA (obstructive sleep apnea) . Hypoglycemia . Diabetes mellitus . COPD (chronic obstructive pulmonary disease) . Hypothyroidism . Acute respiratory acidosis . VT-nonsustained-right bundle superior axis, asymptomatic . Graves' disease with exophthalmos . Depression . Generalized anxiety disorder . Diarrhea . Chronic back pain . Syncope   Consults:  Cardiology                    EP, Dr Graciela Husbands                    Neurology  Discharge Instructions:   Discharge Medications:   Medication List    STOP taking these medications       lisinopril 5 MG tablet  Commonly known as:  PRINIVIL,ZESTRIL      TAKE these medications       acyclovir 400 MG tablet  Commonly known as:  ZOVIRAX  Take 800 mg by mouth every morning.     aspirin 81 MG EC tablet  Commonly known as:  aspirin EC  Take 1 tablet (81 mg total) by mouth daily. Swallow whole.     beclomethasone 80 MCG/ACT inhaler  Commonly known as:  QVAR  Inhale 2 puffs into the lungs every morning.     busPIRone 10 MG tablet  Commonly known as:  BUSPAR  Take 1 tablet (10 mg total) by mouth 3 (three) times daily.     carisoprodol 350 MG tablet  Commonly known as:  SOMA  Take 350 mg by mouth every morning.     clotrimazole-betamethasone cream  Commonly known as:  LOTRISONE  Apply 1 application topically 2 (two) times daily. For face     docusate sodium 100 MG capsule  Commonly known as:  COLACE  Take 200 mg by mouth daily as needed for constipation.     gabapentin 600 MG tablet  Commonly known as:  NEURONTIN  Take 600 mg by mouth 3 (three) times daily.     glipiZIDE 10 MG 24 hr tablet  Commonly known as:  GLUCOTROL XL  Take 10 mg by mouth 2 (two) times daily.     hydrochlorothiazide 25 MG tablet  Commonly known as:  HYDRODIURIL  Take 25 mg by mouth every morning.     HYDROcodone-acetaminophen 10-325 MG per tablet  Commonly known as:  NORCO  Take 1 tablet by mouth every 4 (four) hours as needed for pain.     ibuprofen 200 MG tablet  Commonly known as:  ADVIL,MOTRIN  Take 800 mg by mouth every 6 (six) hours as needed for pain.     ipratropium-albuterol 0.5-2.5 (3) MG/3ML Soln  Commonly known as:  DUONEB  Take 3 mLs by nebulization 4 (four) times daily - after meals and at bedtime. For COPD     levETIRAcetam 500 MG tablet  Commonly known as:  KEPPRA  Take 1 tablet (500 mg total) by mouth 2 (two) times daily.     levothyroxine 175 MCG tablet  Commonly known as:  SYNTHROID, LEVOTHROID  Take 175 mcg by mouth every morning.     lidocaine 5 %  Commonly known as:  LIDODERM  Place 1 patch onto the skin daily as needed. Remove & Discard patch within 12 hours or as directed by MD     magnesium hydroxide  400 MG/5ML suspension  Commonly known as:  MILK OF MAGNESIA  Take 30 mLs by mouth daily as needed. For constipation     metFORMIN 1000 MG tablet  Commonly known as:  GLUCOPHAGE  Take 1,000 mg by mouth 2 (two) times daily.     mirtazapine 45 MG tablet  Commonly known as:  REMERON  Take 45 mg by mouth at bedtime. For insomnia     nicotine 21 mg/24hr patch  Commonly known as:  NICODERM CQ - dosed in mg/24 hours  Place 1 patch onto the skin daily.     pantoprazole 40 MG tablet  Commonly known as:  PROTONIX  Take 40 mg by mouth daily.     PROAIR HFA 108 (90 BASE) MCG/ACT inhaler  Generic drug:  albuterol  Inhale 2 puffs into the lungs every 6 (six) hours as needed. For COPD     PROZAC 20 MG capsule  Generic drug:  FLUoxetine  Take 60 mg by mouth every morning.     simvastatin 40 MG tablet  Commonly known as:  ZOCOR  Take 40 mg by mouth at bedtime.     verapamil 40 MG tablet  Commonly known as:  CALAN  Take 1 tablet (40 mg total) by  mouth 3 (three) times daily.         Brief H and P: For complete details please refer to admission H and P, but in brief, Vanessa [name] is a 57 y.o. female with history of diabetes mellitus type 2, COPD, OSA was brought to the ER after patient's family found the patient has become altered mental status. As per patient's son who provided most of the history patient had come out of her house to take her dog for walking when she met her sister who lives across. She suddenly became confused and she sat on the couch and her mouth started forming patient became completely altered in mental status. She since then has been drowsy. In the ER patient was found to be hypoglycemic. Patient was given D50 one ampule after which her CBGs have increased 100 but repeat CBCs have shown a drop in sugar levels. Initial ABG shows elevated carbon dioxide levels. Patient at this time has been admitted for altered mental status. As per patient's son patient has been having some confusion over last 2-3 days. Urine drug screen is positive for opiates and benzodiazepine and patient's son feels that these are prescribed medications. Patient does take occasionally alcohol and patient's son feels that she may have had some alcohol yesterday. Patient is arousable and moves all extremities but is not oriented to place or time. She is oriented to her name and recognizes her son.    Hospital Course:   acute encephalopathy/Syncope: unclear etiology, currently alert and oriented x3, at her baseline mental status, differential diagnosis includes probable seizure as patient was noted to be confused in foaming at the mouth prior to the admission versus cardiac arrhythmias secondary to V. tach. Patient was placed in step-down unit at the time of admission and is currently off the BiPAP. CT of the head is negative. MRI of the head was negative for any acute stroke, EEG was negative for any acute epileptiform activity. Neurology was  consulted and patient was empirically started on Keppra. Per neurology, patient may need ambulatory 48-72 hour EEG searching for the possibility of epileptiform discharges as dysrhythmias with secondary syncope can be manifestation of temporal lobe seizures. I have placed an ambulatory referral to neurology for outpatient  follow-up.  Hypoglycemia/DM2: BS controlled, Hgb A1C = 6.4. Likely secondary to combination of no oral intake per patient's son in the setting of glipizide and metformin. Patient is currently eating fine during the hospitalization.   VTach:  2 d echo showed EF = 55-60 %. Cardiology was consulted and patient underwent cardiac catheterization which showed EF of 60%, no MR, no WMA. EP cardiology was consulted and recommended starting patient on verapamil 40mg  Q8hrs. per EP cardiology, signal averaged EKG was normal and cardiac MRI was done which showed normal LV size and function, EF of 59%, normal RV size and systolic function with no evidence of ARVC, no definitive evidence of prior MI, infiltrative disease or any myocarditis. Patient was cleared by cardiology to be discharged home.  respiratory acidosis with Hx of OSA/COPD AND NICOTINE ABUSE, on CPAP qhs,  F/U with Dr Shelle Iron as outpatient has been arranged.  Graves Disease status post radiation ablation per patient  TSH is 3.08. Continue oral Synthroid . F/U as outpatient.   hypertension; stable   chronic back pain/abdominal pain  - Patient states abdominal pain is similar to her chronic pain and improved with resumption of pain medications.   Diarrhea : Improving, C. difficile was negative  Depression/anxiety  - Continue Prozac and buspar at 1/2 home dose   Tobacco abuse - patient was strongly counseled against nicotine.    Day of Discharge BP 145/78  Pulse 63  Temp(Src) 98.2 F (36.8 C) (Oral)  Resp 20  Ht 5\' 9"  (1.753 m)  Wt 104.146 kg (229 lb 9.6 oz)  BMI 33.89 kg/m2  SpO2 97%  Physical Exam: General: Alert  and awake oriented x3 not in any acute distress. CVS: S1-S2 clear no murmur rubs or gallops Chest: clear to auscultation bilaterally, no wheezing rales or rhonchi Abdomen: soft nontender, nondistended, normal bowel sounds, Extremities: no cyanosis, clubbing or edema noted bilaterally    The results of significant diagnostics from this hospitalization (including imaging, microbiology, ancillary and laboratory) are listed below for reference.    LAB RESULTS: Basic Metabolic Panel:  Recent Labs Lab 05/26/12 1017  05/28/12 0408 05/29/12 0457 05/30/12 0452 05/30/12 1106  NA  --   < > 138 140 139  --   K  --   < > 3.9 3.8 3.6  --   CL  --   < > 102 103 101  --   CO2  --   < > 27 31 29   --   GLUCOSE  --   < > 125* 115* 118*  --   BUN  --   < > 6 8 9   --   CREATININE  --   < > 0.80 0.78 0.78 0.77  CALCIUM  --   < > 8.5 9.1 9.1  --   MG 2.0  --  1.9  --   --   --   PHOS 3.4  --   --   --   --   --   < > = values in this interval not displayed. Liver Function Tests:  Recent Labs Lab 05/25/12 2205 05/26/12 0330  AST 12 12  ALT 16 15  ALKPHOS 61 60  BILITOT 0.2* 0.2*  PROT 6.8 6.6  ALBUMIN 3.6 3.6   No results found for this basename: LIPASE, AMYLASE,  in the last 168 hours  Recent Labs Lab 05/25/12 2205 05/26/12 0330  AMMONIA 30 30   CBC:  Recent Labs Lab 05/25/12 2115  05/30/12 0452 05/30/12 1106  WBC 9.6  < > 8.7 9.7  NEUTROABS 6.6  --   --   --   HGB 13.2  < > 11.3* 12.2  HCT 39.7  < > 34.1* 35.8*  MCV 86.5  < > 86.3 84.6  PLT 260  < > 199 203  < > = values in this interval not displayed. Cardiac Enzymes:  Recent Labs Lab 05/28/12 0408 05/28/12 1024  TROPONINI <0.30 <0.30   BNP: No components found with this basename: POCBNP,  CBG:  Recent Labs Lab 05/31/12 0730 05/31/12 1119  GLUCAP 131* 143*    Significant Diagnostic Studies:  No results found.  2D ECHO:   Disposition and Follow-up:     Discharge Orders   Future Appointments  Provider Department Dept Phone   06/03/2012 3:30 PM Julio Sicks, NP [facility] [phone]   Future Orders Complete By Expires     Ambulatory referral to Neurology  As directed     Comments:      For possible seizures, started on keppra during this hospitalization    Diet - low sodium heart healthy  As directed     Increase activity slowly  As directed         DISPOSITION: Home DIET: Heart healthy ACTIVITY: As tolerated   DISCHARGE FOLLOW-UP Follow-up Information   Follow up with Barbaraann Share, MD On 06/03/2012. (3: 30pm )    Contact information:   [address] [place] [address] [phone]       Follow up with [facility], MD. Schedule an appointment as soon as possible for a visit in 2 weeks. (for hospital follow-up)    Contact information:   [address] [place] [address] [phone]       Follow up with Sherryl Manges, MD. Schedule an appointment as soon as possible for a visit in 3 weeks. (FOR FOLLOW-UP)    Contact information:   [address] [place] [address] [phone]       Time spent on Discharge: 35 mins  Signed:   RAI,RIPUDEEP M.D. [facility] 05/31/2012, 12:48 PM Pager: [phone]

## 2012-05-31 NOTE — Progress Notes (Signed)
NEURO HOSPITALIST PROGRESS NOTE   SUBJECTIVE:                                                                                                                        Complains of headache after MRI  Yesterday. No further spells of alteration of consciousness. MRI brain and routine EEG unremarkable. Cardiac cath and ECHO unimpressive. Had EPS. On keppra 500 mg BID without noticeable side effects.   OBJECTIVE:                                                                                                                           Vital signs in last 24 hours: Temp:  [97.9 F (36.6 C)-98.8 F (37.1 C)] 98.2 F (36.8 C) (03/04 0600) Pulse Rate:  [55-81] 63 (03/04 0600) Resp:  [18-20] 20 (03/04 0600) BP: (134-165)/(78-94) 145/78 mmHg (03/04 0600) SpO2:  [92 %-100 %] 97 % (03/04 0600)  Intake/Output from previous day:   Intake/Output this shift:   Nutritional status: Carb Control  Past Medical History  Diagnosis Date  . Hypertension   . Heart murmur   . Asthma   . Emphysema   . Diabetes mellitus   . Hyperlipidemia   . Allergic rhinitis   . Chronic headache   . OSA (obstructive sleep apnea)   . Neuropathy   . Grave's disease   . Herniated disc   . COPD (chronic obstructive pulmonary disease)   . Sleep apnea   . Menopause   . Hernia, umbilical   . Anxiety   . Depression   . Back pain, chronic   . Shingles   . Herniated disc   . Arthritis     rheumatoid  . 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 Generalized anxiety disorder 05/26/2012  . Chronic back pain 05/27/2012    Secondary to L4-5 herniated disc per patient    Neurologic ROS negative with exception of above.   Neurologic Exam:  Mental Status:  Alert, oriented, thought content appropriate. Speech fluent without evidence of aphasia. Able to follow 3 step commands without difficulty.  Cranial Nerves:  II:  Discs flat bilaterally; Visual fields grossly normal, pupils equal, round, reactive  to light and accommodation  III,IV, VI: ptosis not present, extra-ocular shows normal on the right and decreased ability to look down on the left. Both eyes are proptotic.  V,VII: smile symmetric, facial light touch sensation normal bilaterally  VIII: hearing normal bilaterally  IX,X: gag reflex present  XI: bilateral shoulder shrug  XII: midline tongue extension  Motor:  Right : Upper extremity 5/5 Left: Upper extremity 5/5  Lower extremity 5/5 Lower extremity 5/5  Tone and bulk:normal tone throughout; no atrophy noted  Sensory: Pinprick and light touch intact throughout, bilaterally  Deep Tendon Reflexes: 2+ and symmetric throughout  Plantars:  Right: downgoing Left: downgoing  Cerebellar:  normal finger-to-nose, normal heel-to-shin test  CV: pulses palpable throughout      Lab Results: No results found for this basename: cbc, bmp, coags, chol, tri, ldl, hga1c   Lipid Panel No results found for this basename: CHOL, TRIG, HDL, CHOLHDL, VLDL, LDLCALC,  in the last 72 hours  Studies/Results: No results found.  MEDICATIONS                                                                                                                       I have reviewed the patient's current medications.  ASSESSMENT/PLAN:                                                                                                           Vanessa [name] is asymptomatic from a neurological standpoint at this moment. Awaiting final results electrophysiological studies. Will suggest keeping patient in low dose of keppra for now. If EPS normal show no evidence of structural heart disease, then she will need an ambulatory 48-72 hour EEG searching for the possibility of epileptiform discharges, as dysrhythmias with secondary syncope can be a manifestation of temporal lobe seizures.  Wyatt Portela, MD  [facility] [phone]  05/31/2012, 7:34 AM

## 2012-05-31 NOTE — Progress Notes (Signed)
Pt discharged to home per MD order. Pt received and reviewed all discharge instructions and medication information including follow-up appointments and prescriptions. Pt verbalized understanding. Pt alert and oriented at discharge, pt states she is still having constant diffuse body and chest pain.  Pt is on chronic narcotics at home. MD aware. No new orders received.  Pt escorted to private vehicle via wheelchair by guest services. Efraim Kaufmann

## 2012-06-03 ENCOUNTER — Inpatient Hospital Stay: Payer: Medicaid Other | Admitting: Pulmonary Disease

## 2012-06-03 ENCOUNTER — Inpatient Hospital Stay: Payer: Medicaid Other | Admitting: Adult Health

## 2012-06-06 ENCOUNTER — Telehealth: Payer: Self-pay | Admitting: Pulmonary Disease

## 2012-06-06 NOTE — Telephone Encounter (Signed)
I called # listed was advised pt was not there. Then phone was d/c. Will call back later

## 2012-06-07 NOTE — Telephone Encounter (Signed)
I spoke with pt. She stated she has a resmed and it has a chip she can take out. I advised her she can bring the chip in so we can download the machine. She needed nothing further

## 2012-06-08 ENCOUNTER — Encounter: Payer: Self-pay | Admitting: Pulmonary Disease

## 2012-06-08 ENCOUNTER — Ambulatory Visit (INDEPENDENT_AMBULATORY_CARE_PROVIDER_SITE_OTHER): Payer: Medicaid Other | Admitting: Pulmonary Disease

## 2012-06-08 VITALS — BP 110/60 | HR 84 | Temp 99.6°F | Ht 69.0 in | Wt 226.2 lb

## 2012-06-08 DIAGNOSIS — G4733 Obstructive sleep apnea (adult) (pediatric): Secondary | ICD-10-CM

## 2012-06-08 NOTE — Patient Instructions (Addendum)
Will setup for a mask fitting over at the sleep center.   Would like to see you back in office in 4 weeks, and have you bring your machine.  Will do another download on the new mask.  Work on weight loss Be careful with the amount of sedating medication that you are taking.  This can worsen your sleep apnea.

## 2012-06-08 NOTE — Progress Notes (Signed)
  Subjective:    Patient ID: Vanessa [name], female    DOB: [birth year], 57 y.o.   MRN: [id number]  HPI The patient comes in today for followup of her obstructive sleep apnea after a recent hospitalization.  She presented there with acute encephalopathy, and was noted to be hypercarbic with a pCO2 in the 60s.  This was felt to be secondary to her known sleep apnea, as well as alcohol intake with narcotics and other sedating medications.  She was seen by one of my partners during the hospitalization and not felt to have a COPD exacerbation.  She comes in today where she is having difficulties with her CPAP.  Despite wearing the device compliantly by her history, she is awakening with breakthrough apnea.  I have done a download today which shows her compliance over the last 3 months being only 56% greater than or equal to 4 hours.  She does have breakthrough apnea with an AHI of 11 events per hour.  Finally, she was found to have a very large mask leak.  The patient is currently wearing a CPAP mask from the hospital, and I explained to her these are meant to only be used short term while in the hospital.   Review of Systems  Constitutional: Negative for fever and unexpected weight change.  HENT: Negative for ear pain, nosebleeds, congestion, sore throat, rhinorrhea, sneezing, trouble swallowing, dental problem, postnasal drip and sinus pressure.   Eyes: Negative for redness and itching.       Watery eyes  Respiratory: Positive for cough ( a lot of mucus). Negative for chest tightness, shortness of breath and wheezing.   Cardiovascular: Negative for palpitations and leg swelling.       Right leg knot above knee---painful  Gastrointestinal: Negative for nausea and vomiting.  Genitourinary: Negative for dysuria.  Musculoskeletal: Negative for joint swelling.  Skin: Negative for rash.  Neurological: Negative for headaches.  Hematological: Does not bruise/bleed easily.  Psychiatric/Behavioral:  Negative for dysphoric mood. The patient is not nervous/anxious.        Objective:   Physical Exam Obese female in no acute distress Nose without purulence or discharge noted No skin breakdown or pressure necrosis from the CPAP mask Neck without LN or TMG Chest with mild decrease in BS, no wheezing Lower extremity with mild edema, cyanosis Mildly confused, but answers questions fairly appropriately.  Moves all 4 extremities without deficit       Assessment & Plan:

## 2012-06-08 NOTE — Assessment & Plan Note (Signed)
The patient has had a recent hospitalization with acute encephalopathy probably related to polysubstance use and poorly controlled obstructive sleep apnea.  She was noted to have hypercarbia during the hospitalization, but does not have a significantly elevated serum bicarbonate at baseline.  Her downloaded today shows that we are not controlling her obstructive sleep apnea, and this is primarily because of a poorly fitting mask with significant leaks.  I would like to have her go to the sleep center for a formal mask fitting, and we will been downloaded her machine again.  We can always recheck her pressure again on the automatic setting if needed.  I have stressed to her the importance of CPAP compliance, and also trying to minimize sedating medications.  I also reminded her of her moral obligation to not drive if sleepy this is an issue.

## 2012-06-09 ENCOUNTER — Telehealth: Payer: Self-pay | Admitting: Internal Medicine

## 2012-06-09 NOTE — Telephone Encounter (Signed)
Tried to reach pt at number provided. Received message that call would not go through and to try again later. Will continue to try and reach pt. No other numbers listed for pt.

## 2012-06-09 NOTE — Telephone Encounter (Signed)
New problem   S/p procedure about a week ago.    C/O possible  blood clot on right front side in the center . Knot . Having pain down her leg / back .

## 2012-06-09 NOTE — Telephone Encounter (Signed)
Have tried 2 more times to reach pt and received same message that call will not go through

## 2012-06-10 LAB — MISCELLANEOUS TEST

## 2012-06-10 NOTE — Telephone Encounter (Signed)
Line rings a fast busy  

## 2012-06-13 NOTE — Telephone Encounter (Signed)
Tried again to reach pt. Received message that call would not go through

## 2012-06-14 ENCOUNTER — Ambulatory Visit (HOSPITAL_BASED_OUTPATIENT_CLINIC_OR_DEPARTMENT_OTHER): Payer: Medicaid Other | Attending: Pulmonary Disease

## 2012-06-14 NOTE — Telephone Encounter (Signed)
Returned call to patient at # [phone],call will not go through.Patient's sister Dois Davenport called she stated she will have patient call office.

## 2012-06-20 ENCOUNTER — Emergency Department (HOSPITAL_COMMUNITY)
Admission: EM | Admit: 2012-06-20 | Discharge: 2012-06-20 | Disposition: A | Discharge status: Home / Self Care | Payer: Medicaid Other | Attending: Emergency Medicine | Admitting: Emergency Medicine

## 2012-06-20 ENCOUNTER — Emergency Department (HOSPITAL_COMMUNITY): Payer: Medicaid Other

## 2012-06-20 ENCOUNTER — Encounter (HOSPITAL_COMMUNITY): Payer: Self-pay

## 2012-06-20 DIAGNOSIS — Z8739 Personal history of other diseases of the musculoskeletal system and connective tissue: Secondary | ICD-10-CM | POA: Insufficient documentation

## 2012-06-20 DIAGNOSIS — E785 Hyperlipidemia, unspecified: Secondary | ICD-10-CM | POA: Insufficient documentation

## 2012-06-20 DIAGNOSIS — Z7982 Long term (current) use of aspirin: Secondary | ICD-10-CM | POA: Insufficient documentation

## 2012-06-20 DIAGNOSIS — F3289 Other specified depressive episodes: Secondary | ICD-10-CM | POA: Insufficient documentation

## 2012-06-20 DIAGNOSIS — I10 Essential (primary) hypertension: Secondary | ICD-10-CM | POA: Insufficient documentation

## 2012-06-20 DIAGNOSIS — R21 Rash and other nonspecific skin eruption: Secondary | ICD-10-CM | POA: Insufficient documentation

## 2012-06-20 DIAGNOSIS — R0602 Shortness of breath: Secondary | ICD-10-CM | POA: Insufficient documentation

## 2012-06-20 DIAGNOSIS — R011 Cardiac murmur, unspecified: Secondary | ICD-10-CM | POA: Insufficient documentation

## 2012-06-20 DIAGNOSIS — J449 Chronic obstructive pulmonary disease, unspecified: Secondary | ICD-10-CM | POA: Insufficient documentation

## 2012-06-20 DIAGNOSIS — J4489 Other specified chronic obstructive pulmonary disease: Secondary | ICD-10-CM | POA: Insufficient documentation

## 2012-06-20 DIAGNOSIS — F411 Generalized anxiety disorder: Secondary | ICD-10-CM | POA: Insufficient documentation

## 2012-06-20 DIAGNOSIS — F17200 Nicotine dependence, unspecified, uncomplicated: Secondary | ICD-10-CM | POA: Insufficient documentation

## 2012-06-20 DIAGNOSIS — G4733 Obstructive sleep apnea (adult) (pediatric): Secondary | ICD-10-CM | POA: Insufficient documentation

## 2012-06-20 DIAGNOSIS — Z8619 Personal history of other infectious and parasitic diseases: Secondary | ICD-10-CM | POA: Insufficient documentation

## 2012-06-20 DIAGNOSIS — M79609 Pain in unspecified limb: Secondary | ICD-10-CM | POA: Insufficient documentation

## 2012-06-20 DIAGNOSIS — Z8719 Personal history of other diseases of the digestive system: Secondary | ICD-10-CM | POA: Insufficient documentation

## 2012-06-20 DIAGNOSIS — Z9981 Dependence on supplemental oxygen: Secondary | ICD-10-CM | POA: Insufficient documentation

## 2012-06-20 DIAGNOSIS — Z780 Asymptomatic menopausal state: Secondary | ICD-10-CM | POA: Insufficient documentation

## 2012-06-20 DIAGNOSIS — R079 Chest pain, unspecified: Secondary | ICD-10-CM

## 2012-06-20 DIAGNOSIS — Z79899 Other long term (current) drug therapy: Secondary | ICD-10-CM | POA: Insufficient documentation

## 2012-06-20 DIAGNOSIS — E0500 Thyrotoxicosis with diffuse goiter without thyrotoxic crisis or storm: Secondary | ICD-10-CM | POA: Insufficient documentation

## 2012-06-20 DIAGNOSIS — M79604 Pain in right leg: Secondary | ICD-10-CM

## 2012-06-20 DIAGNOSIS — E119 Type 2 diabetes mellitus without complications: Secondary | ICD-10-CM | POA: Insufficient documentation

## 2012-06-20 DIAGNOSIS — F329 Major depressive disorder, single episode, unspecified: Secondary | ICD-10-CM | POA: Insufficient documentation

## 2012-06-20 DIAGNOSIS — Z8669 Personal history of other diseases of the nervous system and sense organs: Secondary | ICD-10-CM | POA: Insufficient documentation

## 2012-06-20 LAB — BASIC METABOLIC PANEL
BUN: 4 mg/dL — ABNORMAL LOW (ref 6–23)
CO2: 28 mEq/L (ref 19–32)
Calcium: 8.9 mg/dL (ref 8.4–10.5)
Chloride: 97 mEq/L (ref 96–112)
Creatinine, Ser: 0.91 mg/dL (ref 0.50–1.10)
GFR calc Af Amer: 81 mL/min — ABNORMAL LOW (ref >90)
GFR calc non Af Amer: 70 mL/min — ABNORMAL LOW (ref >90)
Glucose, Bld: 135 mg/dL — ABNORMAL HIGH (ref 70–99)
Potassium: 3.5 mEq/L (ref 3.5–5.1)
Sodium: 137 mEq/L (ref 135–145)

## 2012-06-20 LAB — CBC
HCT: 34.9 % — ABNORMAL LOW (ref 36.0–46.0)
Hemoglobin: 12 g/dL (ref 12.0–15.0)
MCH: 29.2 pg (ref 26.0–34.0)
MCHC: 34.4 g/dL (ref 30.0–36.0)
MCV: 84.9 fL (ref 78.0–100.0)
Platelets: 254 10*3/uL (ref 150–400)
RBC: 4.11 MIL/uL (ref 3.87–5.11)
RDW: 13.6 % (ref 11.5–15.5)
WBC: 10.7 10*3/uL — ABNORMAL HIGH (ref 4.0–10.5)

## 2012-06-20 LAB — TROPONIN I: Troponin I: <0.3 ng/mL (ref <0.30)

## 2012-06-20 MED ORDER — HYDROMORPHONE HCL PF 1 MG/ML IJ SOLN
1.0000 mg | Freq: Once | INTRAMUSCULAR | Status: AC
  Administered 2012-06-20: 1 mg via INTRAMUSCULAR
  Filled 2012-06-20: qty 1

## 2012-06-20 MED ORDER — ALBUTEROL SULFATE HFA 108 (90 BASE) MCG/ACT IN AERS: 2.0000 | INHALATION_SPRAY | Freq: Once | RESPIRATORY_TRACT | Status: DC

## 2012-06-20 NOTE — ED Notes (Signed)
Patient reports that she was on the CPAP this AM and when she woke this AM she was choking and having central CP. Patient reports that she has COPD and was trying to get to the nebulizer and couldn't so she called 911. Patient also c/o right hip and right leg pain as well. Patient states she has a hisotry of shingles and has a rash on her abdomen and back.

## 2012-06-20 NOTE — ED Notes (Signed)
Pt states she is here for a knot on her R thigh/knee area, pt states it's a 10/10 throbbing pain, been there since March 9th, pt also states she has shingles on her abdomen and sacrum area, couple scratches noted, pt states "I dunno where the scratches come from but I put my lidocaine patch on and it's going away".

## 2012-06-20 NOTE — ED Notes (Signed)
Pt also has productive cough- yellow sputum

## 2012-06-21 NOTE — Progress Notes (Unsigned)
06/21/2012(1000):The pati

## 2012-06-21 NOTE — ED Provider Notes (Signed)
History     CSN: [id number]  Arrival date & time 05/11/12  1355   None     Chief Complaint  Patient presents with  . Joint Pain    (Consider location/radiation/quality/duration/timing/severity/associated sxs/prior treatment) HPI  Vanessa [name] is a 57 y/o [place] h/o RA who presents for c/o joint pain. Patient c/o increased pain and being out pain medication.  Patient was scheduled for f/u appointment today, however the office was closed due to weather event. The patient took neurontin and soma without relief of her symptoms.  She denies any hot, red, or swollen joints.  She c/o severe generalized aching.  Denies fevers, chills, myalgias. Denies DOE, SOB, chest tightness or pressure, radiation to left arm, jaw or back, or diaphoresis. Denies dysuria, flank pain, suprapubic pain, frequency, urgency, or hematuria. Denies headaches, light headedness, weakness, visual disturbances. Denies abdominal pain, nausea, vomiting, diarrhea or constipation.    Past Medical History  Diagnosis Date  . Hypertension   . Heart murmur   . Asthma   . Emphysema   . Diabetes mellitus   . Hyperlipidemia   . Allergic rhinitis   . Chronic headache   . OSA (obstructive sleep apnea)   . Neuropathy   . Grave's disease   . Herniated disc   . COPD (chronic obstructive pulmonary disease)   . Sleep apnea   . Menopause   . Hernia, umbilical   . Anxiety   . Depression   . Back pain, chronic   . Shingles   . Herniated disc   . Arthritis     rheumatoid  . 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 Generalized anxiety disorder 05/26/2012  . Chronic back pain 05/27/2012    Secondary to L4-5 herniated disc per patient    Past Surgical History  Procedure Laterality Date  . Cholecystectomy    . Tubal ligation    . Laparoscopy    . Foot surgery    . Eye surgery      Family History  Problem Relation Age of Onset  . Emphysema Mother   .  Allergies Mother   . Allergies Daughter   . Asthma Mother   . Asthma Brother   . Heart disease Mother   . Rheum arthritis Mother   . Breast cancer Other     aunt  . Lung cancer Brother   . Throat cancer Brother     History  Substance Use Topics  . Smoking status: Current Every Day Smoker -- 1.00 packs/day for 40 years    Types: Cigarettes  . Smokeless tobacco: Never Used  . Alcohol Use: No    OB History   Grav Para Term Preterm Abortions TAB SAB Ect Mult Living   5 3   2  2   3       Review of Systems  Constitutional: Negative for fever and chills.  HENT: Negative for trouble swallowing.   Respiratory: Negative for shortness of breath.   Cardiovascular: Negative for chest pain.  Gastrointestinal: Negative for nausea, vomiting, abdominal pain, diarrhea and constipation.  Genitourinary: Negative for dysuria and hematuria.  Musculoskeletal: Positive for arthralgias. Negative for myalgias.  Skin: Negative for rash.  Neurological: Negative for numbness.  All other systems reviewed and are negative.    Allergies  Ciprofloxacin and Penicillins  Home Medications   Current Outpatient Rx  Name  Route  Sig  Dispense  Refill  . acyclovir (ZOVIRAX) 400  MG tablet   Oral   Take 800 mg by mouth every morning.          Marland Kitchen albuterol (PROAIR HFA) 108 (90 BASE) MCG/ACT inhaler   Inhalation   Inhale 2 puffs into the lungs every 6 (six) hours as needed. For COPD         . beclomethasone (QVAR) 80 MCG/ACT inhaler   Inhalation   Inhale 2 puffs into the lungs every morning.          . carisoprodol (SOMA) 350 MG tablet   Oral   Take 350 mg by mouth every morning.          . clotrimazole-betamethasone (LOTRISONE) cream   Topical   Apply 1 application topically 2 (two) times daily. For face         . FLUoxetine (PROZAC) 20 MG capsule   Oral   Take 60 mg by mouth every morning.          . gabapentin (NEURONTIN) 600 MG tablet   Oral   Take 600 mg by mouth 3  (three) times daily.         Marland Kitchen glipiZIDE (GLUCOTROL XL) 10 MG 24 hr tablet   Oral   Take 10 mg by mouth 2 (two) times daily.         . hydrochlorothiazide (HYDRODIURIL) 25 MG tablet   Oral   Take 25 mg by mouth every morning.          Marland Kitchen ibuprofen (ADVIL,MOTRIN) 200 MG tablet   Oral   Take 800 mg by mouth every 6 (six) hours as needed for pain.         Marland Kitchen levothyroxine (SYNTHROID, LEVOTHROID) 175 MCG tablet   Oral   Take 175 mcg by mouth every morning.          . magnesium hydroxide (MILK OF MAGNESIA) 400 MG/5ML suspension   Oral   Take 30 mLs by mouth daily as needed. For constipation         . metFORMIN (GLUCOPHAGE) 1000 MG tablet   Oral   Take 1,000 mg by mouth 2 (two) times daily.          . mirtazapine (REMERON) 45 MG tablet   Oral   Take 45 mg by mouth at bedtime. For insomnia         . pantoprazole (PROTONIX) 40 MG tablet   Oral   Take 40 mg by mouth daily.         . simvastatin (ZOCOR) 40 MG tablet   Oral   Take 40 mg by mouth at bedtime.          Marland Kitchen aspirin (ASPIRIN EC) 81 MG EC tablet   Oral   Take 1 tablet (81 mg total) by mouth daily. Swallow whole.   30 tablet   12   . busPIRone (BUSPAR) 10 MG tablet   Oral   Take 1 tablet (10 mg total) by mouth 3 (three) times daily.         Marland Kitchen HYDROcodone-acetaminophen (NORCO) 10-325 MG per tablet   Oral   Take 1 tablet by mouth every 4 (four) hours as needed for pain.   20 tablet   0   . ipratropium-albuterol (DUONEB) 0.5-2.5 (3) MG/3ML SOLN   Nebulization   Take 3 mLs by nebulization 4 (four) times daily - after meals and at bedtime. For COPD         . levETIRAcetam (KEPPRA) 500 MG tablet   Oral  Take 1 tablet (500 mg total) by mouth 2 (two) times daily.   60 tablet   3   . lidocaine (LIDODERM) 5 %   Transdermal   Place 1 patch onto the skin daily as needed. Remove & Discard patch within 12 hours or as directed by MD         . nicotine (NICODERM CQ - DOSED IN MG/24 HOURS) 21  mg/24hr patch   Transdermal   Place 1 patch onto the skin daily.   28 patch   3   . verapamil (CALAN) 40 MG tablet   Oral   Take 1 tablet (40 mg total) by mouth 3 (three) times daily.   90 tablet   3     BP 116/82  Pulse 77  Temp(Src) 98.3 F (36.8 C) (Oral)  Resp 16  SpO2 100%  Physical Exam  Constitutional: She is oriented to person, place, and time. She appears well-developed and well-nourished. No distress.  Appears uncomfortable and older than stated age.  HENT:  Head: Normocephalic and atraumatic.  Eyes: Conjunctivae are normal. No scleral icterus.  Neck: Normal range of motion.  Cardiovascular: Normal rate, regular rhythm and normal heart sounds.  Exam reveals no gallop and no friction rub.   No murmur heard. Pulmonary/Chest: Effort normal and breath sounds normal. No respiratory distress.  Abdominal: Soft. Bowel sounds are normal. She exhibits no distension and no mass. There is no tenderness. There is no guarding.  Musculoskeletal: She exhibits tenderness.  No erythema or swelling.  generalzed TTP.  Neurological: She is alert and oriented to person, place, and time.  Skin: Skin is warm and dry. She is not diaphoretic.    ED Course  Procedures (including critical care time)  Labs Reviewed - No data to display Dg Chest 2 View  06/20/2012  *RADIOLOGY REPORT*  Clinical Data: Shortness of breath  CHEST - 2 VIEW  Comparison: 05/26/2012  Findings: The heart and pulmonary vascularity are within normal limits.  The lungs are clear bilaterally.  No focal infiltrate or sizable effusion is seen.  The osseous structures are within normal limits.  IMPRESSION: No acute abnormality noted.   Original Report Authenticated By: Alcide Clever, M.D.      1. Back pain   2. Joint pain       MDM  BP 116/82  Pulse 77  Temp(Src) 98.3 F (36.8 C) (Oral)  Resp 16  SpO2 100% Patient given short course of pain medication as she will not be able to obtain refills for some time.  I  have warned the patient that this might be a volation of her pain contract and that she should check with her PCP before filing the script. The patient appears reasonably screened and/or stabilized for discharge and I doubt any other medical condition or other [facility] requiring further screening, evaluation, or treatment in the ED at this time prior to discharge.         Arthor Captain, PA-C 06/21/12 1617

## 2012-06-21 NOTE — ED Provider Notes (Signed)
History     57 year old female with multiple complaints. Patient is primarily complaining of a small painful mass in the right anterior thigh. She first noticed this approximately 2 weeks ago. She feels as if there is increasing in size. She reports that this was recently evaluated by PCP and they're planning to observe at this time. She is also complaining of some chest pain which has since resolved. She first woke up this morning just for breath when her CPAP mask broke. She is having some chest pain at this time. Substernally. It did not radiate.  She reports that her chest with shortness of breath hasn't resolved. No fevers or chills. No unusual cough. No unusual pain or swelling.  CSN: [id number]  Arrival date & time 06/20/12  1245   First MD Initiated Contact with Patient 06/20/12 1303      Chief Complaint  Patient presents with  . Shortness of Breath  . Rash  . Chest Pain    (Consider location/radiation/quality/duration/timing/severity/associated sxs/prior treatment) HPI  Past Medical History  Diagnosis Date  . Hypertension   . Heart murmur   . Asthma   . Emphysema   . Diabetes mellitus   . Hyperlipidemia   . Allergic rhinitis   . Chronic headache   . OSA (obstructive sleep apnea)   . Neuropathy   . Grave's disease   . Herniated disc   . COPD (chronic obstructive pulmonary disease)   . Sleep apnea   . Menopause   . Hernia, umbilical   . Anxiety   . Depression   . Back pain, chronic   . Shingles   . Herniated disc   . Arthritis     rheumatoid  . 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 Generalized anxiety disorder 05/26/2012  . Chronic back pain 05/27/2012    Secondary to L4-5 herniated disc per patient    Past Surgical History  Procedure Laterality Date  . Cholecystectomy    . Tubal ligation    . Laparoscopy    . Foot surgery    . Eye surgery      Family History  Problem Relation Age of  Onset  . Emphysema Mother   . Allergies Mother   . Allergies Daughter   . Asthma Mother   . Asthma Brother   . Heart disease Mother   . Rheum arthritis Mother   . Breast cancer Other     aunt  . Lung cancer Brother   . Throat cancer Brother     History  Substance Use Topics  . Smoking status: Current Every Day Smoker -- 1.00 packs/day for 40 years    Types: Cigarettes  . Smokeless tobacco: Never Used  . Alcohol Use: No    OB History   Grav Para Term Preterm Abortions TAB SAB Ect Mult Living   5 3   2  2   3       Review of Systems  All systems reviewed and negative, other than as noted in HPI.   Allergies  Ciprofloxacin and Penicillins  Home Medications   Current Outpatient Rx  Name  Route  Sig  Dispense  Refill  . acyclovir (ZOVIRAX) 400 MG tablet   Oral   Take 800 mg by mouth every morning.          Marland Kitchen albuterol (PROAIR HFA) 108 (90 BASE) MCG/ACT inhaler   Inhalation   Inhale 2 puffs into the lungs  every 6 (six) hours as needed. For COPD         . aspirin (ASPIRIN EC) 81 MG EC tablet   Oral   Take 1 tablet (81 mg total) by mouth daily. Swallow whole.   30 tablet   12   . busPIRone (BUSPAR) 10 MG tablet   Oral   Take 1 tablet (10 mg total) by mouth 3 (three) times daily.         . carisoprodol (SOMA) 350 MG tablet   Oral   Take 350 mg by mouth every morning.          . clotrimazole-betamethasone (LOTRISONE) cream   Topical   Apply 1 application topically 2 (two) times daily. For face         . beclomethasone (QVAR) 80 MCG/ACT inhaler   Inhalation   Inhale 2 puffs into the lungs every morning.          Marland Kitchen FLUoxetine (PROZAC) 20 MG capsule   Oral   Take 60 mg by mouth every morning.          . gabapentin (NEURONTIN) 600 MG tablet   Oral   Take 600 mg by mouth 3 (three) times daily.         Marland Kitchen glipiZIDE (GLUCOTROL XL) 10 MG 24 hr tablet   Oral   Take 10 mg by mouth 2 (two) times daily.         . hydrochlorothiazide  (HYDRODIURIL) 25 MG tablet   Oral   Take 25 mg by mouth every morning.          Marland Kitchen HYDROcodone-acetaminophen (NORCO) 10-325 MG per tablet   Oral   Take 1 tablet by mouth every 4 (four) hours as needed for pain.   20 tablet   0   . ibuprofen (ADVIL,MOTRIN) 200 MG tablet   Oral   Take 800 mg by mouth every 6 (six) hours as needed for pain.         Marland Kitchen ipratropium-albuterol (DUONEB) 0.5-2.5 (3) MG/3ML SOLN   Nebulization   Take 3 mLs by nebulization 4 (four) times daily - after meals and at bedtime. For COPD         . levETIRAcetam (KEPPRA) 500 MG tablet   Oral   Take 1 tablet (500 mg total) by mouth 2 (two) times daily.   60 tablet   3   . levothyroxine (SYNTHROID, LEVOTHROID) 175 MCG tablet   Oral   Take 175 mcg by mouth every morning.          . lidocaine (LIDODERM) 5 %   Transdermal   Place 1 patch onto the skin daily as needed. Remove & Discard patch within 12 hours or as directed by MD         . magnesium hydroxide (MILK OF MAGNESIA) 400 MG/5ML suspension   Oral   Take 30 mLs by mouth daily as needed. For constipation         . metFORMIN (GLUCOPHAGE) 1000 MG tablet   Oral   Take 1,000 mg by mouth 2 (two) times daily.          . mirtazapine (REMERON) 45 MG tablet   Oral   Take 45 mg by mouth at bedtime. For insomnia         . nicotine (NICODERM CQ - DOSED IN MG/24 HOURS) 21 mg/24hr patch   Transdermal   Place 1 patch onto the skin daily.   28 patch   3   .  pantoprazole (PROTONIX) 40 MG tablet   Oral   Take 40 mg by mouth daily.         . simvastatin (ZOCOR) 40 MG tablet   Oral   Take 40 mg by mouth at bedtime.          . verapamil (CALAN) 40 MG tablet   Oral   Take 1 tablet (40 mg total) by mouth 3 (three) times daily.   90 tablet   3     BP 115/83  Pulse 66  Temp(Src) 98.3 F (36.8 C) (Oral)  Resp 20  SpO2 97%  Physical Exam  Nursing note and vitals reviewed. Constitutional: She appears well-developed and well-nourished.  No distress.  HENT:  Head: Normocephalic and atraumatic.  Eyes: Conjunctivae are normal. Right eye exhibits no discharge. Left eye exhibits no discharge.  Neck: Neck supple.  Cardiovascular: Normal rate, regular rhythm and normal heart sounds.  Exam reveals no gallop and no friction rub.   No murmur heard. Pulmonary/Chest: Effort normal and breath sounds normal. No respiratory distress.  Abdominal: Soft. She exhibits no distension. There is no tenderness.  Musculoskeletal: She exhibits no edema and no tenderness.  Lower extremities symmetric as compared to each other. No calf tenderness. Negative Homan's. No palpable cords. Pea sized firm mobile mass palpated subcutaneously R distal anterior thigh. No concerning overlying skin changes.    Neurological: She is alert.  Skin: Skin is warm and dry.  Psychiatric: She has a normal mood and affect. Her behavior is normal. Thought content normal.    ED Course  Procedures (including critical care time)  Labs Reviewed  CBC - Abnormal; Notable for the following:    WBC 10.7 (*)    HCT 34.9 (*)    All other components within normal limits  BASIC METABOLIC PANEL - Abnormal; Notable for the following:    Glucose, Bld 135 (*)    BUN 4 (*)    GFR calc non Af Amer 70 (*)    GFR calc Af Amer 81 (*)    All other components within normal limits  TROPONIN I   Dg Chest 2 View  06/20/2012  *RADIOLOGY REPORT*  Clinical Data: Shortness of breath  CHEST - 2 VIEW  Comparison: 05/26/2012  Findings: The heart and pulmonary vascularity are within normal limits.  The lungs are clear bilaterally.  No focal infiltrate or sizable effusion is seen.  The osseous structures are within normal limits.  IMPRESSION: No acute abnormality noted.   Original Report Authenticated By: Alcide Clever, M.D.    EKG:  Rhythm: normal sinus Vent. rate 89 BPM PR interval 152 ms QRS duration 86 ms QT/QTc 364/443 ms ST segments: NS ST changes   1. Leg pain, right   2. Chest pain        MDM  57yF with multiple complaints, none of which appear to be emergent. Mass to R anterior thigh may be lipoma. No concerning findings for vascular or infectious etiology. Sounds like she woke up gasping for breath after CPAP mask broke. No respiratory distress through out ED stay. CP atypical for ACS. Script for new CPAP mask provided. Outpt FU.         Raeford Razor, MD 06/21/12 [phone]

## 2012-06-21 NOTE — ED Provider Notes (Signed)
Medical screening examination/treatment/procedure(s) were performed by non-physician practitioner and as supervising physician I was immediately available for consultation/collaboration.   Carleene Cooper III, MD 06/21/12 [phone]

## 2012-06-26 ENCOUNTER — Encounter (HOSPITAL_COMMUNITY): Payer: Self-pay | Admitting: *Deleted

## 2012-06-26 ENCOUNTER — Emergency Department (HOSPITAL_COMMUNITY): Payer: Medicaid Other

## 2012-06-26 ENCOUNTER — Inpatient Hospital Stay (HOSPITAL_COMMUNITY)
Admission: EM | Admit: 2012-06-26 | Discharge: 2012-06-27 | DRG: 645 | Disposition: A | Discharge status: Home / Self Care | Payer: Medicaid Other | Attending: Internal Medicine | Admitting: Internal Medicine

## 2012-06-26 DIAGNOSIS — I10 Essential (primary) hypertension: Secondary | ICD-10-CM | POA: Diagnosis present

## 2012-06-26 DIAGNOSIS — G4733 Obstructive sleep apnea (adult) (pediatric): Secondary | ICD-10-CM | POA: Diagnosis present

## 2012-06-26 DIAGNOSIS — E1142 Type 2 diabetes mellitus with diabetic polyneuropathy: Secondary | ICD-10-CM | POA: Diagnosis present

## 2012-06-26 DIAGNOSIS — Z9089 Acquired absence of other organs: Secondary | ICD-10-CM

## 2012-06-26 DIAGNOSIS — R0603 Acute respiratory distress: Secondary | ICD-10-CM

## 2012-06-26 DIAGNOSIS — R197 Diarrhea, unspecified: Secondary | ICD-10-CM

## 2012-06-26 DIAGNOSIS — R109 Unspecified abdominal pain: Secondary | ICD-10-CM

## 2012-06-26 DIAGNOSIS — D72829 Elevated white blood cell count, unspecified: Secondary | ICD-10-CM

## 2012-06-26 DIAGNOSIS — E1169 Type 2 diabetes mellitus with other specified complication: Secondary | ICD-10-CM | POA: Diagnosis present

## 2012-06-26 DIAGNOSIS — E872 Acidosis: Secondary | ICD-10-CM

## 2012-06-26 DIAGNOSIS — G8929 Other chronic pain: Secondary | ICD-10-CM

## 2012-06-26 DIAGNOSIS — J4489 Other specified chronic obstructive pulmonary disease: Secondary | ICD-10-CM | POA: Diagnosis present

## 2012-06-26 DIAGNOSIS — E1149 Type 2 diabetes mellitus with other diabetic neurological complication: Secondary | ICD-10-CM | POA: Diagnosis present

## 2012-06-26 DIAGNOSIS — I472 Ventricular tachycardia: Secondary | ICD-10-CM

## 2012-06-26 DIAGNOSIS — Z825 Family history of asthma and other chronic lower respiratory diseases: Secondary | ICD-10-CM

## 2012-06-26 DIAGNOSIS — F411 Generalized anxiety disorder: Secondary | ICD-10-CM | POA: Diagnosis present

## 2012-06-26 DIAGNOSIS — E119 Type 2 diabetes mellitus without complications: Secondary | ICD-10-CM

## 2012-06-26 DIAGNOSIS — E162 Hypoglycemia, unspecified: Secondary | ICD-10-CM

## 2012-06-26 DIAGNOSIS — F329 Major depressive disorder, single episode, unspecified: Secondary | ICD-10-CM | POA: Diagnosis present

## 2012-06-26 DIAGNOSIS — Z880 Allergy status to penicillin: Secondary | ICD-10-CM

## 2012-06-26 DIAGNOSIS — E0500 Thyrotoxicosis with diffuse goiter without thyrotoxic crisis or storm: Secondary | ICD-10-CM

## 2012-06-26 DIAGNOSIS — G9340 Encephalopathy, unspecified: Secondary | ICD-10-CM

## 2012-06-26 DIAGNOSIS — H0520 Unspecified exophthalmos: Secondary | ICD-10-CM | POA: Diagnosis present

## 2012-06-26 DIAGNOSIS — M549 Dorsalgia, unspecified: Secondary | ICD-10-CM

## 2012-06-26 DIAGNOSIS — R55 Syncope and collapse: Secondary | ICD-10-CM

## 2012-06-26 DIAGNOSIS — F3289 Other specified depressive episodes: Secondary | ICD-10-CM | POA: Diagnosis present

## 2012-06-26 DIAGNOSIS — E039 Hypothyroidism, unspecified: Secondary | ICD-10-CM

## 2012-06-26 DIAGNOSIS — J449 Chronic obstructive pulmonary disease, unspecified: Secondary | ICD-10-CM | POA: Diagnosis present

## 2012-06-26 LAB — POCT I-STAT, CHEM 8
Calcium, Ion: 1.14 mmol/L (ref 1.12–1.23)
Glucose, Bld: 115 mg/dL — ABNORMAL HIGH (ref 70–99)
HCT: 39 % (ref 36.0–46.0)
Hemoglobin: 13.3 g/dL (ref 12.0–15.0)
Potassium: 3.1 mEq/L — ABNORMAL LOW (ref 3.5–5.1)
TCO2: 30 mmol/L (ref 0–100)

## 2012-06-26 LAB — GLUCOSE, CAPILLARY
Glucose-Capillary: 113 mg/dL — ABNORMAL HIGH (ref 70–99)
Glucose-Capillary: 38 mg/dL — CL (ref 70–99)
Glucose-Capillary: 66 mg/dL — ABNORMAL LOW (ref 70–99)

## 2012-06-26 LAB — ETHANOL: Alcohol, Ethyl (B): <11 mg/dL (ref 0–11)

## 2012-06-26 LAB — ACETAMINOPHEN LEVEL: Acetaminophen (Tylenol), Serum: <15 ug/mL (ref 10–30)

## 2012-06-26 LAB — CBC WITH DIFFERENTIAL/PLATELET
Eosinophils Absolute: 0.3 10*3/uL (ref 0.0–0.7)
Eosinophils Relative: 2 % (ref 0–5)
Lymphs Abs: 2.3 10*3/uL (ref 0.7–4.0)
MCH: 29.2 pg (ref 26.0–34.0)
MCV: 84.7 fL (ref 78.0–100.0)
Platelets: 234 10*3/uL (ref 150–400)
RDW: 13.8 % (ref 11.5–15.5)

## 2012-06-26 LAB — URINALYSIS, ROUTINE W REFLEX MICROSCOPIC
Hgb urine dipstick: NEGATIVE
Nitrite: NEGATIVE
Specific Gravity, Urine: 1.011 (ref 1.005–1.030)
Urobilinogen, UA: 0.2 mg/dL (ref 0.0–1.0)

## 2012-06-26 LAB — RAPID URINE DRUG SCREEN, HOSP PERFORMED
Opiates: NOT DETECTED
Tetrahydrocannabinol: NOT DETECTED

## 2012-06-26 MED ORDER — DEXTROSE 50 % IV SOLN: 1.0000 | Freq: Once | INTRAVENOUS | Status: AC

## 2012-06-26 MED ORDER — DEXTROSE 5 % IV SOLN: Freq: Once | INTRAVENOUS | Status: DC

## 2012-06-26 MED ORDER — DEXTROSE 50 % IV SOLN
INTRAVENOUS | Status: AC
  Administered 2012-06-26: 50 mL via INTRAVENOUS
  Filled 2012-06-26: qty 50

## 2012-06-26 MED ORDER — DEXTROSE 50 % IV SOLN
1.0000 | Freq: Once | INTRAVENOUS | Status: AC
  Administered 2012-06-26: 50 mL via INTRAVENOUS

## 2012-06-26 MED ORDER — DEXTROSE 5 % IV SOLN
Freq: Once | INTRAVENOUS | Status: AC
  Administered 2012-06-26: 1000 mL via INTRAVENOUS

## 2012-06-26 MED ORDER — DEXTROSE 50 % IV SOLN: 1.0000 | Freq: Once | INTRAVENOUS | Status: DC

## 2012-06-26 MED ORDER — DEXTROSE 50 % IV SOLN
INTRAVENOUS | Status: AC
  Filled 2012-06-26: qty 50

## 2012-06-26 MED ORDER — DEXTROSE 10 % IV SOLN
INTRAVENOUS | Status: DC
  Administered 2012-06-27: via INTRAVENOUS
  Filled 2012-06-26: qty 1000

## 2012-06-26 NOTE — ED Notes (Signed)
EMS called to home.  Found patient laying in bed responsive to verbal. Patient initial blood sugar was 33 by EMS.  EMS gave D10.  2nd blood sugar Was 76.  Patient is more alert and oriented but continues to be lethargic.

## 2012-06-26 NOTE — ED Provider Notes (Signed)
History     CSN: [id number]  Arrival date & time 06/26/12  2003   First MD Initiated Contact with Patient 06/26/12 2024      Chief Complaint  Patient presents with  . Hypoglycemia   level V caveat applies secondary to altered mental status   HPI  History provided by patient and EMS. Patient is a 57 year old female with history of diabetes, anxiety and depression who presents by EMS with hypoglycemia. Patient was found to have blood sugar of 33 by EMS upon arriving to her home. She was very lethargic and poor historian. EMS gave D10 and blood sugar rose to 76. Patient had only slight improvements. She has had normal vital signs and maintaining O2 sats. She was responding to some verbal commands. Patient is a poor historian. She states that she took her metformin normally and usually takes glipizide at night but has not taken this yet. She states she has not been eating well today. Denies any drug or alcohol use. She has no other complaints except wishes to use the bathroom. Denies headache or chest pain. Denies shortness of breath.    Past Medical History  Diagnosis Date  . Hypertension   . Heart murmur   . Asthma   . Emphysema   . Diabetes mellitus   . Hyperlipidemia   . Allergic rhinitis   . Chronic headache   . OSA (obstructive sleep apnea)   . Neuropathy   . Grave's disease   . Herniated disc   . COPD (chronic obstructive pulmonary disease)   . Sleep apnea   . Menopause   . Hernia, umbilical   . Anxiety   . Depression   . Back pain, chronic   . Shingles   . Herniated disc   . Arthritis     rheumatoid  . 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 Generalized anxiety disorder 05/26/2012  . Chronic back pain 05/27/2012    Secondary to L4-5 herniated disc per patient    Past Surgical History  Procedure Laterality Date  . Cholecystectomy    . Tubal ligation    . Laparoscopy    . Foot surgery    . Eye  surgery      Family History  Problem Relation Age of Onset  . Emphysema Mother   . Allergies Mother   . Allergies Daughter   . Asthma Mother   . Asthma Brother   . Heart disease Mother   . Rheum arthritis Mother   . Breast cancer Other     aunt  . Lung cancer Brother   . Throat cancer Brother     History  Substance Use Topics  . Smoking status: Current Every Day Smoker -- 1.00 packs/day for 40 years    Types: Cigarettes  . Smokeless tobacco: Never Used  . Alcohol Use: No    OB History   Grav Para Term Preterm Abortions TAB SAB Ect Mult Living   5 3   2  2   3       Review of Systems  Unable to perform ROS: Mental status change    Allergies  Ciprofloxacin and Penicillins  Home Medications   Current Outpatient Rx  Name  Route  Sig  Dispense  Refill  . acyclovir (ZOVIRAX) 400 MG tablet   Oral   Take 800 mg by mouth every morning.          Marland Kitchen albuterol (PROAIR  HFA) 108 (90 BASE) MCG/ACT inhaler   Inhalation   Inhale 2 puffs into the lungs every 6 (six) hours as needed. For COPD         . aspirin (ASPIRIN EC) 81 MG EC tablet   Oral   Take 1 tablet (81 mg total) by mouth daily. Swallow whole.   30 tablet   12   . beclomethasone (QVAR) 80 MCG/ACT inhaler   Inhalation   Inhale 2 puffs into the lungs every morning.          . busPIRone (BUSPAR) 10 MG tablet   Oral   Take 1 tablet (10 mg total) by mouth 3 (three) times daily.         . carisoprodol (SOMA) 350 MG tablet   Oral   Take 350 mg by mouth every morning.          . clotrimazole-betamethasone (LOTRISONE) cream   Topical   Apply 1 application topically 2 (two) times daily. For face         . FLUoxetine (PROZAC) 20 MG capsule   Oral   Take 60 mg by mouth every morning.          . gabapentin (NEURONTIN) 600 MG tablet   Oral   Take 600 mg by mouth 3 (three) times daily.         Marland Kitchen glipiZIDE (GLUCOTROL XL) 10 MG 24 hr tablet   Oral   Take 10 mg by mouth 2 (two) times daily.          . hydrochlorothiazide (HYDRODIURIL) 25 MG tablet   Oral   Take 25 mg by mouth every morning.          Marland Kitchen HYDROcodone-acetaminophen (NORCO) 10-325 MG per tablet   Oral   Take 1 tablet by mouth every 4 (four) hours as needed for pain.   20 tablet   0   . ibuprofen (ADVIL,MOTRIN) 200 MG tablet   Oral   Take 800 mg by mouth every 6 (six) hours as needed for pain.         Marland Kitchen ipratropium-albuterol (DUONEB) 0.5-2.5 (3) MG/3ML SOLN   Nebulization   Take 3 mLs by nebulization 4 (four) times daily - after meals and at bedtime. For COPD         . levETIRAcetam (KEPPRA) 500 MG tablet   Oral   Take 1 tablet (500 mg total) by mouth 2 (two) times daily.   60 tablet   3   . levothyroxine (SYNTHROID, LEVOTHROID) 175 MCG tablet   Oral   Take 175 mcg by mouth every morning.          . lidocaine (LIDODERM) 5 %   Transdermal   Place 1 patch onto the skin daily as needed. Remove & Discard patch within 12 hours or as directed by MD         . magnesium hydroxide (MILK OF MAGNESIA) 400 MG/5ML suspension   Oral   Take 30 mLs by mouth daily as needed. For constipation         . metFORMIN (GLUCOPHAGE) 1000 MG tablet   Oral   Take 1,000 mg by mouth 2 (two) times daily.          . mirtazapine (REMERON) 45 MG tablet   Oral   Take 45 mg by mouth at bedtime. For insomnia         . nicotine (NICODERM CQ - DOSED IN MG/24 HOURS) 21 mg/24hr patch   Transdermal  Place 1 patch onto the skin daily.   28 patch   3   . pantoprazole (PROTONIX) 40 MG tablet   Oral   Take 40 mg by mouth daily.         . simvastatin (ZOCOR) 40 MG tablet   Oral   Take 40 mg by mouth at bedtime.          . verapamil (CALAN) 40 MG tablet   Oral   Take 1 tablet (40 mg total) by mouth 3 (three) times daily.   90 tablet   3     BP 134/74  Pulse 81  Temp(Src) 97.9 F (36.6 C) (Oral)  Resp 18  SpO2 94%  Physical Exam  Nursing note and vitals reviewed. Constitutional: She is oriented  to person, place, and time. She appears well-developed and well-nourished. No distress.  HENT:  Head: Normocephalic and atraumatic.  Eyes: Conjunctivae and EOM are normal. Pupils are equal, round, and reactive to light.  Cardiovascular: Normal rate and regular rhythm.   Pulmonary/Chest: Effort normal and breath sounds normal. No respiratory distress. She has no wheezes. She has no rales.  Abdominal: Soft.  Musculoskeletal: Normal range of motion. She exhibits no edema and no tenderness.  Neurological: She is alert and oriented to person, place, and time. She has normal strength. GCS eye subscore is 3. GCS verbal subscore is 5. GCS motor subscore is 6.  Patient drowsy and falls asleep easily. She opens eyes to voice command. She follows verbal commands normally and moves extremities equally bilaterally with equal strength. She answers questions in small phrases normally.  Skin: Skin is warm and dry. No rash noted.  Psychiatric: She has a normal mood and affect. Her behavior is normal.    ED Course  Procedures   Results for orders placed during the hospital encounter of 06/26/12  GLUCOSE, CAPILLARY      Result Value Range   Glucose-Capillary 52 (*) 70 - 99 mg/dL   Comment 1 Documented in Chart     Comment 2 Notify RN    CBC WITH DIFFERENTIAL      Result Value Range   WBC 17.5 (*) 4.0 - 10.5 K/uL   RBC 4.25  3.87 - 5.11 MIL/uL   Hemoglobin 12.4  12.0 - 15.0 g/dL   HCT 16.1  09.6 - 04.5 %   MCV 84.7  78.0 - 100.0 fL   MCH 29.2  26.0 - 34.0 pg   MCHC 34.4  30.0 - 36.0 g/dL   RDW 40.9  81.1 - 91.4 %   Platelets 234  150 - 400 K/uL   Neutrophils Relative 80 (*) 43 - 77 %   Neutro Abs 14.0 (*) 1.7 - 7.7 K/uL   Lymphocytes Relative 13  12 - 46 %   Lymphs Abs 2.3  0.7 - 4.0 K/uL   Monocytes Relative 5  3 - 12 %   Monocytes Absolute 0.9  0.1 - 1.0 K/uL   Eosinophils Relative 2  0 - 5 %   Eosinophils Absolute 0.3  0.0 - 0.7 K/uL   Basophils Relative 0  0 - 1 %   Basophils Absolute 0.0   0.0 - 0.1 K/uL  URINE RAPID DRUG SCREEN (HOSP PERFORMED)      Result Value Range   Opiates NONE DETECTED  NONE DETECTED   Cocaine NONE DETECTED  NONE DETECTED   Benzodiazepines NONE DETECTED  NONE DETECTED   Amphetamines NONE DETECTED  NONE DETECTED   Tetrahydrocannabinol NONE DETECTED  NONE DETECTED   Barbiturates NONE DETECTED  NONE DETECTED  URINALYSIS, ROUTINE W REFLEX MICROSCOPIC      Result Value Range   Color, Urine YELLOW  YELLOW   APPearance CLEAR  CLEAR   Specific Gravity, Urine 1.011  1.005 - 1.030   pH 6.0  5.0 - 8.0   Glucose, UA NEGATIVE  NEGATIVE mg/dL   Hgb urine dipstick NEGATIVE  NEGATIVE   Bilirubin Urine NEGATIVE  NEGATIVE   Ketones, ur NEGATIVE  NEGATIVE mg/dL   Protein, ur NEGATIVE  NEGATIVE mg/dL   Urobilinogen, UA 0.2  0.0 - 1.0 mg/dL   Nitrite NEGATIVE  NEGATIVE   Leukocytes, UA TRACE (*) NEGATIVE  ETHANOL      Result Value Range   Alcohol, Ethyl (B) <11  0 - 11 mg/dL  ACETAMINOPHEN LEVEL      Result Value Range   Acetaminophen (Tylenol), Serum <15.0  10 - 30 ug/mL  SALICYLATE LEVEL      Result Value Range   Salicylate Lvl <2.0 (*) 2.8 - 20.0 mg/dL  GLUCOSE, CAPILLARY      Result Value Range   Glucose-Capillary 73  70 - 99 mg/dL   Comment 1 Documented in Chart     Comment 2 Notify RN    LACTIC ACID, PLASMA      Result Value Range   Lactic Acid, Venous 1.5  0.5 - 2.2 mmol/L  URINE MICROSCOPIC-ADD ON      Result Value Range   Squamous Epithelial / LPF RARE  RARE  GLUCOSE, CAPILLARY      Result Value Range   Glucose-Capillary 38 (*) 70 - 99 mg/dL   Comment 1 Documented in Chart     Comment 2 Notify RN    GLUCOSE, CAPILLARY      Result Value Range   Glucose-Capillary 113 (*) 70 - 99 mg/dL   Comment 1 Documented in Chart     Comment 2 Notify RN    GLUCOSE, CAPILLARY      Result Value Range   Glucose-Capillary 94  70 - 99 mg/dL   Comment 1 Documented in Chart     Comment 2 Notify RN    POCT I-STAT, CHEM 8      Result Value Range   Sodium  135  135 - 145 mEq/L   Potassium 3.1 (*) 3.5 - 5.1 mEq/L   Chloride 95 (*) 96 - 112 mEq/L   BUN 6  6 - 23 mg/dL   Creatinine, Ser 2.95  0.50 - 1.10 mg/dL   Glucose, Bld 621 (*) 70 - 99 mg/dL   Calcium, Ion 3.08  6.57 - 1.23 mmol/L   TCO2 30  0 - 100 mmol/L   Hemoglobin 13.3  12.0 - 15.0 g/dL   HCT 84.6  96.2 - 95.2 %       Ct Head Wo Contrast  06/26/2012  *RADIOLOGY REPORT*  Clinical Data: The hypoglycemia.  Altered mental status.  CT HEAD WITHOUT CONTRAST  Technique:  Contiguous axial images were obtained from the base of the skull through the vertex without contrast.  Comparison: MRI brain 05/26/2012.  CT head 05/25/2012.  Findings: The ventricles and sulci are symmetrical without significant effacement, displacement, or dilatation. No mass effect or midline shift. No abnormal extra-axial fluid collections. The grey-white matter junction is distinct. Basal cisterns are not effaced. No acute intracranial hemorrhage. No depressed skull fractures.  Visualized paranasal sinuses and mastoid air cells are not opacified.  There appears to be an old fracture  of the right and left medial orbital wall.  Motion artifact limits visualization vertically in the skull base.  No significant change since previous study.  IMPRESSION: No acute intracranial abnormalities.   Original Report Authenticated By: Burman Nieves, M.D.    Dg Chest Portable 1 View  06/26/2012  *RADIOLOGY REPORT*  Clinical Data: Hypoglycemia.  PORTABLE CHEST - 1 VIEW  Comparison: 06/20/2012  Findings: There is a slightly shallow inspiration. The heart size and pulmonary vascularity are normal. The lungs appear clear and expanded without focal air space disease or consolidation. No blunting of the costophrenic angles.  No pneumothorax.  Mediastinal contours appear intact.  Old right rib fractures.  Stable appearance since previous study.  IMPRESSION: No evidence of active pulmonary disease.   Original Report Authenticated By: Burman Nieves, M.D.      1. Hypoglycemia       MDM  8:30 PM patient seen and evaluated. Patient is drowsy and falls sleep easily however opens eyes and responds to verbal commands easily.  Patient continues to have dips in blood sugar despite treatments. Will increase rate of D5.  Patient discussed with attending physician. Will plan on admission for hypoglycemia.  Spoke with [facility] hospitalist. They will admit patient to telemetry bed under team 8.      Angus Seller, PA-C 06/26/12 2303

## 2012-06-26 NOTE — ED Provider Notes (Signed)
Medical screening examination/treatment/procedure(s) were performed by non-physician practitioner and as supervising physician I was immediately available for consultation/collaboration.   Dione Booze, MD 06/26/12 2322

## 2012-06-27 ENCOUNTER — Ambulatory Visit: Payer: Self-pay | Admitting: Neurology

## 2012-06-27 ENCOUNTER — Encounter (HOSPITAL_COMMUNITY): Payer: Self-pay | Admitting: Internal Medicine

## 2012-06-27 DIAGNOSIS — G8929 Other chronic pain: Secondary | ICD-10-CM

## 2012-06-27 DIAGNOSIS — E119 Type 2 diabetes mellitus without complications: Secondary | ICD-10-CM

## 2012-06-27 DIAGNOSIS — E039 Hypothyroidism, unspecified: Secondary | ICD-10-CM

## 2012-06-27 DIAGNOSIS — M549 Dorsalgia, unspecified: Secondary | ICD-10-CM

## 2012-06-27 DIAGNOSIS — D72829 Elevated white blood cell count, unspecified: Secondary | ICD-10-CM | POA: Diagnosis present

## 2012-06-27 LAB — CBC WITH DIFFERENTIAL/PLATELET
Basophils Relative: 0 % (ref 0–1)
Eosinophils Absolute: 0.3 10*3/uL (ref 0.0–0.7)
HCT: 38.7 % (ref 36.0–46.0)
Hemoglobin: 13.1 g/dL (ref 12.0–15.0)
Lymphocytes Relative: 18 % (ref 12–46)
MCHC: 33.9 g/dL (ref 30.0–36.0)
Monocytes Relative: 5 % (ref 3–12)
Neutro Abs: 10.6 10*3/uL — ABNORMAL HIGH (ref 1.7–7.7)

## 2012-06-27 LAB — COMPREHENSIVE METABOLIC PANEL
ALT: 20 U/L (ref 0–35)
AST: 22 U/L (ref 0–37)
Albumin: 3.6 g/dL (ref 3.5–5.2)
Calcium: 9.2 mg/dL (ref 8.4–10.5)
GFR calc Af Amer: 75 mL/min — ABNORMAL LOW (ref >90)
Glucose, Bld: 76 mg/dL (ref 70–99)
Sodium: 134 mEq/L — ABNORMAL LOW (ref 135–145)
Total Protein: 6.7 g/dL (ref 6.0–8.3)

## 2012-06-27 LAB — GLUCOSE, CAPILLARY
Glucose-Capillary: 84 mg/dL (ref 70–99)
Glucose-Capillary: 63 mg/dL — ABNORMAL LOW (ref 70–99)
Glucose-Capillary: 75 mg/dL (ref 70–99)
Glucose-Capillary: 78 mg/dL (ref 70–99)
Glucose-Capillary: 82 mg/dL (ref 70–99)
Glucose-Capillary: 119 mg/dL — ABNORMAL HIGH (ref 70–99)
Glucose-Capillary: 167 mg/dL — ABNORMAL HIGH (ref 70–99)
Glucose-Capillary: 162 mg/dL — ABNORMAL HIGH (ref 70–99)

## 2012-06-27 LAB — TSH: TSH: 12.611 u[IU]/mL — ABNORMAL HIGH (ref 0.350–4.500)

## 2012-06-27 MED ORDER — NICOTINE 21 MG/24HR TD PT24
21.0000 mg | MEDICATED_PATCH | Freq: Every day | TRANSDERMAL | Status: DC
  Administered 2012-06-27: 21 mg via TRANSDERMAL
  Filled 2012-06-27: qty 1

## 2012-06-27 MED ORDER — MUPIROCIN 2 % EX OINT
TOPICAL_OINTMENT | Freq: Two times a day (BID) | CUTANEOUS | Status: DC
  Administered 2012-06-27: 11:00:00 via NASAL
  Filled 2012-06-27: qty 22

## 2012-06-27 MED ORDER — IPRATROPIUM BROMIDE 0.02 % IN SOLN
0.5000 mg | Freq: Three times a day (TID) | RESPIRATORY_TRACT | Status: DC
  Administered 2012-06-27: 0.5 mg via RESPIRATORY_TRACT
  Filled 2012-06-27 (×2): qty 2.5

## 2012-06-27 MED ORDER — MIRTAZAPINE 45 MG PO TABS
45.0000 mg | ORAL_TABLET | Freq: Every day | ORAL | Status: DC
  Administered 2012-06-27: 45 mg via ORAL
  Filled 2012-06-27 (×2): qty 1

## 2012-06-27 MED ORDER — SIMVASTATIN 40 MG PO TABS
40.0000 mg | ORAL_TABLET | Freq: Every day | ORAL | Status: DC
  Administered 2012-06-27: 40 mg via ORAL
  Filled 2012-06-27 (×2): qty 1

## 2012-06-27 MED ORDER — ENOXAPARIN SODIUM 40 MG/0.4ML ~~LOC~~ SOLN
40.0000 mg | SUBCUTANEOUS | Status: DC
  Administered 2012-06-27: 40 mg via SUBCUTANEOUS
  Filled 2012-06-27: qty 0.4

## 2012-06-27 MED ORDER — IPRATROPIUM-ALBUTEROL 0.5-2.5 (3) MG/3ML IN SOLN: 3.0000 mL | Freq: Three times a day (TID) | RESPIRATORY_TRACT | Status: DC

## 2012-06-27 MED ORDER — VANCOMYCIN HCL IN DEXTROSE 1-5 GM/200ML-% IV SOLN
1000.0000 mg | Freq: Two times a day (BID) | INTRAVENOUS | Status: DC
  Administered 2012-06-27: 1000 mg via INTRAVENOUS
  Filled 2012-06-27 (×2): qty 200

## 2012-06-27 MED ORDER — BECLOMETHASONE DIPROPIONATE 80 MCG/ACT IN AERS: 2.0000 | INHALATION_SPRAY | Freq: Every morning | RESPIRATORY_TRACT | Status: DC

## 2012-06-27 MED ORDER — CARISOPRODOL 350 MG PO TABS
350.0000 mg | ORAL_TABLET | Freq: Every morning | ORAL | Status: DC
  Administered 2012-06-27: 350 mg via ORAL
  Filled 2012-06-27: qty 1

## 2012-06-27 MED ORDER — DEXTROSE 50 % IV SOLN
1.0000 | Freq: Once | INTRAVENOUS | Status: AC
  Administered 2012-06-27: 50 mL via INTRAVENOUS

## 2012-06-27 MED ORDER — SODIUM CHLORIDE 0.9 % IJ SOLN
3.0000 mL | Freq: Two times a day (BID) | INTRAMUSCULAR | Status: DC
  Administered 2012-06-27: 3 mL via INTRAVENOUS

## 2012-06-27 MED ORDER — LIDOCAINE 5 % EX PTCH
1.0000 | MEDICATED_PATCH | Freq: Every day | CUTANEOUS | Status: DC | PRN
  Filled 2012-06-27: qty 1

## 2012-06-27 MED ORDER — ALBUTEROL SULFATE HFA 108 (90 BASE) MCG/ACT IN AERS
2.0000 | INHALATION_SPRAY | Freq: Four times a day (QID) | RESPIRATORY_TRACT | Status: DC | PRN
  Administered 2012-06-27: 2 via RESPIRATORY_TRACT
  Filled 2012-06-27: qty 6.7

## 2012-06-27 MED ORDER — MUPIROCIN 2 % EX OINT: TOPICAL_OINTMENT | Freq: Three times a day (TID) | CUTANEOUS | Status: DC

## 2012-06-27 MED ORDER — ACYCLOVIR 400 MG PO TABS
800.0000 mg | ORAL_TABLET | Freq: Every morning | ORAL | Status: DC
  Administered 2012-06-27: 800 mg via ORAL
  Filled 2012-06-27: qty 2

## 2012-06-27 MED ORDER — LEVOTHYROXINE SODIUM 175 MCG PO TABS
175.0000 ug | ORAL_TABLET | Freq: Every day | ORAL | Status: DC
  Administered 2012-06-27: 175 ug via ORAL
  Filled 2012-06-27 (×2): qty 1

## 2012-06-27 MED ORDER — HYDROCODONE-ACETAMINOPHEN 10-325 MG PO TABS
1.0000 | ORAL_TABLET | ORAL | Status: DC | PRN
  Administered 2012-06-27 (×4): 1 via ORAL
  Filled 2012-06-27 (×4): qty 1

## 2012-06-27 MED ORDER — DEXTROSE 50 % IV SOLN
INTRAVENOUS | Status: AC
  Filled 2012-06-27: qty 50

## 2012-06-27 MED ORDER — ONDANSETRON HCL 4 MG PO TABS: 4.0000 mg | ORAL_TABLET | Freq: Four times a day (QID) | ORAL | Status: DC | PRN

## 2012-06-27 MED ORDER — FLUOXETINE HCL 20 MG PO CAPS
60.0000 mg | ORAL_CAPSULE | Freq: Every morning | ORAL | Status: DC
Start: 2012-06-27 — End: 2012-06-27
  Administered 2012-06-27: 60 mg via ORAL
  Filled 2012-06-27: qty 3

## 2012-06-27 MED ORDER — ACETAMINOPHEN 325 MG PO TABS: 650.0000 mg | ORAL_TABLET | Freq: Four times a day (QID) | ORAL | Status: DC | PRN

## 2012-06-27 MED ORDER — CHLORHEXIDINE GLUCONATE 4 % EX LIQD: 1.0000 "application " | Freq: Every day | CUTANEOUS | Status: DC

## 2012-06-27 MED ORDER — ACETAMINOPHEN 650 MG RE SUPP: 650.0000 mg | Freq: Four times a day (QID) | RECTAL | Status: DC | PRN

## 2012-06-27 MED ORDER — ASPIRIN EC 81 MG PO TBEC
81.0000 mg | DELAYED_RELEASE_TABLET | Freq: Every day | ORAL | Status: DC
  Administered 2012-06-27: 81 mg via ORAL
  Filled 2012-06-27: qty 1

## 2012-06-27 MED ORDER — LEVETIRACETAM 500 MG PO TABS
500.0000 mg | ORAL_TABLET | Freq: Two times a day (BID) | ORAL | Status: DC
  Administered 2012-06-27: 500 mg via ORAL
  Filled 2012-06-27 (×2): qty 1

## 2012-06-27 MED ORDER — ALBUTEROL SULFATE (5 MG/ML) 0.5% IN NEBU
2.5000 mg | INHALATION_SOLUTION | Freq: Three times a day (TID) | RESPIRATORY_TRACT | Status: DC
  Administered 2012-06-27: 2.5 mg via RESPIRATORY_TRACT
  Filled 2012-06-27 (×2): qty 0.5

## 2012-06-27 MED ORDER — VANCOMYCIN HCL 10 G IV SOLR
1500.0000 mg | Freq: Once | INTRAVENOUS | Status: AC
  Administered 2012-06-27: 1500 mg via INTRAVENOUS
  Filled 2012-06-27: qty 1500

## 2012-06-27 MED ORDER — DOXYCYCLINE HYCLATE 50 MG PO CAPS: 100.0000 mg | ORAL_CAPSULE | Freq: Two times a day (BID) | ORAL | Status: DC

## 2012-06-27 MED ORDER — CHLORHEXIDINE GLUCONATE 4 % EX LIQD
1.0000 "application " | Freq: Every day | CUTANEOUS | Status: DC
  Administered 2012-06-27: 1 via TOPICAL
  Filled 2012-06-27: qty 15

## 2012-06-27 MED ORDER — GLIPIZIDE 5 MG PO TABS: 5.0000 mg | ORAL_TABLET | Freq: Every day | ORAL | Status: DC

## 2012-06-27 MED ORDER — FLUTICASONE PROPIONATE HFA 44 MCG/ACT IN AERO
1.0000 | INHALATION_SPRAY | Freq: Two times a day (BID) | RESPIRATORY_TRACT | Status: DC
  Administered 2012-06-27: 1 via RESPIRATORY_TRACT
  Filled 2012-06-27: qty 10.6

## 2012-06-27 MED ORDER — ONDANSETRON HCL 4 MG/2ML IJ SOLN: 4.0000 mg | Freq: Four times a day (QID) | INTRAMUSCULAR | Status: DC | PRN

## 2012-06-27 MED ORDER — ATORVASTATIN CALCIUM 20 MG PO TABS
20.0000 mg | ORAL_TABLET | Freq: Every day | ORAL | Status: DC
  Filled 2012-06-27: qty 1

## 2012-06-27 MED ORDER — FREESTYLE SYSTEM KIT: 1.0000 | PACK | Freq: Three times a day (TID) | Status: DC

## 2012-06-27 MED ORDER — PANTOPRAZOLE SODIUM 40 MG PO TBEC
40.0000 mg | DELAYED_RELEASE_TABLET | Freq: Every day | ORAL | Status: DC
  Administered 2012-06-27: 40 mg via ORAL
  Filled 2012-06-27 (×2): qty 1

## 2012-06-27 MED ORDER — VERAPAMIL HCL 40 MG PO TABS
40.0000 mg | ORAL_TABLET | Freq: Three times a day (TID) | ORAL | Status: DC
  Administered 2012-06-27: 40 mg via ORAL
  Filled 2012-06-27 (×3): qty 1

## 2012-06-27 MED ORDER — TRAMADOL HCL 50 MG PO TABS: 50.0000 mg | ORAL_TABLET | Freq: Three times a day (TID) | ORAL | Status: DC | PRN

## 2012-06-27 MED ORDER — GABAPENTIN 300 MG PO CAPS
600.0000 mg | ORAL_CAPSULE | Freq: Three times a day (TID) | ORAL | Status: DC
  Administered 2012-06-27: 600 mg via ORAL
  Filled 2012-06-27 (×3): qty 2

## 2012-06-27 MED ORDER — BUSPIRONE HCL 10 MG PO TABS
10.0000 mg | ORAL_TABLET | Freq: Three times a day (TID) | ORAL | Status: DC
  Administered 2012-06-27: 10 mg via ORAL
  Filled 2012-06-27 (×3): qty 1

## 2012-06-27 NOTE — H&P (Signed)
[facility] History and Physical  [name] EXB:[id number] DOB: 09/07/1955 DOA: 06/26/2012  Referring physician: Mr.Peter PA. PCP: [name], MD  Specialists: Dr.Kline pulmonologist.  Chief Complaint: Hypoglycemia.  HPI: Vanessa [name] is a 57 y.o. female who was recently admitted in the hospital for syncope and at that time patient was found to be hyperglycemic and also has had a run of V. tach and patient had undergone cardiac catheter which showed EF of 60% with normal coronaries and patient was placed on verapamil by the cardiologist and also since patient also had possible seizures plate on Keppra by the neurologist at that time was brought to the ER patient was found to be persistently hypoglycemic at home yesterday. Patient states that she got cold and clammy last evening around 3:00 and she checked her sugar it was around 55. She ate something and recheck it was around 160 and after a few minutes again she became symptomatic and at that point she called EMS and was brought to the ER. Patient's CBG fell as low as 33 in the ER and was given D50 and was started on D5 and eventually has to be started on D. 10. She gradually became more responsive alert and awake. CT head was negative for anything acute. Patient at this time alert awake oriented to time place and person. Moves all extremities. Patient states that cough recently she has been noticing lumps on her scalp, thighs and trunk. Denies any fever chills. The lump on the scalp had given way and discharge some pus few days ago. Patient also states that 3 weeks ago she had lesions suggestive of shingles and was just started on acyclovir 3 days ago. Presently on my exam there is no rash on the back. Patient otherwise denies any chest pain palpitation nausea vomiting diarrhea. Patient denies taking more than her regular dose of diabetic medications and states that she's been eating regularly without any difficulty. Her labs in the  ER shows leukocytosis.   Review of Systems: As presented in the history of presenting illness, rest negative.  Past Medical History  Diagnosis Date  . Hypertension   . Heart murmur   . Asthma   . Emphysema   . Diabetes mellitus   . Hyperlipidemia   . Allergic rhinitis   . Chronic headache   . OSA (obstructive sleep apnea)   . Neuropathy   . Grave's disease   . Herniated disc   . COPD (chronic obstructive pulmonary disease)   . Sleep apnea   . Menopause   . Hernia, umbilical   . Anxiety   . Depression   . Back pain, chronic   . Shingles   . Herniated disc   . Arthritis     rheumatoid  . 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 Generalized anxiety disorder 05/26/2012  . Chronic back pain 05/27/2012    Secondary to L4-5 herniated disc per patient   Past Surgical History  Procedure Laterality Date  . Cholecystectomy    . Tubal ligation    . Laparoscopy    . Foot surgery    . Eye surgery     Social History:  reports that she has been smoking Cigarettes.  She has a 40 pack-year smoking history. She has never used smokeless tobacco. She reports that she does not drink alcohol or use illicit drugs.  lives at home.  where does patient live--  can do ADLs.  Can  patient participate in ADLs?  Allergies  Allergen Reactions  . Ciprofloxacin Hives  . Penicillins Hives    Family History  Problem Relation Age of Onset  . Emphysema Mother   . Allergies Mother   . Allergies Daughter   . Asthma Mother   . Asthma Brother   . Heart disease Mother   . Rheum arthritis Mother   . Breast cancer Other     aunt  . Lung cancer Brother   . Throat cancer Brother       Prior to Admission medications   Medication Sig Start Date End Date Taking? Authorizing Provider  acyclovir (ZOVIRAX) 400 MG tablet Take 800 mg by mouth every morning.     Historical Provider, MD  albuterol (PROAIR HFA) 108 (90 BASE) MCG/ACT inhaler  Inhale 2 puffs into the lungs every 6 (six) hours as needed. For COPD    Historical Provider, MD  aspirin (ASPIRIN EC) 81 MG EC tablet Take 1 tablet (81 mg total) by mouth daily. Swallow whole. 05/31/12   Ripudeep Jenna Luo, MD  beclomethasone (QVAR) 80 MCG/ACT inhaler Inhale 2 puffs into the lungs every morning.     Historical Provider, MD  busPIRone (BUSPAR) 10 MG tablet Take 1 tablet (10 mg total) by mouth 3 (three) times daily. 05/31/12   Ripudeep Jenna Luo, MD  carisoprodol (SOMA) 350 MG tablet Take 350 mg by mouth every morning.     Historical Provider, MD  clotrimazole-betamethasone (LOTRISONE) cream Apply 1 application topically 2 (two) times daily. For face    Historical Provider, MD  FLUoxetine (PROZAC) 20 MG capsule Take 60 mg by mouth every morning.     Historical Provider, MD  gabapentin (NEURONTIN) 600 MG tablet Take 600 mg by mouth 3 (three) times daily.    Historical Provider, MD  glipiZIDE (GLUCOTROL XL) 10 MG 24 hr tablet Take 10 mg by mouth 2 (two) times daily.    Historical Provider, MD  hydrochlorothiazide (HYDRODIURIL) 25 MG tablet Take 25 mg by mouth every morning.     Historical Provider, MD  HYDROcodone-acetaminophen (NORCO) 10-325 MG per tablet Take 1 tablet by mouth every 4 (four) hours as needed for pain. 05/11/12   Arthor Captain, PA-C  ibuprofen (ADVIL,MOTRIN) 200 MG tablet Take 800 mg by mouth every 6 (six) hours as needed for pain.    Historical Provider, MD  ipratropium-albuterol (DUONEB) 0.5-2.5 (3) MG/3ML SOLN Take 3 mLs by nebulization 4 (four) times daily - after meals and at bedtime. For COPD    Historical Provider, MD  levETIRAcetam (KEPPRA) 500 MG tablet Take 1 tablet (500 mg total) by mouth 2 (two) times daily. 05/31/12   Ripudeep Jenna Luo, MD  levothyroxine (SYNTHROID, LEVOTHROID) 175 MCG tablet Take 175 mcg by mouth every morning.     Historical Provider, MD  lidocaine (LIDODERM) 5 % Place 1 patch onto the skin daily as needed. Remove & Discard patch within 12 hours or as  directed by MD    Historical Provider, MD  magnesium hydroxide (MILK OF MAGNESIA) 400 MG/5ML suspension Take 30 mLs by mouth daily as needed. For constipation    Historical Provider, MD  metFORMIN (GLUCOPHAGE) 1000 MG tablet Take 1,000 mg by mouth 2 (two) times daily.     Historical Provider, MD  mirtazapine (REMERON) 45 MG tablet Take 45 mg by mouth at bedtime. For insomnia    Historical Provider, MD  nicotine (NICODERM CQ - DOSED IN MG/24 HOURS) 21 mg/24hr patch Place 1 patch onto the skin  daily. 05/31/12   Ripudeep Jenna Luo, MD  pantoprazole (PROTONIX) 40 MG tablet Take 40 mg by mouth daily.    Historical Provider, MD  simvastatin (ZOCOR) 40 MG tablet Take 40 mg by mouth at bedtime.     Historical Provider, MD  verapamil (CALAN) 40 MG tablet Take 1 tablet (40 mg total) by mouth 3 (three) times daily. 05/31/12   Ripudeep Jenna Luo, MD   Physical Exam: Filed Vitals:   06/26/12 2010 06/26/12 2310 06/26/12 2311  BP: 134/74 138/79 138/79  Pulse: 81 78 76  Temp: 97.9 F (36.6 C)  97.9 F (36.6 C)  TempSrc: Oral  Oral  Resp: 18 17 16   SpO2: 94% 100% 99%     General:  Well developed and nourished.   Eyes: No pallor or icterus.   ENT:  no discharge from the ears eyes nose or mouth. I did not see any active discharge or lesions on the scalp.   Neck: No mass or JVD.   Cardiovascular:  S1-S2 heard.   Respiratory:  No rhonchi or crepitations.  Abdomen:  soft nontender bowel sounds present.   Skin: No rash seen at this time.   Musculoskeletal:  no edema.   Psychiatric:  appears normal. Denies any suicidal ideation.  Neurologic:  alert awake oriented to time place and person. Moves all extremities.   Labs on Admission:  Basic Metabolic Panel:  Recent Labs Lab 06/20/12 1325 06/26/12 2056  NA 137 135  K 3.5 3.1*  CL 97 95*  CO2 28  --   GLUCOSE 135* 115*  BUN 4* 6  CREATININE 0.91 1.00  CALCIUM 8.9  --    Liver Function Tests: No results found for this basename: AST, ALT,  ALKPHOS, BILITOT, PROT, ALBUMIN,  in the last 168 hours No results found for this basename: LIPASE, AMYLASE,  in the last 168 hours No results found for this basename: AMMONIA,  in the last 168 hours CBC:  Recent Labs Lab 06/20/12 1325 06/26/12 2037 06/26/12 2056  WBC 10.7* 17.5*  --   NEUTROABS  --  14.0*  --   HGB 12.0 12.4 13.3  HCT 34.9* 36.0 39.0  MCV 84.9 84.7  --   PLT 254 234  --    Cardiac Enzymes:  Recent Labs Lab 06/20/12 1325  TROPONINI <0.30    BNP (last 3 results) No results found for this basename: PROBNP,  in the last 8760 hours CBG:  Recent Labs Lab 06/26/12 2146 06/26/12 2212 06/26/12 2236 06/26/12 2309 06/26/12 2339  GLUCAP 38* 113* 94 66* 63*    Radiological Exams on Admission: Ct Head Wo Contrast  06/26/2012  *RADIOLOGY REPORT*  Clinical Data: The hypoglycemia.  Altered mental status.  CT HEAD WITHOUT CONTRAST  Technique:  Contiguous axial images were obtained from the base of the skull through the vertex without contrast.  Comparison: MRI brain 05/26/2012.  CT head 05/25/2012.  Findings: The ventricles and sulci are symmetrical without significant effacement, displacement, or dilatation. No mass effect or midline shift. No abnormal extra-axial fluid collections. The grey-white matter junction is distinct. Basal cisterns are not effaced. No acute intracranial hemorrhage. No depressed skull fractures.  Visualized paranasal sinuses and mastoid air cells are not opacified.  There appears to be an old fracture of the right and left medial orbital wall.  Motion artifact limits visualization vertically in the skull base.  No significant change since previous study.  IMPRESSION: No acute intracranial abnormalities.   Original Report Authenticated By: Chrissie Noa  Andria Meuse, M.D.    Dg Chest Portable 1 View  06/26/2012  *RADIOLOGY REPORT*  Clinical Data: Hypoglycemia.  PORTABLE CHEST - 1 VIEW  Comparison: 06/20/2012  Findings: There is a slightly shallow inspiration.  The heart size and pulmonary vascularity are normal. The lungs appear clear and expanded without focal air space disease or consolidation. No blunting of the costophrenic angles.  No pneumothorax.  Mediastinal contours appear intact.  Old right rib fractures.  Stable appearance since previous study.  IMPRESSION: No evidence of active pulmonary disease.   Original Report Authenticated By: Burman Nieves, M.D.     Assessment/Plan Principal Problem:   Hypoglycemia Active Problems:   OSA (obstructive sleep apnea)   Graves' disease with exophthalmos   Leucocytosis   1. Hypoglycemia in a patient with known history of diabetes mellitus type 2 on sulfonylureas and metformin  - patient has been started on D. 10. Check CBG every 2 hours until a.m. If patient remains asymptomatic and CBG consistently remains high then may decrease the frequency of CBG checks. Discontinue sulfonylurea. At discharge may continue metformin. Patient's recent hemoglobin A1c was 6.4.  2. Leukocytosis with possible skin infections  - blood cultures as been ordered. Patient at this time has been placed on vancomycin which can be changed to oral antibiotics if that is no significant worsening of leukocytosis or blood cultures are negative.  3. Recently started on acyclovir for possible shingles  - patient states she had shingles on March 9 and her lesions have completely resolved by last week and patient has been just on acyclovir. I don't see any lesions suggestive of shingles at this time had her back which she states she had few weeks ago. In a.m. if she continues to have no lesions then may discontinue acyclovir.  4. OSA on CPAP - continue CPAP for respiratory at bedtime. 5. History of Graves status post radioactive iodine and ablation on Synthroid - check TSH continue Synthroid. 6. Recently admitted for syncope and was found to have a week tach and seizures - continue present medication and monitor in telemetry continue  Keppra. 7. COPD - presently not wheezing continue present medications. 8. Chronic pain - continue present medications.    Code Status:  full code.  Family Communication:  None.  Disposition Plan:  admit to inpatient.    Kadence Mimbs N. [facility] Pager [phone].   If 7PM-7AM, please contact night-coverage [email or web address] Password [facility] 06/27/2012, 12:14 AM

## 2012-06-27 NOTE — Progress Notes (Signed)
ANTIBIOTIC CONSULT NOTE - INITIAL  Pharmacy Consult for vancomycin Indication: "boils"  Allergies  Allergen Reactions  . Ciprofloxacin Hives  . Penicillins Hives    Patient Measurements: Height: 5\' 9"  (175.3 cm) Weight: 224 lb 6.9 oz (101.8 kg) IBW/kg (Calculated) : 66.2 Adjusted Body Weight:   Vital Signs: Temp: 97.9 F (36.6 C) (03/31 0419) Temp src: Oral (03/31 0419) BP: 120/75 mmHg (03/31 0419) Pulse Rate: 77 (03/31 0419) Intake/Output from previous day: 03/30 0701 - 03/31 0700 In: -  Out: 1700 [Urine:1700] Intake/Output from this shift: Total I/O In: -  Out: 1700 [Urine:1700]  Labs:  Recent Labs  06/26/12 2037 06/26/12 2056 06/27/12 0250  WBC 17.5*  --  14.1*  HGB 12.4 13.3 13.1  PLT 234  --  194  CREATININE  --  1.00 0.97   Estimated Creatinine Clearance: 83.2 ml/min (by C-G formula based on Cr of 0.97). No results found for this basename: VANCOTROUGH, VANCOPEAK, VANCORANDOM, GENTTROUGH, GENTPEAK, GENTRANDOM, TOBRATROUGH, TOBRAPEAK, TOBRARND, AMIKACINPEAK, AMIKACINTROU, AMIKACIN,  in the last 72 hours   Microbiology: No results found for this or any previous visit (from the past 720 hour(s)).  Medical History: Past Medical History  Diagnosis Date  . Hypertension   . Heart murmur   . Asthma   . Emphysema   . Diabetes mellitus   . Hyperlipidemia   . Allergic rhinitis   . Chronic headache   . OSA (obstructive sleep apnea)   . Neuropathy   . Grave's disease   . Herniated disc   . COPD (chronic obstructive pulmonary disease)   . Sleep apnea   . Menopause   . Hernia, umbilical   . Anxiety   . Depression   . Back pain, chronic   . Shingles   . Herniated disc   . Arthritis     rheumatoid  . 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 Generalized anxiety disorder 05/26/2012  . Chronic back pain 05/27/2012    Secondary to L4-5 herniated disc per patient    Medications:   Anti-infectives   Start     Dose/Rate Route Frequency Ordered Stop   06/27/12 1200  vancomycin (VANCOCIN) IVPB 1000 mg/200 mL premix     1,000 mg 200 mL/hr over 60 Minutes Intravenous Every 12 hours 06/27/12 0455     06/27/12 1000  acyclovir (ZOVIRAX) tablet 800 mg     800 mg Oral  Every morning - 10a 06/27/12 0143     06/27/12 0200  vancomycin (VANCOCIN) 1,500 mg in sodium chloride 0.9 % 500 mL IVPB     1,500 mg 250 mL/hr over 120 Minutes Intravenous  Once 06/27/12 0151 06/27/12 0410     Assessment: Patient with "boils".  Vancomycin per pharmacy ordered.  Goal of Therapy:  Vancomycin trough level 15-20 mcg/ml  Plan:  Measure antibiotic drug levels at steady state Follow up culture results Vancomycin 1500mg  x1, then 1gm iv q12hr  Aleene Davidson Crowford 06/27/2012,4:56 AM

## 2012-06-27 NOTE — Discharge Summary (Signed)
[facility]                                                                                   Vanessa [name], is a 57 y.o. female  DOB 01-06-1956  MRN [id number].  Admission date:  06/26/2012  Discharge Date:  06/27/2012  Primary MD  [name], MD  Admitting Physician  Eduard Clos, MD  Admission Diagnosis  Graves' disease with exophthalmos [242.00, 376.21] Leucocytosis [288.60] Hypoglycemia [251.2] OSA (obstructive sleep apnea) [327.23]  Discharge Diagnosis     Principal Problem:   Hypoglycemia Active Problems:   OSA (obstructive sleep apnea)   Graves' disease with exophthalmos   Leucocytosis    Past Medical History  Diagnosis Date  . Hypertension   . Heart murmur   . Asthma   . Emphysema   . Diabetes mellitus   . Hyperlipidemia   . Allergic rhinitis   . Chronic headache   . OSA (obstructive sleep apnea)   . Neuropathy   . Grave's disease   . Herniated disc   . COPD (chronic obstructive pulmonary disease)   . Sleep apnea   . Menopause   . Hernia, umbilical   . Anxiety   . Depression   . Back pain, chronic   . Shingles   . Herniated disc   . Arthritis     rheumatoid  . 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 Generalized anxiety disorder 05/26/2012  . Chronic back pain 05/27/2012    Secondary to L4-5 herniated disc per patient    Past Surgical History  Procedure Laterality Date  . Cholecystectomy    . Tubal ligation    . Laparoscopy    . Foot surgery    . Eye surgery       Recommendations for primary care physician for things to follow:   Follow MRSA swab cultures and blood culture results follow glycemic control closely.   Discharge Diagnoses:   Principal Problem:   Hypoglycemia Active Problems:   OSA (obstructive sleep apnea)   Graves' disease with exophthalmos   Leucocytosis    Discharge Condition: stable   Diet recommendation: See  Discharge Instructions below   Consults none    History of present illness and  Hospital Course:     Kindly see H&P for history of present illness and admission details, please review complete Labs, Consult reports and Test reports for all details in brief Vanessa [name], is a 57 y.o. female, patient was admitted for hypoglycemia in the setting of taking oral hypoglycemic agents and recent diagnosis of type 2 diabetes mellitus, all resolved but stable glucose levels after glipizide has been held. She will be provided with glucose testing supplies, her glipizide dose will be half, we'll request PCP to follow glycemic control closely.   He should also complained of some painful skin lesions which appear to be localized skin infections one on scalp and the other on right thigh, minimal surrounding cellulitis if any, no signs of abscess, these are at best tender early pustular lesions, I will give her doxycycline for 7 days along with topical chlorhexidine washes and  Bactroban ointment. We'll request PCP to please follow MRSA nasal swab and blood culture results. Negative so far.    For her chronic obstructive sleep apnea, thyroid issues, COPD, recent history of shingles. Home dose acyclovir, nighttime CPAP, home dose Synthroid and when necessary nebulizer treatments will be continued unchanged.     Today   Subjective:   [name] today has no headache,no chest abdominal pain,no new weakness tingling or numbness, feels much better wants to go home today.    Objective:   Blood pressure 120/75, pulse 77, temperature 97.9 F (36.6 C), temperature source Oral, resp. rate 12, height 5\' 9"  (1.753 m), weight 101.8 kg (224 lb 6.9 oz), SpO2 100.00%.   Intake/Output Summary (Last 24 hours) at 06/27/12 1206 Last data filed at 06/27/12 0419  Gross per 24 hour  Intake      0 ml  Output   1700 ml  Net  -1700 ml    Exam Awake Alert, Oriented *3, No new F.N deficits, Normal  affect Isabella.AT,PERRAL Supple Neck,No JVD, No cervical lymphadenopathy appriciated.  Symmetrical Chest wall movement, Good air movement bilaterally, CTAB RRR,No Gallops,Rubs or new Murmurs, No Parasternal Heave +ve B.Sounds, Abd Soft, Non tender, No organomegaly appriciated, No rebound -guarding or rigidity. No Cyanosis, Clubbing or edema, No new Rash or bruise Few small tender papules one on scalp and one on right thigh noted.  Data Review   Major procedures and Radiology Reports - PLEASE review detailed and final reports for all details in brief -       Dg Chest 2 View  06/20/2012  *RADIOLOGY REPORT*  Clinical Data: Shortness of breath  CHEST - 2 VIEW  Comparison: 05/26/2012  Findings: The heart and pulmonary vascularity are within normal limits.  The lungs are clear bilaterally.  No focal infiltrate or sizable effusion is seen.  The osseous structures are within normal limits.  IMPRESSION: No acute abnormality noted.   Original Report Authenticated By: Alcide Clever, M.D.    Ct Head Wo Contrast  06/26/2012  *RADIOLOGY REPORT*  Clinical Data: The hypoglycemia.  Altered mental status.  CT HEAD WITHOUT CONTRAST  Technique:  Contiguous axial images were obtained from the base of the skull through the vertex without contrast.  Comparison: MRI brain 05/26/2012.  CT head 05/25/2012.  Findings: The ventricles and sulci are symmetrical without significant effacement, displacement, or dilatation. No mass effect or midline shift. No abnormal extra-axial fluid collections. The grey-white matter junction is distinct. Basal cisterns are not effaced. No acute intracranial hemorrhage. No depressed skull fractures.  Visualized paranasal sinuses and mastoid air cells are not opacified.  There appears to be an old fracture of the right and left medial orbital wall.  Motion artifact limits visualization vertically in the skull base.  No significant change since previous study.  IMPRESSION: No acute intracranial  abnormalities.   Original Report Authenticated By: Burman Nieves, M.D.    Dg Chest Portable 1 View  06/26/2012  *RADIOLOGY REPORT*  Clinical Data: Hypoglycemia.  PORTABLE CHEST - 1 VIEW  Comparison: 06/20/2012  Findings: There is a slightly shallow inspiration. The heart size and pulmonary vascularity are normal. The lungs appear clear and expanded without focal air space disease or consolidation. No blunting of the costophrenic angles.  No pneumothorax.  Mediastinal contours appear intact.  Old right rib fractures.  Stable appearance since previous study.  IMPRESSION: No evidence of active pulmonary disease.   Original Report Authenticated By: Burman Nieves, M.D.    Mr Card Morphology  Wo/w Cm  05/31/2012  *RADIOLOGY REPORT*  Clinical Data: VT  MR CARDIA MORPHOLOGY WITHOUT AND WITH CONTRAST  GE 1.5 T magnet with dedicated cardiac coil.  FIESTA sequences for function and morphology.  T1 and T1 fat sat images for tissue morphology.  10 minutes after 32 mL Multihance was injected, inversion recovery sequences to assess for myocardial delayed enhancement were done.  EF was calculated at a dedicated workstation.  Contrast: 32mL MULTIHANCE GADOBENATE DIMEGLUMINE 529 MG/ML IV SOLN  Comparison: None.  Findings: Normal left ventricular size and systolic function function, EF 59%.  Normal wall thickness and normal wall motion. Mild left atrial enlargement.  Normal right ventricular size and systolic function.  No evidence for fibrofatty infiltration of the RV, no RV wall motion abnormalities or regional aneurysms.  Normal right atrial size.  The aortic valve was trileaflet with mild aortic insufficiency and no aortic stenosis.  There was mild mitral regurgitation.  On delayed enhancement imaging, there was no myocardial enhancement.  Measurements:  LV EDV 174 mL  LV SV 71 mL  LV EF 59%  IMPRESSION: 1. Normal LV size and systolic function, EF 59%.  2. Normal RV size and systolic function with no evidence for ARVC.  3.  No delayed enhancement, so no definitive evidence for prior MI, infiltrative disease, or myocarditis.   Original Report Authenticated By: Marca Ancona     Micro Results     CBG (last 3)   Recent Labs  06/27/12 0619 06/27/12 0739 06/27/12 1040  GLUCAP 119* 167* 162*     Lab Results  Component Value Date   HGBA1C 6.4* 05/27/2012     No results found for this or any previous visit (from the past 240 hour(s)).   CBC w Diff: Lab Results  Component Value Date   WBC 14.1* 06/27/2012   HGB 13.1 06/27/2012   HCT 38.7 06/27/2012   PLT 194 06/27/2012   LYMPHOPCT 18 06/27/2012   MONOPCT 5 06/27/2012   EOSPCT 2 06/27/2012   BASOPCT 0 06/27/2012    CMP: Lab Results  Component Value Date   NA 134* 06/27/2012   K 3.6 06/27/2012   CL 95* 06/27/2012   CO2 27 06/27/2012   BUN 7 06/27/2012   CREATININE 0.97 06/27/2012   PROT 6.7 06/27/2012   ALBUMIN 3.6 06/27/2012   BILITOT 0.1* 06/27/2012   ALKPHOS 60 06/27/2012   AST 22 06/27/2012   ALT 20 06/27/2012  .   Discharge Instructions     Follow with Primary MD [name], MD in 3 days, is following up blood culture and MRSA swab culture results.  Get CBC, CMP, checked 3 days by Primary MD and again as instructed by your Primary MD.     Get Medicines reviewed and adjusted.  Accuchecks 4 times/day, Once in AM empty stomach and then before each meal. Log in all results and show them to your Prim.MD in 3 days. If any glucose reading is under 80 or above 300 call your Prim MD immidiately. Follow Low glucose instructions for glucose under 80 as instructed.  Please request your Prim.MD to go over all Hospital Tests and Procedure/Radiological results at the follow up, please get all Hospital records sent to your Prim MD by signing hospital release before you go home.  Activity: As tolerated with Full fall precautions use walker/cane & assistance as needed   Diet: Heart healthy low carbohydrate  For Heart failure patients - Check your Weight  same time everyday, if you gain over  2 pounds, or you develop in leg swelling, experience more shortness of breath or chest pain, call your Primary MD immediately. Follow Cardiac Low Salt Diet and 1.8 lit/day fluid restriction.  Disposition Home   If you experience worsening of your admission symptoms, develop shortness of breath, life threatening emergency, suicidal or homicidal thoughts you must seek medical attention immediately by calling 911 or calling your MD immediately  if symptoms less severe.  You Must read complete instructions/literature along with all the possible adverse reactions/side effects for all the Medicines you take and that have been prescribed to you. Take any new Medicines after you have completely understood and accpet all the possible adverse reactions/side effects.   Do not drive and provide baby sitting services if your were admitted for syncope or siezures until you have seen by Primary MD or a Neurologist and advised to do so again.  Do not drive when taking Pain medications.    Do not take more than prescribed Pain, Sleep and Anxiety Medications  Special Instructions: If you have smoked or chewed Tobacco  in the last 2 yrs please stop smoking, stop any regular Alcohol  and or any Recreational drug use.  Wear Seat belts while driving.    Follow-up Information   Follow up with [facility], MD. Schedule an appointment as soon as possible for a visit in 3 days.   Contact information:   [id number] [place] [address] [phone]         Discharge Medications     Medication List    STOP taking these medications       glipiZIDE 10 MG 24 hr tablet  Commonly known as:  GLUCOTROL XL     ibuprofen 200 MG tablet  Commonly known as:  ADVIL,MOTRIN      TAKE these medications       acyclovir 400 MG tablet  Commonly known as:  ZOVIRAX  Take 800 mg by mouth every morning.     aspirin 81 MG EC tablet  Commonly known as:  aspirin  EC  Take 1 tablet (81 mg total) by mouth daily. Swallow whole.     beclomethasone 80 MCG/ACT inhaler  Commonly known as:  QVAR  Inhale 2 puffs into the lungs every morning.     busPIRone 10 MG tablet  Commonly known as:  BUSPAR  Take 1 tablet (10 mg total) by mouth 3 (three) times daily.     carisoprodol 350 MG tablet  Commonly known as:  SOMA  Take 350 mg by mouth every morning.     chlorhexidine 4 % external liquid  Commonly known as:  HIBICLENS  Apply 15 mLs (1 application total) topically daily. Take baths for 5 days     clotrimazole-betamethasone cream  Commonly known as:  LOTRISONE  Apply 1 application topically 2 (two) times daily. For face     doxycycline 50 MG capsule  Commonly known as:  VIBRAMYCIN  Take 2 capsules (100 mg total) by mouth 2 (two) times daily.     gabapentin 600 MG tablet  Commonly known as:  NEURONTIN  Take 600 mg by mouth 3 (three) times daily.     glipiZIDE 5 MG tablet  Commonly known as:  GLUCOTROL  Take 1 tablet (5 mg total) by mouth daily before lunch.     glucose monitoring kit monitoring kit  1 each by Does not apply route 4 (four) times daily - after meals and at bedtime. 1 month Diabetic Testing  Supplies for QAC-QHS accuchecks.     hydrochlorothiazide 25 MG tablet  Commonly known as:  HYDRODIURIL  Take 25 mg by mouth every morning.     HYDROcodone-acetaminophen 10-325 MG per tablet  Commonly known as:  NORCO  Take 1 tablet by mouth every 4 (four) hours as needed for pain.     ipratropium-albuterol 0.5-2.5 (3) MG/3ML Soln  Commonly known as:  DUONEB  Take 3 mLs by nebulization 4 (four) times daily - after meals and at bedtime. For COPD     levETIRAcetam 500 MG tablet  Commonly known as:  KEPPRA  Take 1 tablet (500 mg total) by mouth 2 (two) times daily.     levothyroxine 175 MCG tablet  Commonly known as:  SYNTHROID, LEVOTHROID  Take 175 mcg by mouth every morning.     lidocaine 5 %  Commonly known as:  LIDODERM  Place 1  patch onto the skin daily as needed. Remove & Discard patch within 12 hours or as directed by MD     magnesium hydroxide 400 MG/5ML suspension  Commonly known as:  MILK OF MAGNESIA  Take 30 mLs by mouth daily as needed. For constipation     metFORMIN 1000 MG tablet  Commonly known as:  GLUCOPHAGE  Take 1,000 mg by mouth 2 (two) times daily.     mirtazapine 45 MG tablet  Commonly known as:  REMERON  Take 45 mg by mouth at bedtime. For insomnia     mupirocin ointment 2 %  Commonly known as:  BACTROBAN  Apply topically 3 (three) times daily. Nares for 5 days     nicotine 21 mg/24hr patch  Commonly known as:  NICODERM CQ - dosed in mg/24 hours  Place 1 patch onto the skin daily.     pantoprazole 40 MG tablet  Commonly known as:  PROTONIX  Take 40 mg by mouth daily.     PROAIR HFA 108 (90 BASE) MCG/ACT inhaler  Generic drug:  albuterol  Inhale 2 puffs into the lungs every 6 (six) hours as needed. For COPD     PROZAC 20 MG capsule  Generic drug:  FLUoxetine  Take 60 mg by mouth every morning.     simvastatin 40 MG tablet  Commonly known as:  ZOCOR  Take 40 mg by mouth at bedtime.     traMADol 50 MG tablet  Commonly known as:  ULTRAM  Take 1 tablet (50 mg total) by mouth every 8 (eight) hours as needed for pain.     verapamil 40 MG tablet  Commonly known as:  CALAN  Take 1 tablet (40 mg total) by mouth 3 (three) times daily.           Total Time in preparing paper work, data evaluation and todays exam - 35 minutes  Leroy Sea M.D on 06/27/2012 at 12:06 PM  [facility] Office  [phone]

## 2012-06-29 ENCOUNTER — Encounter: Payer: Medicaid Other | Admitting: Physician Assistant

## 2012-07-03 LAB — CULTURE, BLOOD (ROUTINE X 2): Culture: NO GROWTH

## 2012-07-06 ENCOUNTER — Ambulatory Visit: Payer: Medicaid Other | Admitting: Pulmonary Disease

## 2012-07-12 ENCOUNTER — Encounter (HOSPITAL_COMMUNITY): Payer: Self-pay | Admitting: Emergency Medicine

## 2012-07-12 ENCOUNTER — Emergency Department (HOSPITAL_COMMUNITY)
Admission: EM | Admit: 2012-07-12 | Discharge: 2012-07-13 | Disposition: A | Discharge status: Home / Self Care | Payer: Medicaid Other | Attending: Emergency Medicine | Admitting: Emergency Medicine

## 2012-07-12 DIAGNOSIS — M545 Low back pain, unspecified: Secondary | ICD-10-CM | POA: Insufficient documentation

## 2012-07-12 DIAGNOSIS — F17200 Nicotine dependence, unspecified, uncomplicated: Secondary | ICD-10-CM | POA: Insufficient documentation

## 2012-07-12 DIAGNOSIS — E119 Type 2 diabetes mellitus without complications: Secondary | ICD-10-CM | POA: Insufficient documentation

## 2012-07-12 DIAGNOSIS — M79609 Pain in unspecified limb: Secondary | ICD-10-CM | POA: Insufficient documentation

## 2012-07-12 DIAGNOSIS — Z780 Asymptomatic menopausal state: Secondary | ICD-10-CM | POA: Insufficient documentation

## 2012-07-12 DIAGNOSIS — R011 Cardiac murmur, unspecified: Secondary | ICD-10-CM | POA: Insufficient documentation

## 2012-07-12 DIAGNOSIS — Z8639 Personal history of other endocrine, nutritional and metabolic disease: Secondary | ICD-10-CM | POA: Insufficient documentation

## 2012-07-12 DIAGNOSIS — G4730 Sleep apnea, unspecified: Secondary | ICD-10-CM | POA: Insufficient documentation

## 2012-07-12 DIAGNOSIS — R209 Unspecified disturbances of skin sensation: Secondary | ICD-10-CM | POA: Insufficient documentation

## 2012-07-12 DIAGNOSIS — F3289 Other specified depressive episodes: Secondary | ICD-10-CM | POA: Insufficient documentation

## 2012-07-12 DIAGNOSIS — M129 Arthropathy, unspecified: Secondary | ICD-10-CM | POA: Insufficient documentation

## 2012-07-12 DIAGNOSIS — Z862 Personal history of diseases of the blood and blood-forming organs and certain disorders involving the immune mechanism: Secondary | ICD-10-CM | POA: Insufficient documentation

## 2012-07-12 DIAGNOSIS — Z79899 Other long term (current) drug therapy: Secondary | ICD-10-CM | POA: Insufficient documentation

## 2012-07-12 DIAGNOSIS — R52 Pain, unspecified: Secondary | ICD-10-CM | POA: Insufficient documentation

## 2012-07-12 DIAGNOSIS — Z8739 Personal history of other diseases of the musculoskeletal system and connective tissue: Secondary | ICD-10-CM | POA: Insufficient documentation

## 2012-07-12 DIAGNOSIS — J449 Chronic obstructive pulmonary disease, unspecified: Secondary | ICD-10-CM | POA: Insufficient documentation

## 2012-07-12 DIAGNOSIS — M549 Dorsalgia, unspecified: Secondary | ICD-10-CM | POA: Insufficient documentation

## 2012-07-12 DIAGNOSIS — E785 Hyperlipidemia, unspecified: Secondary | ICD-10-CM | POA: Insufficient documentation

## 2012-07-12 DIAGNOSIS — IMO0002 Reserved for concepts with insufficient information to code with codable children: Secondary | ICD-10-CM | POA: Insufficient documentation

## 2012-07-12 DIAGNOSIS — Z8719 Personal history of other diseases of the digestive system: Secondary | ICD-10-CM | POA: Insufficient documentation

## 2012-07-12 DIAGNOSIS — R51 Headache: Secondary | ICD-10-CM | POA: Insufficient documentation

## 2012-07-12 DIAGNOSIS — Z8709 Personal history of other diseases of the respiratory system: Secondary | ICD-10-CM | POA: Insufficient documentation

## 2012-07-12 DIAGNOSIS — G4733 Obstructive sleep apnea (adult) (pediatric): Secondary | ICD-10-CM | POA: Insufficient documentation

## 2012-07-12 DIAGNOSIS — F329 Major depressive disorder, single episode, unspecified: Secondary | ICD-10-CM | POA: Insufficient documentation

## 2012-07-12 DIAGNOSIS — F411 Generalized anxiety disorder: Secondary | ICD-10-CM | POA: Insufficient documentation

## 2012-07-12 DIAGNOSIS — J4489 Other specified chronic obstructive pulmonary disease: Secondary | ICD-10-CM | POA: Insufficient documentation

## 2012-07-12 DIAGNOSIS — G629 Polyneuropathy, unspecified: Secondary | ICD-10-CM

## 2012-07-12 DIAGNOSIS — I10 Essential (primary) hypertension: Secondary | ICD-10-CM | POA: Insufficient documentation

## 2012-07-12 DIAGNOSIS — Z8619 Personal history of other infectious and parasitic diseases: Secondary | ICD-10-CM | POA: Insufficient documentation

## 2012-07-12 DIAGNOSIS — G589 Mononeuropathy, unspecified: Secondary | ICD-10-CM | POA: Insufficient documentation

## 2012-07-12 DIAGNOSIS — G8929 Other chronic pain: Secondary | ICD-10-CM | POA: Insufficient documentation

## 2012-07-12 MED ORDER — OXYCODONE-ACETAMINOPHEN 5-325 MG PO TABS
2.0000 | ORAL_TABLET | Freq: Once | ORAL | Status: AC
  Administered 2012-07-12: 2 via ORAL
  Filled 2012-07-12: qty 2

## 2012-07-12 NOTE — ED Provider Notes (Signed)
History     CSN: [id number]  Arrival date & time 07/12/12  2142   First MD Initiated Contact with Patient 07/12/12 2308      Chief Complaint  Patient presents with  . Generalized Body Aches  . Toe Pain  . Back Pain     The history is provided by the patient.  reports hx of chronic pain, previously on Vicodin 10/325. No longer taking this. Referred to chronic pain clinic, awaiting appointment. Reports burning pain in her bilateral feet. Also reports low back pain which is usually controlled by the vicodin. Requests pain medicine, preferably in a "shot". Denies fever chills, weakness of extremities. No recent fall or trauma. No use of anticoagulants. No urinary complaints. No saddle anesthesia related complaints  Past Medical History  Diagnosis Date  . Hypertension   . Heart murmur   . Asthma   . Emphysema   . Diabetes mellitus   . Hyperlipidemia   . Allergic rhinitis   . Chronic headache   . OSA (obstructive sleep apnea)   . Neuropathy   . Grave's disease   . Herniated disc   . COPD (chronic obstructive pulmonary disease)   . Sleep apnea   . Menopause   . Hernia, umbilical   . Anxiety   . Depression   . Back pain, chronic   . Shingles   . Herniated disc   . Arthritis     rheumatoid  . 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 Generalized anxiety disorder 05/26/2012  . Chronic back pain 05/27/2012    Secondary to L4-5 herniated disc per patient    Past Surgical History  Procedure Laterality Date  . Cholecystectomy    . Tubal ligation    . Laparoscopy    . Foot surgery    . Eye surgery      Family History  Problem Relation Age of Onset  . Emphysema Mother   . Allergies Mother   . Allergies Daughter   . Asthma Mother   . Asthma Brother   . Heart disease Mother   . Rheum arthritis Mother   . Breast cancer Other     aunt  . Lung cancer Brother   . Throat cancer Brother     History   Substance Use Topics  . Smoking status: Current Every Day Smoker -- 1.00 packs/day for 40 years    Types: Cigarettes  . Smokeless tobacco: Never Used  . Alcohol Use: No    OB History   Grav Para Term Preterm Abortions TAB SAB Ect Mult Living   5 3   2  2   3       Review of Systems  All other systems reviewed and are negative.    Allergies  Ciprofloxacin and Penicillins  Home Medications   Current Outpatient Rx  Name  Route  Sig  Dispense  Refill  . acyclovir (ZOVIRAX) 400 MG tablet   Oral   Take 800 mg by mouth every morning.          Marland Kitchen albuterol (PROAIR HFA) 108 (90 BASE) MCG/ACT inhaler   Inhalation   Inhale 2 puffs into the lungs every 6 (six) hours as needed. For COPD         . aspirin (ASPIRIN EC) 81 MG EC tablet   Oral   Take 1 tablet (81 mg total) by mouth daily. Swallow whole.   30 tablet   12   .  beclomethasone (QVAR) 80 MCG/ACT inhaler   Inhalation   Inhale 2 puffs into the lungs every morning.          . busPIRone (BUSPAR) 10 MG tablet   Oral   Take 1 tablet (10 mg total) by mouth 3 (three) times daily.         . carisoprodol (SOMA) 350 MG tablet   Oral   Take 350 mg by mouth every morning.          . clotrimazole-betamethasone (LOTRISONE) cream   Topical   Apply 1 application topically 2 (two) times daily. For face         . doxycycline (VIBRAMYCIN) 50 MG capsule   Oral   Take 2 capsules (100 mg total) by mouth 2 (two) times daily.   14 capsule   0   . FLUoxetine (PROZAC) 20 MG capsule   Oral   Take 60 mg by mouth every morning.          . gabapentin (NEURONTIN) 600 MG tablet   Oral   Take 600 mg by mouth 3 (three) times daily.         Marland Kitchen glipiZIDE (GLUCOTROL) 5 MG tablet   Oral   Take 1 tablet (5 mg total) by mouth daily before lunch.   30 tablet   0   . hydrochlorothiazide (HYDRODIURIL) 25 MG tablet   Oral   Take 25 mg by mouth every morning.          Marland Kitchen HYDROcodone-acetaminophen (NORCO) 10-325 MG per  tablet   Oral   Take 1 tablet by mouth every 4 (four) hours as needed for pain.   20 tablet   0   . ipratropium-albuterol (DUONEB) 0.5-2.5 (3) MG/3ML SOLN   Nebulization   Take 3 mLs by nebulization 4 (four) times daily - after meals and at bedtime. For COPD         . levETIRAcetam (KEPPRA) 500 MG tablet   Oral   Take 1 tablet (500 mg total) by mouth 2 (two) times daily.   60 tablet   3   . levothyroxine (SYNTHROID, LEVOTHROID) 175 MCG tablet   Oral   Take 175 mcg by mouth every morning.          . lidocaine (LIDODERM) 5 %   Transdermal   Place 1 patch onto the skin daily as needed. Remove & Discard patch within 12 hours or as directed by MD         . metFORMIN (GLUCOPHAGE) 1000 MG tablet   Oral   Take 1,000 mg by mouth 2 (two) times daily.          . mirtazapine (REMERON) 45 MG tablet   Oral   Take 45 mg by mouth at bedtime. For insomnia         . mupirocin ointment (BACTROBAN) 2 %   Topical   Apply topically 3 (three) times daily. Nares for 5 days   22 g   0   . pantoprazole (PROTONIX) 40 MG tablet   Oral   Take 40 mg by mouth daily.         . simvastatin (ZOCOR) 40 MG tablet   Oral   Take 40 mg by mouth at bedtime.          . traMADol (ULTRAM) 50 MG tablet   Oral   Take 1 tablet (50 mg total) by mouth every 8 (eight) hours as needed for pain.   15 tablet  0   . verapamil (CALAN) 40 MG tablet   Oral   Take 1 tablet (40 mg total) by mouth 3 (three) times daily.   90 tablet   3   . glucose monitoring kit (FREESTYLE) monitoring kit   Does not apply   1 each by Does not apply route 4 (four) times daily - after meals and at bedtime. 1 month Diabetic Testing Supplies for QAC-QHS accuchecks.   1 each   1     BP 161/136  Pulse 99  Temp(Src) 99.2 F (37.3 C) (Oral)  Resp 25  SpO2 97%  Physical Exam  Nursing note and vitals reviewed. Constitutional: She is oriented to person, place, and time. She appears well-developed and  well-nourished. No distress.  HENT:  Head: Normocephalic and atraumatic.  Eyes: EOM are normal.  Neck: Normal range of motion.  Cardiovascular: Normal rate, regular rhythm and normal heart sounds.   Pulmonary/Chest: Effort normal and breath sounds normal.  Abdominal: Soft. She exhibits no distension. There is no tenderness.  Musculoskeletal: Normal range of motion.  Normal pulses in bilateral feet. Para lumbar tenderness without L spine tenderness or step off. Normal strength in major muscle groups in lower extremities  Neurological: She is alert and oriented to person, place, and time.  Skin: Skin is warm and dry.  Psychiatric: She has a normal mood and affect. Judgment normal.    ED Course  Procedures (including critical care time)  Labs Reviewed - No data to display No results found.   1. Chronic pain   2. Neuropathy       MDM  Chronic pain exacerbation. Out of her vicodin. Dc home in good condition. referral back to pcp.         Lyanne Co, MD 07/12/12 [phone]

## 2012-07-12 NOTE — ED Notes (Signed)
PT. REPORTS GENERALIZED BODY ACHES WITH BILATERAL TOE PAIN AND BACK PAIN FOR SEVERAL DAYS .

## 2012-07-15 ENCOUNTER — Encounter: Payer: Self-pay | Admitting: Physician Assistant

## 2012-07-22 ENCOUNTER — Emergency Department (HOSPITAL_COMMUNITY)
Admission: EM | Admit: 2012-07-22 | Discharge: 2012-07-22 | Disposition: A | Discharge status: Home / Self Care | Payer: Medicaid Other | Attending: Emergency Medicine | Admitting: Emergency Medicine

## 2012-07-22 ENCOUNTER — Encounter (HOSPITAL_COMMUNITY): Payer: Self-pay | Admitting: Emergency Medicine

## 2012-07-22 DIAGNOSIS — G629 Polyneuropathy, unspecified: Secondary | ICD-10-CM

## 2012-07-22 DIAGNOSIS — Z923 Personal history of irradiation: Secondary | ICD-10-CM | POA: Insufficient documentation

## 2012-07-22 DIAGNOSIS — E119 Type 2 diabetes mellitus without complications: Secondary | ICD-10-CM

## 2012-07-22 DIAGNOSIS — F17200 Nicotine dependence, unspecified, uncomplicated: Secondary | ICD-10-CM | POA: Insufficient documentation

## 2012-07-22 DIAGNOSIS — F411 Generalized anxiety disorder: Secondary | ICD-10-CM

## 2012-07-22 DIAGNOSIS — Z780 Asymptomatic menopausal state: Secondary | ICD-10-CM | POA: Insufficient documentation

## 2012-07-22 DIAGNOSIS — R011 Cardiac murmur, unspecified: Secondary | ICD-10-CM | POA: Insufficient documentation

## 2012-07-22 DIAGNOSIS — M549 Dorsalgia, unspecified: Secondary | ICD-10-CM | POA: Insufficient documentation

## 2012-07-22 DIAGNOSIS — Z880 Allergy status to penicillin: Secondary | ICD-10-CM | POA: Insufficient documentation

## 2012-07-22 DIAGNOSIS — E785 Hyperlipidemia, unspecified: Secondary | ICD-10-CM | POA: Insufficient documentation

## 2012-07-22 DIAGNOSIS — Z79899 Other long term (current) drug therapy: Secondary | ICD-10-CM | POA: Insufficient documentation

## 2012-07-22 DIAGNOSIS — F3289 Other specified depressive episodes: Secondary | ICD-10-CM | POA: Insufficient documentation

## 2012-07-22 DIAGNOSIS — J449 Chronic obstructive pulmonary disease, unspecified: Secondary | ICD-10-CM | POA: Insufficient documentation

## 2012-07-22 DIAGNOSIS — Z8709 Personal history of other diseases of the respiratory system: Secondary | ICD-10-CM | POA: Insufficient documentation

## 2012-07-22 DIAGNOSIS — Z8719 Personal history of other diseases of the digestive system: Secondary | ICD-10-CM | POA: Insufficient documentation

## 2012-07-22 DIAGNOSIS — E0500 Thyrotoxicosis with diffuse goiter without thyrotoxic crisis or storm: Secondary | ICD-10-CM

## 2012-07-22 DIAGNOSIS — J4489 Other specified chronic obstructive pulmonary disease: Secondary | ICD-10-CM | POA: Insufficient documentation

## 2012-07-22 DIAGNOSIS — G8929 Other chronic pain: Secondary | ICD-10-CM

## 2012-07-22 DIAGNOSIS — R51 Headache: Secondary | ICD-10-CM | POA: Insufficient documentation

## 2012-07-22 DIAGNOSIS — M129 Arthropathy, unspecified: Secondary | ICD-10-CM | POA: Insufficient documentation

## 2012-07-22 DIAGNOSIS — I10 Essential (primary) hypertension: Secondary | ICD-10-CM | POA: Insufficient documentation

## 2012-07-22 DIAGNOSIS — Z8739 Personal history of other diseases of the musculoskeletal system and connective tissue: Secondary | ICD-10-CM | POA: Insufficient documentation

## 2012-07-22 DIAGNOSIS — G4730 Sleep apnea, unspecified: Secondary | ICD-10-CM | POA: Insufficient documentation

## 2012-07-22 DIAGNOSIS — G4733 Obstructive sleep apnea (adult) (pediatric): Secondary | ICD-10-CM | POA: Insufficient documentation

## 2012-07-22 DIAGNOSIS — Z8619 Personal history of other infectious and parasitic diseases: Secondary | ICD-10-CM | POA: Insufficient documentation

## 2012-07-22 DIAGNOSIS — IMO0002 Reserved for concepts with insufficient information to code with codable children: Secondary | ICD-10-CM | POA: Insufficient documentation

## 2012-07-22 DIAGNOSIS — F329 Major depressive disorder, single episode, unspecified: Secondary | ICD-10-CM | POA: Insufficient documentation

## 2012-07-22 DIAGNOSIS — G589 Mononeuropathy, unspecified: Secondary | ICD-10-CM | POA: Insufficient documentation

## 2012-07-22 MED ORDER — HYDROCODONE-ACETAMINOPHEN 5-325 MG PO TABS
2.0000 | ORAL_TABLET | Freq: Once | ORAL | Status: AC
  Administered 2012-07-22: 2 via ORAL
  Filled 2012-07-22: qty 2

## 2012-07-22 MED ORDER — CARISOPRODOL 350 MG PO TABS
350.0000 mg | ORAL_TABLET | Freq: Once | ORAL | Status: AC
  Administered 2012-07-22: 350 mg via ORAL
  Filled 2012-07-22: qty 1

## 2012-07-22 MED ORDER — HYDROMORPHONE HCL PF 1 MG/ML IJ SOLN: 0.5000 mg | Freq: Once | INTRAMUSCULAR | Status: DC

## 2012-07-22 NOTE — ED Provider Notes (Signed)
History     CSN: [id number]  Arrival date & time 07/22/12  1449   First MD Initiated Contact with Patient 07/22/12 [id number]      Chief Complaint  Patient presents with  . Headache  . multiple complaints    (Consider location/radiation/quality/duration/timing/severity/associated sxs/prior treatment) The history is provided by the patient and medical records. No language interpreter was used.    Vanessa [name] is a 57 y.o. female  with a hx of chronic pain and chronic headache, Graves Disease, HTN, asthma, DM, sleep apnea,  presents to the Emergency Department complaining of gradual, persistent, progressively worsening exacerbation of her chronic pain onset > 1 month ago.  Pt states he PCP used to write her pain medications, but the new MD does not.  She states she has been out of her pain medications for 7 days and the pain is unbearable.  She states she has applied for an appointment with a pain management clinic but cannot be seen until Monday.  Pt states last time she was here she was not given an Rx for pain medication. Associated symptoms include generalized pain including headache and back pain.  Pt has tried ibuprofen and tylenol without relief and nothing makes it better.  Pt denies new pain, fever, chills, neck pain, SOB, abdominal pain, nausea, vomiting, diarrhea, weakness, dizziness, numbness, tingling, loss of bowel or bladder function, difficulty walking, changes in vision, syncope, dysuria, hematuria.  Pt states she is tired of hurting and wants and Rx for pain control.     Past Medical History  Diagnosis Date  . Hypertension   . Heart murmur   . Asthma   . Emphysema   . Diabetes mellitus   . Hyperlipidemia   . Allergic rhinitis   . Chronic headache   . OSA (obstructive sleep apnea)   . Neuropathy   . Grave's disease   . Herniated disc   . COPD (chronic obstructive pulmonary disease)   . Sleep apnea   . Menopause   . Hernia, umbilical   . Anxiety   . Depression    . Back pain, chronic   . Shingles   . Herniated disc   . Arthritis     rheumatoid  . 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 Generalized anxiety disorder 05/26/2012  . Chronic back pain 05/27/2012    Secondary to L4-5 herniated disc per patient    Past Surgical History  Procedure Laterality Date  . Cholecystectomy    . Tubal ligation    . Laparoscopy    . Foot surgery    . Eye surgery      Family History  Problem Relation Age of Onset  . Emphysema Mother   . Allergies Mother   . Allergies Daughter   . Asthma Mother   . Asthma Brother   . Heart disease Mother   . Rheum arthritis Mother   . Breast cancer Other     aunt  . Lung cancer Brother   . Throat cancer Brother     History  Substance Use Topics  . Smoking status: Current Every Day Smoker -- 1.00 packs/day for 40 years    Types: Cigarettes  . Smokeless tobacco: Never Used  . Alcohol Use: No    OB History   Grav Para Term Preterm Abortions TAB SAB Ect Mult Living   5 3   2  2    3  Review of Systems  Constitutional: Negative for fever, diaphoresis, appetite change, fatigue and unexpected weight change.  HENT: Negative for mouth sores and neck stiffness.   Eyes: Negative for visual disturbance.  Respiratory: Negative for cough, chest tightness, shortness of breath and wheezing.   Cardiovascular: Negative for chest pain.  Gastrointestinal: Negative for nausea, vomiting, abdominal pain, diarrhea and constipation.  Endocrine: Negative for polydipsia, polyphagia and polyuria.  Genitourinary: Negative for dysuria, urgency, frequency and hematuria.  Musculoskeletal: Positive for myalgias, back pain and arthralgias. Negative for joint swelling and gait problem.  Skin: Negative for rash.  Allergic/Immunologic: Negative for immunocompromised state.  Neurological: Positive for headaches. Negative for syncope and light-headedness.  Hematological:  Does not bruise/bleed easily.  Psychiatric/Behavioral: Negative for sleep disturbance. The patient is not nervous/anxious.     Allergies  Ciprofloxacin and Penicillins  Home Medications   Current Outpatient Rx  Name  Route  Sig  Dispense  Refill  . acyclovir (ZOVIRAX) 400 MG tablet   Oral   Take 800 mg by mouth every morning.          Marland Kitchen albuterol (PROAIR HFA) 108 (90 BASE) MCG/ACT inhaler   Inhalation   Inhale 2 puffs into the lungs every 6 (six) hours as needed. For COPD         . aspirin (ASPIRIN EC) 81 MG EC tablet   Oral   Take 1 tablet (81 mg total) by mouth daily. Swallow whole.   30 tablet   12   . beclomethasone (QVAR) 80 MCG/ACT inhaler   Inhalation   Inhale 2 puffs into the lungs every morning.          . busPIRone (BUSPAR) 10 MG tablet   Oral   Take 1 tablet (10 mg total) by mouth 3 (three) times daily.         . carisoprodol (SOMA) 350 MG tablet   Oral   Take 350 mg by mouth every morning.          . clotrimazole-betamethasone (LOTRISONE) cream   Topical   Apply 1 application topically 2 (two) times daily. For face         . FLUoxetine (PROZAC) 20 MG capsule   Oral   Take 60 mg by mouth every morning.          . gabapentin (NEURONTIN) 800 MG tablet   Oral   Take 800 mg by mouth 3 (three) times daily.         Marland Kitchen glipiZIDE (GLUCOTROL) 5 MG tablet   Oral   Take 1 tablet (5 mg total) by mouth daily before lunch.   30 tablet   0   . hydrochlorothiazide (HYDRODIURIL) 25 MG tablet   Oral   Take 25 mg by mouth every morning.          Marland Kitchen HYDROcodone-acetaminophen (NORCO) 10-325 MG per tablet   Oral   Take 1 tablet by mouth every 4 (four) hours as needed for pain.   20 tablet   0   . ipratropium-albuterol (DUONEB) 0.5-2.5 (3) MG/3ML SOLN   Nebulization   Take 3 mLs by nebulization 4 (four) times daily - after meals and at bedtime. For COPD         . levETIRAcetam (KEPPRA) 500 MG tablet   Oral   Take 1 tablet (500 mg total) by  mouth 2 (two) times daily.   60 tablet   3   . levothyroxine (SYNTHROID, LEVOTHROID) 175 MCG tablet   Oral  Take 175 mcg by mouth every morning.          . lidocaine (LIDODERM) 5 %   Transdermal   Place 1 patch onto the skin daily as needed. Remove & Discard patch within 12 hours or as directed by MD         . metFORMIN (GLUCOPHAGE) 1000 MG tablet   Oral   Take 1,000 mg by mouth 2 (two) times daily.          . mirtazapine (REMERON) 45 MG tablet   Oral   Take 45 mg by mouth at bedtime. For insomnia         . mupirocin ointment (BACTROBAN) 2 %   Topical   Apply topically 3 (three) times daily. Nares for 5 days   22 g   0   . pantoprazole (PROTONIX) 40 MG tablet   Oral   Take 40 mg by mouth daily.         . traMADol (ULTRAM) 50 MG tablet   Oral   Take 1 tablet (50 mg total) by mouth every 8 (eight) hours as needed for pain.   15 tablet   0   . verapamil (CALAN) 40 MG tablet   Oral   Take 1 tablet (40 mg total) by mouth 3 (three) times daily.   90 tablet   3     BP 135/76  Pulse 86  Temp(Src) 99.1 F (37.3 C) (Oral)  Resp 20  SpO2 99%  Physical Exam  Nursing note and vitals reviewed. Constitutional: She is oriented to person, place, and time. She appears well-developed and well-nourished. No distress.  HENT:  Head: Normocephalic and atraumatic.  Mouth/Throat: Oropharynx is clear and moist. No oropharyngeal exudate.  Eyes: Conjunctivae and EOM are normal. Pupils are equal, round, and reactive to light. No scleral icterus.  Neck: Normal range of motion and full passive range of motion without pain. Neck supple. Normal range of motion present.  Cardiovascular: Normal rate, regular rhythm and intact distal pulses.   Pulmonary/Chest: Effort normal and breath sounds normal. No respiratory distress. She has no wheezes. She has no rales. She exhibits no tenderness.  Abdominal: Soft. Bowel sounds are normal. She exhibits no distension and no mass. There is  no tenderness. There is no rebound and no guarding.  Musculoskeletal: Normal range of motion. She exhibits no edema and no tenderness.       Lumbar back: She exhibits tenderness (mild, parapsinal to palpation; no midline tenderness) and pain. She exhibits normal range of motion, no bony tenderness, no swelling, no deformity and no spasm.  Lymphadenopathy:    She has no cervical adenopathy.  Neurological: She is alert and oriented to person, place, and time. She has normal reflexes. She exhibits normal muscle tone. Coordination normal.  Speech is clear and goal oriented, follows commands Major Cranial nerves without deficit, no facial droop Normal strength in upper and lower extremities bilaterally including dorsiflexion and plantar flexion, strong and equal grip strength Sensation normal to light and sharp touch Moves extremities without ataxia, coordination intact Normal gait and balance  Skin: Skin is warm and dry. No rash noted. She is not diaphoretic. No erythema.  Psychiatric: She has a normal mood and affect.    ED Course  Procedures (including critical care time)  Labs Reviewed - No data to display No results found.   1. Chronic pain   2. Chronic back pain   3. Diabetes mellitus   4. Graves' disease with exophthalmos  5. Generalized anxiety disorder   6. Neuropathy       MDM  [name] presents with chronic pain exacerbation.  Mild TTP of L-spine paraspinal muscles but no other focal pain findings on exam. No neurological deficits on exam.  Patient can walk without difficulty.  No loss of bowel or bladder control.  No concern for cauda equina.  No fever, night sweats, weight loss, h/o cancer.  Pt pain resolved after PO medications.  Discussed with patient the need to f/u with PCP and pain management.  Will not write Rx at this time. Pt alert, oriented, NAD, nontoxic, nonseptic appearing.  I have also discussed reasons to return immediately to the ER.  Patient  expresses understanding and agrees with plan.          Dahlia Client Uzziah Rigg, PA-C 07/22/12 1739

## 2012-07-22 NOTE — ED Notes (Signed)
MD at bedside. 

## 2012-07-22 NOTE — ED Provider Notes (Signed)
Medical screening examination/treatment/procedure(s) were performed by non-physician practitioner and as supervising physician I was immediately available for consultation/collaboration.    Wynne Jury R Mickelle Goupil, MD 07/22/12 1756 

## 2012-07-22 NOTE — ED Notes (Signed)
Pt presenting to ed with multiple c/o headache pain, low back pain, coccyx, pain all over including my eyeballs.

## 2012-08-12 ENCOUNTER — Encounter (HOSPITAL_COMMUNITY): Payer: Self-pay | Admitting: Emergency Medicine

## 2012-08-12 ENCOUNTER — Emergency Department (HOSPITAL_COMMUNITY)
Admission: EM | Admit: 2012-08-12 | Discharge: 2012-08-12 | Disposition: A | Discharge status: Home / Self Care | Payer: Medicaid Other | Attending: Emergency Medicine | Admitting: Emergency Medicine

## 2012-08-12 DIAGNOSIS — F411 Generalized anxiety disorder: Secondary | ICD-10-CM | POA: Insufficient documentation

## 2012-08-12 DIAGNOSIS — Z8639 Personal history of other endocrine, nutritional and metabolic disease: Secondary | ICD-10-CM | POA: Insufficient documentation

## 2012-08-12 DIAGNOSIS — F3289 Other specified depressive episodes: Secondary | ICD-10-CM | POA: Insufficient documentation

## 2012-08-12 DIAGNOSIS — F329 Major depressive disorder, single episode, unspecified: Secondary | ICD-10-CM | POA: Insufficient documentation

## 2012-08-12 DIAGNOSIS — Z79899 Other long term (current) drug therapy: Secondary | ICD-10-CM | POA: Insufficient documentation

## 2012-08-12 DIAGNOSIS — Z862 Personal history of diseases of the blood and blood-forming organs and certain disorders involving the immune mechanism: Secondary | ICD-10-CM | POA: Insufficient documentation

## 2012-08-12 DIAGNOSIS — Z8739 Personal history of other diseases of the musculoskeletal system and connective tissue: Secondary | ICD-10-CM | POA: Insufficient documentation

## 2012-08-12 DIAGNOSIS — M549 Dorsalgia, unspecified: Secondary | ICD-10-CM

## 2012-08-12 DIAGNOSIS — I10 Essential (primary) hypertension: Secondary | ICD-10-CM | POA: Insufficient documentation

## 2012-08-12 DIAGNOSIS — G8929 Other chronic pain: Secondary | ICD-10-CM | POA: Insufficient documentation

## 2012-08-12 DIAGNOSIS — Z8669 Personal history of other diseases of the nervous system and sense organs: Secondary | ICD-10-CM | POA: Insufficient documentation

## 2012-08-12 DIAGNOSIS — F17200 Nicotine dependence, unspecified, uncomplicated: Secondary | ICD-10-CM | POA: Insufficient documentation

## 2012-08-12 DIAGNOSIS — J4489 Other specified chronic obstructive pulmonary disease: Secondary | ICD-10-CM | POA: Insufficient documentation

## 2012-08-12 DIAGNOSIS — Z8719 Personal history of other diseases of the digestive system: Secondary | ICD-10-CM | POA: Insufficient documentation

## 2012-08-12 DIAGNOSIS — R197 Diarrhea, unspecified: Secondary | ICD-10-CM | POA: Insufficient documentation

## 2012-08-12 DIAGNOSIS — G4733 Obstructive sleep apnea (adult) (pediatric): Secondary | ICD-10-CM | POA: Insufficient documentation

## 2012-08-12 DIAGNOSIS — J449 Chronic obstructive pulmonary disease, unspecified: Secondary | ICD-10-CM | POA: Insufficient documentation

## 2012-08-12 DIAGNOSIS — R011 Cardiac murmur, unspecified: Secondary | ICD-10-CM | POA: Insufficient documentation

## 2012-08-12 DIAGNOSIS — E119 Type 2 diabetes mellitus without complications: Secondary | ICD-10-CM | POA: Insufficient documentation

## 2012-08-12 DIAGNOSIS — Z880 Allergy status to penicillin: Secondary | ICD-10-CM | POA: Insufficient documentation

## 2012-08-12 LAB — CBC WITH DIFFERENTIAL/PLATELET
Basophils Relative: 0 % (ref 0–1)
Eosinophils Absolute: 0.1 10*3/uL (ref 0.0–0.7)
Eosinophils Relative: 1 % (ref 0–5)
HCT: 32.7 % — ABNORMAL LOW (ref 36.0–46.0)
Hemoglobin: 11.3 g/dL — ABNORMAL LOW (ref 12.0–15.0)
MCH: 29.4 pg (ref 26.0–34.0)
MCHC: 34.6 g/dL (ref 30.0–36.0)
MCV: 84.9 fL (ref 78.0–100.0)
Monocytes Absolute: 0.6 10*3/uL (ref 0.1–1.0)
Monocytes Relative: 5 % (ref 3–12)

## 2012-08-12 LAB — COMPREHENSIVE METABOLIC PANEL
Albumin: 3.5 g/dL (ref 3.5–5.2)
BUN: 8 mg/dL (ref 6–23)
Creatinine, Ser: 0.92 mg/dL (ref 0.50–1.10)
GFR calc Af Amer: 80 mL/min — ABNORMAL LOW (ref >90)
Glucose, Bld: 158 mg/dL — ABNORMAL HIGH (ref 70–99)
Total Protein: 6.2 g/dL (ref 6.0–8.3)

## 2012-08-12 LAB — GLUCOSE, CAPILLARY: Glucose-Capillary: 169 mg/dL — ABNORMAL HIGH (ref 70–99)

## 2012-08-12 LAB — URINALYSIS, ROUTINE W REFLEX MICROSCOPIC
Bilirubin Urine: NEGATIVE
Hgb urine dipstick: NEGATIVE
Ketones, ur: NEGATIVE mg/dL
Nitrite: NEGATIVE
pH: 5.5 (ref 5.0–8.0)

## 2012-08-12 MED ORDER — KETOROLAC TROMETHAMINE 30 MG/ML IJ SOLN
30.0000 mg | Freq: Once | INTRAMUSCULAR | Status: AC
  Administered 2012-08-12: 30 mg via INTRAVENOUS
  Filled 2012-08-12: qty 1

## 2012-08-12 MED ORDER — OXYCODONE-ACETAMINOPHEN 5-325 MG PO TABS: 2.0000 | ORAL_TABLET | ORAL | Status: DC | PRN

## 2012-08-12 MED ORDER — MORPHINE SULFATE 4 MG/ML IJ SOLN
4.0000 mg | Freq: Once | INTRAMUSCULAR | Status: AC
  Administered 2012-08-12: 4 mg via INTRAVENOUS
  Filled 2012-08-12: qty 1

## 2012-08-12 MED ORDER — SODIUM CHLORIDE 0.9 % IV BOLUS (SEPSIS)
1000.0000 mL | Freq: Once | INTRAVENOUS | Status: AC
  Administered 2012-08-12: 1000 mL via INTRAVENOUS

## 2012-08-12 NOTE — ED Provider Notes (Addendum)
History     CSN: [id number]  Arrival date & time 08/12/12  1909   First MD Initiated Contact with Patient 08/12/12 1929      Chief Complaint  Patient presents with  . Extremity Pain  . Diarrhea    (Consider location/radiation/quality/duration/timing/severity/associated sxs/prior treatment) Patient is a 57 y.o. female presenting with extremity pain. The history is provided by the patient.  Extremity Pain This is a chronic problem. Episode onset: months ago. The problem occurs constantly. The problem has been rapidly worsening. Pertinent negatives include no chest pain and no shortness of breath. Nothing aggravates the symptoms. Nothing relieves the symptoms. She has tried nothing for the symptoms.    Past Medical History  Diagnosis Date  . Hypertension   . Heart murmur   . Asthma   . Emphysema   . Diabetes mellitus   . Hyperlipidemia   . Allergic rhinitis   . Chronic headache   . OSA (obstructive sleep apnea)   . Neuropathy   . Grave's disease   . Herniated disc   . COPD (chronic obstructive pulmonary disease)   . Sleep apnea   . Menopause   . Hernia, umbilical   . Anxiety   . Depression   . Back pain, chronic   . Shingles   . Herniated disc   . Arthritis     rheumatoid  . 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 Generalized anxiety disorder 05/26/2012  . Chronic back pain 05/27/2012    Secondary to L4-5 herniated disc per patient    Past Surgical History  Procedure Laterality Date  . Cholecystectomy    . Tubal ligation    . Laparoscopy    . Foot surgery    . Eye surgery      Family History  Problem Relation Age of Onset  . Emphysema Mother   . Allergies Mother   . Allergies Daughter   . Asthma Mother   . Asthma Brother   . Heart disease Mother   . Rheum arthritis Mother   . Breast cancer Other     aunt  . Lung cancer Brother   . Throat cancer Brother     History  Substance Use  Topics  . Smoking status: Current Every Day Smoker -- 1.00 packs/day for 40 years    Types: Cigarettes  . Smokeless tobacco: Never Used  . Alcohol Use: No    OB History   Grav Para Term Preterm Abortions TAB SAB Ect Mult Living   5 3   2  2   3       Review of Systems  Respiratory: Negative for shortness of breath.   Cardiovascular: Negative for chest pain.  All other systems reviewed and are negative.    Allergies  Ciprofloxacin and Penicillins  Home Medications   Current Outpatient Rx  Name  Route  Sig  Dispense  Refill  . acyclovir (ZOVIRAX) 400 MG tablet   Oral   Take 800 mg by mouth every morning.          Marland Kitchen albuterol (PROAIR HFA) 108 (90 BASE) MCG/ACT inhaler   Inhalation   Inhale 2 puffs into the lungs every 6 (six) hours as needed. For COPD         . aspirin (ASPIRIN EC) 81 MG EC tablet   Oral   Take 1 tablet (81 mg total) by mouth daily. Swallow whole.   30 tablet  12   . beclomethasone (QVAR) 80 MCG/ACT inhaler   Inhalation   Inhale 2 puffs into the lungs every morning.          . busPIRone (BUSPAR) 10 MG tablet   Oral   Take 1 tablet (10 mg total) by mouth 3 (three) times daily.         . carisoprodol (SOMA) 350 MG tablet   Oral   Take 350 mg by mouth every morning.          . clotrimazole-betamethasone (LOTRISONE) cream   Topical   Apply 1 application topically 2 (two) times daily. For face         . FLUoxetine (PROZAC) 20 MG capsule   Oral   Take 60 mg by mouth every morning.          . gabapentin (NEURONTIN) 800 MG tablet   Oral   Take 800 mg by mouth 3 (three) times daily.         Marland Kitchen glipiZIDE (GLUCOTROL) 5 MG tablet   Oral   Take 1 tablet (5 mg total) by mouth daily before lunch.   30 tablet   0   . hydrochlorothiazide (HYDRODIURIL) 25 MG tablet   Oral   Take 25 mg by mouth every morning.          Marland Kitchen HYDROcodone-acetaminophen (NORCO) 10-325 MG per tablet   Oral   Take 1 tablet by mouth every 4 (four) hours as  needed for pain.   20 tablet   0   . ipratropium-albuterol (DUONEB) 0.5-2.5 (3) MG/3ML SOLN   Nebulization   Take 3 mLs by nebulization 4 (four) times daily - after meals and at bedtime. For COPD         . levETIRAcetam (KEPPRA) 500 MG tablet   Oral   Take 1 tablet (500 mg total) by mouth 2 (two) times daily.   60 tablet   3   . levothyroxine (SYNTHROID, LEVOTHROID) 175 MCG tablet   Oral   Take 175 mcg by mouth every morning.          . lidocaine (LIDODERM) 5 %   Transdermal   Place 1 patch onto the skin daily as needed. Remove & Discard patch within 12 hours or as directed by MD         . metFORMIN (GLUCOPHAGE) 1000 MG tablet   Oral   Take 1,000 mg by mouth 2 (two) times daily.          . mirtazapine (REMERON) 45 MG tablet   Oral   Take 45 mg by mouth at bedtime. For insomnia         . mupirocin ointment (BACTROBAN) 2 %   Topical   Apply topically 3 (three) times daily. Nares for 5 days   22 g   0   . pantoprazole (PROTONIX) 40 MG tablet   Oral   Take 40 mg by mouth daily.         . traMADol (ULTRAM) 50 MG tablet   Oral   Take 1 tablet (50 mg total) by mouth every 8 (eight) hours as needed for pain.   15 tablet   0   . verapamil (CALAN) 40 MG tablet   Oral   Take 1 tablet (40 mg total) by mouth 3 (three) times daily.   90 tablet   3     BP 138/78  Pulse 81  Temp(Src) 99.2 F (37.3 C) (Oral)  Resp 16  Ht 5'  9" (1.753 m)  Wt 230 lb (104.327 kg)  BMI 33.95 kg/m2  SpO2 97%  Physical Exam  Nursing note and vitals reviewed. Constitutional: She is oriented to person, place, and time. She appears well-developed and well-nourished. No distress.  HENT:  Head: Normocephalic and atraumatic.  Mouth/Throat: Oropharynx is clear and moist.  Neck: Normal range of motion. Neck supple.  Cardiovascular: Normal rate and regular rhythm.   Pulmonary/Chest: Effort normal and breath sounds normal. No respiratory distress. She has no wheezes.  Abdominal:  Soft. Bowel sounds are normal. She exhibits no distension. There is no tenderness.  Musculoskeletal: Normal range of motion. She exhibits no edema.  Neurological: She is alert and oriented to person, place, and time. No cranial nerve deficit. She exhibits normal muscle tone. Coordination normal.  There is ttp in the soft tissues of the lumbar region.  There is no bony ttp or stepoffs.  Skin: Skin is warm and dry. She is not diaphoretic.    ED Course  Procedures (including critical care time)  Labs Reviewed  GLUCOSE, CAPILLARY - Abnormal; Notable for the following:    Glucose-Capillary 169 (*)    All other components within normal limits  CBC WITH DIFFERENTIAL  COMPREHENSIVE METABOLIC PANEL  URINALYSIS, ROUTINE W REFLEX MICROSCOPIC  TROPONIN I   No results found.   No diagnosis found.   Date: 08/12/2012  Rate: 82  Rhythm: normal sinus rhythm  QRS Axis: normal  Intervals: normal  ST/T Wave abnormalities: normal  Conduction Disutrbances:none  Narrative Interpretation:   Old EKG Reviewed: unchanged    MDM  The patient presents here with multiple complaints including arm and leg numbness, diarhhea, pain in her back.  She is due to see pain management next month.  I have found nothing obvious in the workup to explain her symptoms.  She appears well and the workup is unremarkable and exam is non-focal.  She will be discharged with pain meds, to see her pcp to discuss in the next week.          Geoffery Lyons, MD 08/12/12 4098  Geoffery Lyons, MD 08/24/12 [phone]

## 2012-08-12 NOTE — ED Notes (Addendum)
Pt c/o worsening pain to arms, legs, shoulders. Also c/o increased BMs, pt states she recently started drinking herbal tea for pain. Pt is due to be seen at pain clinic in June, pt tearful in triage stating she is having numbness in hands, states she slept all day yesterday. PWD

## 2012-08-12 NOTE — ED Notes (Signed)
EKG old and new given to EDP, Delo, MD.

## 2012-08-12 NOTE — ED Notes (Signed)
MD at bedside. 

## 2012-08-15 LAB — GLUCOSE, CAPILLARY: Glucose-Capillary: 127 mg/dL — ABNORMAL HIGH (ref 70–99)

## 2012-09-27 ENCOUNTER — Encounter (HOSPITAL_COMMUNITY): Payer: Self-pay | Admitting: Emergency Medicine

## 2012-09-27 ENCOUNTER — Emergency Department (HOSPITAL_COMMUNITY)
Admission: EM | Admit: 2012-09-27 | Discharge: 2012-09-27 | Disposition: A | Discharge status: Home / Self Care | Payer: Medicaid Other | Attending: Emergency Medicine | Admitting: Emergency Medicine

## 2012-09-27 DIAGNOSIS — Z8639 Personal history of other endocrine, nutritional and metabolic disease: Secondary | ICD-10-CM | POA: Insufficient documentation

## 2012-09-27 DIAGNOSIS — M545 Low back pain, unspecified: Secondary | ICD-10-CM | POA: Insufficient documentation

## 2012-09-27 DIAGNOSIS — E0500 Thyrotoxicosis with diffuse goiter without thyrotoxic crisis or storm: Secondary | ICD-10-CM | POA: Insufficient documentation

## 2012-09-27 DIAGNOSIS — M549 Dorsalgia, unspecified: Secondary | ICD-10-CM

## 2012-09-27 DIAGNOSIS — I10 Essential (primary) hypertension: Secondary | ICD-10-CM | POA: Insufficient documentation

## 2012-09-27 DIAGNOSIS — F17200 Nicotine dependence, unspecified, uncomplicated: Secondary | ICD-10-CM | POA: Insufficient documentation

## 2012-09-27 DIAGNOSIS — J449 Chronic obstructive pulmonary disease, unspecified: Secondary | ICD-10-CM | POA: Insufficient documentation

## 2012-09-27 DIAGNOSIS — Z8739 Personal history of other diseases of the musculoskeletal system and connective tissue: Secondary | ICD-10-CM | POA: Insufficient documentation

## 2012-09-27 DIAGNOSIS — F411 Generalized anxiety disorder: Secondary | ICD-10-CM | POA: Insufficient documentation

## 2012-09-27 DIAGNOSIS — L299 Pruritus, unspecified: Secondary | ICD-10-CM | POA: Insufficient documentation

## 2012-09-27 DIAGNOSIS — G8929 Other chronic pain: Secondary | ICD-10-CM | POA: Insufficient documentation

## 2012-09-27 DIAGNOSIS — B029 Zoster without complications: Secondary | ICD-10-CM | POA: Insufficient documentation

## 2012-09-27 DIAGNOSIS — E119 Type 2 diabetes mellitus without complications: Secondary | ICD-10-CM | POA: Insufficient documentation

## 2012-09-27 DIAGNOSIS — Z79899 Other long term (current) drug therapy: Secondary | ICD-10-CM | POA: Insufficient documentation

## 2012-09-27 DIAGNOSIS — F3289 Other specified depressive episodes: Secondary | ICD-10-CM | POA: Insufficient documentation

## 2012-09-27 DIAGNOSIS — Z880 Allergy status to penicillin: Secondary | ICD-10-CM | POA: Insufficient documentation

## 2012-09-27 DIAGNOSIS — M069 Rheumatoid arthritis, unspecified: Secondary | ICD-10-CM | POA: Insufficient documentation

## 2012-09-27 DIAGNOSIS — J4489 Other specified chronic obstructive pulmonary disease: Secondary | ICD-10-CM | POA: Insufficient documentation

## 2012-09-27 DIAGNOSIS — R011 Cardiac murmur, unspecified: Secondary | ICD-10-CM | POA: Insufficient documentation

## 2012-09-27 DIAGNOSIS — Z8679 Personal history of other diseases of the circulatory system: Secondary | ICD-10-CM | POA: Insufficient documentation

## 2012-09-27 DIAGNOSIS — F329 Major depressive disorder, single episode, unspecified: Secondary | ICD-10-CM | POA: Insufficient documentation

## 2012-09-27 DIAGNOSIS — Z8742 Personal history of other diseases of the female genital tract: Secondary | ICD-10-CM | POA: Insufficient documentation

## 2012-09-27 DIAGNOSIS — Z8669 Personal history of other diseases of the nervous system and sense organs: Secondary | ICD-10-CM | POA: Insufficient documentation

## 2012-09-27 DIAGNOSIS — Z8719 Personal history of other diseases of the digestive system: Secondary | ICD-10-CM | POA: Insufficient documentation

## 2012-09-27 DIAGNOSIS — Z862 Personal history of diseases of the blood and blood-forming organs and certain disorders involving the immune mechanism: Secondary | ICD-10-CM | POA: Insufficient documentation

## 2012-09-27 DIAGNOSIS — G4733 Obstructive sleep apnea (adult) (pediatric): Secondary | ICD-10-CM | POA: Insufficient documentation

## 2012-09-27 MED ORDER — TRAMADOL HCL 50 MG PO TABS: 50.0000 mg | ORAL_TABLET | Freq: Four times a day (QID) | ORAL | Status: DC | PRN

## 2012-09-27 MED ORDER — DEXAMETHASONE SODIUM PHOSPHATE 10 MG/ML IJ SOLN
10.0000 mg | Freq: Once | INTRAMUSCULAR | Status: AC
  Administered 2012-09-27: 10 mg via INTRAMUSCULAR
  Filled 2012-09-27: qty 1

## 2012-09-27 MED ORDER — DIPHENHYDRAMINE HCL 25 MG PO CAPS
50.0000 mg | ORAL_CAPSULE | Freq: Once | ORAL | Status: AC
  Administered 2012-09-27: 50 mg via ORAL
  Filled 2012-09-27: qty 2

## 2012-09-27 MED ORDER — HYDROCODONE-ACETAMINOPHEN 5-325 MG PO TABS
2.0000 | ORAL_TABLET | Freq: Once | ORAL | Status: AC
  Administered 2012-09-27: 2 via ORAL
  Filled 2012-09-27: qty 2

## 2012-09-27 NOTE — ED Notes (Signed)
Pt c/o chronic low back pain.  Pt states she has been having this pain since 2001.  States that she was going to go to her PCP but her "medicaid hasn't switched over yet".

## 2012-09-27 NOTE — ED Notes (Signed)
Pt ambulatory to exam room with steady gait.  

## 2012-09-27 NOTE — ED Notes (Signed)
Pt states she has a ride home. 

## 2012-09-27 NOTE — ED Provider Notes (Signed)
Medical screening examination/treatment/procedure(s) were performed by non-physician practitioner and as supervising physician I was immediately available for consultation/collaboration.  Estela Vinal T Marrio Scribner, MD 09/27/12 2219 

## 2012-09-27 NOTE — ED Provider Notes (Signed)
History    This chart was scribed for non-physician practitioner Junious Silk, PA working with Toy Baker, MD by Quintella Reichert, ED Scribe. This patient was seen in room WTR9/WTR9 and the patient's care was started at 3:39 PM .   CSN: [id number]  Arrival date & time 09/27/12  1339    Chief Complaint  Patient presents with  . Back Pain    The history is provided by the patient. No language interpreter was used.     HPI Comments: Vanessa [name] is a 57 y.o. female with h/o chronic back pain, rheumatoid arthritis, DM and COPD who presents to the Emergency Department complaining of a flair-up of her chronic lower back pain.  Pain is described as constant and stabbing.  Pt has attempted to treat pain with Tylenol, without relief.  She notes that she went to her PCP's office today but could not obtain an appointment for 6 days. She states that she "couldn't wait" until then due to her back pain.  She notes that she currently does not have pain medications or muscle relaxants that have worked for her in the past, and she is requesting hydrocodone and Soma.  Pt notes that she has an appointment with a pain management specialist in 2 weeks.  She notes that her PCP recently attempted to treat her pain with cyclobenzaprine, but that this caused thrush.  She has also attempted to treat symptoms in the past with lidocaine patches, without relief.  Pt also reports severe itching to the soles of her feet which she attributes to possible poison ivy exposure.  She denies bladder or bowel incontinence, hx of CA, or IVDA.   Past Medical History  Diagnosis Date  . Hypertension   . Heart murmur   . Asthma   . Emphysema   . Diabetes mellitus   . Hyperlipidemia   . Allergic rhinitis   . Chronic headache   . OSA (obstructive sleep apnea)   . Neuropathy   . Grave's disease   . Herniated disc   . COPD (chronic obstructive pulmonary disease)   . Sleep apnea   . Menopause   . Hernia,  umbilical   . Anxiety   . Depression   . Back pain, chronic   . Shingles   . Herniated disc   . Arthritis     rheumatoid  . 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 Generalized anxiety disorder 05/26/2012  . Chronic back pain 05/27/2012    Secondary to L4-5 herniated disc per patient    Past Surgical History  Procedure Laterality Date  . Cholecystectomy    . Tubal ligation    . Laparoscopy    . Foot surgery    . Eye surgery      Family History  Problem Relation Age of Onset  . Emphysema Mother   . Allergies Mother   . Allergies Daughter   . Asthma Mother   . Asthma Brother   . Heart disease Mother   . Rheum arthritis Mother   . Breast cancer Other     aunt  . Lung cancer Brother   . Throat cancer Brother     History  Substance Use Topics  . Smoking status: Current Every Day Smoker -- 1.00 packs/day for 40 years    Types: Cigarettes  . Smokeless tobacco: Never Used  . Alcohol Use: No    OB History   Grav Para Term  Preterm Abortions TAB SAB Ect Mult Living   5 3   2  2   3        Review of Systems  Genitourinary:       No incontinence  Musculoskeletal: Positive for back pain.  Skin:       Itching on soles of feet  All other systems reviewed and are negative.      Allergies  Ciprofloxacin and Penicillins  Home Medications   Current Outpatient Rx  Name  Route  Sig  Dispense  Refill  . acetaminophen (TYLENOL) 500 MG tablet   Oral   Take 500 mg by mouth every 6 (six) hours as needed for pain.         Marland Kitchen acyclovir (ZOVIRAX) 400 MG tablet   Oral   Take 800 mg by mouth every morning.          Marland Kitchen albuterol (PROAIR HFA) 108 (90 BASE) MCG/ACT inhaler   Inhalation   Inhale 2 puffs into the lungs every 6 (six) hours as needed. For COPD         . beclomethasone (QVAR) 80 MCG/ACT inhaler   Inhalation   Inhale 2 puffs into the lungs every morning.          . busPIRone (BUSPAR) 10 MG  tablet   Oral   Take 10 mg by mouth 3 (three) times daily.         . carisoprodol (SOMA) 350 MG tablet   Oral   Take 350 mg by mouth every morning.          . clotrimazole-betamethasone (LOTRISONE) cream   Topical   Apply 1 application topically 2 (two) times daily. For face         . cyclobenzaprine (FLEXERIL) 5 MG tablet   Oral   Take 5 mg by mouth 3 (three) times daily as needed for muscle spasms.         Marland Kitchen FLUoxetine (PROZAC) 20 MG capsule   Oral   Take 60 mg by mouth every morning.          . gabapentin (NEURONTIN) 800 MG tablet   Oral   Take 800 mg by mouth 3 (three) times daily.         Marland Kitchen glipiZIDE (GLUCOTROL) 5 MG tablet   Oral   Take 5 mg by mouth daily before lunch.         . hydrochlorothiazide (HYDRODIURIL) 25 MG tablet   Oral   Take 25 mg by mouth every morning.          Marland Kitchen HYDROcodone-acetaminophen (NORCO) 10-325 MG per tablet   Oral   Take 1 tablet by mouth every 6 (six) hours as needed for pain.         Marland Kitchen ibuprofen (ADVIL,MOTRIN) 200 MG tablet   Oral   Take 800 mg by mouth every 6 (six) hours as needed for pain.         Marland Kitchen levETIRAcetam (KEPPRA) 500 MG tablet   Oral   Take 500 mg by mouth 2 (two) times daily.         Marland Kitchen levothyroxine (SYNTHROID, LEVOTHROID) 175 MCG tablet   Oral   Take 175 mcg by mouth every morning.          . lidocaine (LIDODERM) 5 %   Transdermal   Place 1 patch onto the skin daily as needed. Remove & Discard patch within 12 hours or as directed by MD         .  metFORMIN (GLUCOPHAGE) 1000 MG tablet   Oral   Take 1,000 mg by mouth 2 (two) times daily.          . mirtazapine (REMERON) 45 MG tablet   Oral   Take 45 mg by mouth at bedtime. For insomnia         . oxyCODONE-acetaminophen (PERCOCET) 5-325 MG per tablet   Oral   Take 2 tablets by mouth every 4 (four) hours as needed for pain.   15 tablet   0   . pantoprazole (PROTONIX) 40 MG tablet   Oral   Take 40 mg by mouth daily.          . pregabalin (LYRICA) 25 MG capsule   Oral   Take 25 mg by mouth 2 (two) times daily.         . traMADol (ULTRAM) 50 MG tablet   Oral   Take 50 mg by mouth every 6 (six) hours as needed for pain.         . verapamil (CALAN) 40 MG tablet   Oral   Take 40 mg by mouth 3 (three) times daily.          BP 140/87  Pulse 100  Temp(Src) 98.7 F (37.1 C) (Oral)  Resp 18  SpO2 100%  Physical Exam  Nursing note and vitals reviewed. Constitutional: She is oriented to person, place, and time. She appears well-developed and well-nourished. No distress.  HENT:  Head: Normocephalic and atraumatic.  Right Ear: External ear normal.  Left Ear: External ear normal.  Nose: Nose normal.  Mouth/Throat: Oropharynx is clear and moist.  Eyes: Conjunctivae are normal.  Neck: Normal range of motion.  Cardiovascular: Normal rate, regular rhythm and normal heart sounds.   Pulmonary/Chest: Effort normal and breath sounds normal. No stridor. No respiratory distress. She has no wheezes. She has no rales.  Abdominal: Soft. She exhibits no distension.  Musculoskeletal: Normal range of motion.  No bony tenderness, step offs, or deformities.  Neurological: She is alert and oriented to person, place, and time. She has normal strength.  Skin: Skin is warm and dry. She is not diaphoretic. No erythema.  Psychiatric: She has a normal mood and affect. Her behavior is normal.    ED Course  Procedures (including critical care time)  DIAGNOSTIC STUDIES: Oxygen Saturation is 100% on room air, normal by my interpretation.    COORDINATION OF CARE: 3:57 PM: Informed pt that narcotic medications cannot be prescribed for chronic pain in the ED. Discussed treatment plan which includes hydrocodone administration in ED and a 3-day prescription for tramadol, and Benadryl for itching.  Pt expressed understanding and agreed to plan.   Labs Reviewed - No data to display  No results found.  1. Chronic back pain      MDM  Patient with back pain.  This is chronic. No new injuries. No neurological deficits and normal neuro exam.  Patient can walk but states is painful.  No loss of bowel or bladder control.  No concern for cauda equina.  No fever, night sweats, weight loss, h/o cancer, IVDU. Pain treated in ED. Sent home with 3 day course of tramadol. She has an appointment with pain management.    I personally performed the services described in this documentation, which was scribed in my presence. The recorded information has been reviewed and is accurate.    Mora Bellman, PA-C 09/27/12 2059

## 2012-10-05 ENCOUNTER — Inpatient Hospital Stay (HOSPITAL_COMMUNITY): Payer: Medicaid Other

## 2012-10-05 ENCOUNTER — Emergency Department (HOSPITAL_COMMUNITY): Payer: Medicaid Other

## 2012-10-05 ENCOUNTER — Encounter (HOSPITAL_COMMUNITY): Payer: Self-pay

## 2012-10-05 ENCOUNTER — Inpatient Hospital Stay (HOSPITAL_COMMUNITY)
Admission: EM | Admit: 2012-10-05 | Discharge: 2012-10-06 | DRG: 066 | Disposition: A | Discharge status: Home / Self Care | Payer: Medicaid Other | Attending: Internal Medicine | Admitting: Internal Medicine

## 2012-10-05 DIAGNOSIS — E1142 Type 2 diabetes mellitus with diabetic polyneuropathy: Secondary | ICD-10-CM | POA: Diagnosis present

## 2012-10-05 DIAGNOSIS — F17200 Nicotine dependence, unspecified, uncomplicated: Secondary | ICD-10-CM | POA: Diagnosis present

## 2012-10-05 DIAGNOSIS — R262 Difficulty in walking, not elsewhere classified: Secondary | ICD-10-CM | POA: Diagnosis present

## 2012-10-05 DIAGNOSIS — I639 Cerebral infarction, unspecified: Secondary | ICD-10-CM

## 2012-10-05 DIAGNOSIS — R4789 Other speech disturbances: Secondary | ICD-10-CM | POA: Diagnosis present

## 2012-10-05 DIAGNOSIS — J45909 Unspecified asthma, uncomplicated: Secondary | ICD-10-CM | POA: Diagnosis present

## 2012-10-05 DIAGNOSIS — G894 Chronic pain syndrome: Secondary | ICD-10-CM | POA: Diagnosis present

## 2012-10-05 DIAGNOSIS — M5126 Other intervertebral disc displacement, lumbar region: Secondary | ICD-10-CM | POA: Diagnosis present

## 2012-10-05 DIAGNOSIS — G4733 Obstructive sleep apnea (adult) (pediatric): Secondary | ICD-10-CM

## 2012-10-05 DIAGNOSIS — J438 Other emphysema: Secondary | ICD-10-CM | POA: Diagnosis present

## 2012-10-05 DIAGNOSIS — R29810 Facial weakness: Secondary | ICD-10-CM | POA: Diagnosis present

## 2012-10-05 DIAGNOSIS — E890 Postprocedural hypothyroidism: Secondary | ICD-10-CM | POA: Diagnosis present

## 2012-10-05 DIAGNOSIS — E785 Hyperlipidemia, unspecified: Secondary | ICD-10-CM | POA: Diagnosis present

## 2012-10-05 DIAGNOSIS — F411 Generalized anxiety disorder: Secondary | ICD-10-CM

## 2012-10-05 DIAGNOSIS — M549 Dorsalgia, unspecified: Secondary | ICD-10-CM

## 2012-10-05 DIAGNOSIS — E119 Type 2 diabetes mellitus without complications: Secondary | ICD-10-CM

## 2012-10-05 DIAGNOSIS — I10 Essential (primary) hypertension: Secondary | ICD-10-CM | POA: Diagnosis present

## 2012-10-05 DIAGNOSIS — IMO0002 Reserved for concepts with insufficient information to code with codable children: Secondary | ICD-10-CM

## 2012-10-05 DIAGNOSIS — G8929 Other chronic pain: Secondary | ICD-10-CM

## 2012-10-05 DIAGNOSIS — R471 Dysarthria and anarthria: Secondary | ICD-10-CM

## 2012-10-05 DIAGNOSIS — I6350 Cerebral infarction due to unspecified occlusion or stenosis of unspecified cerebral artery: Principal | ICD-10-CM | POA: Diagnosis present

## 2012-10-05 DIAGNOSIS — E1149 Type 2 diabetes mellitus with other diabetic neurological complication: Secondary | ICD-10-CM | POA: Diagnosis present

## 2012-10-05 DIAGNOSIS — Z7982 Long term (current) use of aspirin: Secondary | ICD-10-CM

## 2012-10-05 DIAGNOSIS — F1210 Cannabis abuse, uncomplicated: Secondary | ICD-10-CM | POA: Diagnosis present

## 2012-10-05 DIAGNOSIS — Z880 Allergy status to penicillin: Secondary | ICD-10-CM

## 2012-10-05 DIAGNOSIS — R209 Unspecified disturbances of skin sensation: Secondary | ICD-10-CM | POA: Diagnosis present

## 2012-10-05 DIAGNOSIS — F3289 Other specified depressive episodes: Secondary | ICD-10-CM | POA: Diagnosis present

## 2012-10-05 DIAGNOSIS — F329 Major depressive disorder, single episode, unspecified: Secondary | ICD-10-CM | POA: Diagnosis present

## 2012-10-05 DIAGNOSIS — E039 Hypothyroidism, unspecified: Secondary | ICD-10-CM

## 2012-10-05 LAB — COMPREHENSIVE METABOLIC PANEL
ALT: 26 U/L (ref 0–35)
Alkaline Phosphatase: 57 U/L (ref 39–117)
CO2: 28 mEq/L (ref 19–32)
Calcium: 8.9 mg/dL (ref 8.4–10.5)
GFR calc Af Amer: 64 mL/min — ABNORMAL LOW (ref >90)
GFR calc non Af Amer: 55 mL/min — ABNORMAL LOW (ref >90)
Glucose, Bld: 134 mg/dL — ABNORMAL HIGH (ref 70–99)
Potassium: 3.6 mEq/L (ref 3.5–5.1)
Sodium: 136 mEq/L (ref 135–145)

## 2012-10-05 LAB — CBC
Hemoglobin: 12.6 g/dL (ref 12.0–15.0)
MCV: 86.6 fL (ref 78.0–100.0)
Platelets: 232 10*3/uL (ref 150–400)
RBC: 4.32 MIL/uL (ref 3.87–5.11)
WBC: 12.4 10*3/uL — ABNORMAL HIGH (ref 4.0–10.5)

## 2012-10-05 LAB — RAPID URINE DRUG SCREEN, HOSP PERFORMED
Amphetamines: NOT DETECTED
Barbiturates: NOT DETECTED
Tetrahydrocannabinol: POSITIVE — AB

## 2012-10-05 LAB — DIFFERENTIAL
Eosinophils Relative: 2 % (ref 0–5)
Lymphocytes Relative: 20 % (ref 12–46)
Lymphs Abs: 2.5 10*3/uL (ref 0.7–4.0)
Monocytes Relative: 5 % (ref 3–12)

## 2012-10-05 LAB — URINALYSIS, ROUTINE W REFLEX MICROSCOPIC
Nitrite: NEGATIVE
Protein, ur: NEGATIVE mg/dL
Urobilinogen, UA: 0.2 mg/dL (ref 0.0–1.0)

## 2012-10-05 LAB — GLUCOSE, CAPILLARY

## 2012-10-05 LAB — POCT I-STAT TROPONIN I: Troponin i, poc: 0 ng/mL (ref 0.00–0.08)

## 2012-10-05 LAB — PROTIME-INR
INR: 1.22 (ref 0.00–1.49)
Prothrombin Time: 15.1 seconds (ref 11.6–15.2)

## 2012-10-05 LAB — APTT: aPTT: 38 seconds — ABNORMAL HIGH (ref 24–37)

## 2012-10-05 LAB — URINE MICROSCOPIC-ADD ON

## 2012-10-05 MED ORDER — NICOTINE 21 MG/24HR TD PT24
21.0000 mg | MEDICATED_PATCH | Freq: Every day | TRANSDERMAL | Status: DC
  Administered 2012-10-05 – 2012-10-06 (×2): 21 mg via TRANSDERMAL
  Filled 2012-10-05 (×2): qty 1

## 2012-10-05 MED ORDER — SENNOSIDES-DOCUSATE SODIUM 8.6-50 MG PO TABS
1.0000 | ORAL_TABLET | Freq: Every day | ORAL | Status: DC
  Administered 2012-10-05: 1 via ORAL
  Filled 2012-10-05 (×2): qty 1

## 2012-10-05 MED ORDER — LEVOTHYROXINE SODIUM 175 MCG PO TABS: 175.0000 ug | ORAL_TABLET | Freq: Every morning | ORAL | Status: DC

## 2012-10-05 MED ORDER — PREGABALIN 50 MG PO CAPS
75.0000 mg | ORAL_CAPSULE | Freq: Three times a day (TID) | ORAL | Status: DC
  Administered 2012-10-05 – 2012-10-06 (×2): 75 mg via ORAL
  Filled 2012-10-05 (×2): qty 1

## 2012-10-05 MED ORDER — ASPIRIN 325 MG PO TABS
325.0000 mg | ORAL_TABLET | Freq: Every day | ORAL | Status: DC
  Administered 2012-10-06: 325 mg via ORAL
  Filled 2012-10-05: qty 1

## 2012-10-05 MED ORDER — MELOXICAM 15 MG PO TABS
15.0000 mg | ORAL_TABLET | Freq: Every day | ORAL | Status: DC
  Administered 2012-10-06: 15 mg via ORAL
  Filled 2012-10-05: qty 1

## 2012-10-05 MED ORDER — INSULIN ASPART 100 UNIT/ML ~~LOC~~ SOLN
0.0000 [IU] | Freq: Three times a day (TID) | SUBCUTANEOUS | Status: DC
  Administered 2012-10-06: 1 [IU] via SUBCUTANEOUS

## 2012-10-05 MED ORDER — DEXTROSE 50 % IV SOLN
1.0000 | Freq: Once | INTRAVENOUS | Status: AC
  Administered 2012-10-05: 50 mL via INTRAVENOUS
  Filled 2012-10-05: qty 50

## 2012-10-05 MED ORDER — ALBUTEROL SULFATE HFA 108 (90 BASE) MCG/ACT IN AERS
2.0000 | INHALATION_SPRAY | Freq: Four times a day (QID) | RESPIRATORY_TRACT | Status: DC
  Administered 2012-10-06: 2 via RESPIRATORY_TRACT
  Filled 2012-10-05 (×2): qty 6.7

## 2012-10-05 MED ORDER — FLUTICASONE PROPIONATE HFA 44 MCG/ACT IN AERO
2.0000 | INHALATION_SPRAY | Freq: Two times a day (BID) | RESPIRATORY_TRACT | Status: DC
  Administered 2012-10-06: 2 via RESPIRATORY_TRACT
  Filled 2012-10-05: qty 10.6

## 2012-10-05 MED ORDER — ENOXAPARIN SODIUM 40 MG/0.4ML ~~LOC~~ SOLN
40.0000 mg | SUBCUTANEOUS | Status: DC
Start: 2012-10-05 — End: 2012-10-06
  Administered 2012-10-05: 40 mg via SUBCUTANEOUS
  Filled 2012-10-05 (×2): qty 0.4

## 2012-10-05 MED ORDER — FLUOXETINE HCL 20 MG PO CAPS
60.0000 mg | ORAL_CAPSULE | Freq: Every morning | ORAL | Status: DC
  Administered 2012-10-06: 60 mg via ORAL
  Filled 2012-10-05: qty 3

## 2012-10-05 MED ORDER — LEVOTHYROXINE SODIUM 175 MCG PO TABS
175.0000 ug | ORAL_TABLET | Freq: Every day | ORAL | Status: DC
  Administered 2012-10-06: 175 ug via ORAL
  Filled 2012-10-05 (×2): qty 1

## 2012-10-05 MED ORDER — ASPIRIN 300 MG RE SUPP
300.0000 mg | Freq: Every day | RECTAL | Status: DC
  Filled 2012-10-05: qty 1

## 2012-10-05 NOTE — H&P (Signed)
Date: 10/05/2012               Patient Name:  Vanessa [name] MRN: [id number]  DOB: Oct 25, 1955 Age / Sex: 56 y.o., female   PCP: Dois Davenport, MD         Medical Service: Internal Medicine Teaching Service         Attending Physician: Dr. Jonah Blue, DO    First Contact: Dr. Mikey Bussing Pager: [phone]  Second Contact: Dr. Everardo Beals Pager: [phone]       After Hours (After 5p/  First Contact Pager: [phone]  weekends / holidays): Second Contact Pager: [phone]   Chief Complaint: L facial droop, L arm weakness/numbness  History of Present Illness: Vanessa [name] is a 57 year old AA female with an extensive PMH notable for DMT2 with neuropathy, HTN, COPD, Hypothyroidism (Graves disease s/p radioablation), chronic back pain, sleep apnea, anxiety and depression.  She presents to [facility] this afternoon after she became concerned about L facial droop, and some left arm weakness and numbness.  The patient reports that yesterday she had a fight with her daughters and became very agitated and anxious.  This also caused her chronic back pain to become worse.  She reports she took her Vicodin and muscle relaxer (flexeril) but not remeron and tried to go to bed.  She also notes that she has had increased swelling in her hands and feet recently.  She reports that she woke up feeling with some left arm numbness at 4am.  She figured she slept on her arm wrong but the numbness did not immediately go away.  She "Facetimed" her sister who told her that she had some left facial droop.  She decided not to go to the hospital at this time.  She continued to try to sleep until around 9am, when she called her sister and daughter.  She says that she thinks they told her that her speech was slurred.  She still did not decide to go to the ED and reports that she decided to clean the bathroom.  She continued to experience some numbness while she used her left arm to clean the shower.  At some point the patient decided to  come to [facility] to be evaluated.     Meds: Current Facility-Administered Medications  Medication Dose Route Frequency Provider Last Rate Last Dose  . albuterol (PROVENTIL HFA;VENTOLIN HFA) 108 (90 BASE) MCG/ACT inhaler 2 puff  2 puff Inhalation QID Judie Bonus, MD      . aspirin suppository 300 mg  300 mg Rectal Daily Judie Bonus, MD       Or  . aspirin tablet 325 mg  325 mg Oral Daily Judie Bonus, MD      . enoxaparin (LOVENOX) injection 40 mg  40 mg Subcutaneous Q24H Judie Bonus, MD      . Melene Muller ON 10/06/2012] FLUoxetine (PROZAC) capsule 60 mg  60 mg Oral q morning - 10a Judie Bonus, MD      . fluticasone (FLOVENT HFA) 44 MCG/ACT inhaler 2 puff  2 puff Inhalation BID Judie Bonus, MD      . Melene Muller ON 10/06/2012] insulin aspart (novoLOG) injection 0-9 Units  0-9 Units Subcutaneous TID WC Judie Bonus, MD      . Melene Muller ON 10/06/2012] levothyroxine (SYNTHROID, LEVOTHROID) tablet 175 mcg  175 mcg Oral QAC breakfast Hurman Horn, MD      . Melene Muller ON 10/06/2012] meloxicam (MOBIC) tablet 15 mg  15 mg Oral Daily Judie Bonus, MD      . nicotine (NICODERM CQ - dosed in mg/24 hours) patch 21 mg  21 mg Transdermal Daily Judie Bonus, MD      . pregabalin (LYRICA) capsule 75 mg  75 mg Oral TID Judie Bonus, MD      . senna-docusate (Senokot-S) tablet 1 tablet  1 tablet Oral QHS Judie Bonus, MD       Current Outpatient Prescriptions  Medication Sig Dispense Refill  . acyclovir (ZOVIRAX) 400 MG tablet Take 800 mg by mouth every morning.       Marland Kitchen albuterol (PROAIR HFA) 108 (90 BASE) MCG/ACT inhaler Inhale 2 puffs into the lungs 4 (four) times daily. For COPD      . beclomethasone (QVAR) 80 MCG/ACT inhaler Inhale 2 puffs into the lungs 2 (two) times daily.       . busPIRone (BUSPAR) 10 MG tablet Take 20 mg by mouth 3 (three) times daily.       . clotrimazole-betamethasone (LOTRISONE) cream Apply 1 application topically 2 (two) times daily  as needed. For face      . dexlansoprazole (DEXILANT) 60 MG capsule Take 60 mg by mouth daily.      Marland Kitchen FLUoxetine (PROZAC) 20 MG capsule Take 60 mg by mouth every morning.       Marland Kitchen glipiZIDE (GLUCOTROL XL) 10 MG 24 hr tablet Take 10 mg by mouth 2 (two) times daily.      . hydrochlorothiazide (HYDRODIURIL) 25 MG tablet Take 25 mg by mouth every morning.       Marland Kitchen HYDROcodone-acetaminophen (NORCO) 10-325 MG per tablet Take 1 tablet by mouth every 6 (six) hours as needed for pain.      Marland Kitchen ipratropium-albuterol (DUONEB) 0.5-2.5 (3) MG/3ML SOLN Take 3 mLs by nebulization every 6 (six) hours as needed (Asthma).      . levETIRAcetam (KEPPRA) 500 MG tablet Take 500 mg by mouth 2 (two) times daily.      Marland Kitchen levothyroxine (SYNTHROID, LEVOTHROID) 175 MCG tablet Take 175 mcg by mouth every morning.       . lidocaine (LIDODERM) 5 % Place 1 patch onto the skin daily as needed. Remove & Discard patch within 12 hours or as directed by MD      . meloxicam (MOBIC) 15 MG tablet Take 15 mg by mouth daily.      . metFORMIN (GLUCOPHAGE) 1000 MG tablet Take 1,000 mg by mouth 2 (two) times daily.       . mirtazapine (REMERON) 45 MG tablet Take 45 mg by mouth at bedtime as needed (Sleep). For insomnia      . OVER THE COUNTER MEDICATION Apply 1-2 application topically daily as needed (Diabetic neuropathy). Diabetic neuropathy cream      . pregabalin (LYRICA) 25 MG capsule Take 75 mg by mouth 3 (three) times daily.       Marland Kitchen tiZANidine (ZANAFLEX) 4 MG tablet Take 4 mg by mouth daily.      . traMADol (ULTRAM) 50 MG tablet Take 50 mg by mouth every 6 (six) hours as needed for pain.      . verapamil (CALAN) 40 MG tablet Take 40 mg by mouth 3 (three) times daily.        Allergies: Allergies as of 10/05/2012 - Review Complete 10/05/2012  Allergen Reaction Noted  . Ciprofloxacin Hives 03/18/2011  . Penicillins Hives 03/18/2011   Past Medical History  Diagnosis Date  . Hypertension   .  Heart murmur   . Asthma   . Emphysema   .  Diabetes mellitus   . Hyperlipidemia   . Allergic rhinitis   . Chronic headache   . OSA (obstructive sleep apnea)   . Neuropathy   . Grave's disease   . Herniated disc   . COPD (chronic obstructive pulmonary disease)   . Sleep apnea   . Menopause   . Hernia, umbilical   . Anxiety   . Depression   . Back pain, chronic   . Shingles   . Herniated disc   . Arthritis     rheumatoid  . 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 Generalized anxiety disorder 05/26/2012  . Chronic back pain 05/27/2012    Secondary to L4-5 herniated disc per patient   Past Surgical History  Procedure Laterality Date  . Cholecystectomy    . Tubal ligation    . Laparoscopy    . Foot surgery    . Eye surgery     Family History  Problem Relation Age of Onset  . Emphysema Mother   . Allergies Mother   . Allergies Daughter   . Asthma Mother   . Asthma Brother   . Heart disease Mother   . Rheum arthritis Mother   . Breast cancer Other     aunt  . Lung cancer Brother   . Throat cancer Brother    History   Social History  . Marital Status: Single    Spouse Name: N/A    Number of Children: 6  . Years of Education: N/A   Occupational History  . disabled. foster parent    Social History Main Topics  . Smoking status: Current Every Day Smoker -- 1.00 packs/day for 40 years    Types: Cigarettes  . Smokeless tobacco: Never Used  . Alcohol Use: No  . Drug Use: No  . Sexually Active: No   Other Topics Concern  . Not on file   Social History Narrative   Pt is separated and lives alone.          Review of Systems: Review of Systems  Constitutional: Positive for weight loss (reports loseing 3 pounds every time she sees her Dr.). Negative for fever and malaise/fatigue.  HENT: Negative for hearing loss and tinnitus.   Eyes: Negative for blurred vision and pain.  Respiratory: Negative for cough and shortness of breath.     Cardiovascular: Positive for leg swelling. Negative for chest pain and palpitations.  Gastrointestinal: Negative for nausea, vomiting, abdominal pain and diarrhea.  Genitourinary: Negative for dysuria.  Musculoskeletal: Positive for back pain. Negative for falls.  Neurological: Positive for sensory change, speech change, focal weakness and headaches. Negative for dizziness, tingling, seizures and loss of consciousness.  Endo/Heme/Allergies: Does not bruise/bleed easily.  Psychiatric/Behavioral: Positive for depression and substance abuse. The patient is nervous/anxious and has insomnia.      Physical Exam: Blood pressure 121/69, pulse 77, temperature 98.9 F (37.2 C), temperature source Oral, resp. rate 11, height 5\' 9"  (1.753 m), weight 220 lb (99.791 kg), SpO2 93.00%. General: resting in bed, NAD HEENT: PERRL, EOMI, no scleral icterus, exopthamos Cardiac: RRR, no rubs, murmurs or gallops Pulm: clear to auscultation bilaterally, moving normal volumes of air Abd: soft, nontender, nondistended, BS normoactive in all four quadrants Ext: warm and well perfused, no pedal edema Neuro:  II: No visual field defects appreciated III, IV, VI: EOMI V: Patient  reports decreased sensation to touch in V1,V2, V3 on the left side. VII: Able to smile, wrinkle forehead, close eyes VIII: No hearing loss IX, X, XII: No uvula deviation, no deviation of tongue, able to swallow. XI: Able to shrug shoulders. 5/5 muscle strength in flexion/extension/internal and external rotation/ abduction and adduction of shoulder b/l.  Grip strength intact b/l.  Hip and leg flexion/extention 5/5 b/l.  Dorsiflexion and plantarflexion 5/5 b/l.  Lab results: Basic Metabolic Panel:  Recent Labs  16/10/96 1349  NA 136  K 3.6  CL 98  CO2 28  GLUCOSE 134*  BUN 13  CREATININE 1.11*  CALCIUM 8.9   Liver Function Tests:  Recent Labs  10/05/12 1349  AST 22  ALT 26  ALKPHOS 57  BILITOT 0.1*  PROT 6.9  ALBUMIN  3.9   CBC:  Recent Labs  10/05/12 1349  WBC 12.4*  NEUTROABS 9.0*  HGB 12.6  HCT 37.4  MCV 86.6  PLT 232   Cardiac Enzymes:  Recent Labs  10/05/12 1349  TROPONINI <0.30   CBG:  Recent Labs  10/05/12 1233  GLUCAP 65*   Coagulation:  Recent Labs  10/05/12 1349  LABPROT 15.1  INR 1.22   Urine Drug Screen: Drugs of Abuse     Component Value Date/Time   LABOPIA POSITIVE* 10/05/2012 1324   COCAINSCRNUR NONE DETECTED 10/05/2012 1324   LABBENZ NONE DETECTED 10/05/2012 1324   AMPHETMU NONE DETECTED 10/05/2012 1324   THCU POSITIVE* 10/05/2012 1324   LABBARB NONE DETECTED 10/05/2012 1324    Alcohol Level:  Recent Labs  10/05/12 1348  ETH <11   Urinalysis:  Recent Labs  10/05/12 1324  COLORURINE YELLOW  LABSPEC 1.013  PHURINE 5.5  GLUCOSEU NEGATIVE  HGBUR NEGATIVE  BILIRUBINUR NEGATIVE  KETONESUR NEGATIVE  PROTEINUR NEGATIVE  UROBILINOGEN 0.2  NITRITE NEGATIVE  LEUKOCYTESUR TRACE*    Imaging results:  Dg Chest 2 View  10/05/2012   *RADIOLOGY REPORT*  Clinical Data: Stroke, left-sided pain.  CHEST - 2 VIEW  Comparison: 06/26/2012  Findings: Heart and mediastinal contours are within normal limits. No focal opacities or effusions.  No acute bony abnormality.  IMPRESSION: No active cardiopulmonary disease.   Original Report Authenticated By: Charlett Nose, M.D.   Ct Head (brain) Wo Contrast  10/05/2012   *RADIOLOGY REPORT*  Clinical Data:  Fall, with left arm numbness and slurred speech. Mild headache.  CT HEAD WITHOUT CONTRAST  Technique:  Contiguous axial images were obtained from the base of the skull through the vertex without contrast  Comparison:  06/26/2012.  Findings:  The brain has a normal appearance without evidence for hemorrhage, acute infarction, hydrocephalus, or mass lesion.  There is no extra axial fluid collection.  The skull and paranasal sinuses are normal. There are chronic deformities of the orbits with suspected old medial right orbit and inferior  left orbit blowout fractures and proptosis, stable.  IMPRESSION:       No acute or focal intracranial abnormality. 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   Original Report Authenticated By: Davonna Belling, M.D.    Other results: EKG: NSR, rate 90, Normal axis, no ST changes.  Assessment & Plan by Problem:   Dysarthria/Weakness/Sensory Changes. -Patient presented with symptoms concerning for CVA.  Patient's last known normal was last night at midnight 12 hours before presentation at ED- not candidate for tPA.  Her symptoms have mostly resolved she still reports some numbness in her left hand and left side of her face.  Will  obtain MRI head to look for CVA.  Our differential diagnosis includes TIA, Hypoglycemic state, since patient is a known diabetic and blood sugars were 65 on presentation, intoxication, however urine drug screen was only positive for marijuana, and patient does not have an anion gap.  Other diagnosis to consider after initial workup include polypharmacy and psychosocial issues. -MRI Head  -Monitor on telemetry -ASA for secondary stroke prevention -Order A1C, TSH, Lipid panel.  Diabetes mellitus -Hold home oral medications -Start SSI-senstitive. -A1C   OSA (obstructive sleep apnea)  -Continue CPAP   Hypothyroidism -Continue home medication, check TSH.   Generalized anxiety disorder/Depression -Continue home medications Chronic back pain -Patient is not limited by straight leg raise, does not produce pain.  Patient  COPD -Continue pharmacologic equivalent -Monitor respiratory status and add breathing treatments if needed. HTN -Currently normotensive, hold home medications will allow for permissive hypertension -Monitor HLD  -Lipid panel Dispo: Disposition is deferred at this time, awaiting improvement of current medical problems. Anticipated discharge in approximately 1-2 day(s).   The patient does have a current PCP Dois Davenport, MD) and does  not need an [facility] hospital follow-up appointment after discharge.  The patient does not have transportation limitations that hinder transportation to clinic appointments.  Signed: Carlynn Purl, DO 10/05/2012, 6:05 PM

## 2012-10-05 NOTE — ED Notes (Signed)
PT reports she feels drunk; denies drinking.

## 2012-10-05 NOTE — ED Notes (Signed)
Patient transported to CT 

## 2012-10-05 NOTE — ED Notes (Signed)
Last seen normal: 10/06/11 midnight.

## 2012-10-05 NOTE — H&P (Signed)
  I have seen and examined the patient myself, and I have reviewed the note by Anabel Halon, MS 3 and was present during the interview and physical exam.  Please see my separate H&P for additional findings, assessment, and plan.   Signed: Carlynn Purl, DO 10/05/2012, 7:34 PM

## 2012-10-05 NOTE — ED Provider Notes (Signed)
History    CSN: [id number] Arrival date & time 10/05/12  1157  First MD Initiated Contact with Patient 10/05/12 1242     Chief Complaint  Patient presents with  . Numbness   (Consider location/radiation/quality/duration/timing/severity/associated sxs/prior Treatment) HPI This 57 year old female at baseline has diabetes, peripheral neuropathy with numbness and tingling to both hands and both feet, COPD, was last known well at midnight last night but woke up at 4:00 this morning with left face arm and leg weakness and numbness which is mild but persistent difficulty walking and difficulty talking with dysarthria mildly slurred speech but no headache no altered mental status no fever no cough chest pain shortness breath or abdominal pain, she is baseline chronic severe low back pain unchanged today,  There is no treatment prior to arrival. She is not a code stroke candidate since she was last known well over 12 hours ago. Past Medical History  Diagnosis Date  . Hypertension   . Heart murmur   . Asthma   . Emphysema   . Diabetes mellitus   . Hyperlipidemia   . Allergic rhinitis   . Chronic headache   . OSA (obstructive sleep apnea)   . Neuropathy   . Grave's disease   . Herniated disc   . COPD (chronic obstructive pulmonary disease)   . Sleep apnea   . Menopause   . Hernia, umbilical   . Anxiety   . Depression   . Back pain, chronic   . Shingles   . Herniated disc   . Arthritis     rheumatoid  . 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 Generalized anxiety disorder 05/26/2012  . Chronic back pain 05/27/2012    Secondary to L4-5 herniated disc per patient   Past Surgical History  Procedure Laterality Date  . Cholecystectomy    . Tubal ligation    . Laparoscopy    . Foot surgery    . Eye surgery     Family History  Problem Relation Age of Onset  . Emphysema Mother   . Allergies Mother   . Allergies Daughter    . Asthma Mother   . Asthma Brother   . Heart disease Mother   . Rheum arthritis Mother   . Breast cancer Other     aunt  . Lung cancer Brother   . Throat cancer Brother    History  Substance Use Topics  . Smoking status: Current Every Day Smoker -- 1.00 packs/day for 40 years    Types: Cigarettes  . Smokeless tobacco: Never Used  . Alcohol Use: No   OB History   Grav Para Term Preterm Abortions TAB SAB Ect Mult Living   5 3   2  2   3      Review of Systems 10 Systems reviewed and are negative for acute change except as noted in the HPI. Allergies  Ciprofloxacin and Penicillins  Home Medications   Current Outpatient Rx  Name  Route  Sig  Dispense  Refill  . acyclovir (ZOVIRAX) 400 MG tablet   Oral   Take 800 mg by mouth every morning.          Marland Kitchen albuterol (PROAIR HFA) 108 (90 BASE) MCG/ACT inhaler   Inhalation   Inhale 2 puffs into the lungs 4 (four) times daily. For COPD         . beclomethasone (QVAR) 80 MCG/ACT inhaler   Inhalation  Inhale 2 puffs into the lungs 2 (two) times daily.          . busPIRone (BUSPAR) 10 MG tablet   Oral   Take 20 mg by mouth 3 (three) times daily.          Marland Kitchen dexlansoprazole (DEXILANT) 60 MG capsule   Oral   Take 60 mg by mouth daily.         Marland Kitchen FLUoxetine (PROZAC) 20 MG capsule   Oral   Take 60 mg by mouth every morning.          Marland Kitchen glipiZIDE (GLUCOTROL XL) 10 MG 24 hr tablet   Oral   Take 10 mg by mouth 2 (two) times daily.         . hydrochlorothiazide (HYDRODIURIL) 25 MG tablet   Oral   Take 25 mg by mouth every morning.          Marland Kitchen ipratropium-albuterol (DUONEB) 0.5-2.5 (3) MG/3ML SOLN   Nebulization   Take 3 mLs by nebulization every 6 (six) hours as needed (Asthma).         . levETIRAcetam (KEPPRA) 500 MG tablet   Oral   Take 500 mg by mouth 2 (two) times daily.         Marland Kitchen levothyroxine (SYNTHROID, LEVOTHROID) 175 MCG tablet   Oral   Take 175 mcg by mouth every morning.          .  meloxicam (MOBIC) 15 MG tablet   Oral   Take 15 mg by mouth daily.         . metFORMIN (GLUCOPHAGE) 1000 MG tablet   Oral   Take 1,000 mg by mouth 2 (two) times daily.          . mirtazapine (REMERON) 45 MG tablet   Oral   Take 45 mg by mouth at bedtime as needed (Sleep). For insomnia         . pregabalin (LYRICA) 25 MG capsule   Oral   Take 75 mg by mouth 3 (three) times daily.          . traMADol (ULTRAM) 50 MG tablet   Oral   Take 50 mg by mouth every 6 (six) hours as needed for pain.         . verapamil (CALAN) 40 MG tablet   Oral   Take 40 mg by mouth 3 (three) times daily.         Marland Kitchen aspirin EC 81 MG tablet   Oral   Take 1 tablet (81 mg total) by mouth daily.   30 tablet   0   . atorvastatin (LIPITOR) 40 MG tablet   Oral   Take 1 tablet (40 mg total) by mouth daily.   30 tablet   0    BP 133/84  Pulse 90  Temp(Src) 99.2 F (37.3 C) (Oral)  Resp 19  Ht 5\' 9"  (1.753 m)  Wt 221 lb 9.6 oz (100.517 kg)  BMI 32.71 kg/m2  SpO2 97% Physical Exam  Nursing note and vitals reviewed. Constitutional:  Awake, alert, nontoxic appearance with baseline speech for patient.  HENT:  Head: Atraumatic.  Mouth/Throat: No oropharyngeal exudate.  Mild facial asymmetry with mild left lower facial droop  Eyes: EOM are normal. Pupils are equal, round, and reactive to light. Right eye exhibits no discharge. Left eye exhibits no discharge.  Neck: Neck supple.  Cardiovascular: Normal rate and regular rhythm.   No murmur heard. Pulmonary/Chest: Effort normal and breath sounds  normal. No stridor. No respiratory distress. She has no wheezes. She has no rales. She exhibits no tenderness.  Abdominal: Soft. Bowel sounds are normal. She exhibits no mass. There is no tenderness. There is no rebound.  Musculoskeletal: She exhibits no tenderness.  Baseline ROM, moves extremities spontaneously and to command.  Lymphadenopathy:    She has no cervical adenopathy.  Neurological:  She is alert.  Awake, alert, cooperative and aware of situation; motor strength 5/5 right and 4/5 left; sensation normal to light touch right side decreased LT left face/arm/leg baseline neuropathy decreased light touch both hands both feet; peripheral visual fields full to confrontation;  mild left lower facial droop with decreased light touch left face facial asymmetry; tongue midline; major cranial nerves otherwise appear intact; mild left leg and minimal left arm pronator drift, normal finger to nose bilaterally but more difficult using left arm due to weakness  Skin: No rash noted.  Psychiatric: She has a normal mood and affect.    ED Course  Procedures (including critical care time) ECG: Sinus rhythm, ventricular rate 90, normal axis, nonspecific T wave abnormality, borderline prolonged QT interval, no significant change noted compared with May 2014 Patient / Family / Caregiver informed of clinical course, understand medical decision-making process, and agree with plan. D/w teach service for unassigned admit will see Pt in ED. 1455 Labs Reviewed  APTT - Abnormal; Notable for the following:    aPTT 38 (*)    All other components within normal limits  CBC - Abnormal; Notable for the following:    WBC 12.4 (*)    All other components within normal limits  DIFFERENTIAL - Abnormal; Notable for the following:    Neutro Abs 9.0 (*)    All other components within normal limits  COMPREHENSIVE METABOLIC PANEL - Abnormal; Notable for the following:    Glucose, Bld 134 (*)    Creatinine, Ser 1.11 (*)    Total Bilirubin 0.1 (*)    GFR calc non Af Amer 55 (*)    GFR calc Af Amer 64 (*)    All other components within normal limits  GLUCOSE, CAPILLARY - Abnormal; Notable for the following:    Glucose-Capillary 65 (*)    All other components within normal limits  URINE RAPID DRUG SCREEN (HOSP PERFORMED) - Abnormal; Notable for the following:    Opiates POSITIVE (*)    Tetrahydrocannabinol  POSITIVE (*)    All other components within normal limits  URINALYSIS, ROUTINE W REFLEX MICROSCOPIC - Abnormal; Notable for the following:    APPearance CLOUDY (*)    Leukocytes, UA TRACE (*)    All other components within normal limits  URINE MICROSCOPIC-ADD ON - Abnormal; Notable for the following:    Squamous Epithelial / LPF FEW (*)    Bacteria, UA FEW (*)    All other components within normal limits  HEMOGLOBIN A1C - Abnormal; Notable for the following:    Hemoglobin A1C 5.9 (*)    Mean Plasma Glucose 123 (*)    All other components within normal limits  BASIC METABOLIC PANEL - Abnormal; Notable for the following:    CO2 33 (*)    GFR calc non Af Amer 61 (*)    GFR calc Af Amer 71 (*)    All other components within normal limits  LIPID PANEL - Abnormal; Notable for the following:    Cholesterol 207 (*)    HDL 37 (*)    LDL Cholesterol 142 (*)    All  other components within normal limits  TSH - Abnormal; Notable for the following:    TSH 5.101 (*)    All other components within normal limits  GLUCOSE, CAPILLARY - Abnormal; Notable for the following:    Glucose-Capillary 139 (*)    All other components within normal limits  URINE CULTURE  PROTIME-INR  TROPONIN I  ETHANOL  CBC  GLUCOSE, CAPILLARY  GLUCOSE, CAPILLARY  POCT I-STAT TROPONIN I   No results found. 1. Stroke   2. Chronic back pain   3. Diabetes mellitus   4. Dysarthria   5. Generalized anxiety disorder   6. Hypothyroidism   7. OSA (obstructive sleep apnea)     MDM  The patient appears reasonably stabilized for admission considering the current resources, flow, and capabilities available in the ED at this time, and I doubt any other [facility] requiring further screening and/or treatment in the ED prior to admission.  Hurman Horn, MD 10/08/12 2018

## 2012-10-05 NOTE — H&P (Signed)
Date: 10/05/2012               Patient Name:  Vanessa [name] MRN: [id number]  DOB: 1956/01/28 Age / Sex: 57 y.o., female   PCP: Dois Davenport, MD              Medical Service: Internal Medicine Teaching Service              Attending Physician: Dr. Hurman Horn, MD    First Contact: Dr. Mikey Bussing Pager: [phone]  Second Contact: Dr. Everardo Beals Pager: [phone]            After Hours (After 5p/  First Contact Pager: [phone]  weekends / holidays): Second Contact Pager: [phone]   Chief Complaint: left arm numbness and slurred speech   History of Present Illness: 57yo black female smoker with a past medical history significant for HTN, DM, and OSA presented to ED with left arm numbness and slurred speech. Symptoms began at 0400 when pt awoke experiencing slurred speech, a "droopy" and swollen left face, and pain and numbness in her left arm. Pt called her sister during the onset of these symptoms. In the morning, pt noticed continuation of her previous symptoms, cramps and muscle twitches in her left leg, and right sided headache.Pt soaked her left arm in epsom salt and rubbed menthol/eucalyptus on it for . Pt reports recently high BP values and chronic lower back pain. LSN by son at midnight 10/04/12. Tobacco use of 1pack/day. Recent medication changes include tizanidine 4 mg daily (not taken 10/05/12), HCTZ increased from 5 to 25 mg; levothyroxine dose decreased from 175 to 120. Patient also reports occasional alcohol and THC use.     Meds: Current Facility-Administered Medications  Medication Dose Route Frequency Provider Last Rate Last Dose  . albuterol (PROVENTIL HFA;VENTOLIN HFA) 108 (90 BASE) MCG/ACT inhaler 2 puff  2 puff Inhalation QID Judie Bonus, MD      . aspirin suppository 300 mg  300 mg Rectal Daily Judie Bonus, MD       Or  . aspirin tablet 325 mg  325 mg Oral Daily Judie Bonus, MD      . enoxaparin (LOVENOX) injection 40 mg  40 mg Subcutaneous Q24H  Judie Bonus, MD      . Melene Muller ON 10/06/2012] FLUoxetine (PROZAC) capsule 60 mg  60 mg Oral q morning - 10a Judie Bonus, MD      . fluticasone (FLOVENT HFA) 44 MCG/ACT inhaler 2 puff  2 puff Inhalation BID Judie Bonus, MD      . Melene Muller ON 10/06/2012] insulin aspart (novoLOG) injection 0-9 Units  0-9 Units Subcutaneous TID WC Judie Bonus, MD      . Melene Muller ON 10/06/2012] levothyroxine (SYNTHROID, LEVOTHROID) tablet 175 mcg  175 mcg Oral QAC breakfast Hurman Horn, MD      . Melene Muller ON 10/06/2012] meloxicam (MOBIC) tablet 15 mg  15 mg Oral Daily Judie Bonus, MD      . nicotine (NICODERM CQ - dosed in mg/24 hours) patch 21 mg  21 mg Transdermal Daily Judie Bonus, MD      . senna-docusate (Senokot-S) tablet 1 tablet  1 tablet Oral QHS Judie Bonus, MD       Current Outpatient Prescriptions  Medication Sig Dispense Refill  . acyclovir (ZOVIRAX) 400 MG tablet Take 800 mg by mouth every morning.       Marland Kitchen albuterol (PROAIR HFA) 108 (90 BASE) MCG/ACT inhaler  Inhale 2 puffs into the lungs 4 (four) times daily. For COPD      . beclomethasone (QVAR) 80 MCG/ACT inhaler Inhale 2 puffs into the lungs 2 (two) times daily.       . busPIRone (BUSPAR) 10 MG tablet Take 20 mg by mouth 3 (three) times daily.       . clotrimazole-betamethasone (LOTRISONE) cream Apply 1 application topically 2 (two) times daily as needed. For face      . dexlansoprazole (DEXILANT) 60 MG capsule Take 60 mg by mouth daily.      Marland Kitchen FLUoxetine (PROZAC) 20 MG capsule Take 60 mg by mouth every morning.       Marland Kitchen glipiZIDE (GLUCOTROL XL) 10 MG 24 hr tablet Take 10 mg by mouth 2 (two) times daily.      . hydrochlorothiazide (HYDRODIURIL) 25 MG tablet Take 25 mg by mouth every morning.       Marland Kitchen HYDROcodone-acetaminophen (NORCO) 10-325 MG per tablet Take 1 tablet by mouth every 6 (six) hours as needed for pain.      Marland Kitchen ipratropium-albuterol (DUONEB) 0.5-2.5 (3) MG/3ML SOLN Take 3 mLs by nebulization every 6  (six) hours as needed (Asthma).      . levETIRAcetam (KEPPRA) 500 MG tablet Take 500 mg by mouth 2 (two) times daily.      Marland Kitchen levothyroxine (SYNTHROID, LEVOTHROID) 175 MCG tablet Take 175 mcg by mouth every morning.       . lidocaine (LIDODERM) 5 % Place 1 patch onto the skin daily as needed. Remove & Discard patch within 12 hours or as directed by MD      . meloxicam (MOBIC) 15 MG tablet Take 15 mg by mouth daily.      . metFORMIN (GLUCOPHAGE) 1000 MG tablet Take 1,000 mg by mouth 2 (two) times daily.       . mirtazapine (REMERON) 45 MG tablet Take 45 mg by mouth at bedtime as needed (Sleep). For insomnia      . OVER THE COUNTER MEDICATION Apply 1-2 application topically daily as needed (Diabetic neuropathy). Diabetic neuropathy cream      . pregabalin (LYRICA) 25 MG capsule Take 75 mg by mouth 3 (three) times daily.       Marland Kitchen tiZANidine (ZANAFLEX) 4 MG tablet Take 4 mg by mouth daily.      . traMADol (ULTRAM) 50 MG tablet Take 50 mg by mouth every 6 (six) hours as needed for pain.      . verapamil (CALAN) 40 MG tablet Take 40 mg by mouth 3 (three) times daily.        Allergies: Allergies as of 10/05/2012 - Review Complete 10/05/2012  Allergen Reaction Noted  . Ciprofloxacin Hives 03/18/2011  . Penicillins Hives 03/18/2011   Past Medical History  Diagnosis Date  . Hypertension   . Heart murmur   . Asthma   . Emphysema   . Diabetes mellitus   . Hyperlipidemia   . Allergic rhinitis   . Chronic headache   . OSA (obstructive sleep apnea)   . Neuropathy   . Grave's disease   . Herniated disc   . COPD (chronic obstructive pulmonary disease)   . Sleep apnea   . Menopause   . Hernia, umbilical   . Anxiety   . Depression   . Back pain, chronic   . Shingles   . Herniated disc   . Arthritis     rheumatoid  . 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 Generalized anxiety disorder 05/26/2012  . Chronic back pain 05/27/2012     Secondary to L4-5 herniated disc per patient   Past Surgical History  Procedure Laterality Date  . Cholecystectomy    . Tubal ligation    . Laparoscopy    . Foot surgery    . Eye surgery     Family History  Problem Relation Age of Onset  . Emphysema Mother   . Allergies Mother   . Allergies Daughter   . Asthma Mother   . Asthma Brother   . Heart disease Mother   . Rheum arthritis Mother   . Breast cancer Other     aunt  . Lung cancer Brother   . Throat cancer Brother    History   Social History  . Marital Status: Single    Spouse Name: N/A    Number of Children: 6  . Years of Education: N/A   Occupational History  . disabled. foster parent    Social History Main Topics  . Smoking status: Current Every Day Smoker -- 1.00 packs/day for 40 years    Types: Cigarettes  . Smokeless tobacco: Never Used  . Alcohol Use: No  . Drug Use: No  . Sexually Active: No   Other Topics Concern  . Not on file   Social History Narrative   Pt is separated and lives alone.          Review of Systems: HEENT: Headache as per HPI GI: Diarrhea  Neuro: worsening neuropathy and symptoms as per HPI MSK/Skin: bug bite on forehead, darkening skin, and scaly and swollen feet    Physical Exam: Blood pressure 121/69, pulse 77, temperature 98.9 F (37.2 C), temperature source Oral, resp. rate 11, height 5\' 9"  (1.753 m), weight 99.791 kg (220 lb), SpO2 93.00%. General: Pt laying on stomach in bed in minimal distress. Reports severe back pain preventing her from laying on her back.  HEENT: Head cephalic and atraumatic. No facial droop appreciated. Prominent exopthalmos. PERRLA  CV: Regular rate and rhythm with no murmurs, rubs, or gallops.  Pulm: Clear to auscultation bilaterally.  Neuro: CNII-XII grossly intact-- diminished sensation of right face in V1,V2, and V3 distribution. Diminished sensation and pain reported in LUE, LLE, and left foot. Finger to nose slowed on left side. Heel to  shin intact bilaterally.  MSK: 5/5 throughout. Able to reach 90degrees of hip flexion with no pain. Abdomen: symmetric and nontender with normal bowel sounds.    Lab results: Basic Metabolic Panel:  Recent Labs  16/10/96 1349  NA 136  K 3.6  CL 98  CO2 28  GLUCOSE 134*  BUN 13  CREATININE 1.11*  CALCIUM 8.9   Liver Function Tests:  Recent Labs  10/05/12 1349  AST 22  ALT 26  ALKPHOS 57  BILITOT 0.1*  PROT 6.9  ALBUMIN 3.9   CBC:  Recent Labs  10/05/12 1349  WBC 12.4*  NEUTROABS 9.0*  HGB 12.6  HCT 37.4  MCV 86.6  PLT 232   Cardiac Enzymes:  Recent Labs  10/05/12 1349  TROPONINI <0.30   CBG:  Recent Labs  10/05/12 1233  GLUCAP 65*   Coagulation:  Recent Labs  10/05/12 1349  LABPROT 15.1  INR 1.22   Urine Drug Screen: Drugs of Abuse     Component Value Date/Time   LABOPIA POSITIVE* 10/05/2012 1324   COCAINSCRNUR NONE DETECTED 10/05/2012 1324   LABBENZ NONE DETECTED 10/05/2012 1324  AMPHETMU NONE DETECTED 10/05/2012 1324   THCU POSITIVE* 10/05/2012 1324   LABBARB NONE DETECTED 10/05/2012 1324    Alcohol Level:  Recent Labs  10/05/12 1348  ETH <11   Urinalysis:  Recent Labs  10/05/12 1324  COLORURINE YELLOW  LABSPEC 1.013  PHURINE 5.5  GLUCOSEU NEGATIVE  HGBUR NEGATIVE  BILIRUBINUR NEGATIVE  KETONESUR NEGATIVE  PROTEINUR NEGATIVE  UROBILINOGEN 0.2  NITRITE NEGATIVE  LEUKOCYTESUR TRACE*   Misc. Labs:   Imaging results:  Ct Head (brain) Wo Contrast  10/05/2012   *RADIOLOGY REPORT*  Clinical Data:  Fall, with left arm numbness and slurred speech. Mild headache.  CT HEAD WITHOUT CONTRAST  Technique:  Contiguous axial images were obtained from the base of the skull through the vertex without contrast  Comparison:  06/26/2012.  Findings:  The brain has a normal appearance without evidence for hemorrhage, acute infarction, hydrocephalus, or mass lesion.  There is no extra axial fluid collection.  The skull and paranasal sinuses are  normal. There are chronic deformities of the orbits with suspected old medial right orbit and inferior left orbit blowout fractures and proptosis, stable.  IMPRESSION:       No acute or focal intracranial abnormality. 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   Original Report Authenticated By: Davonna Belling, M.D.    Other results: EKG: NSR, rate ~100bpm, normal axis, no ST changes, no LVH, unchanged from previous  Assessment & Plan by Problem: 57yo female smoker with h/o multiple comorbidities including DM and HTN, presents with slurred speech and LUE sensation deficits.    Dysarthria, Sensory Deficits:  Pt reported symptoms seem to have mostly resolved. At this time we do not know baseline neurologic function. Symptoms are most concerning for TIA/stroke given the stroke-like symptoms which have mostly resolved, current use of cigarrettes, and history of HTN and DM. Stroke is made less likely by negative CT. Start aspirin and order MRI to r/o stroke.  Also possible is a hypoglycemic state, d/t pt's ED glucap of 65. Hypoglycemia resolved after ED administered glucose, although neurologic symptoms did not resolve making this diagnosis less likely. Order fasting lipid profile, HgA1c to determine glucose/lipid control. Intoxication is an unlikely etiology d/t negative ethanol and drug screen and normal anion gap. Finally, polypharmacy should not be excluded d/t extensive medications including pain and sedation meds. Hold sedating medications and assess for improvement. Reassess meds at time of discharge to determine necessary regimen.   Swallow evaluation- passed. Start diabetic diet. PT/OT and Speech/Language consults. Reassess post MRI.     OSA (obstructive sleep apnea) Continue CPAP during hospital course   Diabetes mellitus Stop oral glipizide.  Start SSI-sensitive   Hypothyroidism Check TSH d/t recent change in medication. TSH normal- continue current home regimen.        This is a Psychologist, occupational Note.  The care of the patient was discussed with Drs. Kollar and [facility] and the assessment and plan was formulated with their assistance.  Please see their note for official documentation of the patient encounter.   Signed: Anabel Halon, Med Student 10/05/2012, 5:20 PM

## 2012-10-05 NOTE — Progress Notes (Signed)
Patient alert and oriented x4.  Pt received to this unit.

## 2012-10-05 NOTE — ED Notes (Signed)
Returned from MRI-- sleeping, but arouses easily---

## 2012-10-05 NOTE — ED Notes (Signed)
Pt. Woke up with lt. Arm is numbness and slurred speech.  Pt. Is alert and oriented X4.  PT. Is having rt. Hip /leg pain . Mild headache

## 2012-10-06 LAB — BASIC METABOLIC PANEL
CO2: 33 mEq/L — ABNORMAL HIGH (ref 19–32)
Calcium: 9.5 mg/dL (ref 8.4–10.5)
Glucose, Bld: 95 mg/dL (ref 70–99)
Potassium: 4.4 mEq/L (ref 3.5–5.1)
Sodium: 138 mEq/L (ref 135–145)

## 2012-10-06 LAB — HEMOGLOBIN A1C: Hgb A1c MFr Bld: 5.9 % — ABNORMAL HIGH (ref <5.7)

## 2012-10-06 LAB — CBC
HCT: 38.7 % (ref 36.0–46.0)
Hemoglobin: 12.8 g/dL (ref 12.0–15.0)
MCH: 29 pg (ref 26.0–34.0)
RBC: 4.42 MIL/uL (ref 3.87–5.11)

## 2012-10-06 LAB — LIPID PANEL
Cholesterol: 207 mg/dL — ABNORMAL HIGH (ref 0–200)
HDL: 37 mg/dL — ABNORMAL LOW (ref >39)
Total CHOL/HDL Ratio: 5.6 RATIO

## 2012-10-06 LAB — URINE CULTURE: Colony Count: 60000

## 2012-10-06 LAB — GLUCOSE, CAPILLARY: Glucose-Capillary: 91 mg/dL (ref 70–99)

## 2012-10-06 MED ORDER — ATORVASTATIN CALCIUM 40 MG PO TABS: 40.0000 mg | ORAL_TABLET | Freq: Every day | ORAL | Status: AC

## 2012-10-06 MED ORDER — ASPIRIN EC 81 MG PO TBEC: 81.0000 mg | DELAYED_RELEASE_TABLET | Freq: Every day | ORAL | Status: DC

## 2012-10-06 NOTE — Progress Notes (Signed)
Teaching service team to see pt soon; no new orders at this time.

## 2012-10-06 NOTE — Progress Notes (Signed)
Pt requesting pain meds at this time; no PRN orders; MD paged; will await callback.

## 2012-10-06 NOTE — Discharge Summary (Signed)
Name: Vanessa [name] MRN: [id number] DOB: [birth year] 57 y.o. PCP: Vanessa Davenport, MD  Date of Admission: 10/05/2012 12:11 PM Date of Discharge: 10/06/2012 Attending Physician: Vanessa Blue, DO  Discharge Diagnosis:   Dysarthria, sensory changes, TIA   OSA (obstructive sleep apnea)   Diabetes mellitus   Hypothyroidism   Generalized anxiety disorder   Low back pain.  Discharge Medications:   Medication List    STOP taking these medications       clotrimazole-betamethasone cream  Commonly known as:  LOTRISONE     HYDROcodone-acetaminophen 10-325 MG per tablet  Commonly known as:  NORCO     lidocaine 5 %  Commonly known as:  LIDODERM     OVER THE COUNTER MEDICATION     tiZANidine 4 MG tablet  Commonly known as:  ZANAFLEX      TAKE these medications       acyclovir 400 MG tablet  Commonly known as:  ZOVIRAX  Take 800 mg by mouth every morning.     aspirin EC 81 MG tablet  Take 1 tablet (81 mg total) by mouth daily.     atorvastatin 40 MG tablet  Commonly known as:  LIPITOR  Take 1 tablet (40 mg total) by mouth daily.     beclomethasone 80 MCG/ACT inhaler  Commonly known as:  QVAR  Inhale 2 puffs into the lungs 2 (two) times daily.     busPIRone 10 MG tablet  Commonly known as:  BUSPAR  Take 20 mg by mouth 3 (three) times daily.     dexlansoprazole 60 MG capsule  Commonly known as:  DEXILANT  Take 60 mg by mouth daily.     glipiZIDE 10 MG 24 hr tablet  Commonly known as:  GLUCOTROL XL  Take 10 mg by mouth 2 (two) times daily.     hydrochlorothiazide 25 MG tablet  Commonly known as:  HYDRODIURIL  Take 25 mg by mouth every morning.     ipratropium-albuterol 0.5-2.5 (3) MG/3ML Soln  Commonly known as:  DUONEB  Take 3 mLs by nebulization every 6 (six) hours as needed (Asthma).     levETIRAcetam 500 MG tablet  Commonly known as:  KEPPRA  Take 500 mg by mouth 2 (two) times daily.     levothyroxine 175 MCG tablet  Commonly known as:  SYNTHROID,  LEVOTHROID  Take 175 mcg by mouth every morning.     meloxicam 15 MG tablet  Commonly known as:  MOBIC  Take 15 mg by mouth daily.     metFORMIN 1000 MG tablet  Commonly known as:  GLUCOPHAGE  Take 1,000 mg by mouth 2 (two) times daily.     mirtazapine 45 MG tablet  Commonly known as:  REMERON  Take 45 mg by mouth at bedtime as needed (Sleep). For insomnia     pregabalin 25 MG capsule  Commonly known as:  LYRICA  Take 75 mg by mouth 3 (three) times daily.     PROAIR HFA 108 (90 BASE) MCG/ACT inhaler  Generic drug:  albuterol  Inhale 2 puffs into the lungs 4 (four) times daily. For COPD     PROZAC 20 MG capsule  Generic drug:  FLUoxetine  Take 60 mg by mouth every morning.     traMADol 50 MG tablet  Commonly known as:  ULTRAM  Take 50 mg by mouth every 6 (six) hours as needed for pain.     verapamil 40 MG tablet  Commonly known as:  CALAN  Take 40 mg by mouth 3 (three) times daily.        Disposition and follow-up:   Ms.Vanessa [name] was discharged from [facility] in Stable condition.  At the hospital follow up visit please address:  1.  Referral to addiction specialist/pain management.  Assess improvement of LUE, left facial numbness after stopping topical lidocaine/steroid cream/ OTC cream.  2.  Labs / imaging needed at time of follow-up: TSH in 1 month. Lipid panel in 6 months.  3.  Pending labs/ test needing follow-up: none  Follow-up Appointments:     Follow-up Information   Call Vanessa Lesch, MD. (please make appointment within the next week.)    Contact information:   [address] [place] [address] [phone]       Discharge Instructions: Discharge Orders   Future Orders Complete By Expires     Call MD for:  persistant dizziness or light-headedness  As directed     Call MD for:  persistant nausea and vomiting  As directed     Call MD for:  severe uncontrolled pain  As directed     Diet - low sodium heart  healthy  As directed     Discharge instructions  As directed     Comments:      Please take 1 81mg  Aspirin a day Take 1 tab of Atorvastatin every nighttime before bed. Continue taking your home Levothyroxine 175 mcg a day. Please see you PCP to follow up with Pain Management referral, recheck TSH, and adjust your home medications.    Increase activity slowly  As directed        Consultations:    Procedures Performed:  Dg Chest 2 View  10/05/2012   *RADIOLOGY REPORT*  Clinical Data: Stroke, left-sided pain.  CHEST - 2 VIEW  Comparison: 06/26/2012  Findings: Heart and mediastinal contours are within normal limits. No focal opacities or effusions.  No acute bony abnormality.  IMPRESSION: No active cardiopulmonary disease.   Original Report Authenticated By: Vanessa [name], M.D.   Ct Head (brain) Wo Contrast  10/05/2012   *RADIOLOGY REPORT*  Clinical Data:  Fall, with left arm numbness and slurred speech. Mild headache.  CT HEAD WITHOUT CONTRAST  Technique:  Contiguous axial images were obtained from the base of the skull through the vertex without contrast  Comparison:  06/26/2012.  Findings:  The brain has a normal appearance without evidence for hemorrhage, acute infarction, hydrocephalus, or mass lesion.  There is no extra axial fluid collection.  The skull and paranasal sinuses are normal. There are chronic deformities of the orbits with suspected old medial right orbit and inferior left orbit blowout fractures and proptosis, stable.  IMPRESSION:       No acute or focal intracranial abnormality. 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   Original Report Authenticated By: Vanessa [name], M.D.   Mr Brain Wo Contrast  10/05/2012   *RADIOLOGY REPORT*  Clinical Data: 57 year old female acute left side weakness. Possible stroke.  MRI HEAD WITHOUT CONTRAST  Technique:  Multiplanar, multiecho pulse sequences of the brain and surrounding structures were obtained according to standard protocol  without intravenous contrast.  Comparison: Head CTs 10/05/2012.  Brain MRI 05/26/2012.  Findings: Study is intermittently degraded by motion artifact despite repeated imaging attempts.  No restricted diffusion to suggest acute infarction.  No midline shift, ventriculomegaly, mass effect, evidence of mass lesion, extra-axial collection or acute intracranial hemorrhage. Cervicomedullary junction and pituitary are within normal limits. Negative visualized  cervical spine. Major intracranial vascular flow voids are stable.   Wallace Cullens and white matter signal throughout the brain is stable with only minimal nonspecific cerebral white matter T2 and FLAIR hyperintensity.  Stable orbit soft tissues.  There does appear to be a degree of exophthalmos with increased intraorbital fat.  This finding is chronic along with right lamina papyracea and left orbital floor fractures.  Stable paranasal sinuses and mastoids.  Negative scalp soft tissues.  Normal bone marrow signal.  IMPRESSION: 1. No acute intracranial abnormality and negative for age noncontrast MRI appearance the brain. 2.  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   Original Report Authenticated By: Erskine Speed, M.D.    Admission HPI: Chrishawna Farina is a 57 year old AA female with an extensive PMH notable for DMT2 with neuropathy, HTN, COPD, Hypothyroidism (Graves disease s/p radioablation), chronic back pain, sleep apnea, anxiety and depression. She presents to [facility] this afternoon after she became concerned about L facial droop, and some left arm weakness and numbness. The patient reports that yesterday she had a fight with her daughters and became very agitated and anxious. This also caused her chronic back pain to become worse. She reports she took her Vicodin and muscle relaxer (flexeril) but not remeron and tried to go to bed. She also notes that she has had increased swelling in  her hands and feet recently. She reports that she woke up feeling with some left arm numbness at 4am. She figured she slept on her arm wrong but the numbness did not immediately go away. She "Facetimed" her sister who told her that she had some left facial droop. She decided not to go to the hospital at this time. She continued to try to sleep until around 9am, when she called her sister and daughter. She says that she thinks they told her that her speech was slurred. She still did not decide to go to the ED and reports that she decided to clean the bathroom. She continued to experience some numbness while she used her left arm to clean the shower. At some point the patient decided to come to [facility] to be evaluated.   Attending H&P note: I have seen and evaluated [name] and discussed their care with the Residency Team.  57 yr. Old female with pmhx significant for HTN, Type 2 DM with neuropathy, COPD, s/p thyroid ablation for grave's dz and now hypothyroidism, chronic pain syndrome, tobacco abuse, anxiety/depression, patient reported substance abuse hx, presented with L facial droop and LUE numbness and weakness. The ED noted some left facial droop and felt the patient needed further evaluation. She was outside the time windows for tPA. On exam, she has no noted facial droop, no noted LUE weakness or decreased sensation, and CN II-XII are intact. She has no pronator drift. MRI of the brain was performed w/ contrast shows no evidence of CVA or acute proess.  She reports a chronic hx of back pain and has seen pain management in her home state of [place]. Review of narcotic database shows she has receive controlled substances from at least 4 providers in past 6 months. She is noted to be +THC and admits to marijuana use.  At this time, can add ASA for primary prevention. Educate on tobacco cessation. She needs to discuss with her PCP (which she states she has and will see in next few weeks) referral  to addiction specialist/pain management in [place]. At  this time we cannot prescribe narcotics for her chronic non-malignant pain, as we do not have documentation of radiological past findings and her hx raises concerns.  She is agreeable. No focal deficits on neuro exam. Stable for D/C.    Hospital Course by problem list:    Dysarthria, LUE numbness: Possible TIA, Patient was outside tPA window.  Pt received metabolic panel, CBC, CBG, coag tests and CT in ED (no acute process) and admitted for stroke-like symptoms. Started aspirin and received MRI which showed no acute process. No appreciated dysarthria or sensory defecit on admission exam except patient reported numbness of LUE and left side of face.  Obtained TSH, Lipid Panel, A1C.   OSA (obstructive sleep apnea) -Continued CPAP   Diabetes mellitus Possible hypoglycermic episode could have caused symptoms, FS glucose in ED was 65 although patient reported she ate a wafer cookie before checking herself into ED.  She did not check her blood glucose on morning of admission.  A1C was 5.9, well controlled on home medications.  Home medications were resumed.   Hypothyroidism   History of graves disease, s/p radioablation.  Patient reported taking 3/4 of her perscription of levothyroxine a day.  She was started on the full dose of 175 mcg and her TSH obtained was 5.1.  She was discharged on her full dose and recommended to follow up with PCP for TSH recheck.   Hyperlipidemia Fasting lipid panel abnormal (LDL 142, cholesterol 207, HDL 37, TG 140). Start on Lipitor 40mg  qhs. History of Chronic Low back pain  On admission straight leg raise to 90 degrees without pain.  Patient's pain medications were held.  Patient had +marijuana on UDS, and review of Narcotic database shows she has received controlled substances from at least 4 different providers in past 6 months.  After MRI was negative for stroke she was discharged and recommended Pain Mgt/Addiction  Mgt referral by PCP.    History of Chronic Low back pain  On admission straight leg raise to 90 degrees without pain.  Patient's pain medications were held.  Patient had +marijuana on UDS, and review of Narcotic database shows she has received controlled substances from at least 4 different providers in past 6 months.  After MRI was negative for stroke she was discharged and recommended Pain Mgt/Addiction Mgt referral by PCP.  Discharge Vitals:   BP 128/85  Pulse 89  Temp(Src) 98 F (36.7 C) (Oral)  Resp 18  Ht 5\' 9"  (1.753 m)  Wt 221 lb 9.6 oz (100.517 kg)  BMI 32.71 kg/m2  SpO2 98%  Discharge Labs:  Results for orders placed during the hospital encounter of 10/05/12 (from the past 24 hour(s))  ETHANOL     Status: None   Collection Time    10/05/12  1:48 PM      Result Value Range   Alcohol, Ethyl (B) <11  0 - 11 mg/dL  PROTIME-INR     Status: None   Collection Time    10/05/12  1:49 PM      Result Value Range   Prothrombin Time 15.1  11.6 - 15.2 seconds   INR 1.22  0.00 - 1.49  APTT     Status: Abnormal   Collection Time    10/05/12  1:49 PM      Result Value Range   aPTT 38 (*) 24 - 37 seconds  CBC     Status: Abnormal   Collection Time    10/05/12  1:49 PM  Result Value Range   WBC 12.4 (*) 4.0 - 10.5 K/uL   RBC 4.32  3.87 - 5.11 MIL/uL   Hemoglobin 12.6  12.0 - 15.0 g/dL   HCT 16.1  09.6 - 04.5 %   MCV 86.6  78.0 - 100.0 fL   MCH 29.2  26.0 - 34.0 pg   MCHC 33.7  30.0 - 36.0 g/dL   RDW 40.9  81.1 - 91.4 %   Platelets 232  150 - 400 K/uL  DIFFERENTIAL     Status: Abnormal   Collection Time    10/05/12  1:49 PM      Result Value Range   Neutrophils Relative % 72  43 - 77 %   Neutro Abs 9.0 (*) 1.7 - 7.7 K/uL   Lymphocytes Relative 20  12 - 46 %   Lymphs Abs 2.5  0.7 - 4.0 K/uL   Monocytes Relative 5  3 - 12 %   Monocytes Absolute 0.7  0.1 - 1.0 K/uL   Eosinophils Relative 2  0 - 5 %   Eosinophils Absolute 0.2  0.0 - 0.7 K/uL   Basophils Relative 0  0 -  1 %   Basophils Absolute 0.0  0.0 - 0.1 K/uL  COMPREHENSIVE METABOLIC PANEL     Status: Abnormal   Collection Time    10/05/12  1:49 PM      Result Value Range   Sodium 136  135 - 145 mEq/L   Potassium 3.6  3.5 - 5.1 mEq/L   Chloride 98  96 - 112 mEq/L   CO2 28  19 - 32 mEq/L   Glucose, Bld 134 (*) 70 - 99 mg/dL   BUN 13  6 - 23 mg/dL   Creatinine, Ser 7.82 (*) 0.50 - 1.10 mg/dL   Calcium 8.9  8.4 - 95.6 mg/dL   Total Protein 6.9  6.0 - 8.3 g/dL   Albumin 3.9  3.5 - 5.2 g/dL   AST 22  0 - 37 U/L   ALT 26  0 - 35 U/L   Alkaline Phosphatase 57  39 - 117 U/L   Total Bilirubin 0.1 (*) 0.3 - 1.2 mg/dL   GFR calc non Af Amer 55 (*) >90 mL/min   GFR calc Af Amer 64 (*) >90 mL/min  TROPONIN I     Status: None   Collection Time    10/05/12  1:49 PM      Result Value Range   Troponin I <0.30  <0.30 ng/mL  POCT I-STAT TROPONIN I     Status: None   Collection Time    10/05/12  2:27 PM      Result Value Range   Troponin i, poc 0.00  0.00 - 0.08 ng/mL   Comment 3           GLUCOSE, CAPILLARY     Status: None   Collection Time    10/05/12  8:09 PM      Result Value Range   Glucose-Capillary 89  70 - 99 mg/dL   Comment 1 Notify RN    HEMOGLOBIN A1C     Status: Abnormal   Collection Time    10/06/12  4:55 AM      Result Value Range   Hemoglobin A1C 5.9 (*) <5.7 %   Mean Plasma Glucose 123 (*) <117 mg/dL  CBC     Status: None   Collection Time    10/06/12  4:55 AM      Result Value  Range   WBC 9.8  4.0 - 10.5 K/uL   RBC 4.42  3.87 - 5.11 MIL/uL   Hemoglobin 12.8  12.0 - 15.0 g/dL   HCT 16.1  09.6 - 04.5 %   MCV 87.6  78.0 - 100.0 fL   MCH 29.0  26.0 - 34.0 pg   MCHC 33.1  30.0 - 36.0 g/dL   RDW 40.9  81.1 - 91.4 %   Platelets 219  150 - 400 K/uL  BASIC METABOLIC PANEL     Status: Abnormal   Collection Time    10/06/12  4:55 AM      Result Value Range   Sodium 138  135 - 145 mEq/L   Potassium 4.4  3.5 - 5.1 mEq/L   Chloride 98  96 - 112 mEq/L   CO2 33 (*) 19 - 32 mEq/L     Glucose, Bld 95  70 - 99 mg/dL   BUN 13  6 - 23 mg/dL   Creatinine, Ser 7.82  0.50 - 1.10 mg/dL   Calcium 9.5  8.4 - 95.6 mg/dL   GFR calc non Af Amer 61 (*) >90 mL/min   GFR calc Af Amer 71 (*) >90 mL/min  LIPID PANEL     Status: Abnormal   Collection Time    10/06/12  4:55 AM      Result Value Range   Cholesterol 207 (*) 0 - 200 mg/dL   Triglycerides 213  <086 mg/dL   HDL 37 (*) >57 mg/dL   Total CHOL/HDL Ratio 5.6     VLDL 28  0 - 40 mg/dL   LDL Cholesterol 846 (*) 0 - 99 mg/dL  TSH     Status: Abnormal   Collection Time    10/06/12  4:55 AM      Result Value Range   TSH 5.101 (*) 0.350 - 4.500 uIU/mL  GLUCOSE, CAPILLARY     Status: Abnormal   Collection Time    10/06/12  8:12 AM      Result Value Range   Glucose-Capillary 139 (*) 70 - 99 mg/dL  GLUCOSE, CAPILLARY     Status: None   Collection Time    10/06/12 11:25 AM      Result Value Range   Glucose-Capillary 91  70 - 99 mg/dL    Signed: Carlynn Purl, DO 10/06/2012, 1:42 PM   Time Spent on Discharge: 35 minutes Services Ordered on Discharge: none Equipment Ordered on Discharge: none

## 2012-10-06 NOTE — Progress Notes (Signed)
OT Cancellation Note  Patient Details Name: Vanessa [name] MRN: [id number] DOB: March 13, 1956   Cancelled Treatment:    Reason Eval/Treat Not Completed: OT screened, no needs identified, will sign off.  10/06/2012 Cipriano Mile OTR/L Pager [phone] Office [phone]

## 2012-10-06 NOTE — Evaluation (Signed)
Physical Therapy Evaluation Patient Details Name: Laporshia Hogen MRN: [id number] DOB: 1955/12/23 Today's Date: 10/06/2012 Time: 4098-1191 PT Time Calculation (min): 24 min  PT Assessment / Plan / Recommendation History of Present Illness  Left UE and face numbness with negative MRI  Clinical Impression  Pt presents without  mobility, strength or balance deficits. Pt at baseline functional status and reports LUE numbness compared to RUE but able to sense light touch and pressure without difficulty. Pt educated for CVA risk factors, signs and symptoms and educated for importance of early arrival to hospital if symptoms occur again. Currently no need for PT/OT and pt aware and agreeable. Will sign off.     PT Assessment  Patent does not need any further PT services    Follow Up Recommendations  No PT follow up    Does the patient have the potential to tolerate intense rehabilitation      Barriers to Discharge        Equipment Recommendations  None recommended by PT    Recommendations for Other Services     Frequency      Precautions / Restrictions Precautions Precautions: None   Pertinent Vitals/Pain No pain      Mobility  Bed Mobility Bed Mobility: Not assessed Transfers Transfers: Sit to Stand;Stand to Sit Sit to Stand: 7: Independent;From bed;From toilet Stand to Sit: 7: Independent;To toilet;To chair/3-in-1 Ambulation/Gait Ambulation/Gait Assistance: 7: Independent Ambulation Distance (Feet): 200 Feet Assistive device: None Ambulation/Gait Assistance Details: including head turns and change of direction Gait Pattern: Within Functional Limits Gait velocity: WFL Stairs: Yes Stairs Assistance: 6: Modified independent (Device/Increase time) Stair Management Technique: One rail Left Number of Stairs: 5 Modified Rankin (Stroke Patients Only) Pre-Morbid Rankin Score: No symptoms Modified Rankin: No significant disability    Exercises     PT Diagnosis:    PT  Problem List:   PT Treatment Interventions:       PT Goals(Current goals can be found in the care plan section) Acute Rehab PT Goals PT Goal Formulation: No goals set, d/c therapy  Visit Information  Last PT Received On: 10/06/12 Assistance Needed: +1 History of Present Illness: Left UE and face numbness with negative MRI       Prior Functioning  Home Living Family/patient expects to be discharged to:: Private residence Living Arrangements: Alone Type of Home: House Home Access: Stairs to enter Secretary/administrator of Steps: 4 Entrance Stairs-Rails: Right Home Layout: One level Home Equipment: None Prior Function Level of Independence: Independent Communication Communication: No difficulties    Cognition  Cognition Arousal/Alertness: Awake/alert Behavior During Therapy: WFL for tasks assessed/performed Overall Cognitive Status: Within Functional Limits for tasks assessed    Extremity/Trunk Assessment Upper Extremity Assessment Upper Extremity Assessment: Overall WFL for tasks assessed (4/5 bil UE with report of slight sensation deficit LUE) Lower Extremity Assessment Lower Extremity Assessment: Overall WFL for tasks assessed (4-5/5 all myotomes bil LE) Cervical / Trunk Assessment Cervical / Trunk Assessment: Normal   Balance High Level Balance High Level Balance Activites: Direction changes;Head turns High Level Balance Comments: no difficulty with head turns or change of direction  End of Session PT - End of Session Equipment Utilized During Treatment: Gait belt Activity Tolerance: Patient tolerated treatment well Patient left: in chair;with call bell/phone within reach;with family/visitor present  GP     Toney Sang Beth 10/06/2012, 9:42 AM Delaney Meigs, PT [phone]

## 2012-10-06 NOTE — Progress Notes (Signed)
Pt given d/c instructions; pt verbalized understanding; pt to d/c home with son.

## 2012-10-06 NOTE — Progress Notes (Signed)
Utilization review completed.  

## 2012-10-06 NOTE — H&P (Signed)
INTERNAL MEDICINE TEACHING SERVICE Attending Admission Note  Date: 10/06/2012  Patient name: Vanessa [name]  Medical record number: [id number]  Date of birth: 1956/01/12    I have seen and evaluated [name] and discussed their care with the Residency Team.   57 yr. Old female with pmhx significant for HTN, Type 2 DM with neuropathy, COPD, s/p thyroid ablation for grave's dz and now hypothyroidism, chronic pain syndrome, tobacco abuse, anxiety/depression, patient reported substance abuse hx, presented with L facial droop and LUE numbness and weakness.  The ED noted some left facial droop and felt the patient needed further evaluation. She was outside the time windows for tPA.  On exam, she has no noted facial droop, no noted LUE weakness or decreased sensation, and CN II-XII are intact. She has no pronator drift. MRI of the brain was performed w/ contrast shows no evidence of CVA or acute proess.   She reports a chronic hx of back pain and has seen pain management in her home state of . Review of narcotic database shows she has receive controlled substances from at least 4 providers in past 6 months. She is noted to be +THC and admits to marijuana use.   At this time, can add ASA for primary prevention. Educate on tobacco cessation. She needs to discuss with her PCP (which she states she has and will see in next few weeks) referral to addiction specialist/pain management in [place]. At this time we cannot prescribe narcotics for her chronic non-malignant pain, as we do not have documentation of radiological past findings and her hx raises concerns.   She is agreeable. No focal deficits on neuro exam. Stable for D/C.  Jonah Blue, DO 7/10/20141:12 PM

## 2012-10-06 NOTE — Evaluation (Signed)
Speech Language Pathology Evaluation Patient Details Name: Danitza Schoenfeldt MRN: [id number] DOB: [birth year] Today's Date: 10/06/2012 Time: 0865-7846 SLP Time Calculation (min): 21 min  Problem List:  Patient Active Problem List   Diagnosis Date Noted  . Dysarthria 10/05/2012  . Diarrhea 05/27/2012  . Chronic back pain 05/27/2012  . Diabetes mellitus 05/26/2012  . Hypothyroidism 05/26/2012  . 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 Depression 05/26/2012  . Generalized anxiety disorder 05/26/2012  . OSA (obstructive sleep apnea) 03/18/2011   Past Medical History:  Past Medical History  Diagnosis Date  . Hypertension   . Heart murmur   . Asthma   . Emphysema   . Diabetes mellitus   . Hyperlipidemia   . Allergic rhinitis   . Chronic headache   . OSA (obstructive sleep apnea)   . Neuropathy   . Grave's disease   . Herniated disc   . COPD (chronic obstructive pulmonary disease)   . Sleep apnea   . Menopause   . Hernia, umbilical   . Anxiety   . Depression   . Back pain, chronic   . Shingles   . Herniated disc   . Arthritis     rheumatoid  . 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 Generalized anxiety disorder 05/26/2012  . Chronic back pain 05/27/2012    Secondary to L4-5 herniated disc per patient   Past Surgical History:  Past Surgical History  Procedure Laterality Date  . Cholecystectomy    . Tubal ligation    . Laparoscopy    . Foot surgery    . Eye surgery     HPI:  57 yo female who is on disability admit to [facility] with left arm weakness and slurred speech.  PMH + for asthma, sleep apnea, COPD, neuropathy, allergic rhinitis, chronic back pain, Graves disease, HTN, heart murmur, anxiety disorder.  Pt with h/o THC and occasional ETOH use.  She demonstrated decreased right facial sensation but no droop.  Pt reports her slurred speech has largely resolved. She admits to h/o slurred speech episodes  at home previously which she attributes to not taking some of the medications.     Assessment / Plan / Recommendation Clinical Impression  Pt presents with functional cognitive linguistic abilities - no dysarthria or significant cognitive linguistic deficits apparent.  Note pt's MRI head was negative.  Pt was able to have a higher level conversation re: plans, medical events and college classes with fluent speech and no language deficits.  She reports paying her bills online and denies vision changes.  Pt appears to be at baseline level of function.  SLP to sign off, please re-order if desire.     SLP Assessment  Patient does not need any further Speech Lanaguage Pathology Services    Follow Up Recommendations    n/a   Frequency and Duration   n/a     Pertinent Vitals/Pain Afebrile,decreased   SLP Goals   n/a  SLP Evaluation Prior Functioning  Cognitive/Linguistic Baseline: Within functional limits (per pt WFL, pt taking online college classes) Type of Home: House Vocation: Student   Cognition  Overall Cognitive Status: Within Functional Limits for tasks assessed Arousal/Alertness: Awake/alert Orientation Level: Oriented X4 Attention: Selective Selective Attention: Appears intact Memory: Appears intact Awareness: Appears intact Problem Solving: Appears intact Safety/Judgment: Appears intact    Comprehension  Auditory Comprehension Overall Auditory Comprehension: Appears within functional limits for tasks assessed Conversation: Complex Visual Recognition/Discrimination  Discrimination: Not tested Reading Comprehension Reading Status: Not tested    Expression Expression Primary Mode of Expression: Verbal Verbal Expression Overall Verbal Expression: Appears within functional limits for tasks assessed Initiation: No impairment Repetition: No impairment Pragmatics: No impairment Written Expression Dominant Hand: Right Written Expression: Not tested   Oral / Motor  Oral Motor/Sensory Function Overall Oral Motor/Sensory Function: Appears within functional limits for tasks assessed Motor Speech Overall Motor Speech: Appears within functional limits for tasks assessed   GO     Donavan Burnet, MS [facility] SLP [phone]

## 2012-10-06 NOTE — Progress Notes (Signed)
I have seen the patient and reviewed the daily progress note by Anabel Halon MS 3 and discussed the care of the patient with them.  See below for documentation of my findings, assessment, and plans.  Subjective: Patient reports she is feeling better, her symptoms continue to improve.  She denies any facial droop, slurred speech, F/C, nausea or vomiting, HA.  Reports she does still have some low back pain and is now wearing her back brace. Objective: Vital signs in last 24 hours: Filed Vitals:   10/06/12 0817 10/06/12 1010 10/06/12 1336 10/06/12 1347  BP: 134/82 128/85  133/84  Pulse:  77 89 90  Temp: 98.2 F (36.8 C) 98 F (36.7 C)  99.2 F (37.3 C)  TempSrc: Oral Oral  Oral  Resp: 18 18 18 19   Height:      Weight:      SpO2: 95% 98% 98% 97%   Weight change:   Intake/Output Summary (Last 24 hours) at 10/06/12 1755 Last data filed at 10/06/12 1348  Gross per 24 hour  Intake   1080 ml  Output      0 ml  Net   1080 ml   General: resting in bed, NAD HEENT:  EOMI, exophthalmos  Cardiac: RRR, no rubs, murmurs or gallops appreciated Pulm: clear to auscultation bilaterally, no wheezing Abd: soft, nontender, nondistended, BS normoactive Ext: warm and well perfused, no pedal edema Neuro: alert and oriented X3, cranial nerves II-XII grossly intact, continues to report some decreased sensation on left side of face along V1,V2, and V3 dermatomes, as well as across multiple dermatomes along her left arm.  Patient with 5/5 muscle strength in LUE and LLE.  Lab Results: Reviewed and documented in Electronic Record Micro Results: Reviewed and documented in Electronic Record Studies/Results: Reviewed and documented in Electronic Record Medications: I have reviewed the patient's current medications. Scheduled Meds: Scheduled Meds:  .  albuterol  2 puff  Inhalation  QID   .  aspirin  300 mg  Rectal  Daily    Or   .  aspirin  325 mg  Oral  Daily   .  enoxaparin (LOVENOX) injection  40  mg  Subcutaneous  Q24H   .  FLUoxetine  60 mg  Oral  q morning - 10a   .  fluticasone  2 puff  Inhalation  BID   .  insulin aspart  0-9 Units  Subcutaneous  TID WC   .  levothyroxine  175 mcg  Oral  QAC breakfast   .  meloxicam  15 mg  Oral  Daily   .  nicotine  21 mg  Transdermal  Daily   .  pregabalin  75 mg  Oral  TID   .  senna-docusate  1 tablet  Oral  QHS     Continuous Infusions:none PRN Meds:.none Assessment/Plan:   Dysarthria, Sensory Deficits -Patient's reported slurred speech and facial droop have resolved.  It is possible that the patient has had a TIA but unlikely to have had stroke with negative CT and MRI and resolution of all symptoms other than some numbness of her skin.  Patient does report a history of diabetic neuropathy and takes a number of medications that can be both sedating and have the potential to be numbing.  Patient will be discharged home today with recommendation to start taking a 81mg  Aspirin, and start a high dose statin.  Patient also has reports she has been taking only 3/4 of her thyroid  medication pills, her TSH was elevated and she was told to resume her full dose thyroid medication and follow up TSH with PCP.   OSA (obstructive sleep apnea) -Cpap   Diabetes mellitus -A1C 5.9, will resume home medications on discharge   Hypothyroidism TSH 5.1 continue levothyroxine.  Recheck TSH as outpatient.   Chronic Back Pain. -Patients pain meds were held while in hospital as they may have contributed to her symptoms.  Upon review of patients controlled substance reporting she seems to have been prescribed medications by 4 different providers and is positive on her UDS for marijuana.  Patient reported that her PCP told her he will refer her to a pain and addiction specialist which we agree with.  Dispo: patient will be discharged to home today.  The patient does have a current PCP Lerry Liner, MD) and does not need an [facility] hospital follow-up  appointment after discharge.  The patient does not have transportation limitations that hinder transportation to clinic appointments.  .Services Needed at time of discharge: Y = Yes, Blank = No PT:   OT:   RN:   Equipment:   Other:     LOS: 1 day   Carlynn Purl, DO 10/06/2012, 5:55 PM

## 2012-10-06 NOTE — Progress Notes (Signed)
Subjective: 57yo female smoker with extensive PMH including DM, HTN, Graves Dz, OSA, on hospital day 2 after presenting with slurred speech and LEU sensory deficits. Pt reports symptoms have improved overnight, with no facial drooping, dysarthria, or headache noted. Left arm remains a little numb. Pt slept well through the night with no acute problems. Son Oncologist) and young child with pt at bedside. Expected d/c today, 7/10. Pt cleared by PT/OT and Speech/Language.  Objective: Vital signs in last 24 hours: Filed Vitals:   10/06/12 0400 10/06/12 0600 10/06/12 0817 10/06/12 1010  BP: 145/78 131/88 134/82 128/85  Pulse: 77 76  77  Temp: 97.8 F (36.6 C) 97.9 F (36.6 C) 98.2 F (36.8 C) 98 F (36.7 C)  TempSrc: Oral Axillary Oral Oral  Resp: 18 18 18 18   Height:      Weight: 100.517 kg (221 lb 9.6 oz)     SpO2: 95% 95% 95% 98%    Intake/Output Summary (Last 24 hours) at 10/06/12 1235 Last data filed at 10/06/12 1010  Gross per 24 hour  Intake    720 ml  Output      0 ml  Net    720 ml   Physical Exam General: Pt sitting up in bed in NAD conversing easily.  HEENT: No facial droop appreciated, pupils reactive to accomodation Neuro: Pt reports decreased sensation left arm.   CNIII, IV, VI: EOMI  CNV: decreased sensation on L face in V1,V2,V3 distribution  CNVII: No facial muscle weakness  CNIX: No tongue deviation  CNXI: No trapezius/SCM weakness  CNXII: No trouble swallowing MSK: 5/5 hand grip strength Extremities: warm, pink, no edema     Lab Results: Basic Metabolic Panel:  Recent Labs Lab 10/05/12 1349 10/06/12 0455  NA 136 138  K 3.6 4.4  CL 98 98  CO2 28 33*  GLUCOSE 134* 95  BUN 13 13  CREATININE 1.11* 1.01  CALCIUM 8.9 9.5   Liver Function Tests:  Recent Labs Lab 10/05/12 1349  AST 22  ALT 26  ALKPHOS 57  BILITOT 0.1*  PROT 6.9  ALBUMIN 3.9   No results found for this basename: LIPASE, AMYLASE,  in the last 168 hours No results found for  this basename: AMMONIA,  in the last 168 hours CBC:  Recent Labs Lab 10/05/12 1349 10/06/12 0455  WBC 12.4* 9.8  NEUTROABS 9.0*  --   HGB 12.6 12.8  HCT 37.4 38.7  MCV 86.6 87.6  PLT 232 219   Cardiac Enzymes:  Recent Labs Lab 10/05/12 1349  TROPONINI <0.30   CBG:  Recent Labs Lab 10/05/12 1233 10/05/12 2009 10/06/12 0812 10/06/12 1125  GLUCAP 65* 89 139* 91   Fasting Lipid Panel:  Recent Labs Lab 10/06/12 0455  CHOL 207*  HDL 37*  LDLCALC 142*  TRIG 140  CHOLHDL 5.6   Thyroid Function Tests:  Recent Labs Lab 10/06/12 0455  TSH 5.101*   Coagulation:  Recent Labs Lab 10/05/12 1349  LABPROT 15.1  INR 1.22   Urine Drug Screen: Drugs of Abuse     Component Value Date/Time   LABOPIA POSITIVE* 10/05/2012 1324   COCAINSCRNUR NONE DETECTED 10/05/2012 1324   LABBENZ NONE DETECTED 10/05/2012 1324   AMPHETMU NONE DETECTED 10/05/2012 1324   THCU POSITIVE* 10/05/2012 1324   LABBARB NONE DETECTED 10/05/2012 1324    Alcohol Level:  Recent Labs Lab 10/05/12 1348  ETH <11   Urinalysis:  Recent Labs Lab 10/05/12 1324  COLORURINE YELLOW  LABSPEC 1.013  PHURINE 5.5  GLUCOSEU NEGATIVE  HGBUR NEGATIVE  BILIRUBINUR NEGATIVE  KETONESUR NEGATIVE  PROTEINUR NEGATIVE  UROBILINOGEN 0.2  NITRITE NEGATIVE  LEUKOCYTESUR TRACE*    Studies/Results: Dg Chest 2 View  10/05/2012   *RADIOLOGY REPORT*  Clinical Data: Stroke, left-sided pain.  CHEST - 2 VIEW  Comparison: 06/26/2012  Findings: Heart and mediastinal contours are within normal limits. No focal opacities or effusions.  No acute bony abnormality.  IMPRESSION: No active cardiopulmonary disease.   Original Report Authenticated By: Charlett Nose, M.D.   Ct Head (brain) Wo Contrast  10/05/2012   *RADIOLOGY REPORT*  Clinical Data:  Fall, with left arm numbness and slurred speech. Mild headache.  CT HEAD WITHOUT CONTRAST  Technique:  Contiguous axial images were obtained from the base of the skull through the vertex  without contrast  Comparison:  06/26/2012.  Findings:  The brain has a normal appearance without evidence for hemorrhage, acute infarction, hydrocephalus, or mass lesion.  There is no extra axial fluid collection.  The skull and paranasal sinuses are normal. There are chronic deformities of the orbits with suspected old medial right orbit and inferior left orbit blowout fractures and proptosis, stable.  IMPRESSION:       No acute or focal intracranial abnormality. 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   Original Report Authenticated By: Davonna Belling, M.D.   Mr Brain Wo Contrast  10/05/2012   *RADIOLOGY REPORT*  Clinical Data: 57 year old female acute left side weakness. Possible stroke.  MRI HEAD WITHOUT CONTRAST  Technique:  Multiplanar, multiecho pulse sequences of the brain and surrounding structures were obtained according to standard protocol without intravenous contrast.  Comparison: Head CTs 10/05/2012.  Brain MRI 05/26/2012.  Findings: Study is intermittently degraded by motion artifact despite repeated imaging attempts.  No restricted diffusion to suggest acute infarction.  No midline shift, ventriculomegaly, mass effect, evidence of mass lesion, extra-axial collection or acute intracranial hemorrhage. Cervicomedullary junction and pituitary are within normal limits. Negative visualized cervical spine. Major intracranial vascular flow voids are stable.   Wallace Cullens and white matter signal throughout the brain is stable with only minimal nonspecific cerebral white matter T2 and FLAIR hyperintensity.  Stable orbit soft tissues.  There does appear to be a degree of exophthalmos with increased intraorbital fat.  This finding is chronic along with right lamina papyracea and left orbital floor fractures.  Stable paranasal sinuses and mastoids.  Negative scalp soft tissues.  Normal bone marrow signal.  IMPRESSION: 1. No acute intracranial abnormality and negative for age noncontrast MRI appearance  the brain. 2.  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   Original Report Authenticated By: Erskine Speed, M.D.   Medications:  Scheduled Meds: . albuterol  2 puff Inhalation QID  . aspirin  300 mg Rectal Daily   Or  . aspirin  325 mg Oral Daily  . enoxaparin (LOVENOX) injection  40 mg Subcutaneous Q24H  . FLUoxetine  60 mg Oral q morning - 10a  . fluticasone  2 puff Inhalation BID  . insulin aspart  0-9 Units Subcutaneous TID WC  . levothyroxine  175 mcg Oral QAC breakfast  . meloxicam  15 mg Oral Daily  . nicotine  21 mg Transdermal Daily  . pregabalin  75 mg Oral TID  . senna-docusate  1 tablet Oral QHS   Assessment/Plan: Principal Problem:   Dysarthria, Sensory Deficits Based on CT/MRI, informed pt no evidence of CVA. Recommended aspirin, 81mg  once  a day for stroke prevention.  Discussed pt's polypharmacy, use of narcotics and illicit substances, and recommended Pain Mgt/Addiction Mgt referral by PCP. Explained to pt we could not prescribe her any new narcotics because of her history. Pt has seen 4 different providers for narcotics in past 6months and tested positive for marijuana. Pt was amenable to see a specialist. Dysarthria improved, pt has history of DM with neuropathy, continues to report some sensory changes which could be attributable to her neuropathy. In addition pt reports using lidocaine cream and other unknown OTC cream for her neuropathy. Pt was encouraged to d/c these medications and speak with her PCP about alternatives. Active Problems:   Hyperlipidemia Start on high-dose statin. Lipitor 40mg . Total cholesterol 207, LDL, 140 Goal LDL <70.   OSA (obstructive sleep apnea) Continue use of CPAP at home   Diabetes mellitus Upon d/c, stop SSI-sensitive Restart home meds Current A1c 5.9, well controlled, pt to speak with PCP about potentially adjusting home meds.   Hypothyroidism TSH of  5.1, resume 175mg  on levothyroxine. Recheck TSH as outpatient.   Chronic Back Pain Pt will obtain referral to Pain Mgt/Addiction Mgt for any pain evaluation. Restart Lyrica on d/c.   Smoking Cessation Pt understands risk of smoking and is willing to try cutting back or quitting. Recommended cutting back on her cigarettes/day to 1/2 pack. Pt also expressed willingness to try nicotine patches, saying they have worked for her in the hospital. Consider d/c'ing with information on 'quit smoking hotline'.      This is a Psychologist, occupational Note.  The care of the patient was discussed with Dr. Mikey Bussing and the assessment and plan formulated with their assistance.  Please see their attached note for official documentation of the daily encounter.   LOS: 1 day   Anabel Halon, Med Student 10/06/2012, 12:35 PM

## 2012-10-06 NOTE — Progress Notes (Signed)
Pt to d/c home; IV and tele monitor d/c at this time; pt wanting to shower prior to d/c home.

## 2012-10-10 NOTE — Discharge Summary (Signed)
  Date: 10/10/2012  Patient name: Vanessa [name]  Medical record number: [id number]  Date of birth: 1955-12-20   This patient has been discussed with the house staff. Please see their note for complete details. I concur with their findings and plan. Jonah Blue, DO 10/10/2012, 7:53 AM

## 2013-06-07 ENCOUNTER — Telehealth: Payer: Self-pay | Admitting: Pulmonary Disease

## 2013-06-07 NOTE — Telephone Encounter (Signed)
Spoke with patient-she is aware that she is due for OV with [facility]-last seen 05-2012., Pt was added to [facility] schedule for tomorrow at 10:30am. She will discuss CPAP needs with [facility] tomorrow.

## 2013-06-08 ENCOUNTER — Ambulatory Visit: Payer: Medicaid Other | Admitting: Pulmonary Disease

## 2013-06-08 ENCOUNTER — Telehealth: Payer: Self-pay | Admitting: Pulmonary Disease

## 2013-06-08 DIAGNOSIS — G4733 Obstructive sleep apnea (adult) (pediatric): Secondary | ICD-10-CM

## 2013-06-08 NOTE — Telephone Encounter (Signed)
Order was sent to [facility] Spoke with pt and notified that this was done  She verbalized understanding  Nothing further needed

## 2013-06-08 NOTE — Telephone Encounter (Signed)
Ok to send

## 2013-06-08 NOTE — Telephone Encounter (Signed)
Called and spoke with pt. She is wanting order sent to [facility] for new mask and hose. She reports she has not used her CPAP bc she has had no mask x 2 months. Per pt is has unraveled and the hose as well. She has pending appt 06/27/13 w/ [facility]. Please advise Dr. Shelle Iron thanks

## 2013-06-26 ENCOUNTER — Telehealth: Payer: Self-pay | Admitting: Pulmonary Disease

## 2013-06-26 NOTE — Telephone Encounter (Signed)
Order was placed on 06/08/13:  Note Please send order to [facility] for new CPAP mask and hose thanks  -------  [email or web address] for pt

## 2013-06-27 ENCOUNTER — Ambulatory Visit: Payer: Medicaid Other | Admitting: Pulmonary Disease

## 2013-06-27 NOTE — Telephone Encounter (Signed)
Pt has called back. °

## 2013-06-27 NOTE — Telephone Encounter (Signed)
I spoke with Iris at [facility] and she states the pt insurance will not cover the mask and hose since last OV was 05-2012. Pt needs an OV and then they will pay for mask. Pt no showed for an appt today with [facility]. I have [id number] for the pt to advise that she needs an appt in order to get mask. Carron Curie, CMA

## 2013-06-27 NOTE — Telephone Encounter (Signed)
Called spoke with pt. Aware will need OV per insurance. appt scheduled for 07/06/13 at 12 w/ [facility].

## 2013-07-05 ENCOUNTER — Emergency Department (HOSPITAL_COMMUNITY)
Admission: EM | Admit: 2013-07-05 | Discharge: 2013-07-06 | Disposition: A | Discharge status: Home / Self Care | Payer: Medicaid Other | Attending: Emergency Medicine | Admitting: Emergency Medicine

## 2013-07-05 ENCOUNTER — Encounter (HOSPITAL_COMMUNITY): Payer: Self-pay | Admitting: Emergency Medicine

## 2013-07-05 ENCOUNTER — Emergency Department (HOSPITAL_COMMUNITY): Payer: Medicaid Other

## 2013-07-05 DIAGNOSIS — R071 Chest pain on breathing: Secondary | ICD-10-CM | POA: Insufficient documentation

## 2013-07-05 DIAGNOSIS — IMO0002 Reserved for concepts with insufficient information to code with codable children: Secondary | ICD-10-CM | POA: Insufficient documentation

## 2013-07-05 DIAGNOSIS — Z8719 Personal history of other diseases of the digestive system: Secondary | ICD-10-CM | POA: Insufficient documentation

## 2013-07-05 DIAGNOSIS — F329 Major depressive disorder, single episode, unspecified: Secondary | ICD-10-CM | POA: Insufficient documentation

## 2013-07-05 DIAGNOSIS — Z7982 Long term (current) use of aspirin: Secondary | ICD-10-CM | POA: Insufficient documentation

## 2013-07-05 DIAGNOSIS — J45909 Unspecified asthma, uncomplicated: Secondary | ICD-10-CM | POA: Insufficient documentation

## 2013-07-05 DIAGNOSIS — Z8619 Personal history of other infectious and parasitic diseases: Secondary | ICD-10-CM | POA: Insufficient documentation

## 2013-07-05 DIAGNOSIS — M069 Rheumatoid arthritis, unspecified: Secondary | ICD-10-CM | POA: Insufficient documentation

## 2013-07-05 DIAGNOSIS — G4733 Obstructive sleep apnea (adult) (pediatric): Secondary | ICD-10-CM | POA: Insufficient documentation

## 2013-07-05 DIAGNOSIS — J40 Bronchitis, not specified as acute or chronic: Secondary | ICD-10-CM

## 2013-07-05 DIAGNOSIS — F17200 Nicotine dependence, unspecified, uncomplicated: Secondary | ICD-10-CM | POA: Insufficient documentation

## 2013-07-05 DIAGNOSIS — F411 Generalized anxiety disorder: Secondary | ICD-10-CM | POA: Insufficient documentation

## 2013-07-05 DIAGNOSIS — Z791 Long term (current) use of non-steroidal anti-inflammatories (NSAID): Secondary | ICD-10-CM | POA: Insufficient documentation

## 2013-07-05 DIAGNOSIS — J438 Other emphysema: Secondary | ICD-10-CM | POA: Insufficient documentation

## 2013-07-05 DIAGNOSIS — G8929 Other chronic pain: Secondary | ICD-10-CM | POA: Insufficient documentation

## 2013-07-05 DIAGNOSIS — Z880 Allergy status to penicillin: Secondary | ICD-10-CM | POA: Insufficient documentation

## 2013-07-05 DIAGNOSIS — R509 Fever, unspecified: Secondary | ICD-10-CM | POA: Insufficient documentation

## 2013-07-05 DIAGNOSIS — Z8742 Personal history of other diseases of the female genital tract: Secondary | ICD-10-CM | POA: Insufficient documentation

## 2013-07-05 DIAGNOSIS — E785 Hyperlipidemia, unspecified: Secondary | ICD-10-CM | POA: Insufficient documentation

## 2013-07-05 DIAGNOSIS — E119 Type 2 diabetes mellitus without complications: Secondary | ICD-10-CM | POA: Insufficient documentation

## 2013-07-05 DIAGNOSIS — I10 Essential (primary) hypertension: Secondary | ICD-10-CM | POA: Insufficient documentation

## 2013-07-05 DIAGNOSIS — Z79899 Other long term (current) drug therapy: Secondary | ICD-10-CM | POA: Insufficient documentation

## 2013-07-05 DIAGNOSIS — F3289 Other specified depressive episodes: Secondary | ICD-10-CM | POA: Insufficient documentation

## 2013-07-05 DIAGNOSIS — R011 Cardiac murmur, unspecified: Secondary | ICD-10-CM | POA: Insufficient documentation

## 2013-07-05 LAB — BASIC METABOLIC PANEL
BUN: 12 mg/dL (ref 6–23)
CALCIUM: 9.6 mg/dL (ref 8.4–10.5)
CO2: 26 mEq/L (ref 19–32)
CREATININE: 1.13 mg/dL — AB (ref 0.50–1.10)
Chloride: 98 mEq/L (ref 96–112)
GFR calc Af Amer: 62 mL/min — ABNORMAL LOW (ref >90)
GFR, EST NON AFRICAN AMERICAN: 53 mL/min — AB (ref >90)
GLUCOSE: 125 mg/dL — AB (ref 70–99)
Potassium: 4.4 mEq/L (ref 3.7–5.3)
SODIUM: 136 meq/L — AB (ref 137–147)

## 2013-07-05 LAB — I-STAT TROPONIN, ED: TROPONIN I, POC: 0 ng/mL (ref 0.00–0.08)

## 2013-07-05 LAB — CBC
HCT: 39.8 % (ref 36.0–46.0)
Hemoglobin: 13.3 g/dL (ref 12.0–15.0)
MCH: 29 pg (ref 26.0–34.0)
MCHC: 33.4 g/dL (ref 30.0–36.0)
MCV: 86.7 fL (ref 78.0–100.0)
PLATELETS: 257 10*3/uL (ref 150–400)
RBC: 4.59 MIL/uL (ref 3.87–5.11)
RDW: 13.6 % (ref 11.5–15.5)
WBC: 12.5 10*3/uL — ABNORMAL HIGH (ref 4.0–10.5)

## 2013-07-05 MED ORDER — MORPHINE SULFATE 4 MG/ML IJ SOLN
4.0000 mg | Freq: Once | INTRAMUSCULAR | Status: AC
  Administered 2013-07-05: 4 mg via INTRAVENOUS
  Filled 2013-07-05: qty 1

## 2013-07-05 NOTE — ED Provider Notes (Signed)
CSN: [id number]     Arrival date & time 07/05/13  1843 History  This chart was scribed for non-physician practitioner Ivonne Andrew, PA-C working with Juliet Rude. Rubin Payor, MD by Danella Maiers, ED Scribe. This patient was seen in room WTR8/WTR8 and the patient's care was started at 10:51 PM.    Chief Complaint  Patient presents with  . Cough   The history is provided by the patient. No language interpreter was used.   HPI Comments: Vanessa [name] is a 58 y.o. female with a h/o COPD, bronchitis, HTN, and DM who presents to the Emergency Department complaining of constant, gradually-worsening right rib pain secondary to cough for the past week. She states deep breathing makes it worse. Eating and drinking does not change the pain. She has been taking Vicodin with no relief. She also reports difficulty breathing. States she does breathing treatments at home regularly and has increased to every 3-4 hours. States her cough was productive initially but dry the last few days. She reports low-grade fever. She states she feels like she has pneumonia. She denies rhinorrhea or congestion. She has an appointment with her pulmonologist tomorrow. She has a h/o cholecystectomy.   Past Medical History  Diagnosis Date  . Hypertension   . Heart murmur   . Asthma   . Emphysema   . Diabetes mellitus   . Hyperlipidemia   . Allergic rhinitis   . Chronic headache   . OSA (obstructive sleep apnea)   . Neuropathy   . Grave's disease   . Herniated disc   . COPD (chronic obstructive pulmonary disease)   . Sleep apnea   . Menopause   . Hernia, umbilical   . Anxiety   . Depression   . Back pain, chronic   . Shingles   . Herniated disc   . Arthritis     rheumatoid  . 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 Generalized anxiety disorder 05/26/2012  . Chronic back pain 05/27/2012    Secondary to L4-5 herniated disc per patient   Past Surgical  History  Procedure Laterality Date  . Cholecystectomy    . Tubal ligation    . Laparoscopy    . Foot surgery    . Eye surgery     Family History  Problem Relation Age of Onset  . Emphysema Mother   . Allergies Mother   . Allergies Daughter   . Asthma Mother   . Asthma Brother   . Heart disease Mother   . Rheum arthritis Mother   . Breast cancer Other     aunt  . Lung cancer Brother   . Throat cancer Brother    History  Substance Use Topics  . Smoking status: Current Every Day Smoker -- 1.00 packs/day for 40 years    Types: Cigarettes  . Smokeless tobacco: Never Used  . Alcohol Use: No   OB History   Grav Para Term Preterm Abortions TAB SAB Ect Mult Living   5 3   2  2   3      Review of Systems  Constitutional: Positive for fever.  HENT: Negative for congestion and rhinorrhea.   Respiratory: Positive for cough.   All other systems reviewed and are negative.     Allergies  Ciprofloxacin and Penicillins  Home Medications   Current Outpatient Rx  Name  Route  Sig  Dispense  Refill  . acyclovir (ZOVIRAX) 400 MG tablet  Oral   Take 800 mg by mouth every morning.          Marland Kitchen albuterol (PROAIR HFA) 108 (90 BASE) MCG/ACT inhaler   Inhalation   Inhale 2 puffs into the lungs 4 (four) times daily. For COPD         . aspirin EC 81 MG tablet   Oral   Take 1 tablet (81 mg total) by mouth daily.   30 tablet   0   . atorvastatin (LIPITOR) 40 MG tablet   Oral   Take 1 tablet (40 mg total) by mouth daily.   30 tablet   0   . beclomethasone (QVAR) 80 MCG/ACT inhaler   Inhalation   Inhale 2 puffs into the lungs 2 (two) times daily.          . busPIRone (BUSPAR) 10 MG tablet   Oral   Take 20 mg by mouth 3 (three) times daily.          Marland Kitchen dexlansoprazole (DEXILANT) 60 MG capsule   Oral   Take 60 mg by mouth daily.         Marland Kitchen FLUoxetine (PROZAC) 20 MG capsule   Oral   Take 60 mg by mouth every morning.          Marland Kitchen glipiZIDE (GLUCOTROL XL) 10 MG  24 hr tablet   Oral   Take 10 mg by mouth 2 (two) times daily.         . hydrochlorothiazide (HYDRODIURIL) 25 MG tablet   Oral   Take 25 mg by mouth every morning.          Marland Kitchen ipratropium-albuterol (DUONEB) 0.5-2.5 (3) MG/3ML SOLN   Nebulization   Take 3 mLs by nebulization every 6 (six) hours as needed (Asthma).         . levETIRAcetam (KEPPRA) 500 MG tablet   Oral   Take 500 mg by mouth 2 (two) times daily.         Marland Kitchen levothyroxine (SYNTHROID, LEVOTHROID) 175 MCG tablet   Oral   Take 175 mcg by mouth every morning.          . meloxicam (MOBIC) 15 MG tablet   Oral   Take 15 mg by mouth daily.         . metFORMIN (GLUCOPHAGE) 1000 MG tablet   Oral   Take 1,000 mg by mouth 2 (two) times daily.          . mirtazapine (REMERON) 45 MG tablet   Oral   Take 45 mg by mouth at bedtime as needed (Sleep). For insomnia         . pregabalin (LYRICA) 25 MG capsule   Oral   Take 75 mg by mouth 3 (three) times daily.          . traMADol (ULTRAM) 50 MG tablet   Oral   Take 50 mg by mouth every 6 (six) hours as needed for pain.         . verapamil (CALAN) 40 MG tablet   Oral   Take 40 mg by mouth 3 (three) times daily.          BP 116/71  Temp(Src) 100 F (37.8 C) (Oral)  Resp 16  SpO2 96% Physical Exam  Nursing note and vitals reviewed. Constitutional: She is oriented to person, place, and time. She appears well-developed and well-nourished. No distress.  HENT:  Head: Normocephalic and atraumatic.  Eyes: EOM are normal.  Neck: Neck supple. No  tracheal deviation present.  Cardiovascular: Normal rate.   Pulmonary/Chest: Effort normal. No respiratory distress. She has no wheezes. She has no rales. She exhibits tenderness.  Right chest wall pain. No gross deformities. No swelling or crepitus.  Abdominal: Soft. There is no tenderness. There is no rebound, no guarding, no tenderness at McBurney's point and negative Murphy's sign.  Musculoskeletal: Normal  range of motion.  Neurological: She is alert and oriented to person, place, and time.  Skin: Skin is warm and dry.  Psychiatric: She has a normal mood and affect. Her behavior is normal.    ED Course  Procedures Medications  morphine 4 MG/ML injection 4 mg (4 mg Intravenous Given 07/05/13 2323)    DIAGNOSTIC STUDIES: Oxygen Saturation is 96% on RA, normal by my interpretation.    COORDINATION OF CARE: 10:53 PM- Discussed treatment plan with pt. Pt agrees to plan.  Patient continues to appear well. Normal respirations O2 sats. Right-sided chest wall pain improved after morphine. Negative d-dimer. Do not suspect PE. Patient does have a history of COPD. Low-grade temperature of 100 Fahrenheit. Slightly elevated WBC. Will plan to cover her with antibiotics. Patient instructed to continue her home breathing medications and followup with her pulmonologist as planned tomorrow. She agrees with plan.   Results for orders placed during the hospital encounter of 07/05/13  BASIC METABOLIC PANEL      Result Value Ref Range   Sodium 136 (*) 137 - 147 mEq/L   Potassium 4.4  3.7 - 5.3 mEq/L   Chloride 98  96 - 112 mEq/L   CO2 26  19 - 32 mEq/L   Glucose, Bld 125 (*) 70 - 99 mg/dL   BUN 12  6 - 23 mg/dL   Creatinine, Ser 6.28 (*) 0.50 - 1.10 mg/dL   Calcium 9.6  8.4 - 31.5 mg/dL   GFR calc non Af Amer 53 (*) >90 mL/min   GFR calc Af Amer 62 (*) >90 mL/min  CBC      Result Value Ref Range   WBC 12.5 (*) 4.0 - 10.5 K/uL   RBC 4.59  3.87 - 5.11 MIL/uL   Hemoglobin 13.3  12.0 - 15.0 g/dL   HCT 17.6  16.0 - 73.7 %   MCV 86.7  78.0 - 100.0 fL   MCH 29.0  26.0 - 34.0 pg   MCHC 33.4  30.0 - 36.0 g/dL   RDW 10.6  26.9 - 48.5 %   Platelets 257  150 - 400 K/uL  D-DIMER, QUANTITATIVE      Result Value Ref Range   D-Dimer, Quant <0.27  0.00 - 0.48 ug/mL-FEU  I-STAT TROPOININ, ED      Result Value Ref Range   Troponin i, poc 0.00  0.00 - 0.08 ng/mL   Comment 3               Imaging Review Dg  Chest 2 View (if Patient Has Fever And/or Copd)  07/05/2013   CLINICAL DATA:  Chest pain.  EXAM: CHEST  2 VIEW  COMPARISON:  10/05/2012  FINDINGS: The heart size and mediastinal contours are within normal limits. Both lungs are clear. The visualized skeletal structures are unremarkable.  IMPRESSION: No active cardiopulmonary disease.   Electronically Signed   By: Loralie Champagne M.D.   On: 07/05/2013 19:24     EKG Interpretation None      Date: 07/05/2013  Rate: 77  Rhythm: normal sinus rhythm  QRS Axis: normal  Intervals: normal  ST/T  Wave abnormalities: normal  Conduction Disutrbances:none  Narrative Interpretation:   Old EKG Reviewed: none available    MDM   Final diagnoses:  Bronchitis    I personally performed the services described in this documentation, which was scribed in my presence. The recorded information has been reviewed and is accurate.   Angus SellerPeter S Destiny Hagin, PA-C 07/06/13 [phone]

## 2013-07-05 NOTE — ED Notes (Signed)
Pt states she has had cough and rib pain x 1 wk (from coughing).  States she may have pna.  Hx of COPD, bronchitis.

## 2013-07-06 ENCOUNTER — Encounter: Payer: Self-pay | Admitting: Pulmonary Disease

## 2013-07-06 ENCOUNTER — Ambulatory Visit (INDEPENDENT_AMBULATORY_CARE_PROVIDER_SITE_OTHER): Payer: Medicaid Other | Admitting: Pulmonary Disease

## 2013-07-06 VITALS — BP 122/86 | HR 88 | Temp 98.2°F | Ht 69.0 in | Wt 230.6 lb

## 2013-07-06 DIAGNOSIS — G4733 Obstructive sleep apnea (adult) (pediatric): Secondary | ICD-10-CM

## 2013-07-06 LAB — D-DIMER, QUANTITATIVE: D-Dimer, Quant: <0.27 ug/mL-FEU (ref 0.00–0.48)

## 2013-07-06 MED ORDER — BENZONATATE 100 MG PO CAPS: 100.0000 mg | ORAL_CAPSULE | Freq: Three times a day (TID) | ORAL | Status: DC

## 2013-07-06 MED ORDER — AZITHROMYCIN 250 MG PO TABS: ORAL_TABLET | ORAL | Status: DC

## 2013-07-06 NOTE — Assessment & Plan Note (Signed)
The patient has not been wearing her CPAP compliantly because of a torn mask. She tells me that her home care company has not been willing to give her a new mask, and it is unclear why this is the case. I will send an order to her home care company for a new mask and supplies, and will also give him to put her machine on the automatic setting. I've encouraged her to work aggressively on weight loss.

## 2013-07-06 NOTE — Progress Notes (Signed)
   Subjective:    Patient ID: Vanessa [name], female    DOB: 01-14-1956, 58 y.o.   MRN: [id number]  HPI The patient comes in today for followup of her obstructive sleep apnea. She has not been wearing her device compliantly because of a torn mask, and it is unclear why she has not been able to get a new one. She feels that she is not sleeping well, and I explained that is because she is not wearing her CPAP.   Review of Systems  Constitutional: Positive for fever. Negative for unexpected weight change.  HENT: Positive for rhinorrhea. Negative for congestion, dental problem, ear pain, nosebleeds, postnasal drip, sinus pressure, sneezing, sore throat and trouble swallowing.   Eyes: Negative for redness and itching.  Respiratory: Positive for cough and shortness of breath. Negative for chest tightness and wheezing.   Cardiovascular: Positive for leg swelling. Negative for palpitations.  Gastrointestinal: Negative for nausea and vomiting.  Genitourinary: Negative for dysuria.  Musculoskeletal: Positive for joint swelling.  Skin: Negative for rash.  Neurological: Negative for headaches.  Hematological: Does not bruise/bleed easily.  Psychiatric/Behavioral: Negative for dysphoric mood. The patient is not nervous/anxious.        Objective:   Physical Exam Obese female in no acute distress Nose without purulence or discharge noted No skin breakdown or pressure necrosis from the CPAP mask Neck without lymphadenopathy or thyromegaly Lower extremities with minimal edema, no cyanosis Alert and oriented, moves all 4 extremities.       Assessment & Plan:

## 2013-07-06 NOTE — Discharge Instructions (Signed)
You were seen and evaluated for your cough, chest pain and shortness of breath. Your lab testing has not shown any signs concerning or emergent cause of your symptoms. Please take the medications prescribed and followup with your doctor.   Bronchitis Bronchitis is swelling (inflammation) of the air tubes leading to your lungs (bronchi). This causes mucus and a cough. If the swelling gets bad, you may have trouble breathing. HOME CARE   Rest.  Drink enough fluids to keep your pee (urine) clear or pale yellow (unless you have a condition where you have to watch how much you drink).  Only take medicine as told by your doctor. If you were given antibiotic medicines, finish them even if you start to feel better.  Avoid smoke, irritating chemicals, and strong smells. These make the problem worse. Quit smoking if you smoke. This helps your lungs heal faster.  Use a cool mist humidifier. Change the water in the humidifier every day. You can also sit in the bathroom with hot shower running for 5 10 minutes. Keep the door closed.  See your health care provider as told.  Wash your hands often. GET HELP IF: Your problems do not get better after 1 week. GET HELP RIGHT AWAY IF:   Your fever gets worse.  You have chills.  Your chest hurts.  Your problems breathing get worse.  You have blood in your mucus.  You pass out (faint).  You feel lightheaded.  You have a bad headache.  You throw up (vomit) again and again. MAKE SURE YOU:  Understand these instructions.  Will watch your condition.  Will get help right away if you are not doing well or get worse. Document Released: 09/02/2007 Document Revised: 01/04/2013 Document Reviewed: 11/08/2012 [facility] Patient Information 2014 [place], [place].

## 2013-07-06 NOTE — Patient Instructions (Signed)
Will send an order to your equipment company to get you a new mask and supplies. Work on weight loss followup with me in one year

## 2013-07-10 NOTE — ED Provider Notes (Signed)
Medical screening examination/treatment/procedure(s) were performed by non-physician practitioner and as supervising physician I was immediately available for consultation/collaboration.   EKG Interpretation   Date/Time:  Wednesday July 05 2013 23:18:37 EDT Ventricular Rate:  77 PR Interval:  151 QRS Duration: 89 QT Interval:  394 QTC Calculation: 446 R Axis:   -3 Text Interpretation:  Sinus rhythm Probable anteroseptal infarct, old ED  PHYSICIAN INTERPRETATION AVAILABLE IN [facility] Confirmed by TEST,  Record ([id number]) on 07/07/2013 7:10:08 AM       Juliet Rude. Rubin Payor, MD 07/10/13 [phone]

## 2013-07-12 ENCOUNTER — Emergency Department (HOSPITAL_COMMUNITY)
Admission: EM | Admit: 2013-07-12 | Discharge: 2013-07-12 | Disposition: A | Discharge status: Home / Self Care | Payer: Medicaid Other | Attending: Emergency Medicine | Admitting: Emergency Medicine

## 2013-07-12 ENCOUNTER — Encounter (HOSPITAL_COMMUNITY): Payer: Self-pay | Admitting: Emergency Medicine

## 2013-07-12 ENCOUNTER — Emergency Department (HOSPITAL_COMMUNITY): Payer: Medicaid Other

## 2013-07-12 DIAGNOSIS — F17200 Nicotine dependence, unspecified, uncomplicated: Secondary | ICD-10-CM | POA: Insufficient documentation

## 2013-07-12 DIAGNOSIS — F329 Major depressive disorder, single episode, unspecified: Secondary | ICD-10-CM | POA: Insufficient documentation

## 2013-07-12 DIAGNOSIS — Z79899 Other long term (current) drug therapy: Secondary | ICD-10-CM | POA: Insufficient documentation

## 2013-07-12 DIAGNOSIS — M549 Dorsalgia, unspecified: Secondary | ICD-10-CM | POA: Insufficient documentation

## 2013-07-12 DIAGNOSIS — Z8742 Personal history of other diseases of the female genital tract: Secondary | ICD-10-CM | POA: Insufficient documentation

## 2013-07-12 DIAGNOSIS — G589 Mononeuropathy, unspecified: Secondary | ICD-10-CM | POA: Insufficient documentation

## 2013-07-12 DIAGNOSIS — E119 Type 2 diabetes mellitus without complications: Secondary | ICD-10-CM | POA: Insufficient documentation

## 2013-07-12 DIAGNOSIS — E0500 Thyrotoxicosis with diffuse goiter without thyrotoxic crisis or storm: Secondary | ICD-10-CM | POA: Insufficient documentation

## 2013-07-12 DIAGNOSIS — M25561 Pain in right knee: Secondary | ICD-10-CM

## 2013-07-12 DIAGNOSIS — Z880 Allergy status to penicillin: Secondary | ICD-10-CM | POA: Insufficient documentation

## 2013-07-12 DIAGNOSIS — Z8619 Personal history of other infectious and parasitic diseases: Secondary | ICD-10-CM | POA: Insufficient documentation

## 2013-07-12 DIAGNOSIS — J449 Chronic obstructive pulmonary disease, unspecified: Secondary | ICD-10-CM | POA: Insufficient documentation

## 2013-07-12 DIAGNOSIS — I10 Essential (primary) hypertension: Secondary | ICD-10-CM | POA: Insufficient documentation

## 2013-07-12 DIAGNOSIS — IMO0002 Reserved for concepts with insufficient information to code with codable children: Secondary | ICD-10-CM | POA: Insufficient documentation

## 2013-07-12 DIAGNOSIS — M069 Rheumatoid arthritis, unspecified: Secondary | ICD-10-CM | POA: Insufficient documentation

## 2013-07-12 DIAGNOSIS — M7120 Synovial cyst of popliteal space [Baker], unspecified knee: Secondary | ICD-10-CM | POA: Insufficient documentation

## 2013-07-12 DIAGNOSIS — M79609 Pain in unspecified limb: Secondary | ICD-10-CM | POA: Insufficient documentation

## 2013-07-12 DIAGNOSIS — Z792 Long term (current) use of antibiotics: Secondary | ICD-10-CM | POA: Insufficient documentation

## 2013-07-12 DIAGNOSIS — G8929 Other chronic pain: Secondary | ICD-10-CM | POA: Insufficient documentation

## 2013-07-12 DIAGNOSIS — J4489 Other specified chronic obstructive pulmonary disease: Secondary | ICD-10-CM | POA: Insufficient documentation

## 2013-07-12 DIAGNOSIS — Z791 Long term (current) use of non-steroidal anti-inflammatories (NSAID): Secondary | ICD-10-CM | POA: Insufficient documentation

## 2013-07-12 DIAGNOSIS — F3289 Other specified depressive episodes: Secondary | ICD-10-CM | POA: Insufficient documentation

## 2013-07-12 DIAGNOSIS — F411 Generalized anxiety disorder: Secondary | ICD-10-CM | POA: Insufficient documentation

## 2013-07-12 DIAGNOSIS — Z923 Personal history of irradiation: Secondary | ICD-10-CM | POA: Insufficient documentation

## 2013-07-12 DIAGNOSIS — R011 Cardiac murmur, unspecified: Secondary | ICD-10-CM | POA: Insufficient documentation

## 2013-07-12 DIAGNOSIS — Z8719 Personal history of other diseases of the digestive system: Secondary | ICD-10-CM | POA: Insufficient documentation

## 2013-07-12 DIAGNOSIS — E785 Hyperlipidemia, unspecified: Secondary | ICD-10-CM | POA: Insufficient documentation

## 2013-07-12 MED ORDER — KETOROLAC TROMETHAMINE 60 MG/2ML IM SOLN
60.0000 mg | Freq: Once | INTRAMUSCULAR | Status: AC
  Administered 2013-07-12: 60 mg via INTRAMUSCULAR
  Filled 2013-07-12: qty 2

## 2013-07-12 MED ORDER — NAPROXEN 500 MG PO TABS: 500.0000 mg | ORAL_TABLET | Freq: Two times a day (BID) | ORAL | Status: DC

## 2013-07-12 MED ORDER — OXYCODONE-ACETAMINOPHEN 5-325 MG PO TABS
1.0000 | ORAL_TABLET | Freq: Once | ORAL | Status: AC
  Administered 2013-07-12: 1 via ORAL
  Filled 2013-07-12: qty 1

## 2013-07-12 MED ORDER — OXYCODONE-ACETAMINOPHEN 5-325 MG PO TABS: 1.0000 | ORAL_TABLET | ORAL | Status: DC | PRN

## 2013-07-12 NOTE — Discharge Instructions (Signed)
Naprosyn for pain and inflammation. Percocet for severe pain. Keep ACE wrap for compression. Keep leg elevated. Ice. Follow up with primary care doctor or orthopedics specialist. Return if worsening.    Baker Cyst A Baker cyst is a sac-like structure that forms in the back of the knee. It is filled with the same fluid that is located in your knee. This fluid lubricates the bones and cartilage of the knee and allows them to move over each other more easily. CAUSES  When the knee becomes injured or inflamed, increased fluid forms in the knee. When this happens, the joint lining is pushed out behind the knee and forms the Baker cyst. This cyst may also be caused by inflammation from arthritic conditions and infections. SIGNS AND SYMPTOMS  A Baker cyst usually has no symptoms. When the cyst is substantially enlarged:  You may feel pressure behind the knee, stiffness in the knee, or a mass in the area behind the knee.  You may develop pain, redness, and swelling in the calf. This can suggest a blood clot and requires evaluation by your health care provider. DIAGNOSIS  A Baker cyst is most often found during an ultrasound exam. This exam may have been performed for other reasons, and the cyst was found incidentally. Sometimes an MRI is used. This picks up other problems within a joint that an ultrasound exam may not. If the Baker cyst developed immediately after an injury, X-ray exams may be used to diagnose the cyst. TREATMENT  The treatment depends on the cause of the cyst. Anti-inflammatory medicines and rest often will be prescribed. If the cyst is caused by a bacterial infection, antibiotic medicines may be prescribed.  HOME CARE INSTRUCTIONS   If the cyst was caused by an injury, for the first 24 hours, keep the injured leg elevated on 2 pillows while lying down.  For the first 24 hours while you are awake, apply ice to the injured area:  Put ice in a plastic bag.  Place a towel between  your skin and the bag.  Leave the ice on for 20 minutes, 2 3 times a day.  Only take over-the-counter or prescription medicines for pain, discomfort, or fever as directed by your health care provider.  Only take antibiotic medicine as directed. Make sure to finish it even if you start to feel better. MAKE SURE YOU:   Understand these instructions.  Will watch your condition.  Will get help right away if you are not doing well or get worse. Document Released: 03/16/2005 Document Revised: 01/04/2013 Document Reviewed: 10/26/2012 [facility] Patient Information 2014 [place], [place].

## 2013-07-12 NOTE — Progress Notes (Signed)
*  PRELIMINARY RESULTS* Vascular Ultrasound Right lower extremity venous duplex has been completed.  Preliminary findings: No evidence of DVT. 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   Glendale Chard RVT 07/12/2013, 3:50 PM

## 2013-07-12 NOTE — ED Notes (Signed)
Pt c/o rt knee pain since Monday.  No injury.

## 2013-07-12 NOTE — ED Provider Notes (Signed)
CSN: [id number]     Arrival date & time 07/12/13  1358 History   First MD Initiated Contact with Patient 07/12/13 1417     Chief Complaint  Patient presents with  . Knee Pain     (Consider location/radiation/quality/duration/timing/severity/associated sxs/prior Treatment) HPI Vanessa [name] is a 58 y.o. female who presents emergency department complaining of right knee pain. Patient states her knee pain began 3 days ago when she woke up in the morning. She states that she has been working prior and has been on her feet a lot. She denies any injuries to her knee. She denies any redness or warmth to the touch over the joint. She states she feels like her lower leg and her ankle is swollen as well. States also is having chronic pain in her back and thinks maybe this was the pain radiating from her back. She denies any recent back injuries either. Patient currently takes Ultram and Norco for her chronic pain, states she has been taking it but it is not helping her. She denies any numbness or weakness in lower extremities. Stats also tried applying heat with no relief. Denies any prior similar knee problems, but states she has had arthritis in her knee.   Past Medical History  Diagnosis Date  . Hypertension   . Heart murmur   . Asthma   . Emphysema   . Diabetes mellitus   . Hyperlipidemia   . Allergic rhinitis   . Chronic headache   . OSA (obstructive sleep apnea)   . Neuropathy   . Grave's disease   . Herniated disc   . COPD (chronic obstructive pulmonary disease)   . Sleep apnea   . Menopause   . Hernia, umbilical   . Anxiety   . Depression   . Back pain, chronic   . Shingles   . Herniated disc   . Arthritis     rheumatoid  . 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 Generalized anxiety disorder 05/26/2012  . Chronic back pain 05/27/2012    Secondary to L4-5 herniated disc per patient   Past Surgical History   Procedure Laterality Date  . Cholecystectomy    . Tubal ligation    . Laparoscopy    . Foot surgery    . Eye surgery     Family History  Problem Relation Age of Onset  . Emphysema Mother   . Allergies Mother   . Allergies Daughter   . Asthma Mother   . Asthma Brother   . Heart disease Mother   . Rheum arthritis Mother   . Breast cancer Other     aunt  . Lung cancer Brother   . Throat cancer Brother    History  Substance Use Topics  . Smoking status: Current Every Day Smoker -- 1.00 packs/day for 40 years    Types: Cigarettes  . Smokeless tobacco: Never Used  . Alcohol Use: No   OB History   Grav Para Term Preterm Abortions TAB SAB Ect Mult Living   5 3   2  2   3      Review of Systems  Constitutional: Negative for fever and chills.  Musculoskeletal: Positive for arthralgias, back pain and joint swelling.  Skin: Negative for color change, rash and wound.  Neurological: Negative for weakness and numbness.      Allergies  Ciprofloxacin and Penicillins  Home Medications   Prior to Admission medications  Medication Sig Start Date End Date Taking? Authorizing Provider  acyclovir (ZOVIRAX) 400 MG tablet Take 800 mg by mouth every morning.    Yes Historical Provider, MD  albuterol (PROAIR HFA) 108 (90 BASE) MCG/ACT inhaler Inhale 2 puffs into the lungs 4 (four) times daily. For COPD   Yes Historical Provider, MD  atorvastatin (LIPITOR) 40 MG tablet Take 1 tablet (40 mg total) by mouth daily. 10/06/12  Yes Carlynn Purl, DO  beclomethasone (QVAR) 80 MCG/ACT inhaler Inhale 2 puffs into the lungs 2 (two) times daily.    Yes Historical Provider, MD  benzonatate (TESSALON) 100 MG capsule Take 1 capsule (100 mg total) by mouth every 8 (eight) hours. 07/06/13  Yes Peter S Dammen, PA-C  busPIRone (BUSPAR) 10 MG tablet Take 20 mg by mouth 3 (three) times daily.    Yes Historical Provider, MD  diclofenac sodium (VOLTAREN) 1 % GEL Apply topically 4 (four) times daily.   Yes  Historical Provider, MD  FLUoxetine (PROZAC) 20 MG capsule Take 60 mg by mouth every morning.    Yes Historical Provider, MD  glipiZIDE (GLUCOTROL XL) 10 MG 24 hr tablet Take 10 mg by mouth 2 (two) times daily.   Yes Historical Provider, MD  hydrochlorothiazide (HYDRODIURIL) 25 MG tablet Take 25 mg by mouth every morning.    Yes Historical Provider, MD  HYDROcodone-acetaminophen (NORCO) 10-325 MG per tablet Take 1 tablet by mouth every 6 (six) hours as needed.   Yes Historical Provider, MD  ipratropium-albuterol (DUONEB) 0.5-2.5 (3) MG/3ML SOLN Take 3 mLs by nebulization every 6 (six) hours as needed (Asthma).   Yes Historical Provider, MD  levothyroxine (SYNTHROID, LEVOTHROID) 175 MCG tablet Take 175 mcg by mouth every morning.    Yes Historical Provider, MD  lisinopril (PRINIVIL,ZESTRIL) 10 MG tablet Take 10 mg by mouth daily.   Yes Historical Provider, MD  meloxicam (MOBIC) 15 MG tablet Take 15 mg by mouth daily.   Yes Historical Provider, MD  metFORMIN (GLUCOPHAGE) 1000 MG tablet Take 1,000 mg by mouth 2 (two) times daily.    Yes Historical Provider, MD  mirtazapine (REMERON) 45 MG tablet Take 45 mg by mouth at bedtime as needed (Sleep). For insomnia   Yes Historical Provider, MD  pregabalin (LYRICA) 25 MG capsule Take 75 mg by mouth 3 (three) times daily.    Yes Historical Provider, MD  QUEtiapine (SEROQUEL) 25 MG tablet Take 25 mg by mouth at bedtime.   Yes Historical Provider, MD  traMADol (ULTRAM) 50 MG tablet Take 50 mg by mouth every 6 (six) hours as needed for pain.   Yes Historical Provider, MD  verapamil (CALAN) 40 MG tablet Take 40 mg by mouth 3 (three) times daily.   Yes Historical Provider, MD  azithromycin (ZITHROMAX Z-PAK) 250 MG tablet Take 2 tabs on day 1.  Take 1 tab on days 2-5 07/06/13   Phill Mutter Dammen, PA-C   BP 133/68  Pulse 77  Temp(Src) 98.7 F (37.1 C) (Oral)  Resp 20  SpO2 98% Physical Exam  Nursing note and vitals reviewed. Constitutional: She appears  well-developed and well-nourished. No distress.  Eyes: Conjunctivae are normal.  Neck: Neck supple.  Musculoskeletal:  Right knee appears swollen. No erythema over the joint, not warm to the touch. Tender to the posterior knee joint, and over anterior and lateral joint. Pain with rom. Joint is stable with negative anterior and postrior drawer signs. No laxity with medial or lateral stress. No swelling in the lower leg or ankle.  Dorsal pedal pulses intact.  Neurological: She is alert.  5/5 and equal lower extremity strength. 2+ and equal patellar reflexes bilaterally. Pt able to dorsiflex bilateral toes and feet with good strength against resistance. Equal sensation bilaterally over thighs and lower legs.   Skin: Skin is warm and dry.    ED Course  Procedures (including critical care time) Labs Review Labs Reviewed - No data to display  Imaging Review Dg Knee Complete 4 Views Right  07/12/2013   CLINICAL DATA:  Right knee pain  EXAM: RIGHT KNEE - COMPLETE 4+ VIEW  COMPARISON:  None.  FINDINGS: Small joint effusion is identified. Mild irregularity is noted in the lateral tibial plateau which which is likely related to degenerative change. No definitive acute fracture is seen.  IMPRESSION: Small joint effusion.  Mild degenerative change without acute bony abnormality.   Electronically Signed   By: Alcide Clever M.D.   On: 07/12/2013 16:19   *PRELIMINARY RESULTS*  Vascular Ultrasound  Right lower extremity venous duplex has been completed. Preliminary findings: No evidence of DVT. 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  Glendale Chard RVT  07/12/2013, 3:50 PM     EKG Interpretation None      MDM   Final diagnoses:  Baker's cyst of knee  Right knee pain    Patient with a nontraumatic right knee swelling. No signs of infection. Patient is afebrile. X-rays showed small joint effusion otherwise normal. Venous Doppler obtained to rule out a blood clot, and is  negative except for a very large, 7 cm Baker's cyst. Will followup with primary care Dr. or orthopedic specialist. Underlying cause suspected possibly arthritis versus meniscal injury. I have also given her pain medications, Ace wrap. Instructed to keep it elevated and ice.  Filed Vitals:   07/12/13 1359 07/12/13 1412 07/12/13 1510  BP:  133/68   Pulse:  77   Temp:  99.3 F (37.4 C) 98.7 F (37.1 C)  TempSrc:  Oral Oral  Resp:  20   SpO2: 97% 98%        Lottie Mussel, PA-C 07/12/13 1719  Maricarmen Braziel A Cherita Hebel, PA-C 07/12/13 1719

## 2013-07-13 NOTE — ED Provider Notes (Signed)
Medical screening examination/treatment/procedure(s) were performed by non-physician practitioner and as supervising physician I was immediately available for consultation/collaboration.   EKG Interpretation None        William Donna Silverman, MD 07/13/13 0009 

## 2013-07-13 NOTE — ED Provider Notes (Signed)
Pharmacy called stating patient filled her normal, monthly rx for Hydrocodone #90 on 07/06/13, and now is there filling rx from ED for Percocet #20 - I advised pharmacist that pt should not need concurrent tx w both hydrocodone and oxycodone.  They will not fill the oxycodone, and recommend to pt that she go to pcp to discuss meds, that she should either take hydrocodone or oxycodone, but not both.     Suzi Roots, MD 07/13/13 1106

## 2013-07-19 ENCOUNTER — Emergency Department (HOSPITAL_COMMUNITY)
Admission: EM | Admit: 2013-07-19 | Discharge: 2013-07-19 | Disposition: A | Discharge status: Home / Self Care | Payer: Medicaid Other | Attending: Emergency Medicine | Admitting: Emergency Medicine

## 2013-07-19 ENCOUNTER — Encounter (HOSPITAL_COMMUNITY): Payer: Self-pay | Admitting: Emergency Medicine

## 2013-07-19 DIAGNOSIS — E119 Type 2 diabetes mellitus without complications: Secondary | ICD-10-CM | POA: Insufficient documentation

## 2013-07-19 DIAGNOSIS — Z8719 Personal history of other diseases of the digestive system: Secondary | ICD-10-CM | POA: Insufficient documentation

## 2013-07-19 DIAGNOSIS — M545 Low back pain, unspecified: Secondary | ICD-10-CM | POA: Insufficient documentation

## 2013-07-19 DIAGNOSIS — E0500 Thyrotoxicosis with diffuse goiter without thyrotoxic crisis or storm: Secondary | ICD-10-CM | POA: Insufficient documentation

## 2013-07-19 DIAGNOSIS — R011 Cardiac murmur, unspecified: Secondary | ICD-10-CM | POA: Insufficient documentation

## 2013-07-19 DIAGNOSIS — F411 Generalized anxiety disorder: Secondary | ICD-10-CM | POA: Insufficient documentation

## 2013-07-19 DIAGNOSIS — Z8619 Personal history of other infectious and parasitic diseases: Secondary | ICD-10-CM | POA: Insufficient documentation

## 2013-07-19 DIAGNOSIS — Z9889 Other specified postprocedural states: Secondary | ICD-10-CM | POA: Insufficient documentation

## 2013-07-19 DIAGNOSIS — I10 Essential (primary) hypertension: Secondary | ICD-10-CM | POA: Insufficient documentation

## 2013-07-19 DIAGNOSIS — Z79899 Other long term (current) drug therapy: Secondary | ICD-10-CM | POA: Insufficient documentation

## 2013-07-19 DIAGNOSIS — F17200 Nicotine dependence, unspecified, uncomplicated: Secondary | ICD-10-CM | POA: Insufficient documentation

## 2013-07-19 DIAGNOSIS — J449 Chronic obstructive pulmonary disease, unspecified: Secondary | ICD-10-CM | POA: Insufficient documentation

## 2013-07-19 DIAGNOSIS — Z880 Allergy status to penicillin: Secondary | ICD-10-CM | POA: Insufficient documentation

## 2013-07-19 DIAGNOSIS — M549 Dorsalgia, unspecified: Secondary | ICD-10-CM

## 2013-07-19 DIAGNOSIS — E785 Hyperlipidemia, unspecified: Secondary | ICD-10-CM | POA: Insufficient documentation

## 2013-07-19 DIAGNOSIS — F3289 Other specified depressive episodes: Secondary | ICD-10-CM | POA: Insufficient documentation

## 2013-07-19 DIAGNOSIS — J4489 Other specified chronic obstructive pulmonary disease: Secondary | ICD-10-CM | POA: Insufficient documentation

## 2013-07-19 DIAGNOSIS — Z8742 Personal history of other diseases of the female genital tract: Secondary | ICD-10-CM | POA: Insufficient documentation

## 2013-07-19 DIAGNOSIS — G8929 Other chronic pain: Secondary | ICD-10-CM | POA: Insufficient documentation

## 2013-07-19 DIAGNOSIS — M069 Rheumatoid arthritis, unspecified: Secondary | ICD-10-CM | POA: Insufficient documentation

## 2013-07-19 DIAGNOSIS — Z791 Long term (current) use of non-steroidal anti-inflammatories (NSAID): Secondary | ICD-10-CM | POA: Insufficient documentation

## 2013-07-19 DIAGNOSIS — F329 Major depressive disorder, single episode, unspecified: Secondary | ICD-10-CM | POA: Insufficient documentation

## 2013-07-19 DIAGNOSIS — IMO0002 Reserved for concepts with insufficient information to code with codable children: Secondary | ICD-10-CM | POA: Insufficient documentation

## 2013-07-19 MED ORDER — CYCLOBENZAPRINE HCL 10 MG PO TABS
10.0000 mg | ORAL_TABLET | Freq: Once | ORAL | Status: AC
  Administered 2013-07-19: 10 mg via ORAL
  Filled 2013-07-19: qty 1

## 2013-07-19 MED ORDER — CYCLOBENZAPRINE HCL 10 MG PO TABS: 10.0000 mg | ORAL_TABLET | Freq: Two times a day (BID) | ORAL | Status: DC | PRN

## 2013-07-19 NOTE — ED Notes (Signed)
PT given pain meds at D/C

## 2013-07-19 NOTE — ED Provider Notes (Signed)
CSN: [id number]     Arrival date & time 07/19/13  1148 History   First MD Initiated Contact with Patient 07/19/13 1208     Chief Complaint  Patient presents with  . Back Pain     (Consider location/radiation/quality/duration/timing/severity/associated sxs/prior Treatment) HPI Comments: Patient is a 58 year old female with a past medical history of chronic back pain who presents with worsening back pain for the past 3 days. The pain is aching and severe and does not radiate. The pain is constant. Movement makes the pain worse. Nothing makes the pain better. Patient takes percocet regularly for chronic pain but her back continues to hurt. No associated symptoms. No saddles paresthesias or bladder/bowel incontinence. She has a PCP but has not discussed her back pain with her doctor.      Past Medical History  Diagnosis Date  . Hypertension   . Heart murmur   . Asthma   . Emphysema   . Diabetes mellitus   . Hyperlipidemia   . Allergic rhinitis   . Chronic headache   . OSA (obstructive sleep apnea)   . Neuropathy   . Grave's disease   . Herniated disc   . COPD (chronic obstructive pulmonary disease)   . Sleep apnea   . Menopause   . Hernia, umbilical   . Anxiety   . Depression   . Back pain, chronic   . Shingles   . Herniated disc   . Arthritis     rheumatoid  . 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 Generalized anxiety disorder 05/26/2012  . Chronic back pain 05/27/2012    Secondary to L4-5 herniated disc per patient   Past Surgical History  Procedure Laterality Date  . Cholecystectomy    . Tubal ligation    . Laparoscopy    . Foot surgery    . Eye surgery     Family History  Problem Relation Age of Onset  . Emphysema Mother   . Allergies Mother   . Allergies Daughter   . Asthma Mother   . Asthma Brother   . Heart disease Mother   . Rheum arthritis Mother   . Breast cancer Other     aunt  . Lung  cancer Brother   . Throat cancer Brother    History  Substance Use Topics  . Smoking status: Current Every Day Smoker -- 1.00 packs/day for 40 years    Types: Cigarettes  . Smokeless tobacco: Never Used  . Alcohol Use: No   OB History   Grav Para Term Preterm Abortions TAB SAB Ect Mult Living   5 3   2  2   3      Review of Systems  Constitutional: Negative for fever, chills and fatigue.  HENT: Negative for trouble swallowing.   Eyes: Negative for visual disturbance.  Respiratory: Negative for shortness of breath.   Cardiovascular: Negative for chest pain and palpitations.  Gastrointestinal: Negative for nausea, vomiting, abdominal pain and diarrhea.  Genitourinary: Negative for dysuria and difficulty urinating.  Musculoskeletal: Positive for back pain. Negative for arthralgias and neck pain.  Skin: Negative for color change.  Neurological: Negative for dizziness and weakness.  Psychiatric/Behavioral: Negative for dysphoric mood.      Allergies  Ciprofloxacin and Penicillins  Home Medications   Prior to Admission medications   Medication Sig Start Date End Date Taking? Authorizing Provider  acyclovir (ZOVIRAX) 400 MG tablet Take 800 mg  by mouth every morning.    Yes Historical Provider, MD  albuterol (PROAIR HFA) 108 (90 BASE) MCG/ACT inhaler Inhale 2 puffs into the lungs 4 (four) times daily. For COPD   Yes Historical Provider, MD  atorvastatin (LIPITOR) 40 MG tablet Take 1 tablet (40 mg total) by mouth daily. 10/06/12  Yes Carlynn Purl, DO  beclomethasone (QVAR) 80 MCG/ACT inhaler Inhale 2 puffs into the lungs 2 (two) times daily.    Yes Historical Provider, MD  benzonatate (TESSALON) 100 MG capsule Take 1 capsule (100 mg total) by mouth every 8 (eight) hours. 07/06/13  Yes Peter S Dammen, PA-C  busPIRone (BUSPAR) 10 MG tablet Take 20 mg by mouth 3 (three) times daily.    Yes Historical Provider, MD  diclofenac sodium (VOLTAREN) 1 % GEL Apply topically 4 (four) times daily.    Yes Historical Provider, MD  FLUoxetine (PROZAC) 20 MG capsule Take 60 mg by mouth every morning.    Yes Historical Provider, MD  glipiZIDE (GLUCOTROL XL) 10 MG 24 hr tablet Take 10 mg by mouth 2 (two) times daily.   Yes Historical Provider, MD  hydrochlorothiazide (HYDRODIURIL) 25 MG tablet Take 25 mg by mouth every morning.    Yes Historical Provider, MD  HYDROcodone-acetaminophen (NORCO) 10-325 MG per tablet Take 1 tablet by mouth every 6 (six) hours as needed for moderate pain.    Yes Historical Provider, MD  ipratropium-albuterol (DUONEB) 0.5-2.5 (3) MG/3ML SOLN Take 3 mLs by nebulization every 6 (six) hours as needed (Asthma).   Yes Historical Provider, MD  levothyroxine (SYNTHROID, LEVOTHROID) 175 MCG tablet Take 175 mcg by mouth every morning.    Yes Historical Provider, MD  lisinopril (PRINIVIL,ZESTRIL) 10 MG tablet Take 10 mg by mouth daily.   Yes Historical Provider, MD  meloxicam (MOBIC) 15 MG tablet Take 15 mg by mouth daily.   Yes Historical Provider, MD  metFORMIN (GLUCOPHAGE) 1000 MG tablet Take 1,000 mg by mouth 2 (two) times daily.    Yes Historical Provider, MD  mirtazapine (REMERON) 45 MG tablet Take 45 mg by mouth at bedtime as needed (Sleep). For insomnia   Yes Historical Provider, MD  naproxen (NAPROSYN) 500 MG tablet Take 1 tablet (500 mg total) by mouth 2 (two) times daily. 07/12/13  Yes Tatyana A Kirichenko, PA-C  pregabalin (LYRICA) 25 MG capsule Take 75 mg by mouth 3 (three) times daily.    Yes Historical Provider, MD  QUEtiapine (SEROQUEL) 25 MG tablet Take 25 mg by mouth at bedtime.   Yes Historical Provider, MD  traMADol (ULTRAM) 50 MG tablet Take 50 mg by mouth every 6 (six) hours as needed for pain.   Yes Historical Provider, MD  verapamil (CALAN) 40 MG tablet Take 40 mg by mouth 3 (three) times daily.   Yes Historical Provider, MD  cyclobenzaprine (FLEXERIL) 10 MG tablet Take 1 tablet (10 mg total) by mouth 2 (two) times daily as needed for muscle spasms. 07/19/13    Emilia Beck, PA-C  oxyCODONE-acetaminophen (PERCOCET/ROXICET) 5-325 MG per tablet Take 1 tablet by mouth every 4 (four) hours as needed for severe pain. 07/12/13   Tatyana A Kirichenko, PA-C   BP 152/83  Pulse 81  Temp(Src) 99.1 F (37.3 C) (Oral)  Resp 18  SpO2 96% Physical Exam  Nursing note and vitals reviewed. Constitutional: She appears well-developed and well-nourished. No distress.  HENT:  Head: Normocephalic and atraumatic.  Eyes: Conjunctivae are normal.  Neck: Normal range of motion.  Cardiovascular: Normal rate and regular rhythm.  Exam reveals no gallop and no friction rub.   No murmur heard. Pulmonary/Chest: Effort normal and breath sounds normal. She has no wheezes. She has no rales. She exhibits no tenderness.  Musculoskeletal: Normal range of motion.  No midline spine tenderness to palpation. Bilateral paraspinal lumbar tenderness to palpation.   Neurological: She is alert.  Lower extremity strength and sensation equal and intact bilaterally. Speech is goal-oriented. Moves limbs without ataxia.   Skin: Skin is warm and dry.  Psychiatric: She has a normal mood and affect. Her behavior is normal.    ED Course  Procedures (including critical care time) Labs Review Labs Reviewed - No data to display  Imaging Review No results found.   EKG Interpretation None      MDM   Final diagnoses:  Chronic back pain    1:40 PM Patient has no new injury requiring imaging. Patient has muscle pain of lumbar spine. Patient will have flexeril for pain. Patient is prescribed Percocet for chronic pain. No bladder/bowel incontinence or saddle paresthesias. Vitals stable and patient afebrile.     Emilia Beck, PA-C 07/19/13 1442

## 2013-07-19 NOTE — ED Notes (Signed)
Pt c/o lower back pain. States "my discs are bulging, you can see them". Hx of chronic back pain.

## 2013-07-19 NOTE — ED Notes (Signed)
Pt presents to department for evaluation of back pain. States chronic issues with same. 10/10 pain at the time. Pt is alert and oriented x4. States pain became worse on Monday.

## 2013-07-19 NOTE — ED Provider Notes (Signed)
Medical screening examination/treatment/procedure(s) were performed by non-physician practitioner and as supervising physician I was immediately available for consultation/collaboration.   EKG Interpretation None        Laray Anger, DO 07/19/13 [phone]

## 2013-07-19 NOTE — Discharge Instructions (Signed)
Follow up with your doctor for further evaluation of your pain. Take Flexeril as needed for pain.

## 2013-07-23 ENCOUNTER — Observation Stay (HOSPITAL_COMMUNITY)
Admission: EM | Admit: 2013-07-23 | Discharge: 2013-07-25 | Disposition: A | Discharge status: Home / Self Care | Payer: Medicaid Other | Attending: Internal Medicine | Admitting: Internal Medicine

## 2013-07-23 ENCOUNTER — Encounter (HOSPITAL_COMMUNITY): Payer: Self-pay | Admitting: Emergency Medicine

## 2013-07-23 ENCOUNTER — Emergency Department (HOSPITAL_COMMUNITY): Payer: Medicaid Other

## 2013-07-23 DIAGNOSIS — E119 Type 2 diabetes mellitus without complications: Secondary | ICD-10-CM

## 2013-07-23 DIAGNOSIS — E890 Postprocedural hypothyroidism: Secondary | ICD-10-CM | POA: Insufficient documentation

## 2013-07-23 DIAGNOSIS — N183 Chronic kidney disease, stage 3 unspecified: Secondary | ICD-10-CM | POA: Insufficient documentation

## 2013-07-23 DIAGNOSIS — N189 Chronic kidney disease, unspecified: Secondary | ICD-10-CM | POA: Diagnosis present

## 2013-07-23 DIAGNOSIS — F411 Generalized anxiety disorder: Secondary | ICD-10-CM

## 2013-07-23 DIAGNOSIS — F3289 Other specified depressive episodes: Secondary | ICD-10-CM | POA: Insufficient documentation

## 2013-07-23 DIAGNOSIS — R079 Chest pain, unspecified: Secondary | ICD-10-CM

## 2013-07-23 DIAGNOSIS — R631 Polydipsia: Secondary | ICD-10-CM

## 2013-07-23 DIAGNOSIS — N179 Acute kidney failure, unspecified: Secondary | ICD-10-CM

## 2013-07-23 DIAGNOSIS — I129 Hypertensive chronic kidney disease with stage 1 through stage 4 chronic kidney disease, or unspecified chronic kidney disease: Secondary | ICD-10-CM | POA: Insufficient documentation

## 2013-07-23 DIAGNOSIS — R0789 Other chest pain: Principal | ICD-10-CM | POA: Insufficient documentation

## 2013-07-23 DIAGNOSIS — R471 Dysarthria and anarthria: Secondary | ICD-10-CM

## 2013-07-23 DIAGNOSIS — M5430 Sciatica, unspecified side: Secondary | ICD-10-CM | POA: Insufficient documentation

## 2013-07-23 DIAGNOSIS — R0602 Shortness of breath: Secondary | ICD-10-CM

## 2013-07-23 DIAGNOSIS — E785 Hyperlipidemia, unspecified: Secondary | ICD-10-CM

## 2013-07-23 DIAGNOSIS — R011 Cardiac murmur, unspecified: Secondary | ICD-10-CM | POA: Insufficient documentation

## 2013-07-23 DIAGNOSIS — E039 Hypothyroidism, unspecified: Secondary | ICD-10-CM

## 2013-07-23 DIAGNOSIS — F329 Major depressive disorder, single episode, unspecified: Secondary | ICD-10-CM

## 2013-07-23 DIAGNOSIS — J449 Chronic obstructive pulmonary disease, unspecified: Secondary | ICD-10-CM | POA: Insufficient documentation

## 2013-07-23 DIAGNOSIS — G8929 Other chronic pain: Secondary | ICD-10-CM

## 2013-07-23 DIAGNOSIS — Z9089 Acquired absence of other organs: Secondary | ICD-10-CM | POA: Insufficient documentation

## 2013-07-23 DIAGNOSIS — M069 Rheumatoid arthritis, unspecified: Secondary | ICD-10-CM | POA: Insufficient documentation

## 2013-07-23 DIAGNOSIS — R197 Diarrhea, unspecified: Secondary | ICD-10-CM

## 2013-07-23 DIAGNOSIS — J438 Other emphysema: Secondary | ICD-10-CM | POA: Insufficient documentation

## 2013-07-23 DIAGNOSIS — M545 Low back pain, unspecified: Secondary | ICD-10-CM | POA: Insufficient documentation

## 2013-07-23 DIAGNOSIS — F32A Depression, unspecified: Secondary | ICD-10-CM

## 2013-07-23 DIAGNOSIS — J4489 Other specified chronic obstructive pulmonary disease: Secondary | ICD-10-CM | POA: Insufficient documentation

## 2013-07-23 DIAGNOSIS — G4733 Obstructive sleep apnea (adult) (pediatric): Secondary | ICD-10-CM

## 2013-07-23 DIAGNOSIS — M549 Dorsalgia, unspecified: Secondary | ICD-10-CM

## 2013-07-23 DIAGNOSIS — E0500 Thyrotoxicosis with diffuse goiter without thyrotoxic crisis or storm: Secondary | ICD-10-CM

## 2013-07-23 DIAGNOSIS — F17200 Nicotine dependence, unspecified, uncomplicated: Secondary | ICD-10-CM | POA: Insufficient documentation

## 2013-07-23 DIAGNOSIS — K219 Gastro-esophageal reflux disease without esophagitis: Secondary | ICD-10-CM

## 2013-07-23 LAB — BASIC METABOLIC PANEL
BUN: 13 mg/dL (ref 6–23)
CO2: 29 mEq/L (ref 19–32)
Calcium: 9.5 mg/dL (ref 8.4–10.5)
Chloride: 93 mEq/L — ABNORMAL LOW (ref 96–112)
Creatinine, Ser: 1.58 mg/dL — ABNORMAL HIGH (ref 0.50–1.10)
GFR calc Af Amer: 41 mL/min — ABNORMAL LOW (ref >90)
GFR calc non Af Amer: 36 mL/min — ABNORMAL LOW (ref >90)
Glucose, Bld: 107 mg/dL — ABNORMAL HIGH (ref 70–99)
Potassium: 4.5 mEq/L (ref 3.7–5.3)
Sodium: 135 mEq/L — ABNORMAL LOW (ref 137–147)

## 2013-07-23 LAB — CBC
HCT: 35.9 % — ABNORMAL LOW (ref 36.0–46.0)
Hemoglobin: 12.2 g/dL (ref 12.0–15.0)
MCH: 29.5 pg (ref 26.0–34.0)
MCHC: 34 g/dL (ref 30.0–36.0)
MCV: 86.9 fL (ref 78.0–100.0)
Platelets: 226 10*3/uL (ref 150–400)
RBC: 4.13 MIL/uL (ref 3.87–5.11)
RDW: 13.9 % (ref 11.5–15.5)
WBC: 16.9 10*3/uL — ABNORMAL HIGH (ref 4.0–10.5)

## 2013-07-23 LAB — URINALYSIS, ROUTINE W REFLEX MICROSCOPIC
Bilirubin Urine: NEGATIVE
Glucose, UA: NEGATIVE mg/dL
Hgb urine dipstick: NEGATIVE
Ketones, ur: NEGATIVE mg/dL
Leukocytes, UA: NEGATIVE
Nitrite: NEGATIVE
Protein, ur: NEGATIVE mg/dL
Specific Gravity, Urine: 1.004 — ABNORMAL LOW (ref 1.005–1.030)
Urobilinogen, UA: 0.2 mg/dL (ref 0.0–1.0)
pH: 6.5 (ref 5.0–8.0)

## 2013-07-23 LAB — TROPONIN I: Troponin I: <0.3 ng/mL (ref <0.30)

## 2013-07-23 LAB — CBG MONITORING, ED: Glucose-Capillary: 103 mg/dL — ABNORMAL HIGH (ref 70–99)

## 2013-07-23 LAB — PHOSPHORUS: PHOSPHORUS: 5.3 mg/dL — AB (ref 2.3–4.6)

## 2013-07-23 MED ORDER — LEVOTHYROXINE SODIUM 175 MCG PO TABS
175.0000 ug | ORAL_TABLET | Freq: Every day | ORAL | Status: DC
  Administered 2013-07-24 – 2013-07-25 (×2): 175 ug via ORAL
  Filled 2013-07-23 (×3): qty 1

## 2013-07-23 MED ORDER — MIRTAZAPINE 15 MG PO TABS
15.0000 mg | ORAL_TABLET | Freq: Every day | ORAL | Status: DC
  Administered 2013-07-23 – 2013-07-24 (×2): 15 mg via ORAL
  Filled 2013-07-23 (×3): qty 1

## 2013-07-23 MED ORDER — ACETAMINOPHEN 325 MG PO TABS: 650.0000 mg | ORAL_TABLET | Freq: Four times a day (QID) | ORAL | Status: DC | PRN

## 2013-07-23 MED ORDER — ONDANSETRON HCL 4 MG/2ML IJ SOLN
4.0000 mg | Freq: Four times a day (QID) | INTRAMUSCULAR | Status: DC | PRN
  Filled 2013-07-23: qty 2

## 2013-07-23 MED ORDER — PANTOPRAZOLE SODIUM 40 MG PO TBEC
40.0000 mg | DELAYED_RELEASE_TABLET | Freq: Two times a day (BID) | ORAL | Status: DC
  Administered 2013-07-23 – 2013-07-25 (×4): 40 mg via ORAL
  Filled 2013-07-23 (×5): qty 1

## 2013-07-23 MED ORDER — MORPHINE SULFATE 4 MG/ML IJ SOLN
3.0000 mg | INTRAMUSCULAR | Status: DC | PRN
  Administered 2013-07-24 (×2): 3 mg via INTRAVENOUS
  Filled 2013-07-23 (×2): qty 1

## 2013-07-23 MED ORDER — BUSPIRONE HCL 10 MG PO TABS
20.0000 mg | ORAL_TABLET | Freq: Three times a day (TID) | ORAL | Status: DC
  Administered 2013-07-23 – 2013-07-25 (×5): 20 mg via ORAL
  Filled 2013-07-23 (×7): qty 2

## 2013-07-23 MED ORDER — ACETAMINOPHEN 650 MG RE SUPP: 650.0000 mg | Freq: Four times a day (QID) | RECTAL | Status: DC | PRN

## 2013-07-23 MED ORDER — MORPHINE SULFATE 4 MG/ML IJ SOLN
4.0000 mg | Freq: Once | INTRAMUSCULAR | Status: AC
  Administered 2013-07-23: 4 mg via INTRAVENOUS
  Filled 2013-07-23: qty 1

## 2013-07-23 MED ORDER — HEPARIN SODIUM (PORCINE) 5000 UNIT/ML IJ SOLN
5000.0000 [IU] | Freq: Three times a day (TID) | INTRAMUSCULAR | Status: DC
  Administered 2013-07-23 – 2013-07-25 (×5): 5000 [IU] via SUBCUTANEOUS
  Filled 2013-07-23 (×8): qty 1

## 2013-07-23 MED ORDER — CARVEDILOL 6.25 MG PO TABS
6.2500 mg | ORAL_TABLET | Freq: Two times a day (BID) | ORAL | Status: DC
  Administered 2013-07-23 – 2013-07-24 (×2): 6.25 mg via ORAL
  Filled 2013-07-23 (×4): qty 1

## 2013-07-23 MED ORDER — LORATADINE 10 MG PO TABS
10.0000 mg | ORAL_TABLET | Freq: Every day | ORAL | Status: DC | PRN
  Administered 2013-07-25: 10 mg via ORAL
  Filled 2013-07-23 (×2): qty 1

## 2013-07-23 MED ORDER — IPRATROPIUM-ALBUTEROL 0.5-2.5 (3) MG/3ML IN SOLN
3.0000 mL | Freq: Four times a day (QID) | RESPIRATORY_TRACT | Status: DC | PRN
  Administered 2013-07-25: 3 mL via RESPIRATORY_TRACT
  Filled 2013-07-23: qty 3

## 2013-07-23 MED ORDER — VERAPAMIL HCL 40 MG PO TABS
40.0000 mg | ORAL_TABLET | Freq: Three times a day (TID) | ORAL | Status: DC
  Administered 2013-07-23 – 2013-07-25 (×5): 40 mg via ORAL
  Filled 2013-07-23 (×7): qty 1

## 2013-07-23 MED ORDER — ATORVASTATIN CALCIUM 40 MG PO TABS
40.0000 mg | ORAL_TABLET | Freq: Every day | ORAL | Status: DC
  Administered 2013-07-24: 40 mg via ORAL
  Filled 2013-07-23: qty 1

## 2013-07-23 MED ORDER — POLYETHYLENE GLYCOL 3350 17 G PO PACK
17.0000 g | PACK | Freq: Every day | ORAL | Status: DC
  Administered 2013-07-24 – 2013-07-25 (×2): 17 g via ORAL
  Filled 2013-07-23 (×2): qty 1

## 2013-07-23 MED ORDER — OXYCODONE-ACETAMINOPHEN 5-325 MG PO TABS
1.0000 | ORAL_TABLET | ORAL | Status: DC | PRN
  Administered 2013-07-23 – 2013-07-25 (×5): 1 via ORAL
  Filled 2013-07-23 (×5): qty 1

## 2013-07-23 MED ORDER — INSULIN ASPART 100 UNIT/ML ~~LOC~~ SOLN: 0.0000 [IU] | Freq: Every day | SUBCUTANEOUS | Status: DC

## 2013-07-23 MED ORDER — CYCLOBENZAPRINE HCL 5 MG PO TABS
5.0000 mg | ORAL_TABLET | Freq: Two times a day (BID) | ORAL | Status: DC
  Administered 2013-07-23 – 2013-07-24 (×2): 5 mg via ORAL
  Filled 2013-07-23 (×3): qty 1

## 2013-07-23 MED ORDER — ONDANSETRON HCL 4 MG PO TABS: 4.0000 mg | ORAL_TABLET | Freq: Four times a day (QID) | ORAL | Status: DC | PRN

## 2013-07-23 MED ORDER — FLUTICASONE PROPIONATE HFA 44 MCG/ACT IN AERO
2.0000 | INHALATION_SPRAY | Freq: Two times a day (BID) | RESPIRATORY_TRACT | Status: DC
  Administered 2013-07-23 – 2013-07-25 (×4): 2 via RESPIRATORY_TRACT
  Filled 2013-07-23: qty 10.6

## 2013-07-23 MED ORDER — DICLOFENAC SODIUM 1 % TD GEL
2.0000 g | Freq: Four times a day (QID) | TRANSDERMAL | Status: DC
  Administered 2013-07-23 – 2013-07-25 (×6): 2 g via TOPICAL
  Filled 2013-07-23: qty 100

## 2013-07-23 MED ORDER — CYCLOBENZAPRINE HCL 10 MG PO TABS
10.0000 mg | ORAL_TABLET | Freq: Once | ORAL | Status: AC
  Administered 2013-07-23: 10 mg via ORAL
  Filled 2013-07-23: qty 1

## 2013-07-23 MED ORDER — QUETIAPINE FUMARATE 25 MG PO TABS
25.0000 mg | ORAL_TABLET | Freq: Every day | ORAL | Status: DC
  Administered 2013-07-23 – 2013-07-24 (×2): 25 mg via ORAL
  Filled 2013-07-23 (×3): qty 1

## 2013-07-23 MED ORDER — SODIUM CHLORIDE 0.9 % IV SOLN
INTRAVENOUS | Status: DC
  Administered 2013-07-23: 23:00:00 via INTRAVENOUS

## 2013-07-23 MED ORDER — FLUOXETINE HCL 20 MG PO CAPS
60.0000 mg | ORAL_CAPSULE | Freq: Every morning | ORAL | Status: DC
  Administered 2013-07-24 – 2013-07-25 (×2): 60 mg via ORAL
  Filled 2013-07-23 (×2): qty 3

## 2013-07-23 MED ORDER — SODIUM CHLORIDE 0.9 % IJ SOLN
3.0000 mL | Freq: Two times a day (BID) | INTRAMUSCULAR | Status: DC
  Administered 2013-07-24 – 2013-07-25 (×2): 3 mL via INTRAVENOUS

## 2013-07-23 MED ORDER — INSULIN ASPART 100 UNIT/ML ~~LOC~~ SOLN
0.0000 [IU] | Freq: Three times a day (TID) | SUBCUTANEOUS | Status: DC
  Administered 2013-07-24: 2 [IU] via SUBCUTANEOUS
  Administered 2013-07-25: 1 [IU] via SUBCUTANEOUS
  Administered 2013-07-25: 3 [IU] via SUBCUTANEOUS

## 2013-07-23 MED ORDER — ACYCLOVIR 400 MG PO TABS
400.0000 mg | ORAL_TABLET | Freq: Every morning | ORAL | Status: DC
  Administered 2013-07-24 – 2013-07-25 (×2): 400 mg via ORAL
  Filled 2013-07-23 (×2): qty 1

## 2013-07-23 MED ORDER — ASPIRIN 81 MG PO CHEW
324.0000 mg | CHEWABLE_TABLET | Freq: Once | ORAL | Status: AC
  Administered 2013-07-23: 324 mg via ORAL
  Filled 2013-07-23: qty 4

## 2013-07-23 MED ORDER — GABAPENTIN 100 MG PO CAPS
100.0000 mg | ORAL_CAPSULE | Freq: Three times a day (TID) | ORAL | Status: DC
  Administered 2013-07-23: 100 mg via ORAL
  Filled 2013-07-23 (×4): qty 1

## 2013-07-23 NOTE — ED Notes (Signed)
Pt aware of the need for a urine sample. Pt unable to void at this time. 

## 2013-07-23 NOTE — ED Provider Notes (Signed)
Medical screening examination/treatment/procedure(s) were conducted as a shared visit with non-physician practitioner(s) and myself.  I personally evaluated the patient during the encounter.   EKG Interpretation   Date/Time:  Sunday July 23 2013 17:56:03 EDT Ventricular Rate:  96 PR Interval:  146 QRS Duration: 86 QT Interval:  382 QTC Calculation: 483 R Axis:     Text Interpretation:  Sinus rhythm Anteroseptal infarct, old No  significant change since last tracing Confirmed by Ethelda Chick  MD, Parish Dubose  [phone]) on 07/23/2013 7:36:00 PM       Doug Sou, MD 07/23/13 2303

## 2013-07-23 NOTE — ED Provider Notes (Signed)
Component of low back pain nonradiating for several days. She also reports polydipsia and chest pain anterior nonradiating last night lasting 2 hours accompanied by shortness of breath. No other associated symptoms. Presently complaining low back pain. No other associated symptoms. On exam morbidly obese nontoxic alert lungs clear auscultation heart regular rate and rhythm abdomen obese, nontender extremities without edema back without point tenderness or flank tenderness skin warm dry  Doug Sou, MD 07/23/13 [phone]

## 2013-07-23 NOTE — ED Provider Notes (Signed)
CSN: [id number]     Arrival date & time 07/23/13  1748 History   First MD Initiated Contact with Patient 07/23/13 1755     Chief Complaint  Patient presents with  . Chest Pain  . Shortness of Breath  . Polydipsia     (Consider location/radiation/quality/duration/timing/severity/associated sxs/prior Treatment) HPI Pt is a 58yo female with hx of HTN, COPD, NIDDM, Grave's disease, anxiety, and chronic back pain brought to ED via EMS c/o chest pain, associated with SOB, cough and excessive thirst x 3 days. Pt reports drinking 24 bottles of water over last 3 days but still thirsty. Pt also c/o lower back pain that "feels like it is squeezing and pushing my stomach out." pt c/o stomach distension.  States her weight fluctuates but "this is the biggest I've been in a while."  Chest pain is a intermittent burning sensation, states it feels like her throat is closing up but denies known allergies, rash. Denies fever, n/v/d. Denies sick contacts or recent travel.       Past Medical History  Diagnosis Date  . Hypertension   . Heart murmur   . Asthma   . Emphysema   . Diabetes mellitus   . Hyperlipidemia   . Allergic rhinitis   . Chronic headache   . OSA (obstructive sleep apnea)   . Neuropathy   . Grave's disease   . Herniated disc   . COPD (chronic obstructive pulmonary disease)   . Sleep apnea   . Menopause   . Hernia, umbilical   . Anxiety   . Depression   . Back pain, chronic   . Shingles   . Herniated disc   . Arthritis     rheumatoid  . 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 Generalized anxiety disorder 05/26/2012  . Chronic back pain 05/27/2012    Secondary to L4-5 herniated disc per patient   Past Surgical History  Procedure Laterality Date  . Cholecystectomy    . Tubal ligation    . Laparoscopy    . Foot surgery    . Eye surgery     Family History  Problem Relation Age of Onset  . Emphysema Mother   .  Allergies Mother   . Allergies Daughter   . Asthma Mother   . Asthma Brother   . Heart disease Mother   . Rheum arthritis Mother   . Breast cancer Other     aunt  . Lung cancer Brother   . Throat cancer Brother    History  Substance Use Topics  . Smoking status: Current Every Day Smoker -- 1.00 packs/day for 40 years    Types: Cigarettes  . Smokeless tobacco: Never Used  . Alcohol Use: No   OB History   Grav Para Term Preterm Abortions TAB SAB Ect Mult Living   5 3   2  2   3      Review of Systems  Constitutional: Negative for fever and chills.  Respiratory: Positive for shortness of breath. Negative for cough.   Cardiovascular: Positive for chest pain. Negative for palpitations and leg swelling.  Gastrointestinal: Positive for abdominal pain and abdominal distention. Negative for nausea, vomiting and diarrhea.  Endocrine: Positive for polydipsia, polyphagia and polyuria.  Genitourinary: Positive for frequency. Negative for dysuria, urgency, flank pain, decreased urine volume, vaginal discharge, vaginal pain and pelvic pain.  Musculoskeletal: Positive for back pain and myalgias.  All other systems reviewed  and are negative.     Allergies  Ciprofloxacin and Penicillins  Home Medications   Prior to Admission medications   Medication Sig Start Date End Date Taking? Authorizing Provider  acyclovir (ZOVIRAX) 400 MG tablet Take 800 mg by mouth every morning.     Historical Provider, MD  albuterol (PROAIR HFA) 108 (90 BASE) MCG/ACT inhaler Inhale 2 puffs into the lungs 4 (four) times daily. For COPD    Historical Provider, MD  atorvastatin (LIPITOR) 40 MG tablet Take 1 tablet (40 mg total) by mouth daily. 10/06/12   Carlynn Purl, DO  beclomethasone (QVAR) 80 MCG/ACT inhaler Inhale 2 puffs into the lungs 2 (two) times daily.     Historical Provider, MD  benzonatate (TESSALON) 100 MG capsule Take 1 capsule (100 mg total) by mouth every 8 (eight) hours. 07/06/13   Phill Mutter Dammen,  PA-C  busPIRone (BUSPAR) 10 MG tablet Take 20 mg by mouth 3 (three) times daily.     Historical Provider, MD  cyclobenzaprine (FLEXERIL) 10 MG tablet Take 1 tablet (10 mg total) by mouth 2 (two) times daily as needed for muscle spasms. 07/19/13   Kaitlyn Szekalski, PA-C  diclofenac sodium (VOLTAREN) 1 % GEL Apply topically 4 (four) times daily.    Historical Provider, MD  FLUoxetine (PROZAC) 20 MG capsule Take 60 mg by mouth every morning.     Historical Provider, MD  glipiZIDE (GLUCOTROL XL) 10 MG 24 hr tablet Take 10 mg by mouth 2 (two) times daily.    Historical Provider, MD  hydrochlorothiazide (HYDRODIURIL) 25 MG tablet Take 25 mg by mouth every morning.     Historical Provider, MD  HYDROcodone-acetaminophen (NORCO) 10-325 MG per tablet Take 1 tablet by mouth every 6 (six) hours as needed for moderate pain.     Historical Provider, MD  ipratropium-albuterol (DUONEB) 0.5-2.5 (3) MG/3ML SOLN Take 3 mLs by nebulization every 6 (six) hours as needed (Asthma).    Historical Provider, MD  levothyroxine (SYNTHROID, LEVOTHROID) 175 MCG tablet Take 175 mcg by mouth every morning.     Historical Provider, MD  lisinopril (PRINIVIL,ZESTRIL) 10 MG tablet Take 10 mg by mouth daily.    Historical Provider, MD  meloxicam (MOBIC) 15 MG tablet Take 15 mg by mouth daily.    Historical Provider, MD  metFORMIN (GLUCOPHAGE) 1000 MG tablet Take 1,000 mg by mouth 2 (two) times daily.     Historical Provider, MD  mirtazapine (REMERON) 45 MG tablet Take 45 mg by mouth at bedtime as needed (Sleep). For insomnia    Historical Provider, MD  naproxen (NAPROSYN) 500 MG tablet Take 1 tablet (500 mg total) by mouth 2 (two) times daily. 07/12/13   Tatyana A Kirichenko, PA-C  oxyCODONE-acetaminophen (PERCOCET/ROXICET) 5-325 MG per tablet Take 1 tablet by mouth every 4 (four) hours as needed for severe pain. 07/12/13   Tatyana A Kirichenko, PA-C  pregabalin (LYRICA) 25 MG capsule Take 75 mg by mouth 3 (three) times daily.      Historical Provider, MD  QUEtiapine (SEROQUEL) 25 MG tablet Take 25 mg by mouth at bedtime.    Historical Provider, MD  traMADol (ULTRAM) 50 MG tablet Take 50 mg by mouth every 6 (six) hours as needed for pain.    Historical Provider, MD  verapamil (CALAN) 40 MG tablet Take 40 mg by mouth 3 (three) times daily.    Historical Provider, MD   BP 123/63  Pulse 93  Temp(Src) 98.7 F (37.1 C) (Oral)  Resp 17  SpO2 94%  Physical Exam  Nursing note and vitals reviewed. Constitutional: She is oriented to person, place, and time. She appears well-developed and well-nourished.  HENT:  Head: Normocephalic and atraumatic.  Eyes: Conjunctivae are normal. No scleral icterus.  Neck: Normal range of motion. Neck supple.  Cardiovascular: Normal rate, regular rhythm and normal heart sounds.   Pulmonary/Chest: Breath sounds normal. She is in respiratory distress ( mild). She has no wheezes. She has no rales. She exhibits no tenderness.  Mild respiratory distress, short winded between sentences. Lungs: CTAB  Abdominal: Soft. Bowel sounds are normal. She exhibits no distension and no mass. There is no tenderness. There is no rebound and no guarding.  Obese abdomen, soft, non-tender.  Musculoskeletal: Normal range of motion. She exhibits tenderness ( thoracic and paraspinal muscles). She exhibits no edema.  FROM all 4 extremities.  Tenderness along thoracic and lumbar paraspinal muscles w/o midline spinal tenderness  Neurological: She is alert and oriented to person, place, and time.  No ataxia  Skin: Skin is warm and dry.  Psychiatric: Her mood appears anxious.    ED Course  Procedures (including critical care time) Labs Review Labs Reviewed  CBC - Abnormal; Notable for the following:    WBC 16.9 (*)    HCT 35.9 (*)    All other components within normal limits  BASIC METABOLIC PANEL - Abnormal; Notable for the following:    Sodium 135 (*)    Chloride 93 (*)    Glucose, Bld 107 (*)    Creatinine,  Ser 1.58 (*)    GFR calc non Af Amer 36 (*)    GFR calc Af Amer 41 (*)    All other components within normal limits  URINALYSIS, ROUTINE W REFLEX MICROSCOPIC - Abnormal; Notable for the following:    APPearance CLOUDY (*)    Specific Gravity, Urine 1.004 (*)    All other components within normal limits  CBG MONITORING, ED - Abnormal; Notable for the following:    Glucose-Capillary 103 (*)    All other components within normal limits  TROPONIN I    Imaging Review Dg Chest 2 View  07/23/2013   CLINICAL DATA:  Chest pain and shortness of breath or any preceding 24 hr; history of back pain for 2-3 weeks; history of tobacco use and hypertension and diabetes  EXAM: CHEST  2 VIEW  COMPARISON:  DG CHEST 2 VIEW dated 07/05/2013  FINDINGS: The lungs are adequately inflated. There is no focal infiltrate. The interstitial markings are mildly prominent but stable. Old deformities of multiple right ribs are present laterally. The cardiopericardial silhouette is normal in size. The pulmonary vascularity is not engorged. The mediastinum is normal in width. There is no pleural effusion. The trachea is midline. The observed portions of the bony thorax exhibit no acute abnormalities.  IMPRESSION: There is no evidence of pneumonia nor CHF nor other acute cardiopulmonary disease.   Electronically Signed   By: David  [place]   On: 07/23/2013 18:58     EKG Interpretation   Date/Time:  Sunday July 23 2013 17:56:03 EDT Ventricular Rate:  96 PR Interval:  146 QRS Duration: 86 QT Interval:  382 QTC Calculation: 483 R Axis:     Text Interpretation:  Sinus rhythm Anteroseptal infarct, old No  significant change since last tracing Confirmed by JACUBOWITZ  MD, SAM  [phone]) on 07/23/2013 7:36:00 PM      MDM   Final diagnoses:  Chest pain  SOB (shortness of breath)  Polydipsia  Chronic back  pain    Pt is a 58yo morbidly obese female c/o chest pain, SOB, back pain, and polydipsia.  CBG-103 upon arrival  Vitals: WNL.  Labs ordered: CBC, BMP, Troponin, UA.    Pt found to have mild leukocytosis-16.9. Mildly elevated Cr: 1.58 (up from 1.13 two weeks ago), Troponin-negative. EKG: no significant change since last tracing. CXR: no evidence of pneumonia nor CHF, no other acute cardiopulmonary disease.  CP, SOB, and back pain improved with aspirin, morphine, and flexeril.  Discussed pt with Dr. Ethelda Chick who also examined pt. Will admit pt for CP r/o due to no obvious source of pt's reported chest pain and SOB.  8:30 PM Consulted with Dr. Gwenlyn Perking who agreed to admit pt to observation tele bed, for chest pain r/o. Pt is stable at this time.     Junius Finner, PA-C 07/23/13 2103

## 2013-07-23 NOTE — ED Provider Notes (Signed)
CSN: [id number]     Arrival date & time 07/23/13  1748 History   First MD Initiated Contact with Patient 07/23/13 1755     Chief Complaint  Patient presents with  . Chest Pain  . Shortness of Breath  . Polydipsia     (Consider location/radiation/quality/duration/timing/severity/associated sxs/prior Treatment) HPI  Past Medical History  Diagnosis Date  . Hypertension   . Heart murmur   . Asthma   . Emphysema   . Diabetes mellitus   . Hyperlipidemia   . Allergic rhinitis   . Chronic headache   . OSA (obstructive sleep apnea)   . Neuropathy   . Grave's disease   . Herniated disc   . COPD (chronic obstructive pulmonary disease)   . Sleep apnea   . Menopause   . Hernia, umbilical   . Anxiety   . Depression   . Back pain, chronic   . Shingles   . Herniated disc   . Arthritis     rheumatoid  . 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 Generalized anxiety disorder 05/26/2012  . Chronic back pain 05/27/2012    Secondary to L4-5 herniated disc per patient   Past Surgical History  Procedure Laterality Date  . Cholecystectomy    . Tubal ligation    . Laparoscopy    . Foot surgery    . Eye surgery     Family History  Problem Relation Age of Onset  . Emphysema Mother   . Allergies Mother   . Allergies Daughter   . Asthma Mother   . Asthma Brother   . Heart disease Mother   . Rheum arthritis Mother   . Breast cancer Other     aunt  . Lung cancer Brother   . Throat cancer Brother    History  Substance Use Topics  . Smoking status: Current Every Day Smoker -- 1.00 packs/day for 40 years    Types: Cigarettes  . Smokeless tobacco: Never Used  . Alcohol Use: No   OB History   Grav Para Term Preterm Abortions TAB SAB Ect Mult Living   5 3   2  2   3      Review of Systems    Allergies  Ciprofloxacin and Penicillins  Home Medications   Prior to Admission medications   Medication Sig Start Date  End Date Taking? Authorizing Provider  acyclovir (ZOVIRAX) 400 MG tablet Take 800 mg by mouth every morning.    Yes Historical Provider, MD  albuterol (PROAIR HFA) 108 (90 BASE) MCG/ACT inhaler Inhale 2 puffs into the lungs 4 (four) times daily. For COPD   Yes Historical Provider, MD  atorvastatin (LIPITOR) 40 MG tablet Take 1 tablet (40 mg total) by mouth daily. 10/06/12  Yes 12/07/12, DO  beclomethasone (QVAR) 80 MCG/ACT inhaler Inhale 2 puffs into the lungs 2 (two) times daily.    Yes Historical Provider, MD  busPIRone (BUSPAR) 10 MG tablet Take 20 mg by mouth 3 (three) times daily.    Yes Historical Provider, MD  cyclobenzaprine (FLEXERIL) 10 MG tablet Take 1 tablet (10 mg total) by mouth 2 (two) times daily as needed for muscle spasms. 07/19/13  Yes Kaitlyn Szekalski, PA-C  diclofenac sodium (VOLTAREN) 1 % GEL Apply topically 4 (four) times daily.   Yes Historical Provider, MD  FLUoxetine (PROZAC) 20 MG capsule Take 60 mg by mouth every morning.    Yes Historical Provider, MD  glipiZIDE (GLUCOTROL) 10 MG tablet Take 10 mg by mouth 2 (two) times daily before a meal.   Yes Historical Provider, MD  hydrochlorothiazide (HYDRODIURIL) 25 MG tablet Take 25 mg by mouth every morning.    Yes Historical Provider, MD  HYDROcodone-acetaminophen (NORCO) 10-325 MG per tablet Take 1 tablet by mouth every 6 (six) hours as needed for moderate pain.    Yes Historical Provider, MD  indomethacin (INDOCIN) 25 MG capsule Take 25 mg by mouth 2 (two) times daily as needed for mild pain.   Yes Historical Provider, MD  ipratropium-albuterol (DUONEB) 0.5-2.5 (3) MG/3ML SOLN Take 3 mLs by nebulization every 6 (six) hours as needed (Asthma).   Yes Historical Provider, MD  levothyroxine (SYNTHROID, LEVOTHROID) 175 MCG tablet Take 175 mcg by mouth every morning.    Yes Historical Provider, MD  lisinopril (PRINIVIL,ZESTRIL) 10 MG tablet Take 10 mg by mouth daily.   Yes Historical Provider, MD  loratadine (CLARITIN) 10 MG  tablet Take 10 mg by mouth daily as needed for allergies.   Yes Historical Provider, MD  meloxicam (MOBIC) 15 MG tablet Take 15 mg by mouth daily.   Yes Historical Provider, MD  metFORMIN (GLUCOPHAGE) 1000 MG tablet Take 1,000 mg by mouth 2 (two) times daily.    Yes Historical Provider, MD  mirtazapine (REMERON) 45 MG tablet Take 45 mg by mouth at bedtime as needed (Sleep). For insomnia   Yes Historical Provider, MD  pregabalin (LYRICA) 25 MG capsule Take 75 mg by mouth 3 (three) times daily.    Yes Historical Provider, MD  QUEtiapine (SEROQUEL) 25 MG tablet Take 25 mg by mouth at bedtime.   Yes Historical Provider, MD  sulfamethoxazole-trimethoprim (BACTRIM DS) 800-160 MG per tablet Take 1 tablet by mouth 2 (two) times daily. For 7 days. 07/17/13  Yes Historical Provider, MD  traMADol (ULTRAM) 50 MG tablet Take 50 mg by mouth every 6 (six) hours as needed for pain.   Yes Historical Provider, MD  verapamil (CALAN) 40 MG tablet Take 40 mg by mouth 3 (three) times daily.   Yes Historical Provider, MD  benzonatate (TESSALON) 100 MG capsule Take 1 capsule (100 mg total) by mouth every 8 (eight) hours. 07/06/13   Angus Seller, PA-C  oxyCODONE-acetaminophen (PERCOCET/ROXICET) 5-325 MG per tablet Take 1 tablet by mouth every 4 (four) hours as needed for severe pain. 07/12/13   Tatyana A Kirichenko, PA-C   BP 139/76  Pulse 99  Temp(Src) 99.1 F (37.3 C) (Oral)  Resp 20  SpO2 97% Physical Exam  ED Course  Procedures (including critical care time) Labs Review Labs Reviewed  CBC - Abnormal; Notable for the following:    WBC 16.9 (*)    HCT 35.9 (*)    All other components within normal limits  CBG MONITORING, ED - Abnormal; Notable for the following:    Glucose-Capillary 103 (*)    All other components within normal limits  TROPONIN I  BASIC METABOLIC PANEL  URINALYSIS, ROUTINE W REFLEX MICROSCOPIC    Imaging Review Dg Chest 2 View  07/23/2013   CLINICAL DATA:  Chest pain and shortness of  breath or any preceding 24 hr; history of back pain for 2-3 weeks; history of tobacco use and hypertension and diabetes  EXAM: CHEST  2 VIEW  COMPARISON:  DG CHEST 2 VIEW dated 07/05/2013  FINDINGS: The lungs are adequately inflated. There is no focal infiltrate. The interstitial markings are mildly prominent but stable. Old deformities of multiple right ribs  are present laterally. The cardiopericardial silhouette is normal in size. The pulmonary vascularity is not engorged. The mediastinum is normal in width. There is no pleural effusion. The trachea is midline. The observed portions of the bony thorax exhibit no acute abnormalities.  IMPRESSION: There is no evidence of pneumonia nor CHF nor other acute cardiopulmonary disease.   Electronically Signed   By: David  [place]   On: 07/23/2013 18:58     EKG Interpretation None      MDM   Final diagnoses:  None    Cancel--disregard. Duplicate note    Doug Sou, MD 07/23/13 2302

## 2013-07-23 NOTE — ED Notes (Signed)
Bed: NG29 Expected date:  Expected time:  Means of arrival:  Comments: EMS- "hurts to breathe"

## 2013-07-23 NOTE — ED Notes (Signed)
Pt from home via [facility] c/o chest pain, shortness of breath, cough and excessive thirst. She reports drinking 24 bottles of water over the past 3 days but still thirsty.

## 2013-07-23 NOTE — H&P (Signed)
[facility] History and Physical  Vanessa FeilCheryl Lengacher ZOX:[id number] DOB: [birth year] DOA: 07/23/2013  Referring physician: Dr. Ethelda ChickJacubowitz  PCP: Dois DavenportICHTER,KAREN L., MD   Chief Complaint: Chest pain, lower back pain, shortness of breath; polyuria and polydipsia  HPI: Vanessa [name] is a 58 y.o. 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 Patient reports that her chest pains that last night and lasted approximately 2 hours; pain was localized in the middle of her chest and to not have any radiation, patient was on bed at time of chest pain presentation. Patient denies palpitations, headache, fever, chills, cough, nausea, vomiting, melena or hematochezia. Patient reports son increase postnasal drip earlier this week and she started using Claritin for that. Patient also endorses worsening lower back pain with radiation into her legs, which is making difficult her ambulation (also reports increase use in NSAID's due to pain). Patient reports also some polyuria, polydipsia and decrease PO intake. In the ED workup demonstrated acute renal failure (creatinine up to 1.58), normal troponin level, no acute ischemic changes on EKG, no cardiopulmonary process on chest x-ray and normal lipase [facility] has been called to admit the patient for further evaluation and treatment..    Review of Systems:  Negative except as otherwise mentioned on history of present illness.  Past Medical History  Diagnosis Date  . Hypertension   . Heart murmur   . Asthma   . Emphysema   . Diabetes mellitus   . Hyperlipidemia   . Allergic rhinitis   . Chronic headache   . OSA (obstructive sleep  apnea)   . Neuropathy   . Grave's disease   . Herniated disc   . COPD (chronic obstructive pulmonary disease)   . Sleep apnea   . Menopause   . Hernia, umbilical   . Anxiety   . Depression   . Back pain, chronic   . Shingles   . Herniated disc   . Arthritis     rheumatoid  . 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 Generalized anxiety disorder 05/26/2012  . Chronic back pain 05/27/2012    Secondary to L4-5 herniated disc per patient   Past Surgical History  Procedure Laterality Date  . Cholecystectomy    . Tubal ligation    . Laparoscopy    . Foot surgery    . Eye surgery     Social History:  reports that she has been smoking Cigarettes.  She has a 40 pack-year smoking history. She has never used smokeless tobacco. She reports that she does not drink alcohol or use illicit drugs.  Allergies  Allergen Reactions  . Ciprofloxacin Hives  . Penicillins Hives    Family History  Problem Relation Age of Onset  . Emphysema Mother   . Allergies Mother   . Allergies Daughter   . Asthma Mother   . Asthma Brother   . Heart disease Mother   . Rheum arthritis Mother   . Breast cancer Other     aunt  . Lung cancer Brother   . Throat cancer Brother      Prior to Admission medications   Medication Sig Start Date End Date Taking? Authorizing Provider  acyclovir (ZOVIRAX) 400 MG tablet  Take 800 mg by mouth every morning.    Yes Historical Provider, MD  albuterol (PROAIR HFA) 108 (90 BASE) MCG/ACT inhaler Inhale 2 puffs into the lungs 4 (four) times daily. For COPD   Yes Historical Provider, MD  atorvastatin (LIPITOR) 40 MG tablet Take 1 tablet (40 mg total) by mouth daily. 10/06/12  Yes Carlynn Purl, DO  beclomethasone (QVAR) 80 MCG/ACT inhaler Inhale 2 puffs into the lungs 2 (two) times daily.    Yes Historical Provider, MD  busPIRone (BUSPAR) 10 MG tablet Take 20 mg by mouth 3 (three) times daily.    Yes Historical  Provider, MD  cyclobenzaprine (FLEXERIL) 10 MG tablet Take 1 tablet (10 mg total) by mouth 2 (two) times daily as needed for muscle spasms. 07/19/13  Yes Kaitlyn Szekalski, PA-C  diclofenac sodium (VOLTAREN) 1 % GEL Apply topically 4 (four) times daily.   Yes Historical Provider, MD  FLUoxetine (PROZAC) 20 MG capsule Take 60 mg by mouth every morning.    Yes Historical Provider, MD  glipiZIDE (GLUCOTROL) 10 MG tablet Take 10 mg by mouth 2 (two) times daily before a meal.   Yes Historical Provider, MD  hydrochlorothiazide (HYDRODIURIL) 25 MG tablet Take 25 mg by mouth every morning.    Yes Historical Provider, MD  HYDROcodone-acetaminophen (NORCO) 10-325 MG per tablet Take 1 tablet by mouth every 6 (six) hours as needed for moderate pain.    Yes Historical Provider, MD  indomethacin (INDOCIN) 25 MG capsule Take 25 mg by mouth 2 (two) times daily as needed for mild pain.   Yes Historical Provider, MD  ipratropium-albuterol (DUONEB) 0.5-2.5 (3) MG/3ML SOLN Take 3 mLs by nebulization every 6 (six) hours as needed (Asthma).   Yes Historical Provider, MD  levothyroxine (SYNTHROID, LEVOTHROID) 175 MCG tablet Take 175 mcg by mouth every morning.    Yes Historical Provider, MD  lisinopril (PRINIVIL,ZESTRIL) 10 MG tablet Take 10 mg by mouth daily.   Yes Historical Provider, MD  loratadine (CLARITIN) 10 MG tablet Take 10 mg by mouth daily as needed for allergies.   Yes Historical Provider, MD  meloxicam (MOBIC) 15 MG tablet Take 15 mg by mouth daily.   Yes Historical Provider, MD  metFORMIN (GLUCOPHAGE) 1000 MG tablet Take 1,000 mg by mouth 2 (two) times daily.    Yes Historical Provider, MD  mirtazapine (REMERON) 45 MG tablet Take 45 mg by mouth at bedtime as needed (Sleep). For insomnia   Yes Historical Provider, MD  pregabalin (LYRICA) 25 MG capsule Take 75 mg by mouth 3 (three) times daily.    Yes Historical Provider, MD  QUEtiapine (SEROQUEL) 25 MG tablet Take 25 mg by mouth at bedtime.   Yes Historical  Provider, MD  sulfamethoxazole-trimethoprim (BACTRIM DS) 800-160 MG per tablet Take 1 tablet by mouth 2 (two) times daily. For 7 days. 07/17/13  Yes Historical Provider, MD  traMADol (ULTRAM) 50 MG tablet Take 50 mg by mouth every 6 (six) hours as needed for pain.   Yes Historical Provider, MD  verapamil (CALAN) 40 MG tablet Take 40 mg by mouth 3 (three) times daily.   Yes Historical Provider, MD  benzonatate (TESSALON) 100 MG capsule Take 1 capsule (100 mg total) by mouth every 8 (eight) hours. 07/06/13   Angus Seller, PA-C  oxyCODONE-acetaminophen (PERCOCET/ROXICET) 5-325 MG per tablet Take 1 tablet by mouth every 4 (four) hours as needed for severe pain. 07/12/13   Lottie Mussel, PA-C   Physical Exam: Filed Vitals:   07/23/13  2129  BP: 127/72  Pulse: 85  Temp: 98 F (36.7 C)  Resp: 20    BP 127/72  Pulse 85  Temp(Src) 98 F (36.7 C) (Oral)  Resp 20  Ht 5\' 9"  (1.753 m)  Wt 109.77 kg (242 lb)  BMI 35.72 kg/m2  SpO2 100%  General:  Appears in mild discomfort secondary to back pain, but in not acute distress; afebrile and able to follow commands and cooperate with examination Eyes: PERRL, normal lids, extraocular muscles intact, no icterus and no nystagmus  ENT: grossly normal hearing, dry mucous membranes; no erythema, exudates or thrush appreciated in her mouth; no drainage out of her ears or nostrils Neck: no LAD, masses or thyromegaly; difficult to assess for JVD given body habitus Cardiovascular: RRR, no soft diastolic murmur, no rubs or gallops. No LE edema. Respiratory: CTA bilaterally, no w/r/r. Normal respiratory effort. Abdomen: soft, nt, nd, positive bowel sounds, no guarding, no rebound Skin: no rash, petechiae, open wounds or induration seen on exam Musculoskeletal: grossly normal tone BUE/BLE; patient complaining of lumbar spine pain and discomfort with palpation and movement Psychiatric: grossly normal mood and affect, speech fluent and appropriate Neurologic:  grossly non-focal.          Labs on Admission:  Basic Metabolic Panel:  Recent Labs Lab 07/23/13 1830  NA 135*  K 4.5  CL 93*  CO2 29  GLUCOSE 107*  BUN 13  CREATININE 1.58*  CALCIUM 9.5   CBC:  Recent Labs Lab 07/23/13 1830  WBC 16.9*  HGB 12.2  HCT 35.9*  MCV 86.9  PLT 226   Cardiac Enzymes:  Recent Labs Lab 07/23/13 1830  TROPONINI <0.30   CBG:  Recent Labs Lab 07/23/13 1815  GLUCAP 103*    Radiological Exams on Admission: Dg Chest 2 View  07/23/2013   CLINICAL DATA:  Chest pain and shortness of breath or any preceding 24 hr; history of back pain for 2-3 weeks; history of tobacco use and hypertension and diabetes  EXAM: CHEST  2 VIEW  COMPARISON:  DG CHEST 2 VIEW dated 07/05/2013  FINDINGS: The lungs are adequately inflated. There is no focal infiltrate. The interstitial markings are mildly prominent but stable. Old deformities of multiple right ribs are present laterally. The cardiopericardial silhouette is normal in size. The pulmonary vascularity is not engorged. The mediastinum is normal in width. There is no pleural effusion. The trachea is midline. The observed portions of the bony thorax exhibit no acute abnormalities.  IMPRESSION: There is no evidence of pneumonia nor CHF nor other acute cardiopulmonary disease.   Electronically Signed   By: David  09/04/2013   On: 07/23/2013 18:58    EKG:  Sinus rhythm, no acute ischemic changes and no significant difference from previous tracing.  Assessment/Plan 1-Chest pain: Patient with heart score of 4; this factors (obesity, hypertension, hyperlipidemia and diabetes). Currently chest pain-free, troponin negative and no acute ischemic changes on EKG. -Will admit to telemetry -Cycle troponin and serial EKGs -will start low dose coreg, ASA and continue statins -will check lipid profile and start PPI -depending clinical response will need cardiology consult for assistance regarding inpatient vs outpatient stress  test. -PRN morphine and oxygen supplementation  2-OSA (obstructive sleep apnea): most likely contributing to SOB, which could also be due to OHS -will continue CPAP -encourage to lose weight  3-Diabetes mellitus: Will hold oral hypoglycemic agents and use sliding scale insulin while in the hospital. -Will check hemoglobin A1c and place patient on modified carbohydrate  diet  4-Hypothyroidism: Continue Synthroid and check TSH and free T4.  5-Depression and anxiety: No suicidal ideation or hallucinations; stable mood at this moment. Continue home medication regimen  6-Chronic back pain and sciatica: Continue pain medication regimen. Her pain is secondary to L3-L4 herniated disc. Patient endorses that she has been experiencing worsening pain and sciatica symptoms.  Will start Neurontin and if her pain continue to be difficult to control, she might require MRI and base on results neurosurgery consult. -Also started on Flexeril twice a day.  7-GERD (gastroesophageal reflux disease): Worse after being on Bactrim and also taking indomethacin in for her back pain. Will start Protonix BID  8-Morbid obesity: Increase exercise and low calorie diet has been discussed with patient.  9-Renal failure (ARF), acute on chronic: Most likely secondary to mild dehydration and continue use of nephrotoxic agents (metformin, indomethacin, Mobic, lisinopril and HCTZ) -Will provide IV fluid resuscitation -Will check urinalysis -Will hold nephrotoxic agents -Follow renal function trend  10-HLD (hyperlipidemia): Continue statins. Will check lipid profile  11-COPD/asthma: Continue home nebulizer treatment. Currently no wheezing or with good oxygen saturation on room air.   DVT prophylaxis: Heparin   Code Status: Full Family Communication: no family at bedside Disposition Plan: observation, telemetry, LOS < 2 midnights   Time spent: 50 minutes  Vassie Loll [facility] Pager [phone]

## 2013-07-23 NOTE — ED Notes (Addendum)
Pt abdomen distended. She states "this is biggest I've been in a while". She states that her lower back hurts and feels like it is pushing into her abdomen. She also reports that she has been urinating quite frequently.

## 2013-07-24 DIAGNOSIS — I359 Nonrheumatic aortic valve disorder, unspecified: Secondary | ICD-10-CM

## 2013-07-24 LAB — GLUCOSE, CAPILLARY
GLUCOSE-CAPILLARY: 77 mg/dL (ref 70–99)
GLUCOSE-CAPILLARY: 119 mg/dL — AB (ref 70–99)
Glucose-Capillary: 122 mg/dL — ABNORMAL HIGH (ref 70–99)
Glucose-Capillary: 140 mg/dL — ABNORMAL HIGH (ref 70–99)
Glucose-Capillary: 149 mg/dL — ABNORMAL HIGH (ref 70–99)

## 2013-07-24 LAB — BASIC METABOLIC PANEL
BUN: 16 mg/dL (ref 6–23)
CO2: 29 mEq/L (ref 19–32)
Calcium: 8.6 mg/dL (ref 8.4–10.5)
Chloride: 96 mEq/L (ref 96–112)
Creatinine, Ser: 1.29 mg/dL — ABNORMAL HIGH (ref 0.50–1.10)
GFR calc Af Amer: 53 mL/min — ABNORMAL LOW (ref >90)
GFR, EST NON AFRICAN AMERICAN: 45 mL/min — AB (ref >90)
Glucose, Bld: 144 mg/dL — ABNORMAL HIGH (ref 70–99)
Potassium: 4.7 mEq/L (ref 3.7–5.3)
SODIUM: 136 meq/L — AB (ref 137–147)

## 2013-07-24 LAB — TSH: TSH: 1.24 u[IU]/mL (ref 0.350–4.500)

## 2013-07-24 LAB — CBC
HCT: 35.3 % — ABNORMAL LOW (ref 36.0–46.0)
Hemoglobin: 11.5 g/dL — ABNORMAL LOW (ref 12.0–15.0)
MCH: 29 pg (ref 26.0–34.0)
MCHC: 32.6 g/dL (ref 30.0–36.0)
MCV: 89.1 fL (ref 78.0–100.0)
PLATELETS: 202 10*3/uL (ref 150–400)
RBC: 3.96 MIL/uL (ref 3.87–5.11)
RDW: 14.2 % (ref 11.5–15.5)
WBC: 13.9 10*3/uL — ABNORMAL HIGH (ref 4.0–10.5)

## 2013-07-24 LAB — LIPID PANEL
Cholesterol: 124 mg/dL (ref 0–200)
HDL: 39 mg/dL — ABNORMAL LOW (ref >39)
LDL Cholesterol: 48 mg/dL (ref 0–99)
TRIGLYCERIDES: 186 mg/dL — AB (ref <150)
Total CHOL/HDL Ratio: 3.2 RATIO
VLDL: 37 mg/dL (ref 0–40)

## 2013-07-24 LAB — T4, FREE: Free T4: 1.02 ng/dL (ref 0.80–1.80)

## 2013-07-24 LAB — HEMOGLOBIN A1C
Hgb A1c MFr Bld: 6.8 % — ABNORMAL HIGH (ref <5.7)
MEAN PLASMA GLUCOSE: 148 mg/dL — AB (ref <117)

## 2013-07-24 LAB — TROPONIN I

## 2013-07-24 MED ORDER — ASPIRIN 81 MG PO CHEW
81.0000 mg | CHEWABLE_TABLET | Freq: Every day | ORAL | Status: DC
  Administered 2013-07-25: 81 mg via ORAL
  Filled 2013-07-24: qty 1

## 2013-07-24 MED ORDER — MORPHINE SULFATE 2 MG/ML IJ SOLN
1.0000 mg | INTRAMUSCULAR | Status: DC | PRN
  Administered 2013-07-24 – 2013-07-25 (×4): 1 mg via INTRAVENOUS
  Filled 2013-07-24 (×4): qty 1

## 2013-07-24 MED ORDER — PREGABALIN 75 MG PO CAPS
75.0000 mg | ORAL_CAPSULE | Freq: Three times a day (TID) | ORAL | Status: DC
  Administered 2013-07-24 – 2013-07-25 (×3): 75 mg via ORAL
  Filled 2013-07-24 (×3): qty 1

## 2013-07-24 NOTE — Progress Notes (Signed)
Echocardiogram 2D Echocardiogram has been performed.  Liam Graham 07/24/2013, 10:44 AM

## 2013-07-24 NOTE — Progress Notes (Signed)
UR Completed.  Vanessa [name] Vanessa [name] [phone] 07/24/2013  

## 2013-07-24 NOTE — Evaluation (Signed)
Physical Therapy Evaluation Patient Details Name: Vanessa [name] MRN: [id number] DOB: 1955/11/01 Today's Date: 07/24/2013   History of Present Illness  admitted with atypical chest pain, back pain.  Clinical Impression   Pt reports mid back pain increases as she walks farther. Pt requests a quad cane. Will have her practice before requesting order. Pt will benefit from PT to address problems listed    Follow Up Recommendations No PT follow up    Equipment Recommendations   (quad cane)    Recommendations for Other Services       Precautions / Restrictions Precautions Precautions: Fall      Mobility  Bed Mobility Overal bed mobility: Independent                Transfers Overall transfer level: Needs assistance Equipment used: None Transfers: Sit to/from Stand Sit to Stand: Supervision            Ambulation/Gait Ambulation/Gait assistance: Supervision;Min guard Ambulation Distance (Feet): 200 Feet Assistive device: None Gait Pattern/deviations: Step-through pattern;Wide base of support Gait velocity: decreased   General Gait Details: pt gradually tends to hyperextend trunk  Stairs            Wheelchair Mobility    Modified Rankin (Stroke Patients Only)       Balance                                             Pertinent Vitals/Pain Mid back pain 8- RN aware.    Home Living Family/patient expects to be discharged to:: Private residence Living Arrangements: Spouse/significant other Available Help at Discharge: Friend(s) Type of Home: House Home Access: Stairs to enter Entrance Stairs-Rails: None Entrance Stairs-Number of Steps: 2 Home Layout: One level Home Equipment: None Additional Comments: has a "stick"    Prior Function Level of Independence: Independent               Hand Dominance        Extremity/Trunk Assessment   Upper Extremity Assessment: Overall WFL for tasks assessed            Lower Extremity Assessment: Overall WFL for tasks assessed      Cervical / Trunk Assessment: Normal  Communication   Communication: No difficulties  Cognition Arousal/Alertness: Awake/alert Behavior During Therapy: WFL for tasks assessed/performed Overall Cognitive Status: Within Functional Limits for tasks assessed                      General Comments      Exercises        Assessment/Plan    PT Assessment Patient needs continued PT services  PT Diagnosis Difficulty walking;Acute pain   PT Problem List Decreased strength;Decreased activity tolerance;Pain  PT Treatment Interventions DME instruction;Gait training;Functional mobility training;Therapeutic activities;Patient/family education   PT Goals (Current goals can be found in the Care Plan section) Acute Rehab PT Goals Patient Stated Goal: I want to get around without pain. I need a cane PT Goal Formulation: With patient Time For Goal Achievement: 08/07/13 Potential to Achieve Goals: Good    Frequency Min 3X/week   Barriers to discharge        Co-evaluation               End of Session Equipment Utilized During Treatment: Gait belt Activity Tolerance: Patient limited by pain Patient left: in bed;with call bell/phone  within reach Nurse Communication: Mobility status;Patient requests pain meds    Functional Assessment Tool Used: clinical judgement Functional Limitation: Mobility: Walking and moving around Mobility: Walking and Moving Around Current Status [phone]): At least 1 percent but less than 20 percent impaired, limited or restricted Mobility: Walking and Moving Around Goal Status [phone]): 0 percent impaired, limited or restricted    Time: 1553-1611 PT Time Calculation (min): 18 min   Charges:   PT Evaluation $Initial PT Evaluation Tier I: 1 Procedure PT Treatments $Gait Training: 8-22 mins   PT G Codes:   Functional Assessment Tool Used: clinical judgement Functional  Limitation: Mobility: Walking and moving around    [employer] 07/24/2013, 4:35 PM Blanchard Kelch PT [phone]

## 2013-07-24 NOTE — Progress Notes (Signed)
[facility] PROGRESS NOTE  Vanessa [name] ONG:[id number] DOB: 10-20-1955 DOA: 07/23/2013 PCP: Dois Davenport., MD  Assessment/Plan:  1-Atypical Chest pain -suspect related to GERD/gastritis -d-dimer normal -s/p cholecystectomy -Cardiac Cath 2014 with completely normal coronaries, no further cardiac indicated -cardiac enzymes negative -2D echo done will FU -will DC coreg due to COPD and absence of CAD -Change PPI to BID and advised cutting down NSAIDs  2-OSA (obstructive sleep apnea): -will continue CPAP  -encouraged to lose weight   3-Diabetes mellitus:  -hold glipizide and metformin -FU hb A1c   4-Hypothyroidism:  -Continue Synthroid,  TSH and free T4 normal  5-Depression and anxiety:  - Continue home medication regimen   6-Chronic back pain and sciatica:  -Continue pain medication regimen -reportedly secondary to L3-L4 herniated disc, long standing for years -advised to FU with her Orthopedic surgeon, saw him 2 weeks ago -resume lyrica, this was reportedly helping her moderately -will DC neurontin -PT eval  7-GERD -see #1  8-Morbid obesity: -lifestyle modification  9-Renal failure (ARF), acute on chronic:  -due to indomethacin, Mobic, lisinopril and HCTZ)  -improving, stop IVF -advised to minimize NSAIDs  10-HLD (hyperlipidemia):  -Continue statins  11-COPD/asthma: -nebs PRN -stable, advised to quit smoking  DVT prophylaxis: Heparin   Code Status: Full  Family Communication: no family at bedside  Disposition Plan: home later today or in am   HPI/Subjective: Feels a little better, chronic back pain, radiating to legs, asking for lyrica No chest pain today, but intermittent and ongoing for months-years  Objective: Filed Vitals:   07/24/13 0428  BP: 112/70  Pulse: 67  Temp: 97.6 F (36.4 C)  Resp: 16    Intake/Output Summary (Last 24 hours) at 07/24/13 1229 Last data filed at 07/24/13 4401  Gross per 24 hour  Intake   2060 ml   Output   2400 ml  Net   -340 ml   Filed Weights   07/23/13 2129  Weight: 109.77 kg (242 lb)    Exam:   General:  AAOx3  Cardiovascular: S1S2/RRR  Respiratory: CTAB  Abdomen: soft, NT, BS present  Musculoskeletal: no edema c/c  NEuro: non focal, no localizing signs   Data Reviewed: Basic Metabolic Panel:  Recent Labs Lab 07/23/13 1830 07/23/13 2314 07/24/13 0437  NA 135*  --  136*  K 4.5  --  4.7  CL 93*  --  96  CO2 29  --  29  GLUCOSE 107*  --  144*  BUN 13  --  16  CREATININE 1.58*  --  1.29*  CALCIUM 9.5  --  8.6  PHOS  --  5.3*  --    Liver Function Tests: No results found for this basename: AST, ALT, ALKPHOS, BILITOT, PROT, ALBUMIN,  in the last 168 hours No results found for this basename: LIPASE, AMYLASE,  in the last 168 hours No results found for this basename: AMMONIA,  in the last 168 hours CBC:  Recent Labs Lab 07/23/13 1830 07/24/13 0437  WBC 16.9* 13.9*  HGB 12.2 11.5*  HCT 35.9* 35.3*  MCV 86.9 89.1  PLT 226 202   Cardiac Enzymes:  Recent Labs Lab 07/23/13 1830 07/23/13 2314 07/24/13 0437  TROPONINI <0.30 <0.30 <0.30   BNP (last 3 results) No results found for this basename: PROBNP,  in the last 8760 hours CBG:  Recent Labs Lab 07/23/13 1815 07/23/13 2127 07/24/13 0759  GLUCAP 103* 77 119*    No results found for this or any previous visit (from the  past 240 hour(s)).   Studies: Dg Chest 2 View  07/23/2013   CLINICAL DATA:  Chest pain and shortness of breath or any preceding 24 hr; history of back pain for 2-3 weeks; history of tobacco use and hypertension and diabetes  EXAM: CHEST  2 VIEW  COMPARISON:  DG CHEST 2 VIEW dated 07/05/2013  FINDINGS: The lungs are adequately inflated. There is no focal infiltrate. The interstitial markings are mildly prominent but stable. Old deformities of multiple right ribs are present laterally. The cardiopericardial silhouette is normal in size. The pulmonary vascularity is not  engorged. The mediastinum is normal in width. There is no pleural effusion. The trachea is midline. The observed portions of the bony thorax exhibit no acute abnormalities.  IMPRESSION: There is no evidence of pneumonia nor CHF nor other acute cardiopulmonary disease.   Electronically Signed   By: David  [place]   On: 07/23/2013 18:58    Scheduled Meds: . acyclovir  400 mg Oral q morning - 10a  . atorvastatin  40 mg Oral Daily  . busPIRone  20 mg Oral TID  . carvedilol  6.25 mg Oral BID WC  . cyclobenzaprine  5 mg Oral BID  . diclofenac sodium  2 g Topical QID  . FLUoxetine  60 mg Oral q morning - 10a  . fluticasone  2 puff Inhalation BID  . gabapentin  100 mg Oral TID  . heparin  5,000 Units Subcutaneous 3 times per day  . insulin aspart  0-15 Units Subcutaneous TID WC  . insulin aspart  0-5 Units Subcutaneous QHS  . levothyroxine  175 mcg Oral QAC breakfast  . mirtazapine  15 mg Oral QHS  . pantoprazole  40 mg Oral BID  . polyethylene glycol  17 g Oral Daily  . QUEtiapine  25 mg Oral QHS  . sodium chloride  3 mL Intravenous Q12H  . verapamil  40 mg Oral TID   Continuous Infusions:  Antibiotics Given (last 72 hours)   Date/Time Action Medication Dose   07/24/13 1104 Given   acyclovir (ZOVIRAX) tablet 400 mg 400 mg      Principal Problem:   Chest pain Active Problems:   OSA (obstructive sleep apnea)   Diabetes mellitus   Hypothyroidism   Depression   Chronic back pain   GERD (gastroesophageal reflux disease)   Morbid obesity   Renal failure (ARF), acute on chronic   HLD (hyperlipidemia)    Time spent:    Zannie Cove  [facility] Pager [phone]. If 7PM-7AM, please contact night-coverage at [email or web address], password [facility] 07/24/2013, 12:29 PM  LOS: 1 day

## 2013-07-25 LAB — GLUCOSE, CAPILLARY
GLUCOSE-CAPILLARY: 153 mg/dL — AB (ref 70–99)
Glucose-Capillary: 147 mg/dL — ABNORMAL HIGH (ref 70–99)

## 2013-07-25 MED ORDER — BENZONATATE 100 MG PO CAPS: 100.0000 mg | ORAL_CAPSULE | Freq: Two times a day (BID) | ORAL | Status: DC | PRN

## 2013-07-25 MED ORDER — PANTOPRAZOLE SODIUM 40 MG PO TBEC: 40.0000 mg | DELAYED_RELEASE_TABLET | Freq: Two times a day (BID) | ORAL | Status: DC

## 2013-07-25 NOTE — Care Management Note (Signed)
    Page 1 of 1   07/25/2013     10:58:45 AM CARE MANAGEMENT NOTE 07/25/2013  Patient:  Vanessa [name], Vanessa [name]   Account Number:  [id number]  Date Initiated:  07/25/2013  Documentation initiated by:  Lanier Clam  Subjective/Objective Assessment:   58 Y/O F ADMITTED W/CHEST PAIN.     Action/Plan:   FROM HOME.HAS PCP,PHARMACY.   Anticipated DC Date:  07/25/2013   Anticipated DC Plan:  HOME/SELF CARE      DC Planning Services  CM consult      Choice offered to / List presented to:             Status of service:  In process, will continue to follow Medicare Important Message given?   (If response is "NO", the following Medicare IM given date fields will be blank) Date Medicare IM given:   Date Additional Medicare IM given:    Discharge Disposition:    Per UR Regulation:  Reviewed for med. necessity/level of care/duration of stay  If discussed at Long Length of Stay Meetings, dates discussed:    Comments:  07/25/13 Gerad Cornelio RN,BSN NCM [phone] PT-NO F/U.NO ANTICIPATED D/C NEEDS.

## 2013-08-02 NOTE — Discharge Summary (Signed)
Physician Discharge Summary  [name] YHC:[id number] DOB: 24-Feb-1956 DOA: 07/23/2013  PCP: Dois Davenport., MD  Admit date: 07/23/2013 Discharge date: 07/25/2013  Time spent: 45 minutes  Recommendations for Outpatient Follow-up:  1. PCP in 1 week 2. FU with Orthopedics for chronic back pain  Discharge Diagnoses:  Principal Problem:   Atypical Chest pain Active Problems:   OSA (obstructive sleep apnea)   Diabetes mellitus   Hypothyroidism   Depression   Chronic back pain   GERD (gastroesophageal reflux disease)   Morbid obesity   Renal failure (ARF), acute on chronic   HLD (hyperlipidemia)   Discharge Condition: stable  Diet recommendation: low sodium  Filed Weights   07/23/13 2129  Weight: 109.77 kg (242 lb)    History of present illness:  Vanessa [name] is a 57 y.o. 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 Patient reports that her chest pains that last night and lasted approximately 2 hours; pain was localized in the middle of her chest and to not have any radiation, patient was on bed at time of chest pain presentation. Patient denies palpitations, headache, fever, chills, cough, nausea, vomiting, melena or hematochezia. Patient reports son increase postnasal drip earlier this week and she started using Claritin for that. Patient also endorses worsening lower back pain with radiation into her legs, which is making difficult her ambulation (also reports increase use in NSAID's due to pain). Patient reports also some polyuria, polydipsia and decrease PO intake.   Hospital Course:  1-Atypical Chest pain  -suspect related to GERD/gastritis   -d-dimer normal  -s/p cholecystectomy  -Cardiac Cath 2014 with completely normal coronaries, no further cardiac indicated  -cardiac enzymes negative  -2D echo, EF of 55-60% with normal wall motion -will DC coreg due to COPD and absence of CAD  -Change PPI to BID and advised cutting down NSAIDs   2-OSA (obstructive sleep apnea):  -will continue CPAP  -encouraged to lose weight   3-Diabetes mellitus:  -resumed glipizide and metformin  -monitor creatinine closely while on metformin  4-Hypothyroidism:  -Continue Synthroid, TSH and free T4 normal   5-Depression and anxiety:  - Continue home medication regimen   6-Chronic back pain and sciatica:  -Continue pain medication regimen  -reportedly secondary to L3-L4 herniated disc, long standing for years  -advised to FU with her Orthopedic surgeon, saw him 2 weeks ago  -resume lyrica, this was reportedly helping her moderately  -PT eval completed, home health set up  7-GERD  -see #1   8-Morbid obesity:  -lifestyle modification   9-Renal failure (ARF), acute on chronic:  -due to indomethacin, Mobic, lisinopril and HCTZ)  -improving, stop IVF  -advised to minimize NSAIDs   10-HLD (hyperlipidemia):  -Continue statins   11-COPD/asthma:  -nebs PRN  -stable, advised to quit smoking   Discharge Exam: Filed Vitals:   07/25/13 0539  BP: 109/65  Pulse: 68  Temp: 98.4 F (36.9 C)  Resp: 16    General: AAOx3 Cardiovascular: S1S2/RRR Respiratory: CTAB  Discharge Instructions You were cared for by a hospitalist during your hospital stay. If you have any questions about your discharge medications or the care you received while you were in the hospital after you are discharged, you can call the unit and asked to speak  with the hospitalist on call if the hospitalist that took care of you is not available. Once you are discharged, your primary care physician will handle any further medical issues. Please note that NO REFILLS for  any discharge medications will be authorized once you are discharged, as it is imperative that you return to your primary care physician (or establish a relationship with a primary care physician if you do not have one) for your aftercare needs so that they can reassess your need for medications and monitor your lab values.  Discharge Orders   Future Appointments Provider Department Dept Phone   07/12/2014 12:00 PM Barbaraann Share, MD [facility] [phone]   Future Orders Complete By Expires   Diet - low sodium heart healthy  As directed    Diet Carb Modified  As directed    Increase activity slowly  As directed        Medication List    STOP taking these medications       indomethacin 25 MG capsule  Commonly known as:  INDOCIN     sulfamethoxazole-trimethoprim 800-160 MG per tablet  Commonly known as:  BACTRIM DS      TAKE these medications       acyclovir 400 MG tablet  Commonly known as:  ZOVIRAX  Take 800 mg by mouth every morning.     atorvastatin 40 MG tablet  Commonly known as:  LIPITOR  Take 1 tablet (40 mg total) by mouth daily.     beclomethasone 80 MCG/ACT inhaler  Commonly known as:  QVAR  Inhale 2 puffs into the lungs 2 (two) times daily.     benzonatate 100 MG capsule  Commonly known as:  TESSALON  Take 1 capsule (100 mg total) by mouth 2 (two) times daily as needed for cough.     busPIRone 10 MG tablet  Commonly known as:  BUSPAR  Take 20 mg by mouth 3 (three) times daily.     cyclobenzaprine 10 MG tablet  Commonly known as:  FLEXERIL  Take 1 tablet (10 mg total) by mouth 2 (two) times daily as needed for muscle spasms.     diclofenac sodium 1 % Gel  Commonly known as:  VOLTAREN  Apply topically 4 (four) times daily.     glipiZIDE 10 MG tablet  Commonly known as:  GLUCOTROL  Take 10 mg by mouth 2 (two) times daily before a meal.     hydrochlorothiazide 25 MG tablet  Commonly known as:  HYDRODIURIL  Take 25 mg by mouth every  morning.     HYDROcodone-acetaminophen 10-325 MG per tablet  Commonly known as:  NORCO  Take 1 tablet by mouth every 6 (six) hours as needed for moderate pain.     ipratropium-albuterol 0.5-2.5 (3) MG/3ML Soln  Commonly known as:  DUONEB  Take 3 mLs by nebulization every 6 (six) hours as needed (Asthma).     levothyroxine 175 MCG tablet  Commonly known as:  SYNTHROID, LEVOTHROID  Take 175 mcg by mouth every morning.     lisinopril 10 MG tablet  Commonly known as:  PRINIVIL,ZESTRIL  Take 10 mg by mouth daily.     loratadine 10 MG tablet  Commonly known as:  CLARITIN  Take 10 mg by mouth daily as needed for allergies.     meloxicam 15 MG tablet  Commonly known as:  MOBIC  Take 15 mg by mouth daily.     metFORMIN 1000 MG tablet  Commonly known as:  GLUCOPHAGE  Take 1,000 mg by mouth 2 (two) times daily.     mirtazapine 45 MG tablet  Commonly known as:  REMERON  Take 45 mg by mouth at bedtime as needed (Sleep). For insomnia     oxyCODONE-acetaminophen 5-325 MG per tablet  Commonly known as:  PERCOCET/ROXICET  Take 1 tablet by mouth every 4 (four) hours as needed for severe pain.     pantoprazole 40 MG tablet  Commonly known as:  PROTONIX  Take 1 tablet (40 mg total) by mouth 2 (two) times daily.     pregabalin 25 MG capsule  Commonly known as:  LYRICA  Take 75 mg by mouth 3 (three) times daily.     PROAIR HFA 108 (90 BASE) MCG/ACT inhaler  Generic drug:  albuterol  Inhale 2 puffs into the lungs 4 (four) times daily. For COPD     PROZAC 20 MG capsule  Generic drug:  FLUoxetine  Take 60 mg by mouth every morning.     QUEtiapine 25 MG tablet  Commonly known as:  SEROQUEL  Take 25 mg by mouth at bedtime.     traMADol 50 MG tablet  Commonly known as:  ULTRAM  Take 50 mg by mouth every 6 (six) hours as needed for pain.     verapamil 40 MG tablet  Commonly known as:  CALAN  Take 40 mg by mouth 3 (three) times daily.       Allergies  Allergen Reactions  .  Ciprofloxacin Hives  . Penicillins Hives       Follow-up Information   Follow up with Jearld Lesch, MD. Schedule an appointment as soon as possible for a visit in 1 week.   Specialty:  Specialist   Contact information:   [address] [place] [address] [phone]       Follow up with Orthopedic surgeon. Schedule an appointment as soon as possible for a visit in 2 weeks.       The results of significant diagnostics from this hospitalization (including imaging, microbiology, ancillary and laboratory) are listed below for reference.    Significant Diagnostic Studies: Dg Chest 2 View  07/23/2013   CLINICAL DATA:  Chest pain and shortness of breath or any preceding 24 hr; history of back pain for 2-3 weeks; history of tobacco use and hypertension and diabetes  EXAM: CHEST  2 VIEW  COMPARISON:  DG CHEST 2 VIEW dated 07/05/2013  FINDINGS: The lungs are adequately inflated. There is no focal infiltrate. The interstitial markings are mildly prominent but stable. Old deformities of multiple right ribs are present laterally. The cardiopericardial silhouette is normal in size. The pulmonary vascularity is not engorged. The mediastinum is normal in width. There is no pleural effusion. The trachea is midline. The observed portions of the bony thorax exhibit no acute abnormalities.  IMPRESSION: There is no evidence of pneumonia nor CHF nor other acute cardiopulmonary disease.   Electronically Signed   By: David  [place]   On: 07/23/2013 18:58   Dg Chest 2 View (if Patient Has Fever And/or Copd)  07/05/2013   CLINICAL DATA:  Chest pain.  EXAM: CHEST  2 VIEW  COMPARISON:  10/05/2012  FINDINGS: The heart size and mediastinal contours are within normal limits. Both lungs are clear. The visualized skeletal structures are unremarkable.  IMPRESSION: No active cardiopulmonary disease.   Electronically Signed   By: Loralie Champagne M.D.   On: 07/05/2013 19:24   Dg Knee Complete 4 Views  Right  07/12/2013   CLINICAL DATA:  Right knee pain  EXAM: RIGHT KNEE - COMPLETE 4+ VIEW  COMPARISON:  None.  FINDINGS: Small joint effusion is identified. Mild irregularity is noted in the lateral tibial plateau which which is likely related to degenerative change. No definitive acute fracture is seen.  IMPRESSION: Small joint effusion.  Mild degenerative change without acute bony abnormality.   Electronically Signed   By: Alcide Clever M.D.   On: 07/12/2013 16:19    Microbiology: No results found for this or any previous visit (from the past 240 hour(s)).   Labs: Basic Metabolic Panel: No results found for this basename: NA, K, CL, CO2, GLUCOSE, BUN, CREATININE, CALCIUM, MG, PHOS,  in the last 168 hours Liver Function Tests: No results found for this basename: AST, ALT, ALKPHOS, BILITOT, PROT, ALBUMIN,  in the last 168 hours No results found for this basename: LIPASE, AMYLASE,  in the last 168 hours No results found for this basename: AMMONIA,  in the last 168 hours CBC: No results found for this basename: WBC, NEUTROABS, HGB, HCT, MCV, PLT,  in the last 168 hours Cardiac Enzymes: No results found for this basename: CKTOTAL, CKMB, CKMBINDEX, TROPONINI,  in the last 168 hours BNP: BNP (last 3 results) No results found for this basename: PROBNP,  in the last 8760 hours CBG: No results found for this basename: GLUCAP,  in the last 168 hours     Signed:  Zannie Cove  [facility] 08/02/2013, 5:54 PM

## 2013-10-07 ENCOUNTER — Other Ambulatory Visit: Payer: Self-pay | Admitting: Orthopedic Surgery

## 2013-10-10 ENCOUNTER — Encounter (HOSPITAL_BASED_OUTPATIENT_CLINIC_OR_DEPARTMENT_OTHER): Payer: Self-pay | Admitting: *Deleted

## 2013-10-10 ENCOUNTER — Encounter (HOSPITAL_BASED_OUTPATIENT_CLINIC_OR_DEPARTMENT_OTHER)
Admission: RE | Admit: 2013-10-10 | Discharge: 2013-10-10 | Disposition: A | Discharge status: Home / Self Care | Payer: Medicaid Other | Source: Ambulatory Visit | Attending: Orthopedic Surgery | Admitting: Orthopedic Surgery

## 2013-10-10 DIAGNOSIS — E890 Postprocedural hypothyroidism: Secondary | ICD-10-CM | POA: Diagnosis not present

## 2013-10-10 DIAGNOSIS — K219 Gastro-esophageal reflux disease without esophagitis: Secondary | ICD-10-CM | POA: Diagnosis not present

## 2013-10-10 DIAGNOSIS — F411 Generalized anxiety disorder: Secondary | ICD-10-CM | POA: Diagnosis not present

## 2013-10-10 DIAGNOSIS — G4733 Obstructive sleep apnea (adult) (pediatric): Secondary | ICD-10-CM | POA: Diagnosis not present

## 2013-10-10 DIAGNOSIS — M5126 Other intervertebral disc displacement, lumbar region: Secondary | ICD-10-CM | POA: Diagnosis not present

## 2013-10-10 DIAGNOSIS — J438 Other emphysema: Secondary | ICD-10-CM | POA: Diagnosis not present

## 2013-10-10 DIAGNOSIS — F17200 Nicotine dependence, unspecified, uncomplicated: Secondary | ICD-10-CM | POA: Diagnosis not present

## 2013-10-10 DIAGNOSIS — M2240 Chondromalacia patellae, unspecified knee: Secondary | ICD-10-CM | POA: Diagnosis present

## 2013-10-10 DIAGNOSIS — M23359 Other meniscus derangements, posterior horn of lateral meniscus, unspecified knee: Secondary | ICD-10-CM | POA: Diagnosis not present

## 2013-10-10 DIAGNOSIS — F3289 Other specified depressive episodes: Secondary | ICD-10-CM | POA: Diagnosis not present

## 2013-10-10 DIAGNOSIS — M23349 Other meniscus derangements, anterior horn of lateral meniscus, unspecified knee: Secondary | ICD-10-CM | POA: Diagnosis not present

## 2013-10-10 DIAGNOSIS — E785 Hyperlipidemia, unspecified: Secondary | ICD-10-CM | POA: Diagnosis not present

## 2013-10-10 DIAGNOSIS — E119 Type 2 diabetes mellitus without complications: Secondary | ICD-10-CM | POA: Diagnosis not present

## 2013-10-10 DIAGNOSIS — J449 Chronic obstructive pulmonary disease, unspecified: Secondary | ICD-10-CM | POA: Diagnosis not present

## 2013-10-10 DIAGNOSIS — Z01812 Encounter for preprocedural laboratory examination: Secondary | ICD-10-CM | POA: Diagnosis not present

## 2013-10-10 DIAGNOSIS — F329 Major depressive disorder, single episode, unspecified: Secondary | ICD-10-CM | POA: Diagnosis not present

## 2013-10-10 DIAGNOSIS — Z6834 Body mass index (BMI) 34.0-34.9, adult: Secondary | ICD-10-CM | POA: Diagnosis not present

## 2013-10-10 DIAGNOSIS — M1710 Unilateral primary osteoarthritis, unspecified knee: Secondary | ICD-10-CM | POA: Diagnosis not present

## 2013-10-10 DIAGNOSIS — Z880 Allergy status to penicillin: Secondary | ICD-10-CM | POA: Diagnosis not present

## 2013-10-10 DIAGNOSIS — I10 Essential (primary) hypertension: Secondary | ICD-10-CM | POA: Diagnosis not present

## 2013-10-10 LAB — BASIC METABOLIC PANEL
Anion gap: 13 (ref 5–15)
BUN: 10 mg/dL (ref 6–23)
CALCIUM: 9.5 mg/dL (ref 8.4–10.5)
CO2: 30 meq/L (ref 19–32)
CREATININE: 0.88 mg/dL (ref 0.50–1.10)
Chloride: 99 mEq/L (ref 96–112)
GFR calc Af Amer: 84 mL/min — ABNORMAL LOW (ref >90)
GFR, EST NON AFRICAN AMERICAN: 72 mL/min — AB (ref >90)
GLUCOSE: 60 mg/dL — AB (ref 70–99)
Potassium: 4.4 mEq/L (ref 3.7–5.3)
Sodium: 142 mEq/L (ref 137–147)

## 2013-10-10 NOTE — Progress Notes (Signed)
To come in for bmet-to bring all meds and cpap dos-

## 2013-10-13 ENCOUNTER — Encounter (HOSPITAL_BASED_OUTPATIENT_CLINIC_OR_DEPARTMENT_OTHER)
Admission: RE | Disposition: A | Discharge status: Home / Self Care | Payer: Self-pay | Source: Ambulatory Visit | Attending: Orthopedic Surgery

## 2013-10-13 ENCOUNTER — Ambulatory Visit (HOSPITAL_BASED_OUTPATIENT_CLINIC_OR_DEPARTMENT_OTHER): Payer: Medicaid Other | Admitting: Anesthesiology

## 2013-10-13 ENCOUNTER — Ambulatory Visit (HOSPITAL_BASED_OUTPATIENT_CLINIC_OR_DEPARTMENT_OTHER)
Admission: RE | Admit: 2013-10-13 | Discharge: 2013-10-13 | Disposition: A | Discharge status: Home / Self Care | Payer: Medicaid Other | Source: Ambulatory Visit | Attending: Orthopedic Surgery | Admitting: Orthopedic Surgery

## 2013-10-13 ENCOUNTER — Encounter (HOSPITAL_BASED_OUTPATIENT_CLINIC_OR_DEPARTMENT_OTHER): Payer: Medicaid Other | Admitting: Anesthesiology

## 2013-10-13 ENCOUNTER — Encounter (HOSPITAL_BASED_OUTPATIENT_CLINIC_OR_DEPARTMENT_OTHER): Payer: Self-pay | Admitting: Anesthesiology

## 2013-10-13 DIAGNOSIS — M2240 Chondromalacia patellae, unspecified knee: Secondary | ICD-10-CM | POA: Diagnosis not present

## 2013-10-13 DIAGNOSIS — E890 Postprocedural hypothyroidism: Secondary | ICD-10-CM | POA: Insufficient documentation

## 2013-10-13 DIAGNOSIS — F3289 Other specified depressive episodes: Secondary | ICD-10-CM | POA: Insufficient documentation

## 2013-10-13 DIAGNOSIS — F411 Generalized anxiety disorder: Secondary | ICD-10-CM | POA: Insufficient documentation

## 2013-10-13 DIAGNOSIS — M5126 Other intervertebral disc displacement, lumbar region: Secondary | ICD-10-CM | POA: Insufficient documentation

## 2013-10-13 DIAGNOSIS — S83281A Other tear of lateral meniscus, current injury, right knee, initial encounter: Secondary | ICD-10-CM

## 2013-10-13 DIAGNOSIS — E119 Type 2 diabetes mellitus without complications: Secondary | ICD-10-CM | POA: Insufficient documentation

## 2013-10-13 DIAGNOSIS — M23359 Other meniscus derangements, posterior horn of lateral meniscus, unspecified knee: Secondary | ICD-10-CM | POA: Diagnosis not present

## 2013-10-13 DIAGNOSIS — F329 Major depressive disorder, single episode, unspecified: Secondary | ICD-10-CM | POA: Insufficient documentation

## 2013-10-13 DIAGNOSIS — E785 Hyperlipidemia, unspecified: Secondary | ICD-10-CM | POA: Insufficient documentation

## 2013-10-13 DIAGNOSIS — J449 Chronic obstructive pulmonary disease, unspecified: Secondary | ICD-10-CM | POA: Insufficient documentation

## 2013-10-13 DIAGNOSIS — M1710 Unilateral primary osteoarthritis, unspecified knee: Secondary | ICD-10-CM | POA: Insufficient documentation

## 2013-10-13 DIAGNOSIS — J4489 Other specified chronic obstructive pulmonary disease: Secondary | ICD-10-CM | POA: Insufficient documentation

## 2013-10-13 DIAGNOSIS — F17200 Nicotine dependence, unspecified, uncomplicated: Secondary | ICD-10-CM | POA: Insufficient documentation

## 2013-10-13 DIAGNOSIS — I10 Essential (primary) hypertension: Secondary | ICD-10-CM | POA: Insufficient documentation

## 2013-10-13 DIAGNOSIS — M23349 Other meniscus derangements, anterior horn of lateral meniscus, unspecified knee: Secondary | ICD-10-CM | POA: Diagnosis not present

## 2013-10-13 DIAGNOSIS — Z01812 Encounter for preprocedural laboratory examination: Secondary | ICD-10-CM | POA: Insufficient documentation

## 2013-10-13 DIAGNOSIS — K219 Gastro-esophageal reflux disease without esophagitis: Secondary | ICD-10-CM | POA: Insufficient documentation

## 2013-10-13 DIAGNOSIS — Z880 Allergy status to penicillin: Secondary | ICD-10-CM | POA: Insufficient documentation

## 2013-10-13 DIAGNOSIS — G4733 Obstructive sleep apnea (adult) (pediatric): Secondary | ICD-10-CM | POA: Insufficient documentation

## 2013-10-13 DIAGNOSIS — J438 Other emphysema: Secondary | ICD-10-CM | POA: Insufficient documentation

## 2013-10-13 DIAGNOSIS — Z6834 Body mass index (BMI) 34.0-34.9, adult: Secondary | ICD-10-CM | POA: Insufficient documentation

## 2013-10-13 HISTORY — DX: Presence of spectacles and contact lenses: Z97.3

## 2013-10-13 HISTORY — DX: Other tear of lateral meniscus, current injury, right knee, initial encounter: S83.281A

## 2013-10-13 HISTORY — DX: Presence of dental prosthetic device (complete) (partial): Z97.2

## 2013-10-13 HISTORY — PX: KNEE ARTHROSCOPY WITH LATERAL MENISECTOMY: SHX6193

## 2013-10-13 LAB — GLUCOSE, CAPILLARY
GLUCOSE-CAPILLARY: 155 mg/dL — AB (ref 70–99)
Glucose-Capillary: 137 mg/dL — ABNORMAL HIGH (ref 70–99)

## 2013-10-13 LAB — POCT HEMOGLOBIN-HEMACUE: Hemoglobin: 12.1 g/dL (ref 12.0–15.0)

## 2013-10-13 SURGERY — ARTHROSCOPY, KNEE, WITH LATERAL MENISCECTOMY
Anesthesia: General | Site: Knee | Laterality: Right

## 2013-10-13 MED ORDER — PROPOFOL 10 MG/ML IV BOLUS
INTRAVENOUS | Status: DC | PRN
  Administered 2013-10-13: 250 mg via INTRAVENOUS

## 2013-10-13 MED ORDER — MIDAZOLAM HCL 2 MG/2ML IJ SOLN: 1.0000 mg | INTRAMUSCULAR | Status: DC | PRN

## 2013-10-13 MED ORDER — LACTATED RINGERS IV SOLN
INTRAVENOUS | Status: DC
  Administered 2013-10-13: 11:00:00 via INTRAVENOUS

## 2013-10-13 MED ORDER — FENTANYL CITRATE 0.05 MG/ML IJ SOLN: 50.0000 ug | INTRAMUSCULAR | Status: DC | PRN

## 2013-10-13 MED ORDER — OXYCODONE-ACETAMINOPHEN 10-325 MG PO TABS: 1.0000 | ORAL_TABLET | Freq: Four times a day (QID) | ORAL | Status: DC | PRN

## 2013-10-13 MED ORDER — LIDOCAINE HCL (CARDIAC) 20 MG/ML IV SOLN
INTRAVENOUS | Status: DC | PRN
  Administered 2013-10-13: 100 mg via INTRAVENOUS

## 2013-10-13 MED ORDER — HYDROMORPHONE HCL PF 1 MG/ML IJ SOLN
INTRAMUSCULAR | Status: AC
  Filled 2013-10-13: qty 1

## 2013-10-13 MED ORDER — HYDROMORPHONE HCL PF 1 MG/ML IJ SOLN
0.2500 mg | INTRAMUSCULAR | Status: DC | PRN
  Administered 2013-10-13 (×2): 0.5 mg via INTRAVENOUS

## 2013-10-13 MED ORDER — OXYCODONE HCL 5 MG PO TABS
5.0000 mg | ORAL_TABLET | Freq: Once | ORAL | Status: AC | PRN
  Administered 2013-10-13: 5 mg via ORAL

## 2013-10-13 MED ORDER — KETOROLAC TROMETHAMINE 10 MG PO TABS: 10.0000 mg | ORAL_TABLET | Freq: Four times a day (QID) | ORAL | Status: DC | PRN

## 2013-10-13 MED ORDER — OXYCODONE HCL 5 MG PO TABS
ORAL_TABLET | ORAL | Status: AC
  Filled 2013-10-13: qty 1

## 2013-10-13 MED ORDER — CEFAZOLIN SODIUM-DEXTROSE 2-3 GM-% IV SOLR
2.0000 g | INTRAVENOUS | Status: AC
  Administered 2013-10-13: 2 g via INTRAVENOUS

## 2013-10-13 MED ORDER — BUPIVACAINE HCL (PF) 0.5 % IJ SOLN
INTRAMUSCULAR | Status: AC
  Filled 2013-10-13: qty 30

## 2013-10-13 MED ORDER — BUPIVACAINE HCL (PF) 0.5 % IJ SOLN
INTRAMUSCULAR | Status: DC | PRN
  Administered 2013-10-13: 20 mL via INTRA_ARTICULAR

## 2013-10-13 MED ORDER — DEXAMETHASONE SODIUM PHOSPHATE 4 MG/ML IJ SOLN
INTRAMUSCULAR | Status: DC | PRN
  Administered 2013-10-13: 10 mg via INTRAVENOUS

## 2013-10-13 MED ORDER — OXYCODONE HCL 5 MG/5ML PO SOLN: 5.0000 mg | Freq: Once | ORAL | Status: AC | PRN

## 2013-10-13 MED ORDER — KETOROLAC TROMETHAMINE 30 MG/ML IJ SOLN
INTRAMUSCULAR | Status: DC | PRN
  Administered 2013-10-13: 30 mg via INTRAVENOUS

## 2013-10-13 MED ORDER — FENTANYL CITRATE 0.05 MG/ML IJ SOLN
INTRAMUSCULAR | Status: AC
  Filled 2013-10-13: qty 4

## 2013-10-13 MED ORDER — FENTANYL CITRATE 0.05 MG/ML IJ SOLN
INTRAMUSCULAR | Status: DC | PRN
  Administered 2013-10-13: 25 ug via INTRAVENOUS
  Administered 2013-10-13: 50 ug via INTRAVENOUS
  Administered 2013-10-13: 25 ug via INTRAVENOUS

## 2013-10-13 MED ORDER — LABETALOL HCL 5 MG/ML IV SOLN
INTRAVENOUS | Status: DC | PRN
  Administered 2013-10-13: 5 mg via INTRAVENOUS

## 2013-10-13 MED ORDER — MIDAZOLAM HCL 2 MG/2ML IJ SOLN
INTRAMUSCULAR | Status: AC
  Filled 2013-10-13: qty 2

## 2013-10-13 MED ORDER — CEFAZOLIN SODIUM-DEXTROSE 2-3 GM-% IV SOLR
INTRAVENOUS | Status: AC
  Filled 2013-10-13: qty 50

## 2013-10-13 MED ORDER — PROMETHAZINE HCL 25 MG PO TABS: 25.0000 mg | ORAL_TABLET | Freq: Four times a day (QID) | ORAL | Status: DC | PRN

## 2013-10-13 MED ORDER — SENNA-DOCUSATE SODIUM 8.6-50 MG PO TABS: 2.0000 | ORAL_TABLET | Freq: Every day | ORAL | Status: DC

## 2013-10-13 MED ORDER — SODIUM CHLORIDE 0.9 % IR SOLN
Status: DC | PRN
  Administered 2013-10-13: 4500 mL

## 2013-10-13 MED ORDER — MIDAZOLAM HCL 5 MG/5ML IJ SOLN
INTRAMUSCULAR | Status: DC | PRN
  Administered 2013-10-13: 1 mg via INTRAVENOUS

## 2013-10-13 SURGICAL SUPPLY — 46 items
BANDAGE ELASTIC 6 VELCRO ST LF (GAUZE/BANDAGES/DRESSINGS) ×3 IMPLANT
BANDAGE ESMARK 6X9 LF (GAUZE/BANDAGES/DRESSINGS) IMPLANT
BENZOIN TINCTURE PRP APPL 2/3 (GAUZE/BANDAGES/DRESSINGS) ×3 IMPLANT
BLADE CUTTER GATOR 3.5 (BLADE) ×3 IMPLANT
BNDG ESMARK 6X9 LF (GAUZE/BANDAGES/DRESSINGS)
CANISTER SUCT 3000ML (MISCELLANEOUS) IMPLANT
CLOSURE WOUND 1/2 X4 (GAUZE/BANDAGES/DRESSINGS) ×1
CUFF TOURNIQUET SINGLE 34IN LL (TOURNIQUET CUFF) IMPLANT
CUTTER KNOT PUSHER 2-0 FIBERWI (INSTRUMENTS) IMPLANT
CUTTER MENISCUS  4.2MM (BLADE)
CUTTER MENISCUS 4.2MM (BLADE) IMPLANT
DRAPE ARTHROSCOPY W/POUCH 90 (DRAPES) ×3 IMPLANT
DRAPE U 20/CS (DRAPES) IMPLANT
DURAPREP 26ML APPLICATOR (WOUND CARE) ×3 IMPLANT
ELECT MENISCUS 165MM 90D (ELECTRODE) IMPLANT
ELECT REM PT RETURN 9FT ADLT (ELECTROSURGICAL)
ELECTRODE REM PT RTRN 9FT ADLT (ELECTROSURGICAL) IMPLANT
GAUZE SPONGE 4X4 12PLY STRL (GAUZE/BANDAGES/DRESSINGS) ×3 IMPLANT
GLOVE BIO SURGEON STRL SZ7 (GLOVE) ×3 IMPLANT
GLOVE BIO SURGEON STRL SZ8 (GLOVE) IMPLANT
GLOVE BIOGEL PI IND STRL 7.0 (GLOVE) ×1 IMPLANT
GLOVE BIOGEL PI IND STRL 8 (GLOVE) ×2 IMPLANT
GLOVE BIOGEL PI INDICATOR 7.0 (GLOVE) ×2
GLOVE BIOGEL PI INDICATOR 8 (GLOVE) ×4
GLOVE ECLIPSE 6.5 STRL STRAW (GLOVE) ×3 IMPLANT
GLOVE ORTHO TXT STRL SZ7.5 (GLOVE) ×6 IMPLANT
GOWN STRL REUS W/ TWL LRG LVL3 (GOWN DISPOSABLE) ×1 IMPLANT
GOWN STRL REUS W/ TWL XL LVL3 (GOWN DISPOSABLE) ×2 IMPLANT
GOWN STRL REUS W/TWL LRG LVL3 (GOWN DISPOSABLE) ×2
GOWN STRL REUS W/TWL XL LVL3 (GOWN DISPOSABLE) ×4
IV NS IRRIG 3000ML ARTHROMATIC (IV SOLUTION) ×6 IMPLANT
KNEE WRAP E Z 3 GEL PACK (MISCELLANEOUS) ×3 IMPLANT
MANIFOLD NEPTUNE II (INSTRUMENTS) ×3 IMPLANT
PACK ARTHROSCOPY DSU (CUSTOM PROCEDURE TRAY) ×3 IMPLANT
PACK BASIN DAY SURGERY FS (CUSTOM PROCEDURE TRAY) ×3 IMPLANT
PENCIL BUTTON HOLSTER BLD 10FT (ELECTRODE) IMPLANT
SET ARTHROSCOPY TUBING (MISCELLANEOUS) ×2
SET ARTHROSCOPY TUBING LN (MISCELLANEOUS) ×1 IMPLANT
SHEET MEDIUM DRAPE 40X70 STRL (DRAPES) ×3 IMPLANT
SLEEVE SCD COMPRESS KNEE MED (MISCELLANEOUS) ×3 IMPLANT
STRIP CLOSURE SKIN 1/2X4 (GAUZE/BANDAGES/DRESSINGS) ×2 IMPLANT
SUT MNCRL AB 4-0 PS2 18 (SUTURE) ×3 IMPLANT
TOWEL OR 17X24 6PK STRL BLUE (TOWEL DISPOSABLE) ×3 IMPLANT
TOWEL OR NON WOVEN STRL DISP B (DISPOSABLE) ×3 IMPLANT
WAND STAR VAC 90 (SURGICAL WAND) ×3 IMPLANT
WATER STERILE IRR 1000ML POUR (IV SOLUTION) ×3 IMPLANT

## 2013-10-13 NOTE — Anesthesia Procedure Notes (Signed)
Procedure Name: LMA Insertion Performed by: Bertil Brickey W Pre-anesthesia Checklist: Patient identified, Timeout performed, Emergency Drugs available, Suction available and Patient being monitored Patient Re-evaluated:Patient Re-evaluated prior to inductionOxygen Delivery Method: Circle system utilized Preoxygenation: Pre-oxygenation with 100% oxygen Intubation Type: IV induction Ventilation: Mask ventilation without difficulty LMA: LMA inserted LMA Size: 4.0 Tube type: Oral Number of attempts: 1 Placement Confirmation: breath sounds checked- equal and bilateral and positive ETCO2 Tube secured with: Tape Dental Injury: Teeth and Oropharynx as per pre-operative assessment      

## 2013-10-13 NOTE — Discharge Instructions (Signed)
Diet: As you were doing prior to hospitalization  ° °Shower:  May shower but keep the wounds dry, use an occlusive plastic wrap, NO SOAKING IN TUB.  If the bandage gets wet, change with a clean dry gauze. ° °Dressing:  You may change your dressing 3-5 days after surgery.  Then change the dressing daily with sterile gauze dressing.   ° °There are sticky tapes (steri-strips) on your wounds and all the stitches are absorbable.  Leave the steri-strips in place when changing your dressings, they will peel off with time, usually 2-3 weeks. ° °Activity:  Increase activity slowly as tolerated, but follow the weight bearing instructions below.  No lifting or driving for 6 weeks. ° °Weight Bearing:   As tolerated.   ° °To prevent constipation: you may use a stool softener such as - ° °Colace (over the counter) 100 mg by mouth twice a day  °Drink plenty of fluids (prune juice may be helpful) and high fiber foods °Miralax (over the counter) for constipation as needed.   ° °Itching:  If you experience itching with your medications, try taking only a single pain pill, or even half a pain pill at a time.  You may take up to 10 pain pills per day, and you can also use benadryl over the counter for itching or also to help with sleep.  ° °Precautions:  If you experience chest pain or shortness of breath - call 911 immediately for transfer to the hospital emergency department!! ° °If you develop a fever greater that 101 F, purulent drainage from wound, increased redness or drainage from wound, or calf pain -- Call the office at [phone]                                                °Follow- Up Appointment:  Please call for an appointment to be seen in 2 weeks [place] - [phone] ° ° ° ° ° °Post Anesthesia Home Care Instructions ° °Activity: °Get plenty of rest for the remainder of the day. A responsible adult should stay with you for 24 hours following the procedure.  °For the next 24 hours, DO NOT: °-Drive a car °-Operate  machinery °-Drink alcoholic beverages °-Take any medication unless instructed by your physician °-Make any legal decisions or sign important papers. ° °Meals: °Start with liquid foods such as gelatin or soup. Progress to regular foods as tolerated. Avoid greasy, spicy, heavy foods. If nausea and/or vomiting occur, drink only clear liquids until the nausea and/or vomiting subsides. Call your physician if vomiting continues. ° °Special Instructions/Symptoms: °Your throat may feel dry or sore from the anesthesia or the breathing tube placed in your throat during surgery. If this causes discomfort, gargle with warm salt water. The discomfort should disappear within 24 hours. ° °

## 2013-10-13 NOTE — Anesthesia Postprocedure Evaluation (Signed)
  Anesthesia Post-op Note  Patient: Vanessa [name]  Procedure(s) Performed: Procedure(s): RIGHT KNEE ARTHROSCOPY WITH PARTIAL LATERAL MENISECTOMY/DEBRIDEMENT/SHAVING  (Right)  Patient Location: PACU  Anesthesia Type:General  Level of Consciousness: awake, alert  and oriented  Airway and Oxygen Therapy: Patient Spontanous Breathing  Post-op Pain: mild  Post-op Assessment: Post-op Vital signs reviewed, Patient's Cardiovascular Status Stable, Respiratory Function Stable, Patent Airway and Pain level controlled  Post-op Vital Signs: stable  Last Vitals:  Filed Vitals:   10/13/13 1552  BP: 140/84  Pulse: 85  Temp: 36.7 C  Resp: 18    Complications: No apparent anesthesia complications

## 2013-10-13 NOTE — H&P (Signed)
PREOPERATIVE H&P  Chief Complaint: Right Knee: Tear Meniscus Lateral/Anthorn/PostHorn, Chondromalacia - Patella  HPI: Vanessa [name] is a 58 y.o. female who presents for preoperative history and physical with a diagnosis of Right Knee: Tear Meniscus Lateral/Anthorn/PostHorn, Chondromalacia - Patella. Symptoms are rated as moderate to severe, and have been worsening.  This is significantly impairing activities of daily living.  She has elected for surgical management.   Past Medical History  Diagnosis Date  . Hypertension   . Heart murmur   . Asthma   . Emphysema   . Diabetes mellitus   . Hyperlipidemia   . Allergic rhinitis   . Chronic headache   . Neuropathy   . Grave's disease   . Herniated disc   . COPD (chronic obstructive pulmonary disease)   . Menopause   . Hernia, umbilical   . Anxiety   . Depression   . Back pain, chronic   . Shingles   . Herniated disc   . Arthritis     rheumatoid  . 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 Generalized anxiety disorder 05/26/2012  . Chronic back pain 05/27/2012    Secondary to L4-5 herniated disc per patient  . OSA (obstructive sleep apnea)     has a cpap  . Sleep apnea   . Wears glasses   . Wears dentures     top   Past Surgical History  Procedure Laterality Date  . Cholecystectomy    . Tubal ligation    . Foot surgery      cyst rt foot  . Laparoscopy      age 60  . Eye surgery      eye back in socker-rt   History   Social History  . Marital Status: Single    Spouse Name: N/A    Number of Children: 6  . Years of Education: N/A   Occupational History  . disabled. foster parent    Social History Main Topics  . Smoking status: Current Every Day Smoker -- 1.00 packs/day for 40 years    Types: Cigarettes  . Smokeless tobacco: Never Used  . Alcohol Use: No  . Drug Use: No  . Sexual Activity: No   Other Topics Concern  . None   Social History  Narrative   Pt is separated and lives alone.         Family History  Problem Relation Age of Onset  . Emphysema Mother   . Allergies Mother   . Allergies Daughter   . Asthma Mother   . Asthma Brother   . Heart disease Mother   . Rheum arthritis Mother   . Breast cancer Other     aunt  . Lung cancer Brother   . Throat cancer Brother    Allergies  Allergen Reactions  . Ciprofloxacin Hives  . Penicillins Hives   Prior to Admission medications   Medication Sig Start Date End Date Taking? Authorizing Provider  acyclovir (ZOVIRAX) 400 MG tablet Take 800 mg by mouth every morning.    Yes Historical Provider, MD  albuterol (PROAIR HFA) 108 (90 BASE) MCG/ACT inhaler Inhale 2 puffs into the lungs 4 (four) times daily. For COPD   Yes Historical Provider, MD  atorvastatin (LIPITOR) 40 MG tablet Take 1 tablet (40 mg total) by mouth daily. 10/06/12  Yes Carlynn Purl, DO  beclomethasone (QVAR) 80 MCG/ACT inhaler Inhale 2 puffs into the lungs 2 (two) times daily.  Yes Historical Provider, MD  busPIRone (BUSPAR) 10 MG tablet Take 20 mg by mouth 3 (three) times daily.    Yes Historical Provider, MD  diclofenac sodium (VOLTAREN) 1 % GEL Apply topically 4 (four) times daily.   Yes Historical Provider, MD  FLUoxetine (PROZAC) 20 MG capsule Take 60 mg by mouth every morning.    Yes Historical Provider, MD  glipiZIDE (GLUCOTROL) 10 MG tablet Take 10 mg by mouth 2 (two) times daily before a meal.   Yes Historical Provider, MD  hydrochlorothiazide (HYDRODIURIL) 25 MG tablet Take 25 mg by mouth every morning.    Yes Historical Provider, MD  HYDROcodone-acetaminophen (NORCO) 10-325 MG per tablet Take 1 tablet by mouth every 6 (six) hours as needed for moderate pain.    Yes Historical Provider, MD  levothyroxine (SYNTHROID, LEVOTHROID) 175 MCG tablet Take 175 mcg by mouth every morning.    Yes Historical Provider, MD  lisinopril (PRINIVIL,ZESTRIL) 10 MG tablet Take 10 mg by mouth daily.   Yes Historical  Provider, MD  loratadine (CLARITIN) 10 MG tablet Take 10 mg by mouth daily as needed for allergies.   Yes Historical Provider, MD  meloxicam (MOBIC) 15 MG tablet Take 15 mg by mouth daily.   Yes Historical Provider, MD  metFORMIN (GLUCOPHAGE) 1000 MG tablet Take 1,000 mg by mouth 2 (two) times daily.    Yes Historical Provider, MD  mirtazapine (REMERON) 45 MG tablet Take 45 mg by mouth at bedtime as needed (Sleep). For insomnia   Yes Historical Provider, MD  pantoprazole (PROTONIX) 40 MG tablet Take 1 tablet (40 mg total) by mouth 2 (two) times daily. 07/25/13  Yes Zannie Cove, MD  pregabalin (LYRICA) 25 MG capsule Take 75 mg by mouth 3 (three) times daily.    Yes Historical Provider, MD  traMADol (ULTRAM) 50 MG tablet Take 50 mg by mouth every 6 (six) hours as needed for pain.   Yes Historical Provider, MD  verapamil (CALAN) 40 MG tablet Take 40 mg by mouth 3 (three) times daily.   Yes Historical Provider, MD  benzonatate (TESSALON) 100 MG capsule Take 1 capsule (100 mg total) by mouth 2 (two) times daily as needed for cough. 07/25/13   Zannie Cove, MD  ipratropium-albuterol (DUONEB) 0.5-2.5 (3) MG/3ML SOLN Take 3 mLs by nebulization every 6 (six) hours as needed (Asthma).    Historical Provider, MD  QUEtiapine (SEROQUEL) 25 MG tablet Take 25 mg by mouth at bedtime.    Historical Provider, MD     Positive ROS: All other systems have been reviewed and were otherwise negative with the exception of those mentioned in the HPI and as above.  Physical Exam: General: Alert, no acute distress Cardiovascular: No pedal edema Respiratory: No cyanosis, no use of accessory musculature GI: No organomegaly, abdomen is soft and non-tender Skin: No lesions in the area of chief complaint Neurologic: Sensation intact distally Psychiatric: Patient is competent for consent with normal mood and affect Lymphatic: No axillary or cervical lymphadenopathy  MUSCULOSKELETAL: Right knee has intact Lockman,  positive pain to palpation laterally, less pain medially, mild diffuse swelling. Range of motion 0-130  Assessment: Right Knee: Tear Meniscus Lateral/Anthorn/PostHorn, Chondromalacia - Patella  Plan: Plan for Procedure(s): RIGHT KNEE ARTHROSCOPY WITH LATERAL MENISECTOMY/DEBRIDEMENT/SHAVING (CHONDROPLASTY)  The risks benefits and alternatives were discussed with the patient including but not limited to the risks of nonoperative treatment, versus surgical intervention including infection, bleeding, nerve injury,  blood clots, cardiopulmonary complications, morbidity, mortality, among others, and they were willing  to proceed.   Eulas Post, MD Cell [phone]   10/13/2013 12:14 PM

## 2013-10-13 NOTE — Transfer of Care (Signed)
Immediate Anesthesia Transfer of Care Note  Patient: Vanessa [name]  Procedure(s) Performed: Procedure(s): RIGHT KNEE ARTHROSCOPY WITH PARTIAL LATERAL MENISECTOMY/DEBRIDEMENT/SHAVING  (Right)  Patient Location: PACU  Anesthesia Type:General  Level of Consciousness: awake and alert   Airway & Oxygen Therapy: Patient Spontanous Breathing and Patient connected to face mask oxygen  Post-op Assessment: Report given to PACU RN and Post -op Vital signs reviewed and stable  Post vital signs: Reviewed and stable  Complications: No apparent anesthesia complications

## 2013-10-13 NOTE — Anesthesia Preprocedure Evaluation (Addendum)
Anesthesia Evaluation  Patient identified by MRN, date of birth, ID band Patient awake    Reviewed: Allergy & Precautions, H&P , NPO status , Patient's Chart, lab work & pertinent test results  Airway Mallampati: III TM Distance: >3 FB Neck ROM: Full    Dental no notable dental hx. (+) Edentulous Upper, Partial Lower, Dental Advisory Given   Pulmonary asthma , sleep apnea and Continuous Positive Airway Pressure Ventilation , COPD COPD inhaler, Current Smoker,  breath sounds clear to auscultation  Pulmonary exam normal       Cardiovascular hypertension, Pt. on medications Rhythm:Regular Rate:Normal     Neuro/Psych  Headaches, PSYCHIATRIC DISORDERS Anxiety Depression    GI/Hepatic Neg liver ROS, GERD-  Medicated and Controlled,  Endo/Other  diabetesHypothyroidism Morbid obesity  Renal/GU negative Renal ROS  negative genitourinary   Musculoskeletal   Abdominal   Peds  Hematology negative hematology ROS (+)   Anesthesia Other Findings   Reproductive/Obstetrics negative OB ROS                          Anesthesia Physical Anesthesia Plan  ASA: III  Anesthesia Plan: General   Post-op Pain Management:    Induction: Intravenous  Airway Management Planned: LMA  Additional Equipment:   Intra-op Plan:   Post-operative Plan: Extubation in OR  Informed Consent: I have reviewed the patients History and Physical, chart, labs and discussed the procedure including the risks, benefits and alternatives for the proposed anesthesia with the patient or authorized representative who has indicated his/her understanding and acceptance.   Dental advisory given  Plan Discussed with: CRNA  Anesthesia Plan Comments:         Anesthesia Quick Evaluation

## 2013-10-13 NOTE — Op Note (Signed)
10/13/2013  2:11 PM  PATIENT:  Vanessa [name]    PRE-OPERATIVE DIAGNOSIS:  Right Knee: Tear Meniscus Lateral/Anthorn/PostHorn, Chondromalacia - Patella  POST-OPERATIVE DIAGNOSIS:  Same  PROCEDURE:  RIGHT KNEE ARTHROSCOPY WITH PARTIAL LATERAL MENISECTOMY/DEBRIDEMENT/SHAVING   SURGEON:  Eulas Post, MD  PHYSICIAN ASSISTANT: HC Martensen, PA-student  ANESTHESIA:   General  PREOPERATIVE INDICATIONS:  [name] is a  58 y.o. female with a diagnosis of Right Knee: Tear Meniscus Lateral/Anthorn/PostHorn, Chondromalacia - Patella who failed conservative measures and elected for surgical management.    The risks benefits and alternatives were discussed with the patient preoperatively including but not limited to the risks of infection, bleeding, nerve injury, cardiopulmonary complications, the need for revision surgery, among others, and the patient was willing to proceed. We also discussed the risks for progression of arthritis, incomplete relief of pain, need for future arthroplasty.  OPERATIVE IMPLANTS: None  OPERATIVE FINDINGS: Grade 2 chondral changes with fissuring on the undersurface of the patella and on the femoral trochlea, there was a small area of grade 4 changes on the tibial plateau laterally, there were extensive grade 2 changes on the femur laterally, the anterior cruciate ligament and PCL were intact. The medial side was in reasonably good condition. The lateral meniscus had a complex tear from the posterior horn around to the midsubstance of the body extending to the level of the capsule, completely disrupting the hood fibers of the meniscus.  OPERATIVE PROCEDURE: The patient is brought to the operating room  And placed in the supine position. General anesthesia was administered. IV antibiotics were given. The right lower extremity is prepped and draped in usual sterile fashion. Time out performed. Diagnostic arthroscopy carried out the above-named findings. I used  the arthroscopic shaver to debride the lateral meniscus back to a stable configuration. I considered doing a microfracture, however the walls on the tibial plateau were not great, and she was not going to be able to be compliant with nonweightbearing, and I did not feel that microfracture would improve her ultimate outcome. 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  After complete debridement of the meniscus was carried out, I didn't perform a light chondroplasty on the femoral trochlea, remove the arthroscopic instruments, injected the knee with Marcaine, and closed the portals with Monocryl followed by Steri-Strips and sterile gauze. She was awakened and returned to the PACU in stable and satisfactory condition. There were no complications and she tolerated the procedure well.

## 2013-10-16 ENCOUNTER — Encounter (HOSPITAL_BASED_OUTPATIENT_CLINIC_OR_DEPARTMENT_OTHER): Payer: Self-pay | Admitting: Orthopedic Surgery

## 2013-10-24 NOTE — Addendum Note (Signed)
Addendum created 10/24/13 2232 by Kipp Brood, MD   Modules edited: Anesthesia Responsible Staff

## 2013-11-15 ENCOUNTER — Emergency Department (HOSPITAL_COMMUNITY)
Admission: EM | Admit: 2013-11-15 | Discharge: 2013-11-16 | Disposition: A | Discharge status: Home / Self Care | Payer: Medicaid Other | Attending: Emergency Medicine | Admitting: Emergency Medicine

## 2013-11-15 ENCOUNTER — Encounter (HOSPITAL_COMMUNITY): Payer: Self-pay | Admitting: Emergency Medicine

## 2013-11-15 DIAGNOSIS — Z9889 Other specified postprocedural states: Secondary | ICD-10-CM | POA: Insufficient documentation

## 2013-11-15 DIAGNOSIS — F411 Generalized anxiety disorder: Secondary | ICD-10-CM | POA: Diagnosis not present

## 2013-11-15 DIAGNOSIS — Z98811 Dental restoration status: Secondary | ICD-10-CM | POA: Diagnosis not present

## 2013-11-15 DIAGNOSIS — F329 Major depressive disorder, single episode, unspecified: Secondary | ICD-10-CM | POA: Diagnosis not present

## 2013-11-15 DIAGNOSIS — Z8619 Personal history of other infectious and parasitic diseases: Secondary | ICD-10-CM | POA: Insufficient documentation

## 2013-11-15 DIAGNOSIS — I10 Essential (primary) hypertension: Secondary | ICD-10-CM | POA: Diagnosis not present

## 2013-11-15 DIAGNOSIS — R011 Cardiac murmur, unspecified: Secondary | ICD-10-CM | POA: Diagnosis not present

## 2013-11-15 DIAGNOSIS — F3289 Other specified depressive episodes: Secondary | ICD-10-CM | POA: Diagnosis not present

## 2013-11-15 DIAGNOSIS — F17200 Nicotine dependence, unspecified, uncomplicated: Secondary | ICD-10-CM | POA: Diagnosis not present

## 2013-11-15 DIAGNOSIS — E119 Type 2 diabetes mellitus without complications: Secondary | ICD-10-CM | POA: Diagnosis not present

## 2013-11-15 DIAGNOSIS — Z79899 Other long term (current) drug therapy: Secondary | ICD-10-CM | POA: Diagnosis not present

## 2013-11-15 DIAGNOSIS — Z880 Allergy status to penicillin: Secondary | ICD-10-CM | POA: Diagnosis not present

## 2013-11-15 DIAGNOSIS — G589 Mononeuropathy, unspecified: Secondary | ICD-10-CM | POA: Diagnosis not present

## 2013-11-15 DIAGNOSIS — G8929 Other chronic pain: Secondary | ICD-10-CM | POA: Insufficient documentation

## 2013-11-15 DIAGNOSIS — G4733 Obstructive sleep apnea (adult) (pediatric): Secondary | ICD-10-CM | POA: Diagnosis not present

## 2013-11-15 DIAGNOSIS — E785 Hyperlipidemia, unspecified: Secondary | ICD-10-CM | POA: Insufficient documentation

## 2013-11-15 DIAGNOSIS — G8918 Other acute postprocedural pain: Secondary | ICD-10-CM | POA: Diagnosis not present

## 2013-11-15 DIAGNOSIS — J4489 Other specified chronic obstructive pulmonary disease: Secondary | ICD-10-CM | POA: Insufficient documentation

## 2013-11-15 DIAGNOSIS — E0500 Thyrotoxicosis with diffuse goiter without thyrotoxic crisis or storm: Secondary | ICD-10-CM | POA: Insufficient documentation

## 2013-11-15 DIAGNOSIS — J449 Chronic obstructive pulmonary disease, unspecified: Secondary | ICD-10-CM | POA: Insufficient documentation

## 2013-11-15 DIAGNOSIS — M25569 Pain in unspecified knee: Secondary | ICD-10-CM | POA: Insufficient documentation

## 2013-11-15 DIAGNOSIS — M25561 Pain in right knee: Secondary | ICD-10-CM

## 2013-11-15 DIAGNOSIS — Z87828 Personal history of other (healed) physical injury and trauma: Secondary | ICD-10-CM | POA: Diagnosis not present

## 2013-11-15 DIAGNOSIS — Z8739 Personal history of other diseases of the musculoskeletal system and connective tissue: Secondary | ICD-10-CM | POA: Insufficient documentation

## 2013-11-15 NOTE — ED Notes (Signed)
Rt. Knee pain.  Had surgery to rt knee a month ago. Called pcp and prescribe 5/325 Roxicet. Pain med. Not helping. Pain med making stomach hurt.

## 2013-11-16 ENCOUNTER — Emergency Department (HOSPITAL_COMMUNITY): Payer: Medicaid Other

## 2013-11-16 MED ORDER — HYDROMORPHONE HCL PF 1 MG/ML IJ SOLN
1.0000 mg | Freq: Once | INTRAMUSCULAR | Status: AC
  Administered 2013-11-16: 1 mg via INTRAMUSCULAR
  Filled 2013-11-16: qty 1

## 2013-11-16 NOTE — ED Provider Notes (Signed)
CSN: [id number]     Arrival date & time 11/15/13  2256 History   First MD Initiated Contact with Patient 11/15/13 2340     Chief Complaint  Patient presents with  . Knee Pain     (Consider location/radiation/quality/duration/timing/severity/associated sxs/prior Treatment) HPI Comments: Patient is a 57 year old female past medical history significant for hypertension, DM, hyperlipidemia, COPD, anxiety, depression, chronic pain syndrome and arthritis presented to the emergency department for right knee pain. Patient is for 4 weeks postop from a right knee surgery performed by Dr. Dion Saucier. She states she is at increased pain and swelling to the knee despite following the RICE method and using home pain medicine. She has discussed this with Dr. Dion Saucier and then seen in a followup appointment. He is not due to go back for another 3 weeks. Denies any fevers or chills, numbness or weakness   Past Medical History  Diagnosis Date  . Hypertension   . Heart murmur   . Asthma   . Emphysema   . Diabetes mellitus   . Hyperlipidemia   . Allergic rhinitis   . Chronic headache   . Neuropathy   . Grave's disease   . Herniated disc   . COPD (chronic obstructive pulmonary disease)   . Menopause   . Hernia, umbilical   . Anxiety   . Depression   . Back pain, chronic   . Shingles   . Herniated disc   . Arthritis     rheumatoid  . 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 Generalized anxiety disorder 05/26/2012  . Chronic back pain 05/27/2012    Secondary to L4-5 herniated disc per patient  . OSA (obstructive sleep apnea)     has a cpap  . Sleep apnea   . Wears glasses   . Wears dentures     top  . Tear of lateral meniscus of right knee 10/13/2013   Past Surgical History  Procedure Laterality Date  . Cholecystectomy    . Tubal ligation    . Foot surgery      cyst rt foot  . Laparoscopy      age 57  . Eye surgery      eye back  in socker-rt  . Knee arthroscopy with lateral menisectomy Right 10/13/2013    Procedure: RIGHT KNEE ARTHROSCOPY WITH PARTIAL LATERAL MENISECTOMY/DEBRIDEMENT/SHAVING ;  Surgeon: Eulas Post, MD;  Location: [facility];  Service: Orthopedics;  Laterality: Right;   Family History  Problem Relation Age of Onset  . Emphysema Mother   . Allergies Mother   . Allergies Daughter   . Asthma Mother   . Asthma Brother   . Heart disease Mother   . Rheum arthritis Mother   . Breast cancer Other     aunt  . Lung cancer Brother   . Throat cancer Brother    History  Substance Use Topics  . Smoking status: Current Every Day Smoker -- 1.00 packs/day for 40 years    Types: Cigarettes  . Smokeless tobacco: Never Used  . Alcohol Use: No   OB History   Grav Para Term Preterm Abortions TAB SAB Ect Mult Living   5 3   2  2   3      Review of Systems  Constitutional: Negative for fever and chills.  Musculoskeletal: Positive for arthralgias, joint swelling and myalgias.  Skin: Negative for color change.  All other systems reviewed and  are negative.     Allergies  Ciprofloxacin and Penicillins  Home Medications   Prior to Admission medications   Medication Sig Start Date End Date Taking? Authorizing Provider  acyclovir (ZOVIRAX) 400 MG tablet Take 800 mg by mouth every morning.    Yes Historical Provider, MD  albuterol (PROAIR HFA) 108 (90 BASE) MCG/ACT inhaler Inhale 2 puffs into the lungs 4 (four) times daily. For COPD   Yes Historical Provider, MD  atorvastatin (LIPITOR) 40 MG tablet Take 1 tablet (40 mg total) by mouth daily. 10/06/12  Yes Gust Rung, DO  beclomethasone (QVAR) 80 MCG/ACT inhaler Inhale 2 puffs into the lungs 2 (two) times daily.    Yes Historical Provider, MD  busPIRone (BUSPAR) 10 MG tablet Take 20 mg by mouth 3 (three) times daily.    Yes Historical Provider, MD  FLUoxetine (PROZAC) 20 MG capsule Take 60 mg by mouth every morning.    Yes Historical  Provider, MD  glipiZIDE (GLUCOTROL) 10 MG tablet Take 10 mg by mouth 2 (two) times daily before a meal.   Yes Historical Provider, MD  hydrochlorothiazide (HYDRODIURIL) 25 MG tablet Take 25 mg by mouth every morning.    Yes Historical Provider, MD  ipratropium-albuterol (DUONEB) 0.5-2.5 (3) MG/3ML SOLN Take 3 mLs by nebulization every 6 (six) hours as needed (Asthma).   Yes Historical Provider, MD  levothyroxine (SYNTHROID, LEVOTHROID) 175 MCG tablet Take 175 mcg by mouth every morning.    Yes Historical Provider, MD  lisinopril (PRINIVIL,ZESTRIL) 10 MG tablet Take 10 mg by mouth daily.   Yes Historical Provider, MD  loratadine (CLARITIN) 10 MG tablet Take 10 mg by mouth daily as needed for allergies.   Yes Historical Provider, MD  metFORMIN (GLUCOPHAGE) 1000 MG tablet Take 1,000 mg by mouth 2 (two) times daily.    Yes Historical Provider, MD  mirtazapine (REMERON) 45 MG tablet Take 45 mg by mouth at bedtime as needed (Sleep). For insomnia   Yes Historical Provider, MD  pantoprazole (PROTONIX) 40 MG tablet Take 1 tablet (40 mg total) by mouth 2 (two) times daily. 07/25/13  Yes Zannie Cove, MD  pregabalin (LYRICA) 25 MG capsule Take 75 mg by mouth 3 (three) times daily.    Yes Historical Provider, MD  QUEtiapine (SEROQUEL) 25 MG tablet Take 25 mg by mouth at bedtime.   Yes Historical Provider, MD  sennosides-docusate sodium (SENOKOT-S) 8.6-50 MG tablet Take 2 tablets by mouth daily. 10/13/13  Yes Eulas Post, MD  verapamil (CALAN) 40 MG tablet Take 40 mg by mouth 3 (three) times daily.   Yes Historical Provider, MD   BP 117/72  Pulse 93  Temp(Src) 98.2 F (36.8 C) (Oral)  Resp 18  Ht 5\' 9"  (1.753 m)  Wt 230 lb (104.327 kg)  BMI 33.95 kg/m2  SpO2 95% Physical Exam  Nursing note and vitals reviewed. Constitutional: She is oriented to person, place, and time. She appears well-developed and well-nourished. No distress.  HENT:  Head: Normocephalic and atraumatic.  Right Ear: External ear  normal.  Left Ear: External ear normal.  Nose: Nose normal.  Mouth/Throat: Oropharynx is clear and moist.  Eyes: Conjunctivae are normal.  Neck: Normal range of motion. Neck supple.  Cardiovascular: Normal rate, regular rhythm, normal heart sounds and intact distal pulses.   Pulmonary/Chest: Effort normal and breath sounds normal.  Abdominal: Soft.  Musculoskeletal: Normal range of motion.       Right knee: She exhibits swelling (mild). She exhibits normal range of  motion, no effusion, no ecchymosis, no deformity, no laceration, no erythema, normal alignment, no LCL laxity, normal patellar mobility and no bony tenderness. Tenderness found.       Left knee: Normal.       Right ankle: Normal.       Left ankle: Normal.       Right upper leg: Normal.       Left upper leg: Normal.       Right lower leg: Normal.       Left lower leg: Normal.       Legs: Neurological: She is alert and oriented to person, place, and time.  Skin: Skin is warm and dry. She is not diaphoretic.  Psychiatric: She has a normal mood and affect.    ED Course  Procedures (including critical care time) Medications  HYDROmorphone (DILAUDID) injection 1 mg (1 mg Intramuscular Given 11/16/13 0031)    Labs Review Labs Reviewed - No data to display  Imaging Review Dg Knee Complete 4 Views Right  11/16/2013   CLINICAL DATA:  Right knee pain.  Fell.  EXAM: RIGHT KNEE - COMPLETE 4+ VIEW  COMPARISON:  None.  FINDINGS: Moderate degenerative changes with joint space narrowing and spurring. No acute fracture is identified. There is a moderate-sized joint effusion.  IMPRESSION: Moderate degenerative changes but no acute fracture.  Moderate-sized joint effusion.   Electronically Signed   By: Loralie Champagne M.D.   On: 11/16/2013 00:45     EKG Interpretation None      Discussed x-ray findings with Patient, discussed options of arthrocentesis vs conservative measures with follow up with Dr. Dion Saucier, patient would like to try  conservative measures first.    MDM   Final diagnoses:  Right knee pain    Filed Vitals:   11/15/13 2306  BP: 117/72  Pulse: 93  Temp: 98.2 F (36.8 C)  Resp: 18   Afebrile, NAD, non-toxic appearing, AAOx4. Neurovascularly intact. Normal sensation. Swelling to right knee. Range of motion is intact. There is no erythema or warmth. No obvious effusion appreciated. Pain and symptoms managed in the emergency department. Moderate effusion noted on x-ray. There are no clinical signs or historical findings suggestive of infectious effusion. Discussed options with patient regarding arthrocentesis versus conservative measures patient would prefer to try conservative measures first. Will discharge patient home with advice to follow up with Dr. Dion Saucier. Patient is agreeable to plan and is stable at time of d/c.     Jeannetta Ellis, PA-C 11/16/13 [phone]

## 2013-11-16 NOTE — ED Provider Notes (Signed)
Medical screening examination/treatment/procedure(s) were performed by non-physician practitioner and as supervising physician I was immediately available for consultation/collaboration.  Flint Melter, MD 11/16/13 [phone]

## 2013-11-16 NOTE — Discharge Instructions (Signed)
Please follow up with your primary care physician in 1-2 days. If you do not have one please call the [facility] number listed above. Please follow up with Dr. Dion SaucierLandau to schedule a follow up appointment. Please read all discharge instructions and return precautions.   Knee Pain The knee is the complex joint between your thigh and your lower leg. It is made up of bones, tendons, ligaments, and cartilage. The bones that make up the knee are:  The femur in the thigh.  The tibia and fibula in the lower leg.  The patella or kneecap riding in the groove on the lower femur. CAUSES  Knee pain is a common complaint with many causes. A few of these causes are:  Injury, such as:  A ruptured ligament or tendon injury.  Torn cartilage.  Medical conditions, such as:  Gout  Arthritis  Infections  Overuse, over training, or overdoing a physical activity. Knee pain can be minor or severe. Knee pain can accompany debilitating injury. Minor knee problems often respond well to self-care measures or get well on their own. More serious injuries may need medical intervention or even surgery. SYMPTOMS The knee is complex. Symptoms of knee problems can vary widely. Some of the problems are:  Pain with movement and weight bearing.  Swelling and tenderness.  Buckling of the knee.  Inability to straighten or extend your knee.  Your knee locks and you cannot straighten it.  Warmth and redness with pain and fever.  Deformity or dislocation of the kneecap. DIAGNOSIS  Determining what is wrong may be very straight forward such as when there is an injury. It can also be challenging because of the complexity of the knee. Tests to make a diagnosis may include:  Your caregiver taking a history and doing a physical exam.  Routine X-rays can be used to rule out other problems. X-rays will not reveal a cartilage tear. Some injuries of the knee can be diagnosed by:  Arthroscopy a  surgical technique by which a small video camera is inserted through tiny incisions on the sides of the knee. This procedure is used to examine and repair internal knee joint problems. Tiny instruments can be used during arthroscopy to repair the torn knee cartilage (meniscus).  Arthrography is a radiology technique. A contrast liquid is directly injected into the knee joint. Internal structures of the knee joint then become visible on X-ray film.  An MRI scan is a non X-ray radiology procedure in which magnetic fields and a computer produce two- or three-dimensional images of the inside of the knee. Cartilage tears are often visible using an MRI scanner. MRI scans have largely replaced arthrography in diagnosing cartilage tears of the knee.  Blood work.  Examination of the fluid that helps to lubricate the knee joint (synovial fluid). This is done by taking a sample out using a needle and a syringe. TREATMENT The treatment of knee problems depends on the cause. Some of these treatments are:  Depending on the injury, proper casting, splinting, surgery, or physical therapy care will be needed.  Give yourself adequate recovery time. Do not overuse your joints. If you begin to get sore during workout routines, back off. Slow down or do fewer repetitions.  For repetitive activities such as cycling or running, maintain your strength and nutrition.  Alternate muscle groups. For example, if you are a weight lifter, work the upper body on one day and the lower body the next.  Either tight or  weak muscles do not give the proper support for your knee. Tight or weak muscles do not absorb the stress placed on the knee joint. Keep the muscles surrounding the knee strong.  Take care of mechanical problems.  If you have flat feet, orthotics or special shoes may help. See your caregiver if you need help.  Arch supports, sometimes with wedges on the inner or outer aspect of the heel, can help. These can  shift pressure away from the side of the knee most bothered by osteoarthritis.  A brace called an "unloader" brace also may be used to help ease the pressure on the most arthritic side of the knee.  If your caregiver has prescribed crutches, braces, wraps or ice, use as directed. The acronym for this is PRICE. This means protection, rest, ice, compression, and elevation.  Nonsteroidal anti-inflammatory drugs (NSAIDs), can help relieve pain. But if taken immediately after an injury, they may actually increase swelling. Take NSAIDs with food in your stomach. Stop them if you develop stomach problems. Do not take these if you have a history of ulcers, stomach pain, or bleeding from the bowel. Do not take without your caregiver's approval if you have problems with fluid retention, heart failure, or kidney problems.  For ongoing knee problems, physical therapy may be helpful.  Glucosamine and chondroitin are over-the-counter dietary supplements. Both may help relieve the pain of osteoarthritis in the knee. These medicines are different from the usual anti-inflammatory drugs. Glucosamine may decrease the rate of cartilage destruction.  Injections of a corticosteroid drug into your knee joint may help reduce the symptoms of an arthritis flare-up. They may provide pain relief that lasts a few months. You may have to wait a few months between injections. The injections do have a small increased risk of infection, water retention, and elevated blood sugar levels.  Hyaluronic acid injected into damaged joints may ease pain and provide lubrication. These injections may work by reducing inflammation. A series of shots may give relief for as long as 6 months.  Topical painkillers. Applying certain ointments to your skin may help relieve the pain and stiffness of osteoarthritis. Ask your pharmacist for suggestions. Many over the-counter products are approved for temporary relief of arthritis pain.  In some  countries, doctors often prescribe topical NSAIDs for relief of chronic conditions such as arthritis and tendinitis. A review of treatment with NSAID creams found that they worked as well as oral medications but without the serious side effects. PREVENTION  Maintain a healthy weight. Extra pounds put more strain on your joints.  Get strong, stay limber. Weak muscles are a common cause of knee injuries. Stretching is important. Include flexibility exercises in your workouts.  Be smart about exercise. If you have osteoarthritis, chronic knee pain or recurring injuries, you may need to change the way you exercise. This does not mean you have to stop being active. If your knees ache after jogging or playing basketball, consider switching to swimming, water aerobics, or other low-impact activities, at least for a few days a week. Sometimes limiting high-impact activities will provide relief.  Make sure your shoes fit well. Choose footwear that is right for your sport.  Protect your knees. Use the proper gear for knee-sensitive activities. Use kneepads when playing volleyball or laying carpet. Buckle your seat belt every time you drive. Most shattered kneecaps occur in car accidents.  Rest when you are tired. SEEK MEDICAL CARE IF:  You have knee pain that is continual and does  not seem to be getting better.  SEEK IMMEDIATE MEDICAL CARE IF:  Your knee joint feels hot to the touch and you have a high fever. MAKE SURE YOU:   Understand these instructions.  Will watch your condition.  Will get help right away if you are not doing well or get worse. Document Released: 01/11/2007 Document Revised: 06/08/2011 Document Reviewed: 01/11/2007 [facility] Patient Information 2015 [place], [place]. This information is not intended to replace advice given to you by your health care provider. Make sure you discuss any questions you have with your health care provider.  Knee Effusion The medical term for having  fluid in your knee is effusion. This is often due to an internal derangement of the knee. This means something is wrong inside the knee. Some of the causes of fluid in the knee may be torn cartilage, a torn ligament, or bleeding into the joint from an injury. Your knee is likely more difficult to bend and move. This is often because there is increased pain and pressure in the joint. The time it takes for recovery from a knee effusion depends on different factors, including:   Type of injury.  Your age.  Physical and medical conditions.  Rehabilitation Strategies. How long you will be away from your normal activities will depend on what kind of knee problem you have and how much damage is present. Your knee has two types of cartilage. Articular cartilage covers the bone ends and lets your knee bend and move smoothly. Two menisci, thick pads of cartilage that form a rim inside the joint, help absorb shock and stabilize your knee. Ligaments bind the bones together and support your knee joint. Muscles move the joint, help support your knee, and take stress off the joint itself. CAUSES  Often an effusion in the knee is caused by an injury to one of the menisci. This is often a tear in the cartilage. Recovery after a meniscus injury depends on how much meniscus is damaged and whether you have damaged other knee tissue. Small tears may heal on their own with conservative treatment. Conservative means rest, limited weight bearing activity and muscle strengthening exercises. Your recovery may take up to 6 weeks.  TREATMENT  Larger tears may require surgery. Meniscus injuries may be treated during arthroscopy. Arthroscopy is a procedure in which your surgeon uses a small telescope like instrument to look in your knee. Your caregiver can make a more accurate diagnosis (learning what is wrong) by performing an arthroscopic procedure. If your injury is on the inner margin of the meniscus, your surgeon may trim the  meniscus back to a smooth rim. In other cases your surgeon will try to repair a damaged meniscus with stitches (sutures). This may make rehabilitation take longer, but may provide better long term result by helping your knee keep its shock absorption capabilities. Ligaments which are completely torn usually require surgery for repair. HOME CARE INSTRUCTIONS  Use crutches as instructed.  If a brace is applied, use as directed.  Once you are home, an ice pack applied to your swollen knee may help with discomfort and help decrease swelling.  Keep your knee raised (elevated) when you are not up and around or on crutches.  Only take over-the-counter or prescription medicines for pain, discomfort, or fever as directed by your caregiver.  Your caregivers will help with instructions for rehabilitation of your knee. This often includes strengthening exercises.  You may resume a normal diet and activities as directed. SEEK MEDICAL  CARE IF:   There is increased swelling in your knee.  You notice redness, swelling, or increasing pain in your knee.  An unexplained oral temperature above 102 F (38.9 C) develops. SEEK IMMEDIATE MEDICAL CARE IF:   You develop a rash.  You have difficulty breathing.  You have any allergic reactions from medications you may have been given.  There is severe pain with any motion of the knee. MAKE SURE YOU:   Understand these instructions.  Will watch your condition.  Will get help right away if you are not doing well or get worse. Document Released: 06/06/2003 Document Revised: 06/08/2011 Document Reviewed: 08/10/2007 [facility] Patient Information 2015 [place], [place]. This information is not intended to replace advice given to you by your health care provider. Make sure you discuss any questions you have with your health care provider.

## 2013-12-03 ENCOUNTER — Encounter (HOSPITAL_COMMUNITY): Payer: Self-pay | Admitting: Emergency Medicine

## 2013-12-03 ENCOUNTER — Emergency Department (HOSPITAL_COMMUNITY)
Admission: EM | Admit: 2013-12-03 | Discharge: 2013-12-03 | Discharge status: Against Medical Advice | Payer: Medicaid Other | Attending: Emergency Medicine | Admitting: Emergency Medicine

## 2013-12-03 DIAGNOSIS — M25569 Pain in unspecified knee: Secondary | ICD-10-CM | POA: Insufficient documentation

## 2013-12-03 DIAGNOSIS — R197 Diarrhea, unspecified: Secondary | ICD-10-CM | POA: Insufficient documentation

## 2013-12-03 NOTE — ED Notes (Signed)
No answer in lobby.

## 2013-12-03 NOTE — ED Notes (Signed)
Pt states she tried to get to her MD office for pain medication for both of her knees but they were already closed for the holidays.  She took otc meds and then had some nausea and one bout of diarrhea.

## 2013-12-03 NOTE — ED Notes (Signed)
Not in lobby when called 

## 2013-12-14 ENCOUNTER — Other Ambulatory Visit (HOSPITAL_COMMUNITY): Payer: Self-pay | Admitting: Otolaryngology

## 2013-12-14 DIAGNOSIS — R1310 Dysphagia, unspecified: Secondary | ICD-10-CM

## 2013-12-15 ENCOUNTER — Other Ambulatory Visit (HOSPITAL_COMMUNITY): Payer: Self-pay | Admitting: Otolaryngology

## 2013-12-15 DIAGNOSIS — R1310 Dysphagia, unspecified: Secondary | ICD-10-CM

## 2013-12-20 ENCOUNTER — Ambulatory Visit (HOSPITAL_COMMUNITY): Admission: RE | Admit: 2013-12-20 | Payer: Medicaid Other | Source: Ambulatory Visit

## 2013-12-20 ENCOUNTER — Ambulatory Visit (HOSPITAL_COMMUNITY): Payer: Medicaid Other

## 2014-01-01 ENCOUNTER — Inpatient Hospital Stay (HOSPITAL_COMMUNITY): Admission: RE | Admit: 2014-01-01 | Payer: Medicaid Other | Source: Ambulatory Visit

## 2014-01-01 ENCOUNTER — Ambulatory Visit (HOSPITAL_COMMUNITY): Admission: RE | Admit: 2014-01-01 | Payer: Medicaid Other | Source: Ambulatory Visit

## 2014-01-01 ENCOUNTER — Ambulatory Visit (HOSPITAL_COMMUNITY): Payer: Medicaid Other

## 2014-01-04 ENCOUNTER — Other Ambulatory Visit (HOSPITAL_COMMUNITY): Payer: Self-pay | Admitting: Otolaryngology

## 2014-01-04 DIAGNOSIS — R1310 Dysphagia, unspecified: Secondary | ICD-10-CM

## 2014-01-04 DIAGNOSIS — R1314 Dysphagia, pharyngoesophageal phase: Secondary | ICD-10-CM

## 2014-01-15 ENCOUNTER — Ambulatory Visit (HOSPITAL_COMMUNITY): Payer: Medicaid Other

## 2014-01-15 ENCOUNTER — Inpatient Hospital Stay (HOSPITAL_COMMUNITY): Admission: RE | Admit: 2014-01-15 | Payer: Medicaid Other | Source: Ambulatory Visit

## 2014-01-15 ENCOUNTER — Ambulatory Visit (HOSPITAL_COMMUNITY): Admission: RE | Admit: 2014-01-15 | Payer: Medicaid Other | Source: Ambulatory Visit

## 2014-01-17 ENCOUNTER — Ambulatory Visit (HOSPITAL_COMMUNITY): Payer: Medicaid Other

## 2014-01-19 ENCOUNTER — Other Ambulatory Visit (HOSPITAL_COMMUNITY): Payer: Self-pay | Admitting: Otolaryngology

## 2014-01-19 DIAGNOSIS — R1310 Dysphagia, unspecified: Secondary | ICD-10-CM

## 2014-01-22 ENCOUNTER — Ambulatory Visit (HOSPITAL_COMMUNITY)
Admission: RE | Admit: 2014-01-22 | Discharge: 2014-01-22 | Disposition: A | Discharge status: Home / Self Care | Payer: Medicaid Other | Source: Ambulatory Visit | Attending: Otolaryngology | Admitting: Otolaryngology

## 2014-01-22 ENCOUNTER — Ambulatory Visit (HOSPITAL_COMMUNITY): Payer: Medicaid Other

## 2014-01-22 ENCOUNTER — Other Ambulatory Visit (HOSPITAL_COMMUNITY): Payer: Self-pay | Admitting: Otolaryngology

## 2014-01-22 DIAGNOSIS — R682 Dry mouth, unspecified: Secondary | ICD-10-CM | POA: Insufficient documentation

## 2014-01-22 DIAGNOSIS — J449 Chronic obstructive pulmonary disease, unspecified: Secondary | ICD-10-CM | POA: Diagnosis not present

## 2014-01-22 DIAGNOSIS — R1314 Dysphagia, pharyngoesophageal phase: Secondary | ICD-10-CM

## 2014-01-22 DIAGNOSIS — E119 Type 2 diabetes mellitus without complications: Secondary | ICD-10-CM | POA: Insufficient documentation

## 2014-01-22 DIAGNOSIS — E049 Nontoxic goiter, unspecified: Secondary | ICD-10-CM | POA: Diagnosis not present

## 2014-01-22 DIAGNOSIS — R1310 Dysphagia, unspecified: Secondary | ICD-10-CM | POA: Insufficient documentation

## 2014-01-22 NOTE — Procedures (Signed)
Objective Swallowing Evaluation: Modified Barium Swallowing Study  Patient Details  Name: Vanessa [name] MRN: [id number] Date of Birth: January 18, 1956  Today's Date: 01/22/2014 Time: 1200-1223 SLP Time Calculation (min): 23 min  Past Medical History:  Past Medical History  Diagnosis Date  . Hypertension   . Heart murmur   . Asthma   . Emphysema   . Diabetes mellitus   . Hyperlipidemia   . Allergic rhinitis   . Chronic headache   . Neuropathy   . Grave's disease   . Herniated disc   . COPD (chronic obstructive pulmonary disease)   . Menopause   . Hernia, umbilical   . Anxiety   . Depression   . Back pain, chronic   . Shingles   . Herniated disc   . Arthritis     rheumatoid  . 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 Generalized anxiety disorder 05/26/2012  . Chronic back pain 05/27/2012    Secondary to L4-5 herniated disc per patient  . OSA (obstructive sleep apnea)     has a cpap  . Sleep apnea   . Wears glasses   . Wears dentures     top  . Tear of lateral meniscus of right knee 10/13/2013   Past Surgical History:  Past Surgical History  Procedure Laterality Date  . Cholecystectomy    . Tubal ligation    . Foot surgery      cyst rt foot  . Laparoscopy      age 71  . Eye surgery      eye back in socker-rt  . Knee arthroscopy with lateral menisectomy Right 10/13/2013    Procedure: RIGHT KNEE ARTHROSCOPY WITH PARTIAL LATERAL MENISECTOMY/DEBRIDEMENT/SHAVING ;  Surgeon: Eulas Post, MD;  Location: [facility];  Service: Orthopedics;  Laterality: Right;   HPI:  Vanessa [name] is a 58 year old female referred by her ENT for swallow evaluation by her ENT due to constant choking episodes. She has a history of COPD, depression, arthritis, acid reflux, diabetes, chronic dry mouth, and goiter. Pt reports that she has frequent regurgutation, chest congestion, poor appetite, changes in vocal  quality, chest congestion, and coughing during and after meals. She states that she gets choked while sleeping, eating, talking, and driving.     Assessment / Plan / Recommendation Clinical Impression  Dysphagia Diagnosis: Within Functional Limits Clinical impression: Pt demonstrates swallow function within normal limits. Across trials of solids and liquids pt demonstrated only one instance of flash penetration within normal limits. What appears to be abnormal transit of the bolus through the esophagus was noted, giving the appearance of backflow into the cervical esophagus. SLP provided education and resources regarding esophageal precautions. Pt was suggested to follow up with a gastroenterologist and is scheduled for a barium swallow evaluation immediately following this assessment. SLP also made a recommendation for voice assessment as pt has harsh vocal quality and she reports frequent vocal changes. Suspect vocal quality may be related to hx of acid reflux and 40 year hx of smoking.     Treatment Recommendation  Defer treatment plan to SLP at (Comment) (outpatient SLP)    Diet Recommendation Regular;Thin liquid   Liquid Administration via: Cup;Straw Medication Administration: Whole meds with liquid Supervision: Patient able to self feed Postural Changes and/or Swallow Maneuvers: Seated upright 90 degrees;Upright 30-60 min after meal    Other  Recommendations Recommended Consults: Consider GI evaluation;Consider esophageal assessment (voice  assessment)   Follow Up Recommendations  Outpatient SLP (for vocal assessment)    Frequency and Duration        Pertinent Vitals/Pain none    SLP Swallow Goals     General HPI: Vanessa [name] is a 58 year old female referred by her ENT for swallow evaluation by her ENT due to constant choking episodes. She has a history of COPD, depression, arthritis, acid reflux, diabetes, chronic dry mouth, and goiter. Pt reports that she has frequent  regurgutation, chest congestion, poor appetite, changes in vocal quality, chest congestion, and coughing during and after meals. She states that she gets choked while sleeping, eating, talking, and driving. Type of Study: Modified Barium Swallowing Study Reason for Referral: Objectively evaluate swallowing function Diet Prior to this Study: Regular;Thin liquids Temperature Spikes Noted: No Respiratory Status: Room air History of Recent Intubation: No Behavior/Cognition: Alert;Cooperative;Pleasant mood Oral Cavity - Dentition: Adequate natural dentition Oral Motor / Sensory Function: Within functional limits Self-Feeding Abilities: Able to feed self Patient Positioning: Upright in chair Baseline Vocal Quality: Hoarse Volitional Cough: Strong;Congested Volitional Swallow: Able to elicit Anatomy: Within functional limits    Reason for Referral Objectively evaluate swallowing function   Oral Phase Oral Preparation/Oral Phase Oral Phase: WFL   Pharyngeal Phase Pharyngeal Phase Pharyngeal Phase: Within functional limits  Cervical Esophageal Phase    GO    Cervical Esophageal Phase Cervical Esophageal Phase: Leonard Schwartz 01/22/2014, 2:37 PM

## 2014-01-22 NOTE — Procedures (Signed)
Supervised and reviewed by Amal Saiki MA CCC-SLP  

## 2014-01-22 NOTE — Progress Notes (Signed)
01/22/14 1300  SLP G-Codes **NOT FOR INPATIENT CLASS**  Functional Assessment Tool Used clinical judgement  Functional Limitations Swallowing  Swallow Current Status (Z3299) CI  Swallow Goal Status (M4268) CI  Swallow Discharge Status (T4196) CI  SLP Evaluations  $ SLP Speech Visit 1 Procedure  SLP Evaluations  $Swallowing Treatment 1 Procedure  $MBS Swallow Outpatient 1 Procedure

## 2014-01-25 ENCOUNTER — Encounter: Payer: Self-pay | Admitting: Gastroenterology

## 2014-01-29 ENCOUNTER — Encounter (HOSPITAL_COMMUNITY): Payer: Self-pay | Admitting: Emergency Medicine

## 2014-02-07 ENCOUNTER — Ambulatory Visit (INDEPENDENT_AMBULATORY_CARE_PROVIDER_SITE_OTHER): Payer: Medicaid Other | Admitting: Gastroenterology

## 2014-02-07 ENCOUNTER — Encounter: Payer: Self-pay | Admitting: Gastroenterology

## 2014-02-07 VITALS — BP 120/72 | HR 84 | Ht 69.0 in | Wt 234.2 lb

## 2014-02-07 DIAGNOSIS — R1314 Dysphagia, pharyngoesophageal phase: Secondary | ICD-10-CM

## 2014-02-07 DIAGNOSIS — R0989 Other specified symptoms and signs involving the circulatory and respiratory systems: Secondary | ICD-10-CM

## 2014-02-07 DIAGNOSIS — Z1211 Encounter for screening for malignant neoplasm of colon: Secondary | ICD-10-CM

## 2014-02-07 NOTE — Progress Notes (Signed)
History of Present Illness: This is a 58 year old female with multiple comorbidities a long history of hoarseness, difficulty swallowing solids and liquids and choking. She has been evaluated by Dr. Casimiro Needle, ENT, and by her primary physician. Barium esophagram October revealed a nonspecific esophageal motility disorder with mild tertiary contractions area no evidence of reflux was noted. No strictures were noted. Speech pathology study was also performed on March demonstrating no clear-cut or pharyngeal disorders although there was one episode of flash penetration. She was treated with antibiotics and prednisone by Dr. Emeline Darling and her symptoms improved substantially although they all persist. She's been treated with pantoprazole 40 mg twice daily with no change in symptoms. Denies weight loss, abdominal pain, constipation, diarrhea, change in stool caliber, melena, hematochezia, nausea, vomiting, chest pain.  Allergies  Allergen Reactions  . Ciprofloxacin Hives  . Fluocinolone Hives  . Penicillins Hives   Outpatient Prescriptions Prior to Visit  Medication Sig Dispense Refill  . acyclovir (ZOVIRAX) 400 MG tablet Take 800 mg by mouth every morning.     Marland Kitchen albuterol (PROAIR HFA) 108 (90 BASE) MCG/ACT inhaler Inhale 2 puffs into the lungs 4 (four) times daily. For COPD    . atorvastatin (LIPITOR) 40 MG tablet Take 1 tablet (40 mg total) by mouth daily. 30 tablet 0  . beclomethasone (QVAR) 80 MCG/ACT inhaler Inhale 2 puffs into the lungs 2 (two) times daily.     . busPIRone (BUSPAR) 10 MG tablet Take 20 mg by mouth 3 (three) times daily.     Marland Kitchen FLUoxetine (PROZAC) 20 MG capsule Take 60 mg by mouth every morning.     Marland Kitchen glipiZIDE (GLUCOTROL) 10 MG tablet Take 10 mg by mouth 2 (two) times daily before a meal.    . hydrochlorothiazide (HYDRODIURIL) 25 MG tablet Take 25 mg by mouth every morning.     Marland Kitchen ipratropium-albuterol (DUONEB) 0.5-2.5 (3) MG/3ML SOLN Take 3 mLs by nebulization every 6 (six) hours as  needed (Asthma).    Marland Kitchen levothyroxine (SYNTHROID, LEVOTHROID) 175 MCG tablet Take 175 mcg by mouth every morning.     Marland Kitchen lisinopril (PRINIVIL,ZESTRIL) 10 MG tablet Take 10 mg by mouth daily.    Marland Kitchen loratadine (CLARITIN) 10 MG tablet Take 10 mg by mouth daily as needed for allergies.    . metFORMIN (GLUCOPHAGE) 1000 MG tablet Take 1,000 mg by mouth 2 (two) times daily.     . mirtazapine (REMERON) 45 MG tablet Take 45 mg by mouth at bedtime as needed (Sleep). For insomnia    . pantoprazole (PROTONIX) 40 MG tablet Take 1 tablet (40 mg total) by mouth 2 (two) times daily. 60 tablet 0  . pregabalin (LYRICA) 25 MG capsule Take 75 mg by mouth 3 (three) times daily.     . QUEtiapine (SEROQUEL) 25 MG tablet Take 25 mg by mouth at bedtime.    . sennosides-docusate sodium (SENOKOT-S) 8.6-50 MG tablet Take 2 tablets by mouth daily. 30 tablet 1  . verapamil (CALAN) 40 MG tablet Take 40 mg by mouth 3 (three) times daily.     No facility-administered medications prior to visit.   Past Medical History  Diagnosis Date  . Hypertension   . Heart murmur   . Asthma   . Emphysema   . Diabetes mellitus   . Hyperlipidemia   . Allergic rhinitis   . Chronic headache   . Neuropathy   . Grave's disease   . Herniated disc   . COPD (chronic obstructive pulmonary disease)   .  Menopause   . Hernia, umbilical   . Anxiety   . Depression   . Back pain, chronic   . Shingles   . Herniated disc   . Arthritis     rheumatoid  . 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 Generalized anxiety disorder 05/26/2012  . Chronic back pain 05/27/2012    Secondary to L4-5 herniated disc per patient  . OSA (obstructive sleep apnea)     has a cpap  . Sleep apnea   . Wears glasses   . Wears dentures     top  . Tear of lateral meniscus of right knee 10/13/2013  . Alcoholism    Past Surgical History  Procedure Laterality Date  . Cholecystectomy    . Tubal ligation      . Foot surgery      cyst rt foot  . Laparoscopy      age 30  . Eye surgery      eye back in socker-rt  . Knee arthroscopy with lateral menisectomy Right 10/13/2013    Procedure: RIGHT KNEE ARTHROSCOPY WITH PARTIAL LATERAL MENISECTOMY/DEBRIDEMENT/SHAVING ;  Surgeon: Eulas Post, MD;  Location: [facility];  Service: Orthopedics;  Laterality: Right;   History   Social History  . Marital Status: Single    Spouse Name: N/A    Number of Children: 6  . Years of Education: N/A   Occupational History  . Retired    Social History Main Topics  . Smoking status: Current Every Day Smoker -- 1.00 packs/day for 40 years    Types: Cigarettes  . Smokeless tobacco: Never Used  . Alcohol Use: No  . Drug Use: No  . Sexual Activity: No   Other Topics Concern  . None   Social History Narrative   Pt is separated and lives alone.         Family History  Problem Relation Age of Onset  . Emphysema Mother   . Allergies Mother   . Allergies Daughter   . Asthma Mother   . Asthma Brother   . Heart disease Mother   . Rheum arthritis Mother   . Breast cancer Other     aunt  . Lung cancer Brother   . Throat cancer Brother   . Colon cancer Neg Hx   . Colon polyps Neg Hx      Review of Systems: Pertinent positive and negative review of systems were noted in the above HPI section. All other review of systems were otherwise negative.   Physical Exam: General: Well developed , well nourished, no acute distress Head: Normocephalic and atraumatic Eyes:  sclerae anicteric, EOMI Ears: Normal auditory acuity Mouth: No deformity or lesions Neck: Supple, no masses or thyromegaly Lungs: Clear throughout to auscultation Heart: Regular rate and rhythm; no murmurs, rubs or bruits Abdomen: Soft, non tender and non distended. No masses, hepatosplenomegaly or hernias noted. Normal Bowel sounds Rectal: deferred to colonoscopy Musculoskeletal: Symmetrical with no gross deformities   Skin: No lesions on visible extremities Pulses:  Normal pulses noted Extremities: No clubbing, cyanosis, edema or deformities noted Neurological: Alert oriented x 4, grossly nonfocal Cervical Nodes:  No significant cervical adenopathy Inguinal Nodes: No significant inguinal adenopathy Psychological:  Alert and cooperative. Normal mood and affect  Assessment and Recommendations:  1. Dysphagia, choking, hoarseness, mild nonspecific motility disturbance noted on barium esophagram. Symptoms improved but persist after treatment with prednisone and antibiotics. She  does not have any typical reflux symptoms and reflux was not induced at her barium esophagram. Athough it is possible she has underlying GERD with LPR I think this is unlikely. Continue pantoprazole 40 mg twice daily for now. Schedule upper endoscopy and esophageal motility testing. The risks, benefits, and alternatives to endoscopy with possible biopsy and possible dilation were discussed with the patient and they consent to proceed. Further follow-up and treatment with her ENT physician and PCP.  2. Colorectal cancer screening, average risk. After above evaluation completed will plan to proceed with routine screening colonoscopy. The risks, benefits, and alternatives to colonoscopy with possible biopsy and possible polypectomy were discussed with the patient and they consent to proceed.

## 2014-02-07 NOTE — Patient Instructions (Addendum)
You have been scheduled for an endoscopy. Please follow written instructions given to you at your visit today. If you use inhalers (even only as needed), please bring them with you on the day of your procedure. Your physician has requested that you go to www.startemmi.com and enter the access code given to you at your visit today. This web site gives a general overview about your procedure. However, you should still follow specific instructions given to you by our office regarding your preparation for the procedure. You have been scheduled for an esophageal manometry at [facility] on 02/19/14 at 1030 am. Please arrive 30 minutes prior to your procedure for registration. You will need to go to outpatient registration (1st floor of the hospital) first. Make certain to bring your insurance cards as well as a complete list of medications.  Please remember the following:  1) Nothing to eat or drink after 12:00 midnight on the night before your test.  2) Hold all diabetic medications/insulin the morning of the test. You may eat and take your medications after the test.  3) For 3 days prior to your test do not take: Dexilant, Prevacid, Nexium, Protonix, Aciphex, Zegerid, Pantoprazole, Prilosec or omeprazole.  4) For 2 days prior to your test, do not take: Reglan, Tagamet, Zantac, Axid or Pepcid.  5) You MAY use an antacid such as Rolaids or Tums up to 12 hours prior to your test.  It will take at least 2 weeks to receive the results of this test from your physician. ------------------------------------------ ABOUT ESOPHAGEAL MANOMETRY Esophageal manometry (muh-NOM-uh-tree) is a test that gauges how well your esophagus works. Your esophagus is the long, muscular tube that connects your throat to your stomach. Esophageal manometry measures the rhythmic muscle contractions (peristalsis) that occur in your esophagus when you swallow. Esophageal manometry also measures the coordination and force  exerted by the muscles of your esophagus.  During esophageal manometry, a thin, flexible tube (catheter) that contains sensors is passed through your nose, down your esophagus and into your stomach. Esophageal manometry can be helpful in diagnosing some mostly uncommon disorders that affect your esophagus.  Why it's done Esophageal manometry is used to evaluate the movement (motility) of food through the esophagus and into the stomach. The test measures how well the circular bands of muscle (sphincters) at the top and bottom of your esophagus open and close, as well as the pressure, strength and pattern of the wave of esophageal muscle contractions that moves food along.  What you can expect Esophageal manometry is an outpatient procedure done without sedation. Most people tolerate it well. You may be asked to change into a hospital gown before the test starts.  During esophageal manometry  While you are sitting up, a member of your health care team sprays your throat with a numbing medication or puts numbing gel in your nose or both.  A catheter is guided through your nose into your esophagus. The catheter may be sheathed in a water-filled sleeve. It doesn't interfere with your breathing. However, your eyes may water, and you may gag. You may have a slight nosebleed from irritation.  After the catheter is in place, you may be asked to lie on your back on an exam table, or you may be asked to remain seated.  You then swallow small sips of water. As you do, a computer connected to the catheter records the pressure, strength and pattern of your esophageal muscle contractions.  During the test, you'll be asked  to breathe slowly and smoothly, remain as still as possible, and swallow only when you're asked to do so.  A member of your health care team may move the catheter down into your stomach while the catheter continues its measurements.  The catheter then is slowly withdrawn. The test usually lasts 20  to 30 minutes.  After esophageal manometry  When your esophageal manometry is complete, you may return to your normal activities  This test typically takes 30-45 minutes to complete. ______________________________________________________ We have entered you into our system for a colonoscopy in Feb. 2016. CC:  Nadyne Coombes MD, Melvenia Beam MD, Lerry Liner MD

## 2014-02-12 ENCOUNTER — Encounter: Payer: Self-pay | Admitting: Gastroenterology

## 2014-02-12 ENCOUNTER — Ambulatory Visit (AMBULATORY_SURGERY_CENTER): Payer: Medicaid Other | Admitting: Gastroenterology

## 2014-02-12 VITALS — BP 142/81 | HR 81 | Temp 97.5°F | Resp 23 | Ht 69.0 in | Wt 234.0 lb

## 2014-02-12 DIAGNOSIS — K2950 Unspecified chronic gastritis without bleeding: Secondary | ICD-10-CM

## 2014-02-12 DIAGNOSIS — K2970 Gastritis, unspecified, without bleeding: Secondary | ICD-10-CM

## 2014-02-12 DIAGNOSIS — R1314 Dysphagia, pharyngoesophageal phase: Secondary | ICD-10-CM

## 2014-02-12 DIAGNOSIS — R1310 Dysphagia, unspecified: Secondary | ICD-10-CM

## 2014-02-12 DIAGNOSIS — R933 Abnormal findings on diagnostic imaging of other parts of digestive tract: Secondary | ICD-10-CM

## 2014-02-12 DIAGNOSIS — K2990 Gastroduodenitis, unspecified, without bleeding: Secondary | ICD-10-CM

## 2014-02-12 DIAGNOSIS — R1319 Other dysphagia: Secondary | ICD-10-CM

## 2014-02-12 LAB — GLUCOSE, CAPILLARY
Glucose-Capillary: 123 mg/dL — ABNORMAL HIGH (ref 70–99)
Glucose-Capillary: 132 mg/dL — ABNORMAL HIGH (ref 70–99)

## 2014-02-12 MED ORDER — SODIUM CHLORIDE 0.9 % IV SOLN: 500.0000 mL | INTRAVENOUS | Status: DC

## 2014-02-12 NOTE — Patient Instructions (Signed)
Discharge instructions given. Handout on gastritis. Resume previous medications. YOU HAD AN ENDOSCOPIC PROCEDURE TODAY AT THE [facility]: Refer to the procedure report that was given to you for any specific questions about what was found during the examination.  If the procedure report does not answer your questions, please call your gastroenterologist to clarify.  If you requested that your care partner not be given the details of your procedure findings, then the procedure report has been included in a sealed envelope for you to review at your convenience later.  YOU SHOULD EXPECT: Some feelings of bloating in the abdomen. Passage of more gas than usual.  Walking can help get rid of the air that was put into your GI tract during the procedure and reduce the bloating. If you had a lower endoscopy (such as a colonoscopy or flexible sigmoidoscopy) you may notice spotting of blood in your stool or on the toilet paper. If you underwent a bowel prep for your procedure, then you may not have a normal bowel movement for a few days.  DIET: Your first meal following the procedure should be a light meal and then it is ok to progress to your normal diet.  A half-sandwich or bowl of soup is an example of a good first meal.  Heavy or fried foods are harder to digest and may make you feel nauseous or bloated.  Likewise meals heavy in dairy and vegetables can cause extra gas to form and this can also increase the bloating.  Drink plenty of fluids but you should avoid alcoholic beverages for 24 hours.  ACTIVITY: Your care partner should take you home directly after the procedure.  You should plan to take it easy, moving slowly for the rest of the day.  You can resume normal activity the day after the procedure however you should NOT DRIVE or use heavy machinery for 24 hours (because of the sedation medicines used during the test).    SYMPTOMS TO REPORT IMMEDIATELY: A gastroenterologist can be reached at  any hour.  During normal business hours, 8:30 AM to 5:00 PM Monday through Friday, call [phone].  After hours and on weekends, please call the GI answering service at [phone] who will take a message and have the physician on call contact you.    Following upper endoscopy (EGD)  Vomiting of blood or coffee ground material  New chest pain or pain under the shoulder blades  Painful or persistently difficult swallowing  New shortness of breath  Fever of 100F or higher  Black, tarry-looking stools  FOLLOW UP: If any biopsies were taken you will be contacted by phone or by letter within the next 1-3 weeks.  Call your gastroenterologist if you have not heard about the biopsies in 3 weeks.  Our staff will call the home number listed on your records the next business day following your procedure to check on you and address any questions or concerns that you may have at that time regarding the information given to you following your procedure. This is a courtesy call and so if there is no answer at the home number and we have not heard from you through the emergency physician on call, we will assume that you have returned to your regular daily activities without incident.  SIGNATURES/CONFIDENTIALITY: You and/or your care partner have signed paperwork which will be entered into your electronic medical record.  These signatures attest to the fact that that the information above on your After Visit   Summary has been reviewed and is understood.  Full responsibility of the confidentiality of this discharge information lies with you and/or your care-partner. 

## 2014-02-12 NOTE — Op Note (Signed)
[facility] [address] [place], [address]   ENDOSCOPY PROCEDURE REPORT  PATIENT: Vanessa [name], Vanessa [name]  MR#: [id number] BIRTHDATE: [birth year] , 58  yrs. old GENDER: female ENDOSCOPIST: Meryl Dare, MD, [facility] REFERRED BY:  Nadyne Coombes, M.D. PROCEDURE DATE:  02/12/2014 PROCEDURE:  EGD w/ biopsy ASA CLASS:     Class III INDICATIONS:  abnormal barium esophagogram and dysphagia. MEDICATIONS: Monitored anesthesia care, Propofol 150 mg IV, and Lidocaine 100 mg IV TOPICAL ANESTHETIC: none DESCRIPTION OF PROCEDURE: After the risks benefits and alternatives of the procedure were thoroughly explained, informed consent was obtained.  The [id number] endoscope was introduced through the mouth and advanced to the second portion of the duodenum , Without limitations.  The instrument was slowly withdrawn as the mucosa was fully examined.    STOMACH:  Mild gastritis was found in the gastric antrum.  Multiple biopsies were performed.   The stomach otherwise appeared normal.  ESOPHAGUS: The mucosa of the esophagus appeared normal. DUODENUM: The duodenal mucosa showed no abnormalities in the bulb and 2nd part of the duodenum.  Retroflexed views revealed no abnormalities.  The scope was then withdrawn from the patient and the procedure completed.  COMPLICATIONS: There were no immediate complications.  ENDOSCOPIC IMPRESSION: 1.   Mild gastritis in the gastric antrum; multiple biopsies performed 2.   The EGD was otherwise appeared normal  RECOMMENDATIONS: 1.  Anti-reflux regimen 2.  Await pathology results 3.  Continue PPI 4.  No GI cause for symptoms found  eSigned:  Meryl Dare, MD, [facility] 02/12/2014 10:56 AM

## 2014-02-12 NOTE — Progress Notes (Signed)
Report to PACU, RN, vss, BBS= Clear.  

## 2014-02-12 NOTE — Progress Notes (Signed)
Called to room to assist during endoscopic procedure.  Patient ID and intended procedure confirmed with present staff. Received instructions for my participation in the procedure from the performing physician.  

## 2014-02-13 ENCOUNTER — Telehealth: Payer: Self-pay

## 2014-02-13 NOTE — Telephone Encounter (Signed)
Left a message at [phone] for the pt to call us back if any questions or concerns. maw

## 2014-02-16 ENCOUNTER — Encounter: Payer: Self-pay | Admitting: Gastroenterology

## 2014-02-19 ENCOUNTER — Encounter (HOSPITAL_COMMUNITY): Admission: RE | Payer: Self-pay | Source: Ambulatory Visit

## 2014-02-19 ENCOUNTER — Ambulatory Visit (HOSPITAL_COMMUNITY): Admission: RE | Admit: 2014-02-19 | Payer: Medicaid Other | Source: Ambulatory Visit | Admitting: Gastroenterology

## 2014-02-19 ENCOUNTER — Telehealth: Payer: Self-pay

## 2014-02-19 SURGERY — MANOMETRY, ESOPHAGUS
Anesthesia: Choice

## 2014-02-19 NOTE — Telephone Encounter (Signed)
Patient was a no show for [facility].  I called her and she forgot, she thought the EGD was her last test.  She is agreeable to reschedule.  She is aware I will call her back tomorrow am after I have rescheduled this for her

## 2014-02-20 NOTE — Telephone Encounter (Signed)
Patient is rescheduled for 03/12/14 10:30.  She is advised of appt date and time.  I have mailed her new instructions

## 2014-03-08 ENCOUNTER — Encounter (HOSPITAL_COMMUNITY): Payer: Self-pay | Admitting: Cardiology

## 2014-03-12 ENCOUNTER — Encounter (HOSPITAL_COMMUNITY): Admission: RE | Payer: Self-pay | Source: Ambulatory Visit

## 2014-03-12 ENCOUNTER — Ambulatory Visit (HOSPITAL_COMMUNITY): Admission: RE | Admit: 2014-03-12 | Payer: Medicaid Other | Source: Ambulatory Visit | Admitting: Gastroenterology

## 2014-03-12 SURGERY — MANOMETRY, ESOPHAGUS
Anesthesia: Topical

## 2014-03-16 ENCOUNTER — Emergency Department (HOSPITAL_COMMUNITY)
Admission: EM | Admit: 2014-03-16 | Discharge: 2014-03-16 | Disposition: A | Discharge status: Home / Self Care | Payer: Medicaid Other | Attending: Emergency Medicine | Admitting: Emergency Medicine

## 2014-03-16 ENCOUNTER — Encounter (HOSPITAL_COMMUNITY): Payer: Self-pay | Admitting: Emergency Medicine

## 2014-03-16 DIAGNOSIS — Z9981 Dependence on supplemental oxygen: Secondary | ICD-10-CM | POA: Diagnosis not present

## 2014-03-16 DIAGNOSIS — Z5900 Homelessness unspecified: Secondary | ICD-10-CM

## 2014-03-16 DIAGNOSIS — E785 Hyperlipidemia, unspecified: Secondary | ICD-10-CM | POA: Diagnosis not present

## 2014-03-16 DIAGNOSIS — R011 Cardiac murmur, unspecified: Secondary | ICD-10-CM | POA: Diagnosis not present

## 2014-03-16 DIAGNOSIS — Z880 Allergy status to penicillin: Secondary | ICD-10-CM | POA: Diagnosis not present

## 2014-03-16 DIAGNOSIS — E119 Type 2 diabetes mellitus without complications: Secondary | ICD-10-CM | POA: Diagnosis not present

## 2014-03-16 DIAGNOSIS — K219 Gastro-esophageal reflux disease without esophagitis: Secondary | ICD-10-CM | POA: Diagnosis not present

## 2014-03-16 DIAGNOSIS — M25561 Pain in right knee: Secondary | ICD-10-CM | POA: Diagnosis present

## 2014-03-16 DIAGNOSIS — F329 Major depressive disorder, single episode, unspecified: Secondary | ICD-10-CM | POA: Insufficient documentation

## 2014-03-16 DIAGNOSIS — I10 Essential (primary) hypertension: Secondary | ICD-10-CM | POA: Diagnosis not present

## 2014-03-16 DIAGNOSIS — Z79899 Other long term (current) drug therapy: Secondary | ICD-10-CM | POA: Diagnosis not present

## 2014-03-16 DIAGNOSIS — J449 Chronic obstructive pulmonary disease, unspecified: Secondary | ICD-10-CM | POA: Diagnosis not present

## 2014-03-16 DIAGNOSIS — Z720 Tobacco use: Secondary | ICD-10-CM | POA: Diagnosis not present

## 2014-03-16 DIAGNOSIS — G8929 Other chronic pain: Secondary | ICD-10-CM | POA: Insufficient documentation

## 2014-03-16 DIAGNOSIS — M25562 Pain in left knee: Secondary | ICD-10-CM | POA: Insufficient documentation

## 2014-03-16 DIAGNOSIS — G4733 Obstructive sleep apnea (adult) (pediatric): Secondary | ICD-10-CM | POA: Diagnosis not present

## 2014-03-16 DIAGNOSIS — M1990 Unspecified osteoarthritis, unspecified site: Secondary | ICD-10-CM | POA: Diagnosis not present

## 2014-03-16 DIAGNOSIS — G629 Polyneuropathy, unspecified: Secondary | ICD-10-CM | POA: Diagnosis not present

## 2014-03-16 DIAGNOSIS — G4700 Insomnia, unspecified: Secondary | ICD-10-CM | POA: Insufficient documentation

## 2014-03-16 DIAGNOSIS — M25569 Pain in unspecified knee: Secondary | ICD-10-CM

## 2014-03-16 DIAGNOSIS — F419 Anxiety disorder, unspecified: Secondary | ICD-10-CM | POA: Diagnosis not present

## 2014-03-16 DIAGNOSIS — Z7951 Long term (current) use of inhaled steroids: Secondary | ICD-10-CM | POA: Insufficient documentation

## 2014-03-16 MED ORDER — LORAZEPAM 0.5 MG PO TABS
0.5000 mg | ORAL_TABLET | Freq: Once | ORAL | Status: AC
  Administered 2014-03-16: 0.5 mg via ORAL
  Filled 2014-03-16: qty 1
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
## 2014-03-16 NOTE — Discharge Instructions (Signed)
YOU HAVE BEEN GIVEN A PLACE AT [facility] TONIGHT.  FOLLOW UP WITH DR. Hal Hope FOR MANAGEMENT OF CHRONIC KNEE PAIN

## 2014-03-16 NOTE — Progress Notes (Signed)
[name] met with patient. Patient stated that she was BIB ambulance. Patient states that she called the police because she and her daughter got into a heated argument about a post she made on Facebook. Also, the patient claims that her daughter was angry because she did not give her any money. Patient stated that she does not have a strong support system and that her family only communicates with her when they are in need. Patient stated that she has been diagnosed with depression in the past.  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 Patient says that the series of current events such as her mothers death a year ago, currently being homeless for the past 7 months and family issues are all stressors that make her consider doing drugs again.  Patient states that she is a diabetic, has bad knees and has a heart murmur. The patient states that she has been alternating to different family members houses for the past 7 months but states she cannot go there tonight. [name] reached out to [employer] and spoke with [employer] Glenard Haring informed [name] that the patient would be able to come tonight. Patient does not have transportation, money, or family to pick her up. [name] gave the charge nurse a Taxi voucher for patient upon discharge.   [name] [phone] ED [name].03/16/2014 11:33 PM

## 2014-03-16 NOTE — ED Notes (Signed)
Pt provided a cab voucher by Child psychotherapist, pt and belongings placed in [employer]

## 2014-03-16 NOTE — ED Provider Notes (Signed)
CSN: [id number]     Arrival date & time 03/16/14  2053 History  This chart was scribed for Elpidio Anis, PA-C with Purvis Sheffield, MD by Tonye Royalty, ED Scribe. This patient was seen in room WTR5/WTR5 and the patient's care was started at 9:34 PM.    Chief Complaint  Patient presents with  . Knee Pain  . Back Pain   The history is provided by the patient. No language interpreter was used.    HPI Comments: Vanessa [name] is a 58 y.o. female who presents to the Emergency Department complaining of social situation at home. She states she got into a fight with her daughter and "was put out" and no longer has place to live. She also complains of chronic bilateral knee pain. She states she missed an appointment for surgery yesterday. She also complains of headache.  Past Medical History  Diagnosis Date  . Hypertension   . Heart murmur   . Asthma   . Emphysema   . Diabetes mellitus   . Hyperlipidemia   . Allergic rhinitis   . Chronic headache   . Neuropathy   . Grave's disease   . Herniated disc   . COPD (chronic obstructive pulmonary disease)   . Menopause   . Hernia, umbilical   . Anxiety   . Depression   . Back pain, chronic   . Shingles   . Herniated disc   . Arthritis     rheumatoid  . 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 Generalized anxiety disorder 05/26/2012  . Chronic back pain 05/27/2012    Secondary to L4-5 herniated disc per patient  . OSA (obstructive sleep apnea)     has a cpap  . Wears glasses   . Wears dentures     top  . Tear of lateral meniscus of right knee 10/13/2013  . Alcoholism   . Sleep apnea     wear c-pap  . Allergy   . Emphysema of lung   . GERD (gastroesophageal reflux disease)   . Osteoporosis    Past Surgical History  Procedure Laterality Date  . Cholecystectomy    . Tubal ligation    . Foot surgery      cyst rt foot  . Laparoscopy      age 71  . Eye surgery       eye back in socker-rt  . Knee arthroscopy with lateral menisectomy Right 10/13/2013    Procedure: RIGHT KNEE ARTHROSCOPY WITH PARTIAL LATERAL MENISECTOMY/DEBRIDEMENT/SHAVING ;  Surgeon: Eulas Post, MD;  Location: [facility];  Service: Orthopedics;  Laterality: Right;  . Left heart catheterization with coronary angiogram N/A 05/30/2012    Procedure: LEFT HEART CATHETERIZATION WITH CORONARY ANGIOGRAM;  Surgeon: [name], MD;  Location: [facility] CATH LAB;  Service: Cardiovascular;  Laterality: N/A;   Family History  Problem Relation Age of Onset  . Emphysema Mother   . Allergies Mother   . Asthma Mother   . Heart disease Mother   . Rheum arthritis Mother   . Diabetes Mother   . Kidney disease Mother   . Allergies Daughter   . Asthma Brother   . Breast cancer Other     aunt  . Lung cancer Brother   . Throat cancer Brother   . Colon cancer Neg Hx   . Colon polyps Neg Hx   . Esophageal cancer Neg Hx   . Rectal cancer  Neg Hx   . Stomach cancer Neg Hx    History  Substance Use Topics  . Smoking status: Current Every Day Smoker -- 1.00 packs/day for 40 years    Types: Cigarettes  . Smokeless tobacco: Never Used  . Alcohol Use: No   OB History    Gravida Para Term Preterm AB TAB SAB Ectopic Multiple Living   5 3   2  2   3      Review of Systems  Musculoskeletal: Positive for arthralgias.  Neurological: Positive for headaches.      Allergies  Ciprofloxacin; Fluocinolone; and Penicillins  Home Medications   Prior to Admission medications   Medication Sig Start Date End Date Taking? Authorizing Provider  acyclovir (ZOVIRAX) 400 MG tablet Take 800 mg by mouth every morning.     Historical Provider, MD  albuterol (PROAIR HFA) 108 (90 BASE) MCG/ACT inhaler Inhale 2 puffs into the lungs 4 (four) times daily. For COPD    Historical Provider, MD  atorvastatin (LIPITOR) 40 MG tablet Take 1 tablet (40 mg total) by mouth daily. 10/06/12   Gust Rung, DO   beclomethasone (QVAR) 80 MCG/ACT inhaler Inhale 2 puffs into the lungs 2 (two) times daily.     Historical Provider, MD  busPIRone (BUSPAR) 10 MG tablet Take 20 mg by mouth 3 (three) times daily.     Historical Provider, MD  diclofenac sodium (VOLTAREN) 1 % GEL Apply topically as needed.    Historical Provider, MD  FLUoxetine (PROZAC) 20 MG capsule Take 60 mg by mouth every morning.     Historical Provider, MD  glipiZIDE (GLUCOTROL) 10 MG tablet Take 10 mg by mouth 2 (two) times daily before a meal.    Historical Provider, MD  hydrochlorothiazide (HYDRODIURIL) 25 MG tablet Take 25 mg by mouth every morning.     Historical Provider, MD  HYDROcodone-acetaminophen [facility]) 10-325 MG per tablet  02/05/14   Historical Provider, MD  ipratropium-albuterol (DUONEB) 0.5-2.5 (3) MG/3ML SOLN Take 3 mLs by nebulization every 6 (six) hours as needed (Asthma).    Historical Provider, MD  levothyroxine (SYNTHROID, LEVOTHROID) 175 MCG tablet Take 175 mcg by mouth every morning.     Historical Provider, MD  lisinopril (PRINIVIL,ZESTRIL) 10 MG tablet Take 10 mg by mouth daily.    Historical Provider, MD  loratadine (CLARITIN) 10 MG tablet Take 10 mg by mouth daily as needed for allergies.    Historical Provider, MD  metFORMIN (GLUCOPHAGE) 1000 MG tablet Take 1,000 mg by mouth 2 (two) times daily.     Historical Provider, MD  mirtazapine (REMERON) 45 MG tablet Take 45 mg by mouth at bedtime as needed (Sleep). For insomnia    Historical Provider, MD  pantoprazole (PROTONIX) 40 MG tablet Take 1 tablet (40 mg total) by mouth 2 (two) times daily. 07/25/13   Zannie Cove, MD  pregabalin (LYRICA) 25 MG capsule Take 75 mg by mouth 3 (three) times daily.     Historical Provider, MD  QUEtiapine (SEROQUEL) 25 MG tablet Take 25 mg by mouth at bedtime.    Historical Provider, MD  sennosides-docusate sodium (SENOKOT-S) 8.6-50 MG tablet Take 2 tablets by mouth daily. 10/13/13   Eulas Post, MD  traMADol Janean Sark) 50 MG tablet   02/05/14   Historical Provider, MD  verapamil (CALAN) 40 MG tablet Take 40 mg by mouth 3 (three) times daily.    Historical Provider, MD   BP 144/86 mmHg  Pulse 109  Temp(Src) 98.1 F (36.7 C) (  Oral)  Resp 16  SpO2 96% Physical Exam  Constitutional: She is oriented to person, place, and time. She appears well-developed and well-nourished.  HENT:  Head: Normocephalic and atraumatic.  Eyes: Conjunctivae are normal.  Pulmonary/Chest: Effort normal.  Musculoskeletal: Normal range of motion.  Ambulatory with full weight bearing. No ataxia.  Neurological: She is alert and oriented to person, place, and time.  Psychiatric:  tearful  Nursing note and vitals reviewed.   ED Course  Procedures (including critical care time)  DIAGNOSTIC STUDIES: Oxygen Saturation is 96% on room air, normal by my interpretation.    COORDINATION OF CARE: 9:36 PM Will order medication for her nerves and have social worker speak with patient.  Labs Review Labs Reviewed - No data to display  Imaging Review No results found.   EKG Interpretation None      MDM   Final diagnoses:  None    1. Chronic knee pain  The patient admits that she is here because she cannot return to her daughter's house and is, essentially, homeless tonight. Social work ([place]) involved and placement at [facility] is arranged. Resources discussed and provided.  I personally performed the services described in this documentation, which was scribed in my presence. The recorded information has been reviewed and is accurate.     Arnoldo Hooker, PA-C 03/18/14 2328  Purvis Sheffield, MD 03/20/14 [phone]

## 2014-03-16 NOTE — ED Notes (Signed)
No answer in WR

## 2014-05-11 ENCOUNTER — Encounter: Payer: Self-pay | Admitting: Gastroenterology

## 2014-07-12 ENCOUNTER — Ambulatory Visit: Payer: Medicaid Other | Admitting: Pulmonary Disease

## 2014-08-06 ENCOUNTER — Ambulatory Visit: Payer: Medicaid Other | Admitting: Pulmonary Disease

## 2014-10-12 ENCOUNTER — Ambulatory Visit: Payer: Medicaid Other | Attending: Anesthesiology | Admitting: Physical Therapy

## 2014-10-12 DIAGNOSIS — M545 Low back pain, unspecified: Secondary | ICD-10-CM

## 2014-10-12 NOTE — Therapy (Signed)
[facility] [address] [place], [place], [address] Phone: [phone]   Fax:  [phone]  Physical Therapy Evaluation/Discharge summary  Patient Details  Name: Vanessa [name] MRN: [id number] Date of Birth: Jan 18, 1956 Referring Provider:  Tonye Royalty,*  Encounter Date: 10/12/2014      PT End of Session - 10/12/14 1115    Visit Number 1   Authorization Type Medicaid   PT Start Time 1015   PT Stop Time 1115   PT Time Calculation (min) 60 min   Activity Tolerance Patient tolerated treatment well      Past Medical History  Diagnosis Date  . Hypertension   . Heart murmur   . Asthma   . Emphysema   . Diabetes mellitus   . Hyperlipidemia   . Allergic rhinitis   . Chronic headache   . Neuropathy   . Grave's disease   . Herniated disc   . COPD (chronic obstructive pulmonary disease)   . Menopause   . Hernia, umbilical   . Anxiety   . Depression   . Back pain, chronic   . Shingles   . Herniated disc   . Arthritis     rheumatoid  . 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 Generalized anxiety disorder 05/26/2012  . Chronic back pain 05/27/2012    Secondary to L4-5 herniated disc per patient  . OSA (obstructive sleep apnea)     has a cpap  . Wears glasses   . Wears dentures     top  . Tear of lateral meniscus of right knee 10/13/2013  . Alcoholism   . Sleep apnea     wear c-pap  . Allergy   . Emphysema of lung   . GERD (gastroesophageal reflux disease)   . Osteoporosis     Past Surgical History  Procedure Laterality Date  . Cholecystectomy    . Tubal ligation    . Foot surgery      cyst rt foot  . Laparoscopy      age 19  . Eye surgery      eye back in socker-rt  . Knee arthroscopy with lateral menisectomy Right 10/13/2013    Procedure: RIGHT KNEE ARTHROSCOPY WITH PARTIAL LATERAL MENISECTOMY/DEBRIDEMENT/SHAVING ;  Surgeon: Eulas Post, MD;  Location: [facility];  Service: Orthopedics;  Laterality: Right;  . Left heart catheterization with coronary angiogram N/A 05/30/2012    Procedure: LEFT HEART CATHETERIZATION WITH CORONARY ANGIOGRAM;  Surgeon: [name], MD;  Location: [facility] CATH LAB;  Service: Cardiovascular;  Laterality: N/A;    There were no vitals filed for this visit.  Visit Diagnosis:  Bilateral low back pain without sciatica - Plan: PT plan of care cert/re-cert      Subjective Assessment - 10/12/14 1025    Subjective In 2004 fell on the job resulting in herniated disc at L3-4.  Bilateral neuropathy as well.  In [place] had ESI, electrical interventions, several rounds of PT went "good".  Liked prone lying and sitting on the ball.  Likes walking daily 1 hour  and on treadmill.  Thinking aobut joining [employer].  Going through pain management now.   Pertinent History Right knee Baker's cyst, OA both knees with regular cortisone injections; shingles   Limitations House hold activities;Standing  stress causes break out of shingles   How long can you sit comfortably? limited    How long can you  walk comfortably? 1 hour   Diagnostic tests x-ray, MRI   Patient Stated Goals freshen me up on some of the exercises that might help my back   Currently in Pain? Yes   Pain Score 8    Pain Location Back   Pain Orientation Lower;Right;Left   Pain Descriptors / Indicators Burning   Pain Type Chronic pain   Pain Onset More than a month ago   Pain Frequency Constant   Aggravating Factors  sitting straight, bending   Pain Relieving Factors sit leaning to side with leg crossed over; sit with legs propped up on something            [facility] PT Assessment - 10/12/14 1042    Assessment   Medical Diagnosis lumbago   Onset Date/Surgical Date --  2004   Hand Dominance Right   Next MD Visit 1 month   Prior Therapy yes   Precautions   Precautions None   Restrictions   Weight Bearing  Restrictions No   Balance Screen   Has the patient fallen in the past 6 months No   Has the patient had a decrease in activity level because of a fear of falling?  Yes   Is the patient reluctant to leave their home because of a fear of falling?  No   Home Environment   Living Environment Private residence   Available Help at Discharge --  none   Home Equipment Bonnie - single point   Prior Function   Level of Independence Independent with basic ADLs   Vocation On disability   Leisure computer   Observation/Other Assessments   Focus on Therapeutic Outcomes (FOTO)  Medicaid   Posture/Postural Control   Posture/Postural Control Postural limitations   Postural Limitations Forward head;Decreased lumbar lordosis   ROM / Strength   AROM / PROM / Strength AROM   AROM   Overall AROM  Other (comment)   Overall AROM Comments Painful knee flexion secondary to knee OA;  able to lie prone on elbows comfortably   AROM Assessment Site Lumbar   Lumbar Flexion 55   Lumbar Extension 15   Lumbar - Right Side Bend 25   Lumbar - Left Side Bend 25   Strength   Strength Assessment Site Lumbar   Lumbar Flexion 3/5   Lumbar Extension 3/5   Palpation   Palpation comment mildly tender lumbar paraspinals                           PT Education - 10/12/14 1114    Education provided Yes   Education Details Sit with lumbar roll, wedge for sleeping, supine abdominal brace, prone on elbows   Person(s) Educated Patient   Methods Explanation;Demonstration;Handout   Comprehension Verbalized understanding;Returned demonstration             PT Long Term Goals - 10/12/14 1122    PT LONG TERM GOAL #1   Title The patient will be instructed in basic self care techniques and low level exercises for strengthening, ROM and pain control   Time 1   Period Days   Status Achieved               Plan - 10/12/14 1115    Clinical Impression Statement The patient is a 59  year old  female with a long history of back pain following an injury at work in 2004.  She has had multiple previous medical and PT interventions  when she lived in .  She is now under pain management care wtih [facility].  She reports being limited in walking, standing, bending and sitting straight.  She is better with sitting with her legs propped up on a stool.  She is also very limited because of knee OA.  Decreased lumbar lordosis.  Decreased lumbar AROM:  flex 55, ext 15.  Decreased activation of transverse abdominals.  Patient reports relief with prone on elbows.  Due to changes in the [facility] Policy for rehab as of August 28, 2012, this patient does not have a qualifying diagnosis that is covered.  The patient is unable to pay out of pocket expenses at this time, therefore will not be seen for treatment.  She was instructed in sitting postural correction with lumbar roll, using a wedge for sleeping instead of multiple pillows, supine abdominal brace and prone on elbows.     PT Treatment/Interventions ADLs/Self Care Home Management;Therapeutic exercise         Problem List Patient Active Problem List   Diagnosis Date Noted  . Tear of lateral meniscus of right knee 10/13/2013  . Chest pain 07/23/2013  . GERD (gastroesophageal reflux disease) 07/23/2013  . Morbid obesity 07/23/2013  . Renal failure (ARF), acute on chronic 07/23/2013  . HLD (hyperlipidemia) 07/23/2013  . Dysarthria 10/05/2012  . Diarrhea 05/27/2012  . Chronic back pain 05/27/2012  . Diabetes mellitus 05/26/2012  . Hypothyroidism 05/26/2012  . 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 Depression 05/26/2012  . Generalized anxiety disorder 05/26/2012  . OSA (obstructive sleep apnea) 03/18/2011    Vivien Presto 10/12/2014, 11:27 AM  [facility] [address] [place], [place], [address] Phone: [phone]   Fax:  [phone]   Lavinia Sharps,  PT 10/12/2014 11:28 AM Phone: [phone] Fax: [phone]

## 2014-10-12 NOTE — Patient Instructions (Signed)
Sit with lumbar roll for lumbar support.  Wedge for sleeping to decrease so many pillows under head.  Supine abdominal brace;   Prone on elbows 3-5 min 2x/day

## 2014-10-20 ENCOUNTER — Emergency Department (HOSPITAL_COMMUNITY)
Admission: EM | Admit: 2014-10-20 | Discharge: 2014-10-20 | Disposition: A | Discharge status: Home / Self Care | Payer: Medicaid Other | Attending: Emergency Medicine | Admitting: Emergency Medicine

## 2014-10-20 ENCOUNTER — Encounter (HOSPITAL_COMMUNITY): Payer: Self-pay | Admitting: Nurse Practitioner

## 2014-10-20 DIAGNOSIS — G8929 Other chronic pain: Secondary | ICD-10-CM | POA: Insufficient documentation

## 2014-10-20 DIAGNOSIS — M069 Rheumatoid arthritis, unspecified: Secondary | ICD-10-CM | POA: Diagnosis not present

## 2014-10-20 DIAGNOSIS — E119 Type 2 diabetes mellitus without complications: Secondary | ICD-10-CM | POA: Diagnosis not present

## 2014-10-20 DIAGNOSIS — Z720 Tobacco use: Secondary | ICD-10-CM | POA: Insufficient documentation

## 2014-10-20 DIAGNOSIS — M436 Torticollis: Secondary | ICD-10-CM | POA: Diagnosis not present

## 2014-10-20 DIAGNOSIS — Z79899 Other long term (current) drug therapy: Secondary | ICD-10-CM | POA: Insufficient documentation

## 2014-10-20 DIAGNOSIS — I10 Essential (primary) hypertension: Secondary | ICD-10-CM | POA: Insufficient documentation

## 2014-10-20 DIAGNOSIS — K219 Gastro-esophageal reflux disease without esophagitis: Secondary | ICD-10-CM | POA: Diagnosis not present

## 2014-10-20 DIAGNOSIS — F411 Generalized anxiety disorder: Secondary | ICD-10-CM | POA: Insufficient documentation

## 2014-10-20 DIAGNOSIS — R011 Cardiac murmur, unspecified: Secondary | ICD-10-CM | POA: Diagnosis not present

## 2014-10-20 DIAGNOSIS — Z8619 Personal history of other infectious and parasitic diseases: Secondary | ICD-10-CM | POA: Insufficient documentation

## 2014-10-20 DIAGNOSIS — G4733 Obstructive sleep apnea (adult) (pediatric): Secondary | ICD-10-CM | POA: Diagnosis not present

## 2014-10-20 DIAGNOSIS — Z880 Allergy status to penicillin: Secondary | ICD-10-CM | POA: Insufficient documentation

## 2014-10-20 DIAGNOSIS — G629 Polyneuropathy, unspecified: Secondary | ICD-10-CM | POA: Insufficient documentation

## 2014-10-20 DIAGNOSIS — E785 Hyperlipidemia, unspecified: Secondary | ICD-10-CM | POA: Diagnosis not present

## 2014-10-20 DIAGNOSIS — F329 Major depressive disorder, single episode, unspecified: Secondary | ICD-10-CM | POA: Diagnosis not present

## 2014-10-20 DIAGNOSIS — Z7951 Long term (current) use of inhaled steroids: Secondary | ICD-10-CM | POA: Insufficient documentation

## 2014-10-20 DIAGNOSIS — Z780 Asymptomatic menopausal state: Secondary | ICD-10-CM | POA: Diagnosis not present

## 2014-10-20 DIAGNOSIS — M542 Cervicalgia: Secondary | ICD-10-CM | POA: Diagnosis present

## 2014-10-20 DIAGNOSIS — J449 Chronic obstructive pulmonary disease, unspecified: Secondary | ICD-10-CM | POA: Diagnosis not present

## 2014-10-20 MED ORDER — PREDNISONE 10 MG PO TABS: ORAL_TABLET | ORAL | Status: DC

## 2014-10-20 MED ORDER — CYCLOBENZAPRINE HCL 10 MG PO TABS: 10.0000 mg | ORAL_TABLET | Freq: Two times a day (BID) | ORAL | Status: DC | PRN

## 2014-10-20 NOTE — ED Notes (Signed)
She woke 4 weeks ago with what she thought was a crick in her neck, pain has persisted since and has started to radiate into upper baCK, R arm, L leg. She tried vicodin with no relief. Pain increased with lying on arm, movement. Ambulatory, mae

## 2014-10-20 NOTE — ED Notes (Signed)
Pt is in stable condition upon d/c and ambulates from ED. 

## 2014-10-20 NOTE — ED Provider Notes (Signed)
CSN: [id number]     Arrival date & time 10/20/14  1451 History  This chart was scribed for non-physician practitioner, Kerrie Buffalo, Vanessa [name], working with Arby Barrette, MD, by Ronney Lion, ED Scribe. This patient was seen in room TR06C/TR06C and the patient's care was started at 3:53 PM.    Chief Complaint  Patient presents with  . Neck Pain   The history is provided by the patient. No language interpreter was used.    HPI Comments: Vanessa [name] is a 59 y.o. female with a history of DM, arthritis, and L3-L4 herniated disc, who presents to the Emergency Department complaining of neck pain radiating to her trapezius that began 3-4 weeks ago when patient woke up with a crick in her neck. She states at night, the pain radiates to her right shoulder. Patient denies any known trauma or injury, although she suspects that either slept wrong or might have developed a new herniated disc. Patient tried applying a heating pad to the area with moderate relief, but she adds she was burned after keeping it on overnight, even though the heat setting was on low. She notes a history of chronic low back pain due to a history of L3-L4 herniated disc, for which she takes 10-325 Norco; however, she states she prefers not to stay on the pain medication long term, as her son died of OD and she states she is wary of "any medication that causes swelling." She denies chest pain, SOB, nausea, or vomiting. Patient states she sees Dr. Eulah Pont, who gives her steroid injections in her buttocks and bilateral knees every 6 months, which is supposed to help her back pain; she last had the injections 4 months ago.  PCP: Dr. Mayford Knife   Past Medical History  Diagnosis Date  . Hypertension   . Heart murmur   . Asthma   . Emphysema   . Diabetes mellitus   . Hyperlipidemia   . Allergic rhinitis   . Chronic headache   . Neuropathy   . Grave's disease   . Herniated disc   . COPD (chronic obstructive pulmonary disease)   . Menopause    . Hernia, umbilical   . Anxiety   . Depression   . Back pain, chronic   . Shingles   . Herniated disc   . Arthritis     rheumatoid  . 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 Generalized anxiety disorder 05/26/2012  . Chronic back pain 05/27/2012    Secondary to L4-5 herniated disc per patient  . OSA (obstructive sleep apnea)     has a cpap  . Wears glasses   . Wears dentures     top  . Tear of lateral meniscus of right knee 10/13/2013  . Alcoholism   . Sleep apnea     wear c-pap  . Allergy   . Emphysema of lung   . GERD (gastroesophageal reflux disease)   . Osteoporosis    Past Surgical History  Procedure Laterality Date  . Cholecystectomy    . Tubal ligation    . Foot surgery      cyst rt foot  . Laparoscopy      age 68  . Eye surgery      eye back in socker-rt  . Knee arthroscopy with lateral menisectomy Right 10/13/2013    Procedure: RIGHT KNEE ARTHROSCOPY WITH PARTIAL LATERAL MENISECTOMY/DEBRIDEMENT/SHAVING ;  Surgeon: Eulas Post, MD;  Location: [facility];  Service: Orthopedics;  Laterality: Right;  . Left heart catheterization with coronary angiogram N/A 05/30/2012    Procedure: LEFT HEART CATHETERIZATION WITH CORONARY ANGIOGRAM;  Surgeon: [name], MD;  Location: [facility] CATH LAB;  Service: Cardiovascular;  Laterality: N/A;   Family History  Problem Relation Age of Onset  . Emphysema Mother   . Allergies Mother   . Asthma Mother   . Heart disease Mother   . Rheum arthritis Mother   . Diabetes Mother   . Kidney disease Mother   . Allergies Daughter   . Asthma Brother   . Breast cancer Other     aunt  . Lung cancer Brother   . Throat cancer Brother   . Colon cancer Neg Hx   . Colon polyps Neg Hx   . Esophageal cancer Neg Hx   . Rectal cancer Neg Hx   . Stomach cancer Neg Hx    History  Substance Use Topics  . Smoking status: Current Every Day Smoker -- 1.00 packs/day  for 40 years    Types: Cigarettes  . Smokeless tobacco: Never Used  . Alcohol Use: No   OB History    Gravida Para Term Preterm AB TAB SAB Ectopic Multiple Living   5 3   2  2   3      Review of Systems  Respiratory: Negative for shortness of breath.   Cardiovascular: Negative for chest pain.  Gastrointestinal: Negative for nausea and vomiting.  Musculoskeletal: Positive for neck pain. Back pain: chronic.  All other systems reviewed and are negative.  Allergies  Ciprofloxacin; Fluocinolone; and Penicillins  Home Medications   Prior to Admission medications   Medication Sig Start Date End Date Taking? Authorizing Provider  acyclovir (ZOVIRAX) 400 MG tablet Take 800 mg by mouth every morning.     Historical Provider, MD  albuterol (PROAIR HFA) 108 (90 BASE) MCG/ACT inhaler Inhale 2 puffs into the lungs 4 (four) times daily. For COPD    Historical Provider, MD  atorvastatin (LIPITOR) 40 MG tablet Take 1 tablet (40 mg total) by mouth daily. 10/06/12   12/07/12, DO  beclomethasone (QVAR) 80 MCG/ACT inhaler Inhale 2 puffs into the lungs 2 (two) times daily.     Historical Provider, MD  busPIRone (BUSPAR) 10 MG tablet Take 20 mg by mouth 3 (three) times daily.     Historical Provider, MD  cyclobenzaprine (FLEXERIL) 10 MG tablet Take 1 tablet (10 mg total) by mouth 2 (two) times daily as needed for muscle spasms. 10/20/14   Kaavya Puskarich 10/22/14, Vanessa [name]  cycloSPORINE (RESTASIS) 0.05 % ophthalmic emulsion Place 1 drop into both eyes 2 (two) times daily.    Historical Provider, MD  diclofenac sodium (VOLTAREN) 1 % GEL Apply 2 g topically 4 (four) times daily as needed (pain).     Historical Provider, MD  FLUoxetine (PROZAC) 20 MG capsule Take 60 mg by mouth every morning.     Historical Provider, MD  glipiZIDE (GLUCOTROL) 10 MG tablet Take 10 mg by mouth 2 (two) times daily before a meal.    Historical Provider, MD  hydrochlorothiazide (HYDRODIURIL) 25 MG tablet Take 25 mg by mouth every morning.      Historical Provider, MD  HYDROcodone-acetaminophen (NORCO) 10-325 MG per tablet Take 1 tablet by mouth every 6 (six) hours as needed for moderate pain.  02/05/14   Historical Provider, MD  ipratropium-albuterol (DUONEB) 0.5-2.5 (3) MG/3ML SOLN Take 3 mLs by nebulization every 6 (six)  hours as needed (Asthma).    Historical Provider, MD  levothyroxine (SYNTHROID, LEVOTHROID) 175 MCG tablet Take 175 mcg by mouth every morning.     Historical Provider, MD  lisinopril (PRINIVIL,ZESTRIL) 10 MG tablet Take 10 mg by mouth daily.    Historical Provider, MD  loratadine (CLARITIN) 10 MG tablet Take 10 mg by mouth daily as needed for allergies.    Historical Provider, MD  metFORMIN (GLUCOPHAGE) 1000 MG tablet Take 1,000 mg by mouth 2 (two) times daily.     Historical Provider, MD  mirtazapine (REMERON) 45 MG tablet Take 45 mg by mouth at bedtime as needed (Sleep). For insomnia    Historical Provider, MD  pantoprazole (PROTONIX) 40 MG tablet Take 1 tablet (40 mg total) by mouth 2 (two) times daily. 07/25/13   Zannie Cove, MD  predniSONE (DELTASONE) 10 MG tablet Take 6 tablets PO  today then 5, 4, 3, 2, 1 10/20/14   Vanessa Aldaco Orlene Och, Vanessa [name]  pregabalin (LYRICA) 25 MG capsule Take 75 mg by mouth 3 (three) times daily.     Historical Provider, MD  QUEtiapine (SEROQUEL) 25 MG tablet Take 25 mg by mouth at bedtime.    Historical Provider, MD  sennosides-docusate sodium (SENOKOT-S) 8.6-50 MG tablet Take 2 tablets by mouth daily. Patient taking differently: Take 2 tablets by mouth daily as needed for constipation.  10/13/13   Teryl Lucy, MD  traMADol (ULTRAM) 50 MG tablet Take 50 mg by mouth every 6 (six) hours as needed for moderate pain.  02/05/14   Historical Provider, MD  verapamil (CALAN) 40 MG tablet Take 40 mg by mouth 3 (three) times daily.    Historical Provider, MD   BP 124/75 mmHg  Pulse 92  Temp(Src) 98.7 F (37.1 C) (Oral)  Resp 16  Ht 5\' 9"  (1.753 m)  Wt 213 lb 12.8 oz (96.979 kg)  BMI 31.56 kg/m2  SpO2  97% Physical Exam  Constitutional: She is oriented to person, place, and time. She appears well-developed and well-nourished. No distress.  HENT:  Head: Normocephalic and atraumatic.  Eyes: Conjunctivae and EOM are normal.  Neck: Neck supple. No JVD present. Carotid bruit is not present. No tracheal deviation present.  Cardiovascular: Normal rate and regular rhythm.  Exam reveals no gallop and no friction rub.   No murmur heard. Pulmonary/Chest: Effort normal. No respiratory distress.  Musculoskeletal: Normal range of motion. She exhibits tenderness.  No cervical spinal tenderness. Muscular tenderness over the right sternocleidomastoid. No tenderness with palpation of the shoulder or upper arm. Muscle spasm to the right of the thoracic spine. Radial pulses 2+. Grip strength equal bilaterally. Reflexes 2+ bilaterally. FROM of neck.  Neurological: She is alert and oriented to person, place, and time. She has normal reflexes.  Skin: Skin is warm and dry.  1 cm burn to the left thoracic area, secondary to heating pad. Healing, without signs of infection.   Psychiatric: She has a normal mood and affect. Her behavior is normal.  Nursing note and vitals reviewed.   ED Course  Procedures (including critical care time)  DIAGNOSTIC STUDIES: Oxygen Saturation is 97% on RA, normal by my interpretation.    COORDINATION OF CARE: 4:05 PM - Suspect muscle spasm. Discussed treatment plan with pt at bedside which includes Rx steroids and muscle relaxants, and f/u with either Dr. Mayford Knife or Dr. Eulah Pont. Pt cautioned to avoid driving or operating heavy machinery while on muscle relaxants. Strict return precautions given; pt told to monitor for new or worsening  symptoms, including chest pain. Pt also advised to continue to use the heating pad on low with a towel, and to use caution not to fall asleep while using it. Pt verbalized understanding and agreed to plan.  MDM  59 y.o. female with right side neck  pain that she woke with a few weeks ago and has continued. Stable for d/c without focal neuro deficits. Will treat with muscle relaxants and she will follow up with her doctor. Discussed with the patient and all questioned fully answered.  Final diagnoses:  Torticollis, acute    I personally performed the services described in this documentation, which was scribed in my presence. The recorded information has been reviewed and is accurate.    [address] [place], [place] 10/22/14 1856  Arby Barrette, MD 10/28/14 [phone]

## 2014-10-20 NOTE — Discharge Instructions (Signed)
Follow up with your doctor.  Return here as needed 

## 2014-10-26 ENCOUNTER — Emergency Department (HOSPITAL_COMMUNITY)
Admission: EM | Admit: 2014-10-26 | Discharge: 2014-10-26 | Disposition: A | Discharge status: Home / Self Care | Payer: Medicaid Other | Attending: Emergency Medicine | Admitting: Emergency Medicine

## 2014-10-26 ENCOUNTER — Emergency Department (HOSPITAL_COMMUNITY): Payer: Medicaid Other

## 2014-10-26 ENCOUNTER — Encounter (HOSPITAL_COMMUNITY): Payer: Self-pay | Admitting: Emergency Medicine

## 2014-10-26 DIAGNOSIS — E785 Hyperlipidemia, unspecified: Secondary | ICD-10-CM | POA: Insufficient documentation

## 2014-10-26 DIAGNOSIS — Y929 Unspecified place or not applicable: Secondary | ICD-10-CM | POA: Insufficient documentation

## 2014-10-26 DIAGNOSIS — Z7951 Long term (current) use of inhaled steroids: Secondary | ICD-10-CM | POA: Diagnosis not present

## 2014-10-26 DIAGNOSIS — Z880 Allergy status to penicillin: Secondary | ICD-10-CM | POA: Insufficient documentation

## 2014-10-26 DIAGNOSIS — M069 Rheumatoid arthritis, unspecified: Secondary | ICD-10-CM | POA: Insufficient documentation

## 2014-10-26 DIAGNOSIS — Z7952 Long term (current) use of systemic steroids: Secondary | ICD-10-CM | POA: Insufficient documentation

## 2014-10-26 DIAGNOSIS — Y999 Unspecified external cause status: Secondary | ICD-10-CM | POA: Diagnosis not present

## 2014-10-26 DIAGNOSIS — Y939 Activity, unspecified: Secondary | ICD-10-CM | POA: Insufficient documentation

## 2014-10-26 DIAGNOSIS — I10 Essential (primary) hypertension: Secondary | ICD-10-CM | POA: Insufficient documentation

## 2014-10-26 DIAGNOSIS — W228XXA Striking against or struck by other objects, initial encounter: Secondary | ICD-10-CM | POA: Diagnosis not present

## 2014-10-26 DIAGNOSIS — E119 Type 2 diabetes mellitus without complications: Secondary | ICD-10-CM | POA: Diagnosis not present

## 2014-10-26 DIAGNOSIS — Z8619 Personal history of other infectious and parasitic diseases: Secondary | ICD-10-CM | POA: Insufficient documentation

## 2014-10-26 DIAGNOSIS — K219 Gastro-esophageal reflux disease without esophagitis: Secondary | ICD-10-CM | POA: Diagnosis not present

## 2014-10-26 DIAGNOSIS — Z720 Tobacco use: Secondary | ICD-10-CM | POA: Diagnosis not present

## 2014-10-26 DIAGNOSIS — G4733 Obstructive sleep apnea (adult) (pediatric): Secondary | ICD-10-CM | POA: Insufficient documentation

## 2014-10-26 DIAGNOSIS — Z87828 Personal history of other (healed) physical injury and trauma: Secondary | ICD-10-CM | POA: Diagnosis not present

## 2014-10-26 DIAGNOSIS — F419 Anxiety disorder, unspecified: Secondary | ICD-10-CM | POA: Insufficient documentation

## 2014-10-26 DIAGNOSIS — R011 Cardiac murmur, unspecified: Secondary | ICD-10-CM | POA: Diagnosis not present

## 2014-10-26 DIAGNOSIS — J449 Chronic obstructive pulmonary disease, unspecified: Secondary | ICD-10-CM | POA: Diagnosis not present

## 2014-10-26 DIAGNOSIS — S8991XA Unspecified injury of right lower leg, initial encounter: Secondary | ICD-10-CM | POA: Diagnosis not present

## 2014-10-26 DIAGNOSIS — Z79899 Other long term (current) drug therapy: Secondary | ICD-10-CM | POA: Insufficient documentation

## 2014-10-26 DIAGNOSIS — G8929 Other chronic pain: Secondary | ICD-10-CM | POA: Insufficient documentation

## 2014-10-26 DIAGNOSIS — S99921A Unspecified injury of right foot, initial encounter: Secondary | ICD-10-CM | POA: Diagnosis present

## 2014-10-26 DIAGNOSIS — J45909 Unspecified asthma, uncomplicated: Secondary | ICD-10-CM | POA: Diagnosis not present

## 2014-10-26 DIAGNOSIS — Z9889 Other specified postprocedural states: Secondary | ICD-10-CM | POA: Diagnosis not present

## 2014-10-26 DIAGNOSIS — S90121A Contusion of right lesser toe(s) without damage to nail, initial encounter: Secondary | ICD-10-CM | POA: Diagnosis not present

## 2014-10-26 DIAGNOSIS — Z9981 Dependence on supplemental oxygen: Secondary | ICD-10-CM | POA: Insufficient documentation

## 2014-10-26 DIAGNOSIS — S8992XA Unspecified injury of left lower leg, initial encounter: Secondary | ICD-10-CM | POA: Insufficient documentation

## 2014-10-26 MED ORDER — ONDANSETRON 4 MG PO TBDP
4.0000 mg | ORAL_TABLET | Freq: Once | ORAL | Status: AC
  Administered 2014-10-26: 4 mg via ORAL
  Filled 2014-10-26: qty 1

## 2014-10-26 MED ORDER — HYDROMORPHONE HCL 1 MG/ML IJ SOLN
2.0000 mg | Freq: Once | INTRAMUSCULAR | Status: AC
  Administered 2014-10-26: 2 mg via INTRAMUSCULAR
  Filled 2014-10-26: qty 2

## 2014-10-26 MED ORDER — KETOROLAC TROMETHAMINE 60 MG/2ML IM SOLN
60.0000 mg | Freq: Once | INTRAMUSCULAR | Status: AC
  Administered 2014-10-26: 60 mg via INTRAMUSCULAR
  Filled 2014-10-26: qty 2

## 2014-10-26 NOTE — ED Notes (Signed)
Pt verbalizes understanding of d/c instructions and denies any further needs at this time. 

## 2014-10-26 NOTE — ED Notes (Signed)
Pt. reports bilateral knee pain with swelling onset this morning , pt. also reported right 4th toe pain accidentally hit against furniture yesterday . Ambulatory .

## 2014-10-26 NOTE — ED Provider Notes (Signed)
CSN: [id number]     Arrival date & time 10/26/14  2033 History  This chart was scribed for non-physician practitioner, Langston Masker, PA-C, working with Tilden Fossa, MD, by Ronney Lion, ED Scribe. This patient was seen in room TR06C/TR06C and the patient's care was started at 9:02 PM.     Chief Complaint  Patient presents with  . Toe Injury  . Knee Pain   The history is provided by the patient. No language interpreter was used.    HPI Comments: Vanessa [name] is a 59 y.o. female with a history of DM and L3-L4 herniated disc, who presents to the Emergency Department complaining of of constant, 10/10 right fourth toe pain afer she accidentally stubbed it on furniture yesterday. She tried ice, heat, buddy wrapping her toes, and elevating her right leg, all with no relief. Patient states she then developed constant, aching bilateral knee pain and swelling that began this morning. Patient is able to ambulate but reports her legs feel "heavy" whenever she walks. Patient was evaluated for right sided neck pain here in the ED 6 days ago (10/20/14) and prescribed a prednisone course, which she finished, and cyclobenzaprine which she has been taking with no relief for her current symptoms. Patient states she also took 5 Norco 10-325 pills today, which have only been providing minimal relief; patient is currently under a pain contract for these. She adds that they have been upsetting her stomach because she has taken so many to try to alleviate her pain today.  Past Medical History  Diagnosis Date  . Hypertension   . Heart murmur   . Asthma   . Emphysema   . Diabetes mellitus   . Hyperlipidemia   . Allergic rhinitis   . Chronic headache   . Neuropathy   . Grave's disease   . Herniated disc   . COPD (chronic obstructive pulmonary disease)   . Menopause   . Hernia, umbilical   . Anxiety   . Depression   . Back pain, chronic   . Shingles   . Herniated disc   . Arthritis     rheumatoid  .  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 Generalized anxiety disorder 05/26/2012  . Chronic back pain 05/27/2012    Secondary to L4-5 herniated disc per patient  . OSA (obstructive sleep apnea)     has a cpap  . Wears glasses   . Wears dentures     top  . Tear of lateral meniscus of right knee 10/13/2013  . Alcoholism   . Sleep apnea     wear c-pap  . Allergy   . Emphysema of lung   . GERD (gastroesophageal reflux disease)   . Osteoporosis    Past Surgical History  Procedure Laterality Date  . Cholecystectomy    . Tubal ligation    . Foot surgery      cyst rt foot  . Laparoscopy      age 59  . Eye surgery      eye back in socker-rt  . Knee arthroscopy with lateral menisectomy Right 10/13/2013    Procedure: RIGHT KNEE ARTHROSCOPY WITH PARTIAL LATERAL MENISECTOMY/DEBRIDEMENT/SHAVING ;  Surgeon: Eulas Post, MD;  Location: [facility];  Service: Orthopedics;  Laterality: Right;  . Left heart catheterization with coronary angiogram N/A 05/30/2012    Procedure: LEFT HEART CATHETERIZATION WITH CORONARY ANGIOGRAM;  Surgeon: [name], MD;  Location:  [facility] CATH LAB;  Service: Cardiovascular;  Laterality: N/A;   Family History  Problem Relation Age of Onset  . Emphysema Mother   . Allergies Mother   . Asthma Mother   . Heart disease Mother   . Rheum arthritis Mother   . Diabetes Mother   . Kidney disease Mother   . Allergies Daughter   . Asthma Brother   . Breast cancer Other     aunt  . Lung cancer Brother   . Throat cancer Brother   . Colon cancer Neg Hx   . Colon polyps Neg Hx   . Esophageal cancer Neg Hx   . Rectal cancer Neg Hx   . Stomach cancer Neg Hx    History  Substance Use Topics  . Smoking status: Current Every Day Smoker -- 1.00 packs/day for 40 years    Types: Cigarettes  . Smokeless tobacco: Never Used  . Alcohol Use: No   OB History    Gravida Para Term Preterm AB TAB  SAB Ectopic Multiple Living   5 3   2  2   3      Review of Systems  Musculoskeletal: Positive for myalgias (right fourth toe pain) and arthralgias (bilateral knee pain).  All other systems reviewed and are negative.  Allergies  Ciprofloxacin; Fluocinolone; and Penicillins  Home Medications   Prior to Admission medications   Medication Sig Start Date End Date Taking? Authorizing Provider  acyclovir (ZOVIRAX) 400 MG tablet Take 800 mg by mouth every morning.     Historical Provider, MD  albuterol (PROAIR HFA) 108 (90 BASE) MCG/ACT inhaler Inhale 2 puffs into the lungs 4 (four) times daily. For COPD    Historical Provider, MD  atorvastatin (LIPITOR) 40 MG tablet Take 1 tablet (40 mg total) by mouth daily. 10/06/12   12/07/12, DO  beclomethasone (QVAR) 80 MCG/ACT inhaler Inhale 2 puffs into the lungs 2 (two) times daily.     Historical Provider, MD  busPIRone (BUSPAR) 10 MG tablet Take 20 mg by mouth 3 (three) times daily.     Historical Provider, MD  cyclobenzaprine (FLEXERIL) 10 MG tablet Take 1 tablet (10 mg total) by mouth 2 (two) times daily as needed for muscle spasms. 10/20/14   Hope 10/22/14, NP  cycloSPORINE (RESTASIS) 0.05 % ophthalmic emulsion Place 1 drop into both eyes 2 (two) times daily.    Historical Provider, MD  diclofenac sodium (VOLTAREN) 1 % GEL Apply 2 g topically 4 (four) times daily as needed (pain).     Historical Provider, MD  FLUoxetine (PROZAC) 20 MG capsule Take 60 mg by mouth every morning.     Historical Provider, MD  glipiZIDE (GLUCOTROL) 10 MG tablet Take 10 mg by mouth 2 (two) times daily before a meal.    Historical Provider, MD  hydrochlorothiazide (HYDRODIURIL) 25 MG tablet Take 25 mg by mouth every morning.     Historical Provider, MD  HYDROcodone-acetaminophen (NORCO) 10-325 MG per tablet Take 1 tablet by mouth every 6 (six) hours as needed for moderate pain.  02/05/14   Historical Provider, MD  ipratropium-albuterol (DUONEB) 0.5-2.5 (3) MG/3ML SOLN  Take 3 mLs by nebulization every 6 (six) hours as needed (Asthma).    Historical Provider, MD  levothyroxine (SYNTHROID, LEVOTHROID) 175 MCG tablet Take 175 mcg by mouth every morning.     Historical Provider, MD  lisinopril (PRINIVIL,ZESTRIL) 10 MG tablet Take 10 mg by mouth daily.    Historical Provider, MD  loratadine (CLARITIN) 10  MG tablet Take 10 mg by mouth daily as needed for allergies.    Historical Provider, MD  metFORMIN (GLUCOPHAGE) 1000 MG tablet Take 1,000 mg by mouth 2 (two) times daily.     Historical Provider, MD  mirtazapine (REMERON) 45 MG tablet Take 45 mg by mouth at bedtime as needed (Sleep). For insomnia    Historical Provider, MD  pantoprazole (PROTONIX) 40 MG tablet Take 1 tablet (40 mg total) by mouth 2 (two) times daily. 07/25/13   Zannie Cove, MD  predniSONE (DELTASONE) 10 MG tablet Take 6 tablets PO  today then 5, 4, 3, 2, 1 10/20/14   Hope Orlene Och, NP  pregabalin (LYRICA) 25 MG capsule Take 75 mg by mouth 3 (three) times daily.     Historical Provider, MD  QUEtiapine (SEROQUEL) 25 MG tablet Take 25 mg by mouth at bedtime.    Historical Provider, MD  sennosides-docusate sodium (SENOKOT-S) 8.6-50 MG tablet Take 2 tablets by mouth daily. Patient taking differently: Take 2 tablets by mouth daily as needed for constipation.  10/13/13   Teryl Lucy, MD  traMADol (ULTRAM) 50 MG tablet Take 50 mg by mouth every 6 (six) hours as needed for moderate pain.  02/05/14   Historical Provider, MD  verapamil (CALAN) 40 MG tablet Take 40 mg by mouth 3 (three) times daily.    Historical Provider, MD   BP 112/78 mmHg  Pulse 108  Temp(Src) 99.5 F (37.5 C) (Oral)  Resp 22  Wt 218 lb 3.2 oz (98.975 kg)  SpO2 97% Physical Exam  Constitutional: She is oriented to person, place, and time. She appears well-developed and well-nourished. No distress.  HENT:  Head: Normocephalic and atraumatic.  Eyes: Conjunctivae and EOM are normal.  Exophthalmos.  Neck: Neck supple. No tracheal  deviation present.  Cardiovascular: Normal rate.   Pulmonary/Chest: Effort normal. No respiratory distress.  Musculoskeletal: Normal range of motion.  Swelling right fifth toe. Slight swelling left knee.   Neurological: She is alert and oriented to person, place, and time.  Skin: Skin is warm and dry.  Psychiatric: She has a normal mood and affect. Her behavior is normal.  Nursing note and vitals reviewed.   ED Course  Procedures (including critical care time)  DIAGNOSTIC STUDIES: Oxygen Saturation is 97% on RA, normal by my interpretation.    COORDINATION OF CARE: 9:03 PM - Discussed treatment plan with pt at bedside which includes XRs and a CBG check. Also discussed with patient that we cannot prescribe any oral narcotic medications for her, as these would violate her pain contract, but we can perform analgesic injections in the ED. Pt verbalized understanding and agreed to plan.    Labs Review Labs Reviewed - No data to display  Imaging Review Dg Foot Complete Right  10/26/2014   CLINICAL DATA:  Injury to foot. Kicked furniture. Fourth digit pain  EXAM: RIGHT FOOT COMPLETE - 3+ VIEW  COMPARISON:  None.  FINDINGS: No fracture or dislocation of mid foot or forefoot. The phalanges are normal. The calcaneus is normal. No soft tissue abnormality.  IMPRESSION: No fracture or dislocation.   Electronically Signed   By: Genevive Bi M.D.   On: 10/26/2014 21:49    MDM   Final diagnoses:  Contusion, toe, right, initial encounter    Ice Elevate See your Physicain for recheck on Monday as scheduled [name]    I personally performed the services in this documentation, which was scribed in my presence.  The recorded information has  been reviewed and considered.   Barnet Pall.  Elson Areas, PA-C 10/26/14 2211  Tilden Fossa, MD 10/27/14 [phone]

## 2014-10-26 NOTE — Discharge Instructions (Signed)
Contusion A contusion is a deep bruise. Contusions are the result of an injury that caused bleeding under the skin. The contusion may turn blue, purple, or yellow. Minor injuries will give you a painless contusion, but more severe contusions may stay painful and swollen for a few weeks.  CAUSES  A contusion is usually caused by a blow, trauma, or direct force to an area of the body. SYMPTOMS   Swelling and redness of the injured area.  Bruising of the injured area.  Tenderness and soreness of the injured area.  Pain. DIAGNOSIS  The diagnosis can be made by taking a history and physical exam. An X-ray, CT scan, or MRI may be needed to determine if there were any associated injuries, such as fractures. TREATMENT  Specific treatment will depend on what area of the body was injured. In general, the best treatment for a contusion is resting, icing, elevating, and applying cold compresses to the injured area. Over-the-counter medicines may also be recommended for pain control. Ask your caregiver what the best treatment is for your contusion. HOME CARE INSTRUCTIONS   Put ice on the injured area.  Put ice in a plastic bag.  Place a towel between your skin and the bag.  Leave the ice on for 15-20 minutes, 3-4 times a day, or as directed by your health care provider.  Only take over-the-counter or prescription medicines for pain, discomfort, or fever as directed by your caregiver. Your caregiver may recommend avoiding anti-inflammatory medicines (aspirin, ibuprofen, and naproxen) for 48 hours because these medicines may increase bruising.  Rest the injured area.  If possible, elevate the injured area to reduce swelling. SEEK IMMEDIATE MEDICAL CARE IF:   You have increased bruising or swelling.  You have pain that is getting worse.  Your swelling or pain is not relieved with medicines. MAKE SURE YOU:   Understand these instructions.  Will watch your condition.  Will get help right  away if you are not doing well or get worse. Document Released: 12/24/2004 Document Revised: 03/21/2013 Document Reviewed: 01/19/2011 [facility] Patient Information 2015 [place], [place]. This information is not intended to replace advice given to you by your health care provider. Make sure you discuss any questions you have with your health care provider. Chronic Back Pain  When back pain lasts longer than 3 months, it is called chronic back pain.People with chronic back pain often go through certain periods that are more intense (flare-ups).  CAUSES Chronic back pain can be caused by wear and tear (degeneration) on different structures in your back. These structures include:  The bones of your spine (vertebrae) and the joints surrounding your spinal cord and nerve roots (facets).  The strong, fibrous tissues that connect your vertebrae (ligaments). Degeneration of these structures may result in pressure on your nerves. This can lead to constant pain. HOME CARE INSTRUCTIONS  Avoid bending, heavy lifting, prolonged sitting, and activities which make the problem worse.  Take brief periods of rest throughout the day to reduce your pain. Lying down or standing usually is better than sitting while you are resting.  Take over-the-counter or prescription medicines only as directed by your caregiver. SEEK IMMEDIATE MEDICAL CARE IF:   You have weakness or numbness in one of your legs or feet.  You have trouble controlling your bladder or bowels.  You have nausea, vomiting, abdominal pain, shortness of breath, or fainting. Document Released: 04/23/2004 Document Revised: 06/08/2011 Document Reviewed: 02/28/2011 [facility] Patient Information 2015 [place], [place]. This information is  not intended to replace advice given to you by your health care provider. Make sure you discuss any questions you have with your health care provider. ° °

## 2014-11-20 ENCOUNTER — Encounter: Payer: Self-pay | Admitting: Gastroenterology

## 2014-11-28 ENCOUNTER — Encounter: Payer: Self-pay | Admitting: Pulmonary Disease

## 2014-11-28 ENCOUNTER — Ambulatory Visit (INDEPENDENT_AMBULATORY_CARE_PROVIDER_SITE_OTHER)
Admission: RE | Admit: 2014-11-28 | Discharge: 2014-11-28 | Disposition: A | Discharge status: Home / Self Care | Payer: Medicaid Other | Source: Ambulatory Visit | Attending: Pulmonary Disease | Admitting: Pulmonary Disease

## 2014-11-28 ENCOUNTER — Ambulatory Visit (INDEPENDENT_AMBULATORY_CARE_PROVIDER_SITE_OTHER): Payer: Medicaid Other | Admitting: Pulmonary Disease

## 2014-11-28 VITALS — BP 132/80 | HR 97 | Ht 69.0 in | Wt 217.6 lb

## 2014-11-28 DIAGNOSIS — Z720 Tobacco use: Secondary | ICD-10-CM

## 2014-11-28 DIAGNOSIS — J449 Chronic obstructive pulmonary disease, unspecified: Secondary | ICD-10-CM | POA: Diagnosis not present

## 2014-11-28 DIAGNOSIS — E669 Obesity, unspecified: Secondary | ICD-10-CM | POA: Diagnosis not present

## 2014-11-28 DIAGNOSIS — G4733 Obstructive sleep apnea (adult) (pediatric): Secondary | ICD-10-CM | POA: Diagnosis not present

## 2014-11-28 NOTE — Progress Notes (Signed)
Chief Complaint  Patient presents with  . Follow-up    former [facility] pt. Pt reports she is not able to wear CPAP at night. She has had problems w/ machine and mask. She has been having problems trying to get supplies from [facility]. Pt humidifier no longer connected to machine.     History of Present Illness: Vanessa [name] is a 59 y.o. female smoker with OSA and COPD.  She was previously followed by Dr. Shelle Iron.  She continues to smoke.  She tried nicotine patch and this seemed to help her cravings.  She is worried about being put on any extra medication for smoking cessation.  She has cough with clear sputum.  She gets occasionally wheezing.  She has chronic hoarseness.  She has been using albuterol/ipratropium in nebulizer and this helps.  She has not used Qvar for some time.  She gets winded with moderate exertion, but does okay at slow pace.  She is not able to use CPAP as much as she would like.  She has trouble with her mask fit.  She reports that her DME told her that Medicaid doesn't pay well for CPAP, and therefore they are not able to get her supplies.   TESTS: PSG 02/15/06 > AHI 6 Echo 07/24/13 >> EF 55 to 60%, grade 1 diastolic dysfx, mild AI  PMhx >> HTN, DM, HLD, HA, Neuropathy, Grave's disease with hypothyroidism, Anxiety, Depression, Chronic back pain, Rheumatoid arthritis, GERD, COPD/asthma  Past surgical hx, Medications, Allergies, Family hx, Social hx all reviewed.   Physical Exam: BP 132/80 mmHg  Pulse 97  Ht 5\' 9"  (1.753 m)  Wt 217 lb 9.6 oz (98.703 kg)  BMI 32.12 kg/m2  SpO2 95%  General - No distress ENT - No sinus tenderness, no oral exudate, no LAN, raspy voice Cardiac - s1s2 regular, no murmur Chest - No wheeze/rales/dullness Back - No focal tenderness Abd - Soft, non-tender Ext - No edema Neuro - Normal strength Skin - No rashes Psych - normal mood, and behavior   Assessment/Plan:  Obstructive sleep apnea. Plan: - will arrange for in lab sleep  study to assess current status of her sleep apnea, and then determine what set up she needs  COPD with chronic bronchitis. Plan: - will get chest xray and PFT's to further assess - continue albuterol/ipratropium nebulizer prn for now  - defer adding any other inhalers for now until CXR and PFT reviewed  Hoarseness. Likely related to smoking cigarettes. Plan: - f/u CXR, PFT  Tobacco abuse. Plan: - reviewed options to help with smoking cessation - she will like to try nicotine patch first  Obesity. Plan: - discussed importance of weight loss   , MD [facility] Pager:  [phone]

## 2014-11-28 NOTE — Patient Instructions (Signed)
Chest xray today Will arrange for sleep study in lab, and pulmonary function test Will arrange for new CPAP mask  Follow up in 6 to 8 weeks

## 2014-11-29 ENCOUNTER — Other Ambulatory Visit: Payer: Self-pay | Admitting: Pulmonary Disease

## 2014-11-29 ENCOUNTER — Telehealth: Payer: Self-pay | Admitting: Pulmonary Disease

## 2014-11-29 DIAGNOSIS — R911 Solitary pulmonary nodule: Secondary | ICD-10-CM

## 2014-11-29 NOTE — Telephone Encounter (Signed)
Dg Chest 2 View  11/28/2014   CLINICAL DATA:  Cough, congestion, shortness of breath for 3 weeks, smoking history  EXAM: CHEST  2 VIEW  COMPARISON:  Chest x-ray of 07/23/2013  FINDINGS: No active infiltrate or effusion is seen. However, better seen on the lateral view there is a vague nodular opacity anteriorly in the retrosternal airspace measuring approximately 15 mm. This could simply be due to bony and vascular overlap, but a developing pulmonary lesion cannot be excluded with this area not seen on the prior lateral chest x-ray. On the frontal view this nodular opacity could be in the periphery of the left upper lung field. CT of the chest is recommended without IV contrast to assess further in this patient with a smoking history. Mediastinal and hilar contours are unremarkable. The heart is within normal limits in size. No bony abnormality is seen.  IMPRESSION: Vague nodular opacity anteriorly in the retrosternal airspace on the lateral view possibly in the left upper lobe. Recommend CT of the chest in view of the patient's smoking history.   Electronically Signed   By: [name] M.D.   On: 11/28/2014 13:45    Results d/w pt.  Will arrange for CT chest with contrast.

## 2014-12-04 ENCOUNTER — Other Ambulatory Visit (INDEPENDENT_AMBULATORY_CARE_PROVIDER_SITE_OTHER): Payer: Medicaid Other

## 2014-12-04 LAB — BASIC METABOLIC PANEL
BUN: 12 mg/dL (ref 6–23)
CALCIUM: 9.3 mg/dL (ref 8.4–10.5)
CHLORIDE: 103 meq/L (ref 96–112)
CO2: 29 meq/L (ref 19–32)
Creatinine, Ser: 0.86 mg/dL (ref 0.40–1.20)
GFR: 87.32 mL/min (ref >60.00)
Glucose, Bld: 117 mg/dL — ABNORMAL HIGH (ref 70–99)
Potassium: 4.2 mEq/L (ref 3.5–5.1)
SODIUM: 141 meq/L (ref 135–145)

## 2014-12-06 ENCOUNTER — Ambulatory Visit (INDEPENDENT_AMBULATORY_CARE_PROVIDER_SITE_OTHER)
Admission: RE | Admit: 2014-12-06 | Discharge: 2014-12-06 | Disposition: A | Discharge status: Home / Self Care | Payer: Medicaid Other | Source: Ambulatory Visit | Attending: Pulmonary Disease | Admitting: Pulmonary Disease

## 2014-12-06 DIAGNOSIS — R911 Solitary pulmonary nodule: Secondary | ICD-10-CM

## 2014-12-06 MED ORDER — IOHEXOL 300 MG/ML  SOLN
80.0000 mL | Freq: Once | INTRAMUSCULAR | Status: AC | PRN
  Administered 2014-12-06: 80 mL via INTRAVENOUS

## 2014-12-07 ENCOUNTER — Encounter (HOSPITAL_COMMUNITY): Payer: Self-pay | Admitting: Emergency Medicine

## 2014-12-07 ENCOUNTER — Emergency Department (HOSPITAL_BASED_OUTPATIENT_CLINIC_OR_DEPARTMENT_OTHER): Payer: Medicaid Other

## 2014-12-07 ENCOUNTER — Emergency Department (HOSPITAL_COMMUNITY): Payer: Medicaid Other

## 2014-12-07 ENCOUNTER — Telehealth: Payer: Self-pay | Admitting: Pulmonary Disease

## 2014-12-07 ENCOUNTER — Emergency Department (HOSPITAL_COMMUNITY)
Admission: EM | Admit: 2014-12-07 | Discharge: 2014-12-07 | Disposition: A | Discharge status: Home / Self Care | Payer: Medicaid Other | Source: Home / Self Care | Attending: Emergency Medicine | Admitting: Emergency Medicine

## 2014-12-07 ENCOUNTER — Emergency Department (HOSPITAL_COMMUNITY)
Admission: EM | Admit: 2014-12-07 | Discharge: 2014-12-07 | Disposition: A | Discharge status: Home / Self Care | Payer: Medicaid Other | Attending: Emergency Medicine | Admitting: Emergency Medicine

## 2014-12-07 DIAGNOSIS — J449 Chronic obstructive pulmonary disease, unspecified: Secondary | ICD-10-CM | POA: Insufficient documentation

## 2014-12-07 DIAGNOSIS — Z9889 Other specified postprocedural states: Secondary | ICD-10-CM | POA: Insufficient documentation

## 2014-12-07 DIAGNOSIS — G4730 Sleep apnea, unspecified: Secondary | ICD-10-CM | POA: Insufficient documentation

## 2014-12-07 DIAGNOSIS — M79661 Pain in right lower leg: Secondary | ICD-10-CM | POA: Insufficient documentation

## 2014-12-07 DIAGNOSIS — E119 Type 2 diabetes mellitus without complications: Secondary | ICD-10-CM | POA: Diagnosis not present

## 2014-12-07 DIAGNOSIS — Z720 Tobacco use: Secondary | ICD-10-CM | POA: Insufficient documentation

## 2014-12-07 DIAGNOSIS — F329 Major depressive disorder, single episode, unspecified: Secondary | ICD-10-CM | POA: Diagnosis not present

## 2014-12-07 DIAGNOSIS — Z8719 Personal history of other diseases of the digestive system: Secondary | ICD-10-CM | POA: Diagnosis not present

## 2014-12-07 DIAGNOSIS — I10 Essential (primary) hypertension: Secondary | ICD-10-CM | POA: Diagnosis not present

## 2014-12-07 DIAGNOSIS — M1990 Unspecified osteoarthritis, unspecified site: Secondary | ICD-10-CM | POA: Diagnosis not present

## 2014-12-07 DIAGNOSIS — Z880 Allergy status to penicillin: Secondary | ICD-10-CM | POA: Diagnosis not present

## 2014-12-07 DIAGNOSIS — Z79899 Other long term (current) drug therapy: Secondary | ICD-10-CM | POA: Insufficient documentation

## 2014-12-07 DIAGNOSIS — G629 Polyneuropathy, unspecified: Secondary | ICD-10-CM | POA: Diagnosis not present

## 2014-12-07 DIAGNOSIS — R918 Other nonspecific abnormal finding of lung field: Secondary | ICD-10-CM

## 2014-12-07 DIAGNOSIS — Z780 Asymptomatic menopausal state: Secondary | ICD-10-CM | POA: Diagnosis not present

## 2014-12-07 DIAGNOSIS — E785 Hyperlipidemia, unspecified: Secondary | ICD-10-CM | POA: Diagnosis not present

## 2014-12-07 DIAGNOSIS — G8929 Other chronic pain: Secondary | ICD-10-CM | POA: Insufficient documentation

## 2014-12-07 DIAGNOSIS — M810 Age-related osteoporosis without current pathological fracture: Secondary | ICD-10-CM | POA: Insufficient documentation

## 2014-12-07 DIAGNOSIS — Z87828 Personal history of other (healed) physical injury and trauma: Secondary | ICD-10-CM | POA: Insufficient documentation

## 2014-12-07 DIAGNOSIS — R011 Cardiac murmur, unspecified: Secondary | ICD-10-CM | POA: Diagnosis not present

## 2014-12-07 DIAGNOSIS — F419 Anxiety disorder, unspecified: Secondary | ICD-10-CM | POA: Insufficient documentation

## 2014-12-07 DIAGNOSIS — Z8619 Personal history of other infectious and parasitic diseases: Secondary | ICD-10-CM | POA: Diagnosis not present

## 2014-12-07 LAB — CK: CK TOTAL: 136 U/L (ref 38–234)

## 2014-12-07 LAB — BASIC METABOLIC PANEL
Anion gap: 7 (ref 5–15)
BUN: 18 mg/dL (ref 6–20)
CALCIUM: 8.6 mg/dL — AB (ref 8.9–10.3)
CO2: 26 mmol/L (ref 22–32)
Chloride: 99 mmol/L — ABNORMAL LOW (ref 101–111)
Creatinine, Ser: 1.54 mg/dL — ABNORMAL HIGH (ref 0.44–1.00)
GFR calc non Af Amer: 36 mL/min — ABNORMAL LOW (ref >60)
GFR, EST AFRICAN AMERICAN: 42 mL/min — AB (ref >60)
Glucose, Bld: 117 mg/dL — ABNORMAL HIGH (ref 65–99)
Potassium: 3.8 mmol/L (ref 3.5–5.1)
SODIUM: 132 mmol/L — AB (ref 135–145)

## 2014-12-07 LAB — CBC WITH DIFFERENTIAL/PLATELET
BASOS PCT: 0 % (ref 0–1)
Basophils Absolute: 0.1 10*3/uL (ref 0.0–0.1)
EOS ABS: 0.2 10*3/uL (ref 0.0–0.7)
Eosinophils Relative: 1 % (ref 0–5)
HCT: 32.8 % — ABNORMAL LOW (ref 36.0–46.0)
HEMOGLOBIN: 11.1 g/dL — AB (ref 12.0–15.0)
Lymphocytes Relative: 21 % (ref 12–46)
Lymphs Abs: 2.6 10*3/uL (ref 0.7–4.0)
MCH: 29.5 pg (ref 26.0–34.0)
MCHC: 33.8 g/dL (ref 30.0–36.0)
MCV: 87.2 fL (ref 78.0–100.0)
Monocytes Absolute: 0.6 10*3/uL (ref 0.1–1.0)
Monocytes Relative: 5 % (ref 3–12)
NEUTROS PCT: 73 % (ref 43–77)
Neutro Abs: 9 10*3/uL — ABNORMAL HIGH (ref 1.7–7.7)
Platelets: 232 10*3/uL (ref 150–400)
RBC: 3.76 MIL/uL — ABNORMAL LOW (ref 3.87–5.11)
RDW: 13.8 % (ref 11.5–15.5)
WBC: 12.4 10*3/uL — ABNORMAL HIGH (ref 4.0–10.5)

## 2014-12-07 LAB — I-STAT CREATININE, ED: CREATININE: 1.2 mg/dL — AB (ref 0.44–1.00)

## 2014-12-07 MED ORDER — METHOCARBAMOL 500 MG PO TABS: 500.0000 mg | ORAL_TABLET | Freq: Two times a day (BID) | ORAL | Status: DC

## 2014-12-07 MED ORDER — OXYCODONE-ACETAMINOPHEN 5-325 MG PO TABS
1.0000 | ORAL_TABLET | Freq: Once | ORAL | Status: AC
  Administered 2014-12-07: 1 via ORAL
  Filled 2014-12-07: qty 1

## 2014-12-07 MED ORDER — HYDROMORPHONE HCL 1 MG/ML IJ SOLN
2.0000 mg | Freq: Once | INTRAMUSCULAR | Status: AC
  Administered 2014-12-07: 2 mg via INTRAMUSCULAR
  Filled 2014-12-07: qty 2

## 2014-12-07 MED ORDER — KETOROLAC TROMETHAMINE 60 MG/2ML IM SOLN
60.0000 mg | Freq: Once | INTRAMUSCULAR | Status: AC
  Administered 2014-12-07: 60 mg via INTRAMUSCULAR
  Filled 2014-12-07: qty 2

## 2014-12-07 NOTE — Progress Notes (Signed)
*  Preliminary Results* Right lower extremity venous duplex completed. Right lower extremity is negative for deep vein thrombosis. There is evidence of a complex right Baker's cyst.  12/07/2014 4:06 PM  Gertie Fey, RVT, RDCS, RDMS

## 2014-12-07 NOTE — ED Notes (Addendum)
Pt from home, seen earlier today in ED for leg pain and discharged. Pt states she went home and pain was still the same called her PCP with [facility] (Dr. Arsenio Loader) and was told to come back for admission for "chronic pain." pt states she has taken home muscle relaxers and pain medications with no relief. No other complaints.

## 2014-12-07 NOTE — ED Notes (Signed)
Pt from home for eval of right leg pain that started today, pt denies any injury. No swelling, no reddness, no warmth noted to pt leg. Pt states pain feels like a spasm that comes and goes. Pt states has taken 3 hydrocodone 5-325 mg tablets today already with no relief.

## 2014-12-07 NOTE — Discharge Instructions (Signed)
You do not have any evidence of blood clot in your right leg.  Follow up closely with your doctor for further evaluation of your condition.  Take muscle relaxant as needed.

## 2014-12-07 NOTE — Telephone Encounter (Signed)
Called and spoke to pt. Pt wants Dr. Craige Cotta to be aware of her recent ED visit for right calf pain. Pt aware a message will be given to [facility] about the ED visit.   Will send to Dr. Craige Cotta as Lorain Childes.

## 2014-12-07 NOTE — Telephone Encounter (Signed)
Called by Elnita Maxwell about excruciating R calf pain. Seen in ED earlier today and treated with Toradol and Robaxin without relief. Sent home with Rx for Robaxin. Patient has taken Valtaran Cream, Robaxin X 2 and Norco X 3 without relief. Patient instructed to return to ED to be re-evaluated.

## 2014-12-07 NOTE — ED Provider Notes (Signed)
CSN: [id number]     Arrival date & time 12/07/14  1337 History  This chart was scribed for non-physician practitioner, Fayrene Helper, PA-C, working with Lyndal Pulley, MD, by Ronney Lion, ED Scribe. This patient was seen in room TR08C/TR08C and the patient's care was started at 2:21 PM.    Chief Complaint  Patient presents with  . Leg Pain   The history is provided by the patient. No language interpreter was used.    HPI Comments: Vanessa [name] is a 59 y.o. female with an extensive PMHx (see below), who presents to the Emergency Department complaining of atraumatic, sudden-onset, severe, throbbing, right leg pain that began today without provocation when she was sitting in her chair. Patient states she originally thought it was a muscle spasm, so she took Vicodin and applied ice, both of which provided no relief. Patient states she has never had a pain like this before, although she notes she frequently gets muscle spasms in her calves. She also reports a known Baker's cyst on the posterior side of her right knee, and states she was also recenly evaluated for right fourth toe pain in the ED (10/26/14, per chart review). Patient states she had a CT scan yesterday for a "spot on [her] left lung" and was given dye, so she is concerned that the dye might be causing her pain. Patient is a current smoker. She denies recent travel, surgeries, immobilization, or estrogen therapies. She also denies hemoptysis, although she does note a chronic cough from COPD. Patient drove herself here today.  PCP: Dr. Lerry Liner  Past Medical History  Diagnosis Date  . Hypertension   . Heart murmur   . Asthma   . Emphysema   . Diabetes mellitus   . Hyperlipidemia   . Allergic rhinitis   . Chronic headache   . Neuropathy   . Grave's disease   . Herniated disc   . COPD (chronic obstructive pulmonary disease)   . Menopause   . Hernia, umbilical   . Anxiety   . Depression   . Back pain, chronic   . Shingles   .  Herniated disc   . Arthritis     rheumatoid  . 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 Generalized anxiety disorder 05/26/2012  . Chronic back pain 05/27/2012    Secondary to L4-5 herniated disc per patient  . OSA (obstructive sleep apnea)     has a cpap  . Wears glasses   . Wears dentures     top  . Tear of lateral meniscus of right knee 10/13/2013  . Alcoholism   . Sleep apnea     wear c-pap  . Allergy   . Emphysema of lung   . GERD (gastroesophageal reflux disease)   . Osteoporosis    Past Surgical History  Procedure Laterality Date  . Cholecystectomy    . Tubal ligation    . Foot surgery      cyst rt foot  . Laparoscopy      age 55  . Eye surgery      eye back in socker-rt  . Knee arthroscopy with lateral menisectomy Right 10/13/2013    Procedure: RIGHT KNEE ARTHROSCOPY WITH PARTIAL LATERAL MENISECTOMY/DEBRIDEMENT/SHAVING ;  Surgeon: Eulas Post, MD;  Location: [facility];  Service: Orthopedics;  Laterality: Right;  . Left heart catheterization with coronary angiogram N/A 05/30/2012    Procedure: LEFT HEART CATHETERIZATION WITH  CORONARY ANGIOGRAM;  Surgeon: [name], MD;  Location: [facility];  Service: Cardiovascular;  Laterality: N/A;   Family History  Problem Relation Age of Onset  . Emphysema Mother   . Allergies Mother   . Asthma Mother   . Heart disease Mother   . Rheum arthritis Mother   . Diabetes Mother   . Kidney disease Mother   . Allergies Daughter   . Asthma Brother   . Breast cancer Other     aunt  . Lung cancer Brother   . Throat cancer Brother   . Colon cancer Neg Hx   . Colon polyps Neg Hx   . Esophageal cancer Neg Hx   . Rectal cancer Neg Hx   . Stomach cancer Neg Hx    Social History  Substance Use Topics  . Smoking status: Current Every Day Smoker -- 1.00 packs/day for 40 years    Types: Cigarettes  . Smokeless tobacco: Never Used  . Alcohol Use:  No   OB History    Gravida Para Term Preterm AB TAB SAB Ectopic Multiple Living   Review of Systems  Respiratory: Positive for cough (chronic due to COPD).        Negative for hemoptysis  Musculoskeletal: Positive for myalgias (right leg pain).   Allergies  Ciprofloxacin; Fluocinolone; and Penicillins  Home Medications   Prior to Admission medications   Medication Sig Start Date End Date Taking? Authorizing Provider  acyclovir (ZOVIRAX) 400 MG tablet Take 800 mg by mouth every morning.     Historical Provider, MD  albuterol (PROAIR HFA) 108 (90 BASE) MCG/ACT inhaler Inhale 2 puffs into the lungs 4 (four) times daily. For COPD    Historical Provider, MD  atorvastatin (LIPITOR) 40 MG tablet Take 1 tablet (40 mg total) by mouth daily. 10/06/12   Gust Rung, DO  busPIRone (BUSPAR) 10 MG tablet Take 20 mg by mouth 3 (three) times daily.     Historical Provider, MD  cycloSPORINE (RESTASIS) 0.05 % ophthalmic emulsion Place 1 drop into both eyes 2 (two) times daily.    Historical Provider, MD  diclofenac sodium (VOLTAREN) 1 % GEL Apply 2 g topically 4 (four) times daily as needed (pain).     Historical Provider, MD  FLUoxetine (PROZAC) 20 MG capsule Take 60 mg by mouth every morning.     Historical Provider, MD  glipiZIDE (GLUCOTROL) 10 MG tablet Take 10 mg by mouth 2 (two) times daily before a meal.    Historical Provider, MD  hydrochlorothiazide (HYDRODIURIL) 25 MG tablet Take 25 mg by mouth every morning.     Historical Provider, MD  HYDROcodone-acetaminophen (NORCO) 10-325 MG per tablet Take 1 tablet by mouth every 6 (six) hours as needed for moderate pain.  02/05/14   Historical Provider, MD  ipratropium-albuterol (DUONEB) 0.5-2.5 (3) MG/3ML SOLN Take 3 mLs by nebulization every 6 (six) hours as needed (Asthma).    Historical Provider, MD  levothyroxine (SYNTHROID, LEVOTHROID) 175 MCG tablet Take 175 mcg by mouth every morning.     Historical Provider, MD  lisinopril  (PRINIVIL,ZESTRIL) 10 MG tablet Take 10 mg by mouth daily.    Historical Provider, MD  loratadine (CLARITIN) 10 MG tablet Take 10 mg by mouth daily as needed for allergies.    Historical Provider, MD  metFORMIN (GLUCOPHAGE) 1000 MG tablet Take 1,000 mg by mouth 2 (two) times daily.  Historical Provider, MD  mirtazapine (REMERON) 45 MG tablet Take 45 mg by mouth at bedtime as needed (Sleep). For insomnia    Historical Provider, MD  pregabalin (LYRICA) 25 MG capsule Take 75 mg by mouth 3 (three) times daily.     Historical Provider, MD  QUEtiapine (SEROQUEL) 25 MG tablet Take 50 mg by mouth at bedtime.     Historical Provider, MD  sennosides-docusate sodium (SENOKOT-S) 8.6-50 MG tablet Take 2 tablets by mouth daily. Patient taking differently: Take 2 tablets by mouth daily as needed for constipation.  10/13/13   Teryl Lucy, MD  traMADol (ULTRAM) 50 MG tablet Take 50 mg by mouth every 6 (six) hours as needed for moderate pain.  02/05/14   Historical Provider, MD  verapamil (CALAN) 40 MG tablet Take 40 mg by mouth 3 (three) times daily.    Historical Provider, MD   BP 102/53 mmHg  Pulse 89  Temp(Src) 98 F (36.7 C) (Oral)  Resp 16  Ht 5\' 9"  (1.753 m)  Wt 221 lb (100.245 kg)  BMI 32.62 kg/m2  SpO2 99% Physical Exam  Constitutional: She is oriented to person, place, and time. She appears well-developed and well-nourished. No distress.  HENT:  Head: Normocephalic and atraumatic.  Eyes: Conjunctivae and EOM are normal.  Neck: Neck supple. No tracheal deviation present.  Cardiovascular: Normal rate.   Pulmonary/Chest: Effort normal. No respiratory distress.  Musculoskeletal: Normal range of motion. She exhibits tenderness.  RLE: TTP along the lateral leg and along the calf, without any overlying skin changes. No edema noted. No palpable cords. No signs of cellulitis. Right knee with normal flexion and extension.   Neurological: She is alert and oriented to person, place, and time.  Skin:  Skin is warm and dry.  Psychiatric: She has a normal mood and affect. Her behavior is normal.  Nursing note and vitals reviewed.   ED Course  Procedures (including critical care time)  DIAGNOSTIC STUDIES: Oxygen Saturation is 99% on RA, normal by my interpretation.    COORDINATION OF CARE: 2:25 PM - Discussed treatment plan with pt at bedside which includes U/S to r/o possible DVT, given atraumatic onset of patient's pain and her history of smoking. Pt verbalized understanding and agreed to plan.   4:18 PM DVT study on pt's RLE showing no evidence of DVT.  Pt is NVI.  She has pain medications at home.  I recommend pt to f/u with her PCP for further evaluation.    Rosalie, [place] Female 05/10/1955 [id number]    Progress Notes by Gwendolyn Fill at 12/07/2014 4:06 PM    Author: Lawrence Marseilles Simonetti Service: Vascular Lab Author Type: Cardiovascular Sonographer   Filed: 12/07/2014 4:06 PM Note Time: 12/07/2014 4:06 PM Status: Signed   Editor: Lawrence Marseilles Simonetti (Cardiovascular Sonographer)     Expand All Collapse All   *Preliminary Results* Right lower extremity venous duplex completed. Right lower extremity is negative for deep vein thrombosis. There is evidence of a complex right Baker's cyst.  12/07/2014 4:06 PM  Gertie Fey, RVT, RDCS, RDMS            Labs Review Labs Reviewed  I-STAT CREATININE, ED - Abnormal; Notable for the following:    Creatinine, Ser 1.20 (*)    All other components within normal limits   Imaging Review Ct Chest W Contrast  12/06/2014   CLINICAL DATA:  Lung nodule on chest radiograph. Decreased chest pain and cough for 3 weeks. History of COPD.  EXAM: CT  CHEST WITH CONTRAST  TECHNIQUE: Multidetector CT imaging of the chest was performed during intravenous contrast administration.  CONTRAST:  94mL OMNIPAQUE IOHEXOL 300 MG/ML  SOLN  COMPARISON:  Radiograph 11/28/2014  FINDINGS: Mediastinum/Nodes: No axillary or supraclavicular  lymphadenopathy. No mediastinal hilar lymphadenopathy. No pericardial fluid.  Lungs/Pleura: There is a irregular nodule in the LEFT upper lobe which does have a a branching/segmental pattern suggesting involvement of airway. This nodule measures approximately 15 mm image 20, series 3 is seen more elongated on image 55, series 602.  At the RIGHT lung base there is focus of nodular consolidation measuring 15 mm image 41, series 3. There is nearby 3 mm nodule image 36. A second nearby nodule on image 80 also as a somewhat branching pattern. 2 mm nodule along the horizontal fissure (image 28, series 3)  Upper abdomen: Limited view of the liver, kidneys, pancreas are unremarkable. Normal adrenal glands.  Musculoskeletal: No aggressive osseous lesion.  Osseous lesion.  IMPRESSION: 1. Irregular branching nodule in the LEFT upper lobe corresponds to the lesion on comparison chest radiograph. Differential includes an early neoplasm versus a focus of infection. Recommend short-term follow-up CT without contrast to determine if lesion persists (1 month) and warrants further workup. 2. Several nodules in the RIGHT lower lobe are clustered in the same region and favored to represent infectious or inflammatory process. Recommend re-evaluation CT in 1 month as above.   Electronically Signed   By: Genevive Bi M.D.   On: 12/06/2014 11:08   I have personally reviewed and evaluated these images and lab results as part of my medical decision-making.  MDM   Final diagnoses:  Right calf pain   BP 102/53 mmHg  Pulse 89  Temp(Src) 98 F (36.7 C) (Oral)  Resp 16  Ht 5\' 9"  (1.753 m)  Wt 221 lb (100.245 kg)  BMI 32.62 kg/m2  SpO2 99%   I personally performed the services described in this documentation, which was scribed in my presence. The recorded information has been reviewed and is accurate.      , PA-C 12/07/14 1624  02/06/15, MD 12/08/14 [phone]

## 2014-12-07 NOTE — Telephone Encounter (Signed)
Pt calling. She wants the results of her CT scan done yesterday. Please advise Dr. Craige Cotta thanks

## 2014-12-07 NOTE — ED Provider Notes (Signed)
CSN: [id number]     Arrival date & time 12/07/14  2032 History  This chart was scribed for non-physician practitioner, Trixie Dredge, PA working with Alvira Monday, MD by Gwenyth Ober, ED scribe. This patient was seen in room TR10C/TR10C and the patient's care was started at 8:58 PM   Chief Complaint  Patient presents with  . Leg Pain   The history is provided by the patient and medical records.   HPI Comments: Vanessa [name] is a 59 y.o. female with a history of HTN, emphysema, DM, neuropathy, COPD, a herniated disc and chronic back pain who presents to the Emergency Department complaining of constant, acute onset, moderate right calf pain that started 5.5 hours ago while she was sitting in a chair. Pt states light-headedness secondary to the pain as an associated symptom. She also notes 4th right toe pain that started a few weeks ago after she stubbed it on the floor. Pt reports leg pain becomes worse with palpation. Pt was seen in the ED earlier today for the same. She had an Venous duplex US of her right leg which was negative for DVT, was given Percocet and Toradol-60 mg IM, and was discharged home. Pt has taken 2 Vicodin and 2 Robaxin within the last 5 hours with no relief. She has also tried applying Voltaren gel, ice and a knee brace with no improvement in her symptoms. Pt followed up with her PCP who advised her to return for re-evaluation of continuing pain. She is currently being followed by Dr. Craige Cotta for COPD and for new abnormal Chest CT (Scan 12/06/14). Pt also receives cortisone injections for chronic knee pain but not recently. Pt denies weakness, numbness and tingling of her right foot, CP, fever, worse-than-baseline SOB and worse-than-baseline back pain.   Past Medical History  Diagnosis Date  . Hypertension   . Heart murmur   . Asthma   . Emphysema   . Diabetes mellitus   . Hyperlipidemia   . Allergic rhinitis   . Chronic headache   . Neuropathy   . Grave's disease   .  Herniated disc   . COPD (chronic obstructive pulmonary disease)   . Menopause   . Hernia, umbilical   . Anxiety   . Depression   . Back pain, chronic   . Shingles   . Herniated disc   . Arthritis     rheumatoid  . 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 Generalized anxiety disorder 05/26/2012  . Chronic back pain 05/27/2012    Secondary to L4-5 herniated disc per patient  . OSA (obstructive sleep apnea)     has a cpap  . Wears glasses   . Wears dentures     top  . Tear of lateral meniscus of right knee 10/13/2013  . Alcoholism   . Sleep apnea     wear c-pap  . Allergy   . Emphysema of lung   . GERD (gastroesophageal reflux disease)   . Osteoporosis    Past Surgical History  Procedure Laterality Date  . Cholecystectomy    . Tubal ligation    . Foot surgery      cyst rt foot  . Laparoscopy      age 34  . Eye surgery      eye back in socker-rt  . Knee arthroscopy with lateral menisectomy Right 10/13/2013    Procedure: RIGHT KNEE ARTHROSCOPY WITH PARTIAL LATERAL MENISECTOMY/DEBRIDEMENT/SHAVING ;  Surgeon:  Eulas Post, MD;  Location: [facility];  Service: Orthopedics;  Laterality: Right;  . Left heart catheterization with coronary angiogram N/A 05/30/2012    Procedure: LEFT HEART CATHETERIZATION WITH CORONARY ANGIOGRAM;  Surgeon: [name], MD;  Location: [facility] CATH LAB;  Service: Cardiovascular;  Laterality: N/A;   Family History  Problem Relation Age of Onset  . Emphysema Mother   . Allergies Mother   . Asthma Mother   . Heart disease Mother   . Rheum arthritis Mother   . Diabetes Mother   . Kidney disease Mother   . Allergies Daughter   . Asthma Brother   . Breast cancer Other     aunt  . Lung cancer Brother   . Throat cancer Brother   . Colon cancer Neg Hx   . Colon polyps Neg Hx   . Esophageal cancer Neg Hx   . Rectal cancer Neg Hx   . Stomach cancer Neg Hx    Social  History  Substance Use Topics  . Smoking status: Current Every Day Smoker -- 1.00 packs/day for 40 years    Types: Cigarettes  . Smokeless tobacco: Never Used  . Alcohol Use: No   OB History    Gravida Para Term Preterm AB TAB SAB Ectopic Multiple Living   5 3   2  2   3      Review of Systems  Constitutional: Negative for fever and chills.  Respiratory: Negative for shortness of breath.   Cardiovascular: Negative for chest pain.  Gastrointestinal: Negative for vomiting, abdominal pain and diarrhea.  Genitourinary: Negative for dysuria.  Musculoskeletal: Negative for back pain.  Skin: Negative for color change, rash and wound.  Allergic/Immunologic: Positive for immunocompromised state.  Neurological: Negative for weakness and numbness.  Hematological: Does not bruise/bleed easily.  Psychiatric/Behavioral: Negative for self-injury.      Allergies  Ciprofloxacin; Fluocinolone; and Penicillins  Home Medications   Prior to Admission medications   Medication Sig Start Date End Date Taking? Authorizing Provider  acyclovir (ZOVIRAX) 400 MG tablet Take 800 mg by mouth every morning.     Historical Provider, MD  albuterol (PROAIR HFA) 108 (90 BASE) MCG/ACT inhaler Inhale 2 puffs into the lungs 4 (four) times daily. For COPD    Historical Provider, MD  atorvastatin (LIPITOR) 40 MG tablet Take 1 tablet (40 mg total) by mouth daily. 10/06/12   12/07/12, DO  busPIRone (BUSPAR) 10 MG tablet Take 20 mg by mouth 3 (three) times daily.     Historical Provider, MD  cycloSPORINE (RESTASIS) 0.05 % ophthalmic emulsion Place 1 drop into both eyes 2 (two) times daily.    Historical Provider, MD  diclofenac sodium (VOLTAREN) 1 % GEL Apply 2 g topically 4 (four) times daily as needed (pain).     Historical Provider, MD  FLUoxetine (PROZAC) 20 MG capsule Take 60 mg by mouth every morning.     Historical Provider, MD  glipiZIDE (GLUCOTROL) 10 MG tablet Take 10 mg by mouth 2 (two) times daily  before a meal.    Historical Provider, MD  hydrochlorothiazide (HYDRODIURIL) 25 MG tablet Take 25 mg by mouth every morning.     Historical Provider, MD  HYDROcodone-acetaminophen (NORCO) 10-325 MG per tablet Take 1 tablet by mouth every 6 (six) hours as needed for moderate pain.  02/05/14   Historical Provider, MD  ipratropium-albuterol (DUONEB) 0.5-2.5 (3) MG/3ML SOLN Take 3 mLs by nebulization every 6 (six) hours as needed (Asthma).  Historical Provider, MD  levothyroxine (SYNTHROID, LEVOTHROID) 175 MCG tablet Take 175 mcg by mouth every morning.     Historical Provider, MD  lisinopril (PRINIVIL,ZESTRIL) 10 MG tablet Take 10 mg by mouth daily.    Historical Provider, MD  loratadine (CLARITIN) 10 MG tablet Take 10 mg by mouth daily as needed for allergies.    Historical Provider, MD  metFORMIN (GLUCOPHAGE) 1000 MG tablet Take 1,000 mg by mouth 2 (two) times daily.     Historical Provider, MD  methocarbamol (ROBAXIN) 500 MG tablet Take 1 tablet (500 mg total) by mouth 2 (two) times daily. 12/07/14   Fayrene Helper, PA-C  mirtazapine (REMERON) 45 MG tablet Take 45 mg by mouth at bedtime as needed (Sleep). For insomnia    Historical Provider, MD  pregabalin (LYRICA) 25 MG capsule Take 75 mg by mouth 3 (three) times daily.     Historical Provider, MD  QUEtiapine (SEROQUEL) 25 MG tablet Take 50 mg by mouth at bedtime.     Historical Provider, MD  sennosides-docusate sodium (SENOKOT-S) 8.6-50 MG tablet Take 2 tablets by mouth daily. Patient taking differently: Take 2 tablets by mouth daily as needed for constipation.  10/13/13   Teryl Lucy, MD  traMADol (ULTRAM) 50 MG tablet Take 50 mg by mouth every 6 (six) hours as needed for moderate pain.  02/05/14   Historical Provider, MD  verapamil (CALAN) 40 MG tablet Take 40 mg by mouth 3 (three) times daily.    Historical Provider, MD   BP 108/64 mmHg  Pulse 88  Temp(Src) 98.2 F (36.8 C) (Oral)  Resp 16  Ht 5\' 9"  (1.753 m)  Wt 221 lb (100.245 kg)  BMI  32.62 kg/m2  SpO2 96% Physical Exam  Constitutional: She appears well-developed and well-nourished. No distress.  HENT:  Head: Normocephalic and atraumatic.  Neck: Neck supple.  Cardiovascular: Normal rate and regular rhythm.   Pulmonary/Chest: Effort normal and breath sounds normal. No respiratory distress. She has no wheezes. She has no rales.  Abdominal: Soft. She exhibits no distension. There is no tenderness. There is no rebound and no guarding.  Musculoskeletal: She exhibits tenderness.  Tenderness to the right calf No erythema or warmth Compartments are soft Right DP/PT pulses intact No bony tenderness  Spine nontender, no crepitus, or stepoffs. Lower extremities:  Strength 5/5, sensation intact, distal pulses intact.     Neurological: She is alert.  Skin: She is not diaphoretic.  Nursing note and vitals reviewed.   ED Course  Procedures   DIAGNOSTIC STUDIES: Oxygen Saturation is 96% on RA, normal by my interpretation.    COORDINATION OF CARE: 9:15 PM Discussed treatment plan with pt at bedside and pt agreed to plan.  Labs Review Labs Reviewed  BASIC METABOLIC PANEL - Abnormal; Notable for the following:    Sodium 132 (*)    Chloride 99 (*)    Glucose, Bld 117 (*)    Creatinine, Ser 1.54 (*)    Calcium 8.6 (*)    GFR calc non Af Amer 36 (*)    GFR calc Af Amer 42 (*)    All other components within normal limits  CBC WITH DIFFERENTIAL/PLATELET - Abnormal; Notable for the following:    WBC 12.4 (*)    RBC 3.76 (*)    Hemoglobin 11.1 (*)    HCT 32.8 (*)    Neutro Abs 9.0 (*)    All other components within normal limits  CK    Imaging Review Dg Tibia/fibula Right  12/07/2014   CLINICAL DATA:  constant, acute onset, moderate right calf pain that started 5.5 hours ago while she was sitting in a chair.  EXAM: RIGHT TIBIA AND FIBULA - 2 VIEW  COMPARISON:  None.  FINDINGS: There is no evidence of fracture or other focal bone lesions. Soft tissues are unremarkable.   IMPRESSION: Negative.   Electronically Signed   By: Burman Nieves M.D.   On: 12/07/2014 22:08   Ct Chest W Contrast  12/06/2014   CLINICAL DATA:  Lung nodule on chest radiograph. Decreased chest pain and cough for 3 weeks. History of COPD.  EXAM: CT CHEST WITH CONTRAST  TECHNIQUE: Multidetector CT imaging of the chest was performed during intravenous contrast administration.  CONTRAST:  55mL OMNIPAQUE IOHEXOL 300 MG/ML  SOLN  COMPARISON:  Radiograph 11/28/2014  FINDINGS: Mediastinum/Nodes: No axillary or supraclavicular lymphadenopathy. No mediastinal hilar lymphadenopathy. No pericardial fluid.  Lungs/Pleura: There is a irregular nodule in the LEFT upper lobe which does have a a branching/segmental pattern suggesting involvement of airway. This nodule measures approximately 15 mm image 20, series 3 is seen more elongated on image 55, series 602.  At the RIGHT lung base there is focus of nodular consolidation measuring 15 mm image 41, series 3. There is nearby 3 mm nodule image 36. A second nearby nodule on image 80 also as a somewhat branching pattern. 2 mm nodule along the horizontal fissure (image 28, series 3)  Upper abdomen: Limited view of the liver, kidneys, pancreas are unremarkable. Normal adrenal glands.  Musculoskeletal: No aggressive osseous lesion.  Osseous lesion.  IMPRESSION: 1. Irregular branching nodule in the LEFT upper lobe corresponds to the lesion on comparison chest radiograph. Differential includes an early neoplasm versus a focus of infection. Recommend short-term follow-up CT without contrast to determine if lesion persists (1 month) and warrants further workup. 2. Several nodules in the RIGHT lower lobe are clustered in the same region and favored to represent infectious or inflammatory process. Recommend re-evaluation CT in 1 month as above.   Electronically Signed   By: Genevive Bi M.D.   On: 12/06/2014 11:08   Dg Foot Complete Right  12/07/2014   CLINICAL DATA:  Constant  acute onset moderate right calf pain.  EXAM: RIGHT FOOT COMPLETE - 3+ VIEW  COMPARISON:  10/26/2014  FINDINGS: There is a fracture deformity involving the distal phalanx of the fourth toe. There is medial displacement of the distal fracture fragments. The fracture appears subacute to chronic. There is overlying soft tissue swelling.  IMPRESSION: 1. Age-indeterminate fracture involves the distal phalanx of the fourth toe.   Electronically Signed   By: Signa Kell M.D.   On: 12/07/2014 22:10     EKG Interpretation None      9:50 PM  Discussed pt with Dr Dalene Seltzer who will also see and evalute the patient.    11:24 PM Upon re-evaluation, pt's pain is much improved, but not completely relieved.   11:38 PM Pt is ready to go home. Pt states her pain is improved and she is able to walk around, states she would prefer to go home and go to sleep, plans to follow up with [facility] tomorrow.     MDM   Final diagnoses:  Pain of right lower leg     Afebrile nontoxic pain with right calf pain that began suddenly today without trauma.  Seen in ED with negative DVT study.  Had uncontrolled pain and returned for reevalution.  Right leg is  neurovascularly intact.  Compartments are soft.  Pain is reproducible with palpation. Muscular vs possible radicular pain, though pt denies any worsening of chronic low back pain.  Clinically no e/o infection.  Labs unremarkable, pt does have leukocytosis but this has been ongoing.  Xrays show remote fracture of right 4th toe.   Pt given IM dilaudid for pain control.  Pt also seen by Dr Dalene Seltzer.  Please see her note for further detail.  Pt d/c home with PCP, ortho follow up, given return precautions.  Pt feeling much better upon discharge.  Discussed result, findings, treatment, and follow up  with patient.  Pt given return precautions.  Pt verbalizes understanding and agrees with plan.        I personally performed the services described in this  documentation, which was scribed in my presence. The recorded information has been reviewed and is accurate.    Trixie Dredge, PA-C 12/08/14 0030  Alvira Monday, MD 12/11/14 2031

## 2014-12-07 NOTE — Discharge Instructions (Signed)
Read the information below.  You may return to the Emergency Department at any time for worsening condition or any new symptoms that concern you.  If you develop uncontrolled pain, weakness or numbness of the extremity, severe discoloration of the skin, or you are unable to walk or move your toes, return to the ER for a recheck.      Musculoskeletal Pain Musculoskeletal pain is muscle and boney aches and pains. These pains can occur in any part of the body. Your caregiver may treat you without knowing the cause of the pain. They may treat you if blood or urine tests, X-rays, and other tests were normal.  CAUSES There is often not a definite cause or reason for these pains. These pains may be caused by a type of germ (virus). The discomfort may also come from overuse. Overuse includes working out too hard when your body is not fit. Boney aches also come from weather changes. Bone is sensitive to atmospheric pressure changes. HOME CARE INSTRUCTIONS   Ask when your test results will be ready. Make sure you get your test results.  Only take over-the-counter or prescription medicines for pain, discomfort, or fever as directed by your caregiver. If you were given medications for your condition, do not drive, operate machinery or power tools, or sign legal documents for 24 hours. Do not drink alcohol. Do not take sleeping pills or other medications that may interfere with treatment.  Continue all activities unless the activities cause more pain. When the pain lessens, slowly resume normal activities. Gradually increase the intensity and duration of the activities or exercise.  During periods of severe pain, bed rest may be helpful. Lay or sit in any position that is comfortable.  Putting ice on the injured area.  Put ice in a bag.  Place a towel between your skin and the bag.  Leave the ice on for 15 to 20 minutes, 3 to 4 times a day.  Follow up with your caregiver for continued problems and no  reason can be found for the pain. If the pain becomes worse or does not go away, it may be necessary to repeat tests or do additional testing. Your caregiver may need to look further for a possible cause. SEEK IMMEDIATE MEDICAL CARE IF:  You have pain that is getting worse and is not relieved by medications.  You develop chest pain that is associated with shortness or breath, sweating, feeling sick to your stomach (nauseous), or throw up (vomit).  Your pain becomes localized to the abdomen.  You develop any new symptoms that seem different or that concern you. MAKE SURE YOU:   Understand these instructions.  Will watch your condition.  Will get help right away if you are not doing well or get worse. Document Released: 03/16/2005 Document Revised: 06/08/2011 Document Reviewed: 11/18/2012 [facility] Patient Information 2015 [place], [place]. This information is not intended to replace advice given to you by your health care provider. Make sure you discuss any questions you have with your health care provider.  Pain of Unknown Etiology (Pain Without a Known Cause) You have come to your caregiver because of pain. Pain can occur in any part of the body. Often there is not a definite cause. If your laboratory (blood or urine) work was normal and X-rays or other studies were normal, your caregiver may treat you without knowing the cause of the pain. An example of this is the headache. Most headaches are diagnosed by taking a history. This  means your caregiver asks you questions about your headaches. Your caregiver determines a treatment based on your answers. Usually testing done for headaches is normal. Often testing is not done unless there is no response to medications. Regardless of where your pain is located today, you can be given medications to make you comfortable. If no physical cause of pain can be found, most cases of pain will gradually leave as suddenly as they came.  If you have a painful  condition and no reason can be found for the pain, it is important that you follow up with your caregiver. If the pain becomes worse or does not go away, it may be necessary to repeat tests and look further for a possible cause.  Only take over-the-counter or prescription medicines for pain, discomfort, or fever as directed by your caregiver.  For the protection of your privacy, test results cannot be given over the phone. Make sure you receive the results of your test. Ask how these results are to be obtained if you have not been informed. It is your responsibility to obtain your test results.  You may continue all activities unless the activities cause more pain. When the pain lessens, it is important to gradually resume normal activities. Resume activities by beginning slowly and gradually increasing the intensity and duration of the activities or exercise. During periods of severe pain, bed rest may be helpful. Lie or sit in any position that is comfortable.  Ice used for acute (sudden) conditions may be effective. Use a large plastic bag filled with ice and wrapped in a towel. This may provide pain relief.  See your caregiver for continued problems. Your caregiver can help or refer you for exercises or physical therapy if necessary. If you were given medications for your condition, do not drive, operate machinery or power tools, or sign legal documents for 24 hours. Do not drink alcohol, take sleeping pills, or take other medications that may interfere with treatment. See your caregiver immediately if you have pain that is becoming worse and not relieved by medications. Document Released: 12/09/2000 Document Revised: 01/04/2013 Document Reviewed: 03/16/2005 [facility] Patient Information 2015 [place], [place]. This information is not intended to replace advice given to you by your health care provider. Make sure you discuss any questions you have with your health care provider.

## 2014-12-07 NOTE — Telephone Encounter (Signed)
CT chest 12/06/14 >> 1.5 cm irregular nodular lesion LUL, 1.5 mm nodule Rt lung base, 3 mm nodule Rt lung base, 2 mm nodule Rt mid lung region  Results d/w patient.  Will plan to get f/u non contrast CT chest in 6 weeks.

## 2014-12-08 ENCOUNTER — Encounter (HOSPITAL_COMMUNITY): Payer: Self-pay | Admitting: Emergency Medicine

## 2014-12-08 ENCOUNTER — Emergency Department (HOSPITAL_COMMUNITY)
Admission: EM | Admit: 2014-12-08 | Discharge: 2014-12-08 | Disposition: A | Discharge status: Home / Self Care | Payer: Medicaid Other | Attending: Emergency Medicine | Admitting: Emergency Medicine

## 2014-12-08 DIAGNOSIS — F329 Major depressive disorder, single episode, unspecified: Secondary | ICD-10-CM | POA: Insufficient documentation

## 2014-12-08 DIAGNOSIS — Z9981 Dependence on supplemental oxygen: Secondary | ICD-10-CM | POA: Insufficient documentation

## 2014-12-08 DIAGNOSIS — Z8781 Personal history of (healed) traumatic fracture: Secondary | ICD-10-CM | POA: Diagnosis not present

## 2014-12-08 DIAGNOSIS — Z87828 Personal history of other (healed) physical injury and trauma: Secondary | ICD-10-CM | POA: Diagnosis not present

## 2014-12-08 DIAGNOSIS — Z8619 Personal history of other infectious and parasitic diseases: Secondary | ICD-10-CM | POA: Insufficient documentation

## 2014-12-08 DIAGNOSIS — M810 Age-related osteoporosis without current pathological fracture: Secondary | ICD-10-CM | POA: Diagnosis not present

## 2014-12-08 DIAGNOSIS — M79661 Pain in right lower leg: Secondary | ICD-10-CM

## 2014-12-08 DIAGNOSIS — E785 Hyperlipidemia, unspecified: Secondary | ICD-10-CM | POA: Diagnosis not present

## 2014-12-08 DIAGNOSIS — E119 Type 2 diabetes mellitus without complications: Secondary | ICD-10-CM | POA: Insufficient documentation

## 2014-12-08 DIAGNOSIS — G8929 Other chronic pain: Secondary | ICD-10-CM | POA: Diagnosis not present

## 2014-12-08 DIAGNOSIS — M069 Rheumatoid arthritis, unspecified: Secondary | ICD-10-CM | POA: Insufficient documentation

## 2014-12-08 DIAGNOSIS — G4733 Obstructive sleep apnea (adult) (pediatric): Secondary | ICD-10-CM | POA: Insufficient documentation

## 2014-12-08 DIAGNOSIS — Z720 Tobacco use: Secondary | ICD-10-CM | POA: Diagnosis not present

## 2014-12-08 DIAGNOSIS — Z880 Allergy status to penicillin: Secondary | ICD-10-CM | POA: Diagnosis not present

## 2014-12-08 DIAGNOSIS — J449 Chronic obstructive pulmonary disease, unspecified: Secondary | ICD-10-CM | POA: Insufficient documentation

## 2014-12-08 DIAGNOSIS — Z79899 Other long term (current) drug therapy: Secondary | ICD-10-CM | POA: Insufficient documentation

## 2014-12-08 DIAGNOSIS — Z8719 Personal history of other diseases of the digestive system: Secondary | ICD-10-CM | POA: Diagnosis not present

## 2014-12-08 DIAGNOSIS — F419 Anxiety disorder, unspecified: Secondary | ICD-10-CM | POA: Insufficient documentation

## 2014-12-08 DIAGNOSIS — Z9889 Other specified postprocedural states: Secondary | ICD-10-CM | POA: Insufficient documentation

## 2014-12-08 DIAGNOSIS — R011 Cardiac murmur, unspecified: Secondary | ICD-10-CM | POA: Diagnosis not present

## 2014-12-08 DIAGNOSIS — I10 Essential (primary) hypertension: Secondary | ICD-10-CM | POA: Insufficient documentation

## 2014-12-08 LAB — CBC WITH DIFFERENTIAL/PLATELET
Basophils Absolute: 0 10*3/uL (ref 0.0–0.1)
Basophils Relative: 0 % (ref 0–1)
Eosinophils Absolute: 0.1 10*3/uL (ref 0.0–0.7)
Eosinophils Relative: 1 % (ref 0–5)
HCT: 35.1 % — ABNORMAL LOW (ref 36.0–46.0)
Hemoglobin: 11.5 g/dL — ABNORMAL LOW (ref 12.0–15.0)
LYMPHS ABS: 1.8 10*3/uL (ref 0.7–4.0)
LYMPHS PCT: 17 % (ref 12–46)
MCH: 28.8 pg (ref 26.0–34.0)
MCHC: 32.8 g/dL (ref 30.0–36.0)
MCV: 88 fL (ref 78.0–100.0)
MONO ABS: 0.6 10*3/uL (ref 0.1–1.0)
Monocytes Relative: 6 % (ref 3–12)
Neutro Abs: 8.3 10*3/uL — ABNORMAL HIGH (ref 1.7–7.7)
Neutrophils Relative %: 76 % (ref 43–77)
Platelets: 259 10*3/uL (ref 150–400)
RBC: 3.99 MIL/uL (ref 3.87–5.11)
RDW: 13.9 % (ref 11.5–15.5)
WBC: 10.9 10*3/uL — ABNORMAL HIGH (ref 4.0–10.5)

## 2014-12-08 LAB — BASIC METABOLIC PANEL
ANION GAP: 5 (ref 5–15)
BUN: 17 mg/dL (ref 6–20)
CHLORIDE: 101 mmol/L (ref 101–111)
CO2: 32 mmol/L (ref 22–32)
Calcium: 9.3 mg/dL (ref 8.9–10.3)
Creatinine, Ser: 1.18 mg/dL — ABNORMAL HIGH (ref 0.44–1.00)
GFR calc Af Amer: 58 mL/min — ABNORMAL LOW (ref >60)
GFR calc non Af Amer: 50 mL/min — ABNORMAL LOW (ref >60)
GLUCOSE: 90 mg/dL (ref 65–99)
POTASSIUM: 4.3 mmol/L (ref 3.5–5.1)
Sodium: 138 mmol/L (ref 135–145)

## 2014-12-08 MED ORDER — DEXAMETHASONE SODIUM PHOSPHATE 10 MG/ML IJ SOLN
10.0000 mg | Freq: Once | INTRAMUSCULAR | Status: AC
  Administered 2014-12-08: 10 mg via INTRAMUSCULAR
  Filled 2014-12-08: qty 1

## 2014-12-08 MED ORDER — GABAPENTIN 100 MG PO CAPS: 100.0000 mg | ORAL_CAPSULE | Freq: Three times a day (TID) | ORAL | Status: DC

## 2014-12-08 MED ORDER — OXYCODONE-ACETAMINOPHEN 5-325 MG PO TABS
2.0000 | ORAL_TABLET | Freq: Once | ORAL | Status: AC
  Administered 2014-12-08: 2 via ORAL
  Filled 2014-12-08: qty 2

## 2014-12-08 NOTE — ED Provider Notes (Signed)
CSN: [id number]     Arrival date & time 12/08/14  1529 History   First MD Initiated Contact with Patient 12/08/14 1626     Chief Complaint  Patient presents with  . Leg Pain     (Consider location/radiation/quality/duration/timing/severity/associated sxs/prior Treatment) The history is provided by the patient and medical records.     Pt with hx DM, neuropathy, chronic back pain returns to ED for 3rd visit in two days for right lower extremity pain.  The pain began suddenly while sitting in a chair yesterday. She was seen in ED twice yesterday for right calf pain, ruled out for DVT, negative Tib/fib xrays.  She was found to have remote fracture to right 4th toe.  Pt states the pain began again this morning around 8am.  She called her PCP (Dr Lerry Liner) and was advised to come back to ED for repeat DVT study.  Pt states the pain involves her right lateral lower leg radiates into her 4th toe, and also up behind her knee and into her thigh.  Denies any other symptoms including weakness and numbness of the leg, fevers, any change in her baseline medical problems.   Today she has taken one vicodin, this morning when she woke up, and then one robaxin this afternoon.  She has tried no other treatments.   Past Medical History  Diagnosis Date  . Hypertension   . Heart murmur   . Asthma   . Emphysema   . Diabetes mellitus   . Hyperlipidemia   . Allergic rhinitis   . Chronic headache   . Neuropathy   . Grave's disease   . Herniated disc   . COPD (chronic obstructive pulmonary disease)   . Menopause   . Hernia, umbilical   . Anxiety   . Depression   . Back pain, chronic   . Shingles   . Herniated disc   . Arthritis     rheumatoid  . 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 Generalized anxiety disorder 05/26/2012  . Chronic back pain 05/27/2012    Secondary to L4-5 herniated disc per patient  . OSA (obstructive sleep apnea)      has a cpap  . Wears glasses   . Wears dentures     top  . Tear of lateral meniscus of right knee 10/13/2013  . Alcoholism   . Sleep apnea     wear c-pap  . Allergy   . Emphysema of lung   . GERD (gastroesophageal reflux disease)   . Osteoporosis    Past Surgical History  Procedure Laterality Date  . Cholecystectomy    . Tubal ligation    . Foot surgery      cyst rt foot  . Laparoscopy      age 60  . Eye surgery      eye back in socker-rt  . Knee arthroscopy with lateral menisectomy Right 10/13/2013    Procedure: RIGHT KNEE ARTHROSCOPY WITH PARTIAL LATERAL MENISECTOMY/DEBRIDEMENT/SHAVING ;  Surgeon: Eulas Post, MD;  Location: [facility];  Service: Orthopedics;  Laterality: Right;  . Left heart catheterization with coronary angiogram N/A 05/30/2012    Procedure: LEFT HEART CATHETERIZATION WITH CORONARY ANGIOGRAM;  Surgeon: [name], MD;  Location: [facility] CATH LAB;  Service: Cardiovascular;  Laterality: N/A;   Family History  Problem Relation Age of Onset  . Emphysema Mother   . Allergies Mother   . Asthma  Mother   . Heart disease Mother   . Rheum arthritis Mother   . Diabetes Mother   . Kidney disease Mother   . Allergies Daughter   . Asthma Brother   . Breast cancer Other     aunt  . Lung cancer Brother   . Throat cancer Brother   . Colon cancer Neg Hx   . Colon polyps Neg Hx   . Esophageal cancer Neg Hx   . Rectal cancer Neg Hx   . Stomach cancer Neg Hx    Social History  Substance Use Topics  . Smoking status: Current Every Day Smoker -- 1.00 packs/day for 40 years    Types: Cigarettes  . Smokeless tobacco: Never Used  . Alcohol Use: No   OB History    Gravida Para Term Preterm AB TAB SAB Ectopic Multiple Living   Review of Systems  Constitutional: Negative for fever and chills.  Cardiovascular: Negative for leg swelling.  Skin: Negative for color change and rash.  Neurological: Negative for weakness and  numbness.  Psychiatric/Behavioral: Negative for self-injury.  All other systems reviewed and are negative.     Allergies  Ciprofloxacin; Fluocinolone; and Penicillins  Home Medications   Prior to Admission medications   Medication Sig Start Date End Date Taking? Authorizing Provider  acyclovir (ZOVIRAX) 400 MG tablet Take 800 mg by mouth every morning.     Historical Provider, MD  albuterol (PROAIR HFA) 108 (90 BASE) MCG/ACT inhaler Inhale 2 puffs into the lungs 4 (four) times daily. For COPD    Historical Provider, MD  atorvastatin (LIPITOR) 40 MG tablet Take 1 tablet (40 mg total) by mouth daily. 10/06/12   Gust Rung, DO  busPIRone (BUSPAR) 10 MG tablet Take 20 mg by mouth 3 (three) times daily.     Historical Provider, MD  cycloSPORINE (RESTASIS) 0.05 % ophthalmic emulsion Place 1 drop into both eyes 2 (two) times daily.    Historical Provider, MD  diclofenac sodium (VOLTAREN) 1 % GEL Apply 2 g topically 4 (four) times daily as needed (pain).     Historical Provider, MD  FLUoxetine (PROZAC) 20 MG capsule Take 60 mg by mouth every morning.     Historical Provider, MD  glipiZIDE (GLUCOTROL) 10 MG tablet Take 10 mg by mouth 2 (two) times daily before a meal.    Historical Provider, MD  hydrochlorothiazide (HYDRODIURIL) 25 MG tablet Take 25 mg by mouth every morning.     Historical Provider, MD  HYDROcodone-acetaminophen (NORCO) 10-325 MG per tablet Take 1 tablet by mouth every 6 (six) hours as needed for moderate pain.  02/05/14   Historical Provider, MD  ipratropium-albuterol (DUONEB) 0.5-2.5 (3) MG/3ML SOLN Take 3 mLs by nebulization every 6 (six) hours as needed (Asthma).    Historical Provider, MD  levothyroxine (SYNTHROID, LEVOTHROID) 175 MCG tablet Take 175 mcg by mouth every morning.     Historical Provider, MD  lisinopril (PRINIVIL,ZESTRIL) 10 MG tablet Take 10 mg by mouth daily.    Historical Provider, MD  loratadine (CLARITIN) 10 MG tablet Take 10 mg by mouth daily as needed  for allergies.    Historical Provider, MD  metFORMIN (GLUCOPHAGE) 1000 MG tablet Take 1,000 mg by mouth 2 (two) times daily.     Historical Provider, MD  methocarbamol (ROBAXIN) 500 MG tablet Take 1 tablet (500 mg total) by mouth 2 (two) times daily. 12/07/14   Greta Doom  Laveda Norman, PA-C  mirtazapine (REMERON) 45 MG tablet Take 45 mg by mouth at bedtime as needed (Sleep). For insomnia    Historical Provider, MD  pregabalin (LYRICA) 25 MG capsule Take 75 mg by mouth 3 (three) times daily.     Historical Provider, MD  QUEtiapine (SEROQUEL) 25 MG tablet Take 50 mg by mouth at bedtime.     Historical Provider, MD  sennosides-docusate sodium (SENOKOT-S) 8.6-50 MG tablet Take 2 tablets by mouth daily. Patient taking differently: Take 2 tablets by mouth daily as needed for constipation.  10/13/13   Teryl Lucy, MD  traMADol (ULTRAM) 50 MG tablet Take 50 mg by mouth every 6 (six) hours as needed for moderate pain.  02/05/14   Historical Provider, MD  verapamil (CALAN) 40 MG tablet Take 40 mg by mouth 3 (three) times daily.    Historical Provider, MD   BP 122/94 mmHg  Pulse 97  Temp(Src) 98.4 F (36.9 C) (Oral)  Resp 14  Ht 5\' 9"  (1.753 m)  Wt 224 lb 1 oz (101.634 kg)  BMI 33.07 kg/m2  SpO2 92% Physical Exam  Constitutional: She appears well-developed and well-nourished. No distress.  HENT:  Head: Normocephalic and atraumatic.  Neck: Neck supple.  Cardiovascular: Normal rate and regular rhythm.   Pulmonary/Chest: Effort normal and breath sounds normal. No respiratory distress. She has no wheezes. She has no rales.  Musculoskeletal:  Right lower extremity with only mild tenderness through musculature of calf.  Distal pulses intact and equal bilaterally.  No temperature difference between legs.  Possible slight increased size of right calf vs left.  No erythema.  No masses.   Compartments are soft.  Sensation intact.   Neurological: She is alert.  Skin: She is not diaphoretic.  Nursing note and vitals  reviewed.   ED Course  Procedures (including critical care time) Labs Review Labs Reviewed - No data to display  Imaging Review Dg Tibia/fibula Right  12/07/2014   CLINICAL DATA:  constant, acute onset, moderate right calf pain that started 5.5 hours ago while she was sitting in a chair.  EXAM: RIGHT TIBIA AND FIBULA - 2 VIEW  COMPARISON:  None.  FINDINGS: There is no evidence of fracture or other focal bone lesions. Soft tissues are unremarkable.  IMPRESSION: Negative.   Electronically Signed   By: Burman Nieves M.D.   On: 12/07/2014 22:08   Dg Foot Complete Right  12/07/2014   CLINICAL DATA:  Constant acute onset moderate right calf pain.  EXAM: RIGHT FOOT COMPLETE - 3+ VIEW  COMPARISON:  10/26/2014  FINDINGS: There is a fracture deformity involving the distal phalanx of the fourth toe. There is medial displacement of the distal fracture fragments. The fracture appears subacute to chronic. There is overlying soft tissue swelling.  IMPRESSION: 1. Age-indeterminate fracture involves the distal phalanx of the fourth toe.   Electronically Signed   By: Signa Kell M.D.   On: 12/07/2014 22:10   I have personally reviewed and evaluated these images and lab results as part of my medical decision-making.   EKG Interpretation None       5:11 PM Discussed pt with Dr Madilyn Hook who will also see and evaluate the patient.  Doubts utility of repeat DVT study at this time.    MDM   Final diagnoses:  Pain of right lower leg   Afebrile, nontoxic patient with 3 visits to ED this weekend for right lower leg pain without injury.  I suspect this may be radiculopathy related  to her chronic low back pain, she has no worsening back symptoms and no red flags for back pain.  Clinically her leg is not concerning for cellulitis and she has had negative DVT studies.  Pt slaps and grabs her own leg as she is explaining her pain and this does not seem to cause her any discomfort.  The compartments are soft.  Leg is  neurovascularly intact.  I asked Dr Madilyn Hook to also see and examine the patient as she has returned so many times.  Please see her note for further details.  Labs improved from last night.  2 Percocet, IM decadron given in ED.  D/C home with small amount of gabapentin and close PCP follow up on Monday morning (2 days).   Discussed result, findings, treatment, and follow up  with patient.  Pt given return precautions.  Pt verbalizes understanding and agrees with plan.         Trixie Dredge, PA-C 12/08/14 2108  Tilden Fossa, MD 12/09/14 [phone]

## 2014-12-08 NOTE — Telephone Encounter (Signed)
Noted  

## 2014-12-08 NOTE — ED Notes (Addendum)
Left ER at this time with no distress noted.

## 2014-12-08 NOTE — ED Notes (Addendum)
Pt reports that she has had right lower leg pain since yesterday. Pt was evaluated yesterday for a blood clot in her leg yesterday which came back negative. Pt reports her home pain medication is not working for the pain at this time.

## 2014-12-08 NOTE — Discharge Instructions (Signed)
Read the information below.  Use the prescribed medication as directed.  Please discuss all new medications with your pharmacist.  You may return to the Emergency Department at any time for worsening condition or any new symptoms that concern you.    If you develop fevers, loss of control of bowel or bladder, weakness or numbness in your legs, or are unable to walk, return to the ER for a recheck.     Pain of Unknown Etiology (Pain Without a Known Cause) You have come to your caregiver because of pain. Pain can occur in any part of the body. Often there is not a definite cause. If your laboratory (blood or urine) work was normal and X-rays or other studies were normal, your caregiver may treat you without knowing the cause of the pain. An example of this is the headache. Most headaches are diagnosed by taking a history. This means your caregiver asks you questions about your headaches. Your caregiver determines a treatment based on your answers. Usually testing done for headaches is normal. Often testing is not done unless there is no response to medications. Regardless of where your pain is located today, you can be given medications to make you comfortable. If no physical cause of pain can be found, most cases of pain will gradually leave as suddenly as they came.  If you have a painful condition and no reason can be found for the pain, it is important that you follow up with your caregiver. If the pain becomes worse or does not go away, it may be necessary to repeat tests and look further for a possible cause.  Only take over-the-counter or prescription medicines for pain, discomfort, or fever as directed by your caregiver.  For the protection of your privacy, test results cannot be given over the phone. Make sure you receive the results of your test. Ask how these results are to be obtained if you have not been informed. It is your responsibility to obtain your test results.  You may continue all  activities unless the activities cause more pain. When the pain lessens, it is important to gradually resume normal activities. Resume activities by beginning slowly and gradually increasing the intensity and duration of the activities or exercise. During periods of severe pain, bed rest may be helpful. Lie or sit in any position that is comfortable.  Ice used for acute (sudden) conditions may be effective. Use a large plastic bag filled with ice and wrapped in a towel. This may provide pain relief.  See your caregiver for continued problems. Your caregiver can help or refer you for exercises or physical therapy if necessary. If you were given medications for your condition, do not drive, operate machinery or power tools, or sign legal documents for 24 hours. Do not drink alcohol, take sleeping pills, or take other medications that may interfere with treatment. See your caregiver immediately if you have pain that is becoming worse and not relieved by medications. Document Released: 12/09/2000 Document Revised: 01/04/2013 Document Reviewed: 03/16/2005 [facility] Patient Information 2015 [place], [place]. This information is not intended to replace advice given to you by your health care provider. Make sure you discuss any questions you have with your health care provider.  Musculoskeletal Pain Musculoskeletal pain is muscle and boney aches and pains. These pains can occur in any part of the body. Your caregiver may treat you without knowing the cause of the pain. They may treat you if blood or urine tests, X-rays, and other  tests were normal.  CAUSES There is often not a definite cause or reason for these pains. These pains may be caused by a type of germ (virus). The discomfort may also come from overuse. Overuse includes working out too hard when your body is not fit. Boney aches also come from weather changes. Bone is sensitive to atmospheric pressure changes. HOME CARE INSTRUCTIONS   Ask when your  test results will be ready. Make sure you get your test results.  Only take over-the-counter or prescription medicines for pain, discomfort, or fever as directed by your caregiver. If you were given medications for your condition, do not drive, operate machinery or power tools, or sign legal documents for 24 hours. Do not drink alcohol. Do not take sleeping pills or other medications that may interfere with treatment.  Continue all activities unless the activities cause more pain. When the pain lessens, slowly resume normal activities. Gradually increase the intensity and duration of the activities or exercise.  During periods of severe pain, bed rest may be helpful. Lay or sit in any position that is comfortable.  Putting ice on the injured area.  Put ice in a bag.  Place a towel between your skin and the bag.  Leave the ice on for 15 to 20 minutes, 3 to 4 times a day.  Follow up with your caregiver for continued problems and no reason can be found for the pain. If the pain becomes worse or does not go away, it may be necessary to repeat tests or do additional testing. Your caregiver may need to look further for a possible cause. SEEK IMMEDIATE MEDICAL CARE IF:  You have pain that is getting worse and is not relieved by medications.  You develop chest pain that is associated with shortness or breath, sweating, feeling sick to your stomach (nauseous), or throw up (vomit).  Your pain becomes localized to the abdomen.  You develop any new symptoms that seem different or that concern you. MAKE SURE YOU:   Understand these instructions.  Will watch your condition.  Will get help right away if you are not doing well or get worse. Document Released: 03/16/2005 Document Revised: 06/08/2011 Document Reviewed: 11/18/2012 [facility] Patient Information 2015 [place], [place]. This information is not intended to replace advice given to you by your health care provider. Make sure you discuss any  questions you have with your health care provider.

## 2014-12-09 ENCOUNTER — Encounter (HOSPITAL_COMMUNITY): Payer: Self-pay | Admitting: Emergency Medicine

## 2014-12-09 ENCOUNTER — Emergency Department (HOSPITAL_COMMUNITY)
Admission: EM | Admit: 2014-12-09 | Discharge: 2014-12-09 | Disposition: A | Discharge status: Home / Self Care | Payer: Medicaid Other | Attending: Emergency Medicine | Admitting: Emergency Medicine

## 2014-12-09 ENCOUNTER — Emergency Department (HOSPITAL_COMMUNITY): Payer: Medicaid Other

## 2014-12-09 DIAGNOSIS — Z8619 Personal history of other infectious and parasitic diseases: Secondary | ICD-10-CM | POA: Insufficient documentation

## 2014-12-09 DIAGNOSIS — Z79899 Other long term (current) drug therapy: Secondary | ICD-10-CM | POA: Insufficient documentation

## 2014-12-09 DIAGNOSIS — I10 Essential (primary) hypertension: Secondary | ICD-10-CM | POA: Diagnosis not present

## 2014-12-09 DIAGNOSIS — R011 Cardiac murmur, unspecified: Secondary | ICD-10-CM | POA: Insufficient documentation

## 2014-12-09 DIAGNOSIS — M069 Rheumatoid arthritis, unspecified: Secondary | ICD-10-CM | POA: Diagnosis not present

## 2014-12-09 DIAGNOSIS — Z87828 Personal history of other (healed) physical injury and trauma: Secondary | ICD-10-CM | POA: Diagnosis not present

## 2014-12-09 DIAGNOSIS — J449 Chronic obstructive pulmonary disease, unspecified: Secondary | ICD-10-CM | POA: Diagnosis not present

## 2014-12-09 DIAGNOSIS — R079 Chest pain, unspecified: Secondary | ICD-10-CM | POA: Diagnosis present

## 2014-12-09 DIAGNOSIS — Z9889 Other specified postprocedural states: Secondary | ICD-10-CM | POA: Insufficient documentation

## 2014-12-09 DIAGNOSIS — E785 Hyperlipidemia, unspecified: Secondary | ICD-10-CM | POA: Insufficient documentation

## 2014-12-09 DIAGNOSIS — Z9981 Dependence on supplemental oxygen: Secondary | ICD-10-CM | POA: Insufficient documentation

## 2014-12-09 DIAGNOSIS — M79671 Pain in right foot: Secondary | ICD-10-CM | POA: Insufficient documentation

## 2014-12-09 DIAGNOSIS — F419 Anxiety disorder, unspecified: Secondary | ICD-10-CM | POA: Diagnosis not present

## 2014-12-09 DIAGNOSIS — E119 Type 2 diabetes mellitus without complications: Secondary | ICD-10-CM | POA: Diagnosis not present

## 2014-12-09 DIAGNOSIS — Z791 Long term (current) use of non-steroidal anti-inflammatories (NSAID): Secondary | ICD-10-CM | POA: Insufficient documentation

## 2014-12-09 DIAGNOSIS — Z880 Allergy status to penicillin: Secondary | ICD-10-CM | POA: Diagnosis not present

## 2014-12-09 DIAGNOSIS — G4733 Obstructive sleep apnea (adult) (pediatric): Secondary | ICD-10-CM | POA: Insufficient documentation

## 2014-12-09 DIAGNOSIS — G8929 Other chronic pain: Secondary | ICD-10-CM | POA: Diagnosis not present

## 2014-12-09 DIAGNOSIS — Z720 Tobacco use: Secondary | ICD-10-CM | POA: Diagnosis not present

## 2014-12-09 LAB — BASIC METABOLIC PANEL
Anion gap: 8 (ref 5–15)
BUN: 14 mg/dL (ref 6–20)
CALCIUM: 9.1 mg/dL (ref 8.9–10.3)
CHLORIDE: 99 mmol/L — AB (ref 101–111)
CO2: 27 mmol/L (ref 22–32)
CREATININE: 0.88 mg/dL (ref 0.44–1.00)
GFR calc non Af Amer: >60 mL/min (ref >60)
Glucose, Bld: 88 mg/dL (ref 65–99)
Potassium: 3.9 mmol/L (ref 3.5–5.1)
SODIUM: 134 mmol/L — AB (ref 135–145)

## 2014-12-09 LAB — CBG MONITORING, ED: GLUCOSE-CAPILLARY: 81 mg/dL (ref 65–99)

## 2014-12-09 LAB — I-STAT TROPONIN, ED: Troponin i, poc: 0 ng/mL (ref 0.00–0.08)

## 2014-12-09 LAB — CBC
HCT: 33 % — ABNORMAL LOW (ref 36.0–46.0)
Hemoglobin: 11.2 g/dL — ABNORMAL LOW (ref 12.0–15.0)
MCH: 29.4 pg (ref 26.0–34.0)
MCHC: 33.9 g/dL (ref 30.0–36.0)
MCV: 86.6 fL (ref 78.0–100.0)
PLATELETS: 269 10*3/uL (ref 150–400)
RBC: 3.81 MIL/uL — AB (ref 3.87–5.11)
RDW: 13.7 % (ref 11.5–15.5)
WBC: 20.1 10*3/uL — ABNORMAL HIGH (ref 4.0–10.5)

## 2014-12-09 LAB — D-DIMER, QUANTITATIVE: D-Dimer, Quant: 0.33 ug/mL-FEU (ref 0.00–0.48)

## 2014-12-09 NOTE — ED Provider Notes (Signed)
CSN: [id number]     Arrival date & time 12/09/14  1551 History   First MD Initiated Contact with Patient 12/09/14 1736     Chief Complaint  Patient presents with  . Chest Pain   Vanessa [name] is a 59 y.o. female with a history of heart murmur, COPD, diabetes, hyperlipidemia, hypertension, neuropathy, and lumbar herniated disc he presents to the emergency department complaining of positional lightheadedness, and substernal chest pain today.  The patient reports that she felt lightheaded and felt like she was floating around 11 AM today. She reports that she drank some water and walk to the store and smoked 2 cigarettes. She reports then she started having chest pain around 1 PM today. She reports she had substernal nonradiating chest pain that resolved after arrival to the emergency department. Patient reports that her lightheadedness has resolved after arrival to the emergency department. Patient complains of right leg pain that has been ongoing for several days and she has been seen multiple times in the emergency department for this leg pain. She reports she did not come to the emergency department for this leg pain today. Patient reports she has chronic back pain but she denies having any back pain currently. Patient reports she's had an MRI by her pain management specialist of her lumbar spine recently without showed a herniated lumbar disc. The patient denies fevers, chills, recent illness, shortness of breath, palpitations, leg swelling, back pain, neck pain, room spinning dizziness, abdominal pain, nausea, vomiting, rashes or syncope.   (Consider location/radiation/quality/duration/timing/severity/associated sxs/prior Treatment) HPI  Past Medical History  Diagnosis Date  . Hypertension   . Heart murmur   . Asthma   . Emphysema   . Diabetes mellitus   . Hyperlipidemia   . Allergic rhinitis   . Chronic headache   . Neuropathy   . Grave's disease   . Herniated disc   . COPD (chronic  obstructive pulmonary disease)   . Menopause   . Hernia, umbilical   . Anxiety   . Depression   . Back pain, chronic   . Shingles   . Herniated disc   . Arthritis     rheumatoid  . 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 Generalized anxiety disorder 05/26/2012  . Chronic back pain 05/27/2012    Secondary to L4-5 herniated disc per patient  . OSA (obstructive sleep apnea)     has a cpap  . Wears glasses   . Wears dentures     top  . Tear of lateral meniscus of right knee 10/13/2013  . Alcoholism   . Sleep apnea     wear c-pap  . Allergy   . Emphysema of lung   . GERD (gastroesophageal reflux disease)   . Osteoporosis    Past Surgical History  Procedure Laterality Date  . Cholecystectomy    . Tubal ligation    . Foot surgery      cyst rt foot  . Laparoscopy      age 34  . Eye surgery      eye back in socker-rt  . Knee arthroscopy with lateral menisectomy Right 10/13/2013    Procedure: RIGHT KNEE ARTHROSCOPY WITH PARTIAL LATERAL MENISECTOMY/DEBRIDEMENT/SHAVING ;  Surgeon: Eulas Post, MD;  Location: [facility];  Service: Orthopedics;  Laterality: Right;  . Left heart catheterization with coronary angiogram N/A 05/30/2012    Procedure: LEFT HEART CATHETERIZATION WITH CORONARY ANGIOGRAM;  Surgeon:  [name], MD;  Location: [facility] CATH LAB;  Service: Cardiovascular;  Laterality: N/A;   Family History  Problem Relation Age of Onset  . Emphysema Mother   . Allergies Mother   . Asthma Mother   . Heart disease Mother   . Rheum arthritis Mother   . Diabetes Mother   . Kidney disease Mother   . Allergies Daughter   . Asthma Brother   . Breast cancer Other     aunt  . Lung cancer Brother   . Throat cancer Brother   . Colon cancer Neg Hx   . Colon polyps Neg Hx   . Esophageal cancer Neg Hx   . Rectal cancer Neg Hx   . Stomach cancer Neg Hx    Social History  Substance Use Topics  .  Smoking status: Current Every Day Smoker -- 1.00 packs/day for 40 years    Types: Cigarettes  . Smokeless tobacco: Never Used  . Alcohol Use: No   OB History    Gravida Para Term Preterm AB TAB SAB Ectopic Multiple Living   5 3   2  2   3      Review of Systems  Constitutional: Negative for fever and chills.  HENT: Negative for congestion and sore throat.   Eyes: Negative for visual disturbance.  Respiratory: Negative for cough, shortness of breath and wheezing.   Cardiovascular: Positive for chest pain. Negative for palpitations and leg swelling.  Gastrointestinal: Negative for nausea, vomiting, abdominal pain and diarrhea.  Genitourinary: Negative for dysuria.  Musculoskeletal: Positive for arthralgias. Negative for back pain, joint swelling and neck pain.  Skin: Negative for rash and wound.  Neurological: Positive for light-headedness. Negative for dizziness, syncope, weakness, numbness and headaches.      Allergies  Ciprofloxacin; Fluocinolone; and Penicillins  Home Medications   Prior to Admission medications   Medication Sig Start Date End Date Taking? Authorizing Provider  acyclovir (ZOVIRAX) 400 MG tablet Take 400 mg by mouth 2 (two) times daily.    Yes Historical Provider, MD  albuterol (PROAIR HFA) 108 (90 BASE) MCG/ACT inhaler Inhale 2 puffs into the lungs 4 (four) times daily. For COPD   Yes Historical Provider, MD  atorvastatin (LIPITOR) 40 MG tablet Take 1 tablet (40 mg total) by mouth daily. 10/06/12  Yes Gust Rung, DO  busPIRone (BUSPAR) 10 MG tablet Take 20 mg by mouth 3 (three) times daily.    Yes Historical Provider, MD  cycloSPORINE (RESTASIS) 0.05 % ophthalmic emulsion Place 1 drop into both eyes 2 (two) times daily.   Yes Historical Provider, MD  diclofenac sodium (VOLTAREN) 1 % GEL Apply 2 g topically 4 (four) times daily as needed (pain).    Yes Historical Provider, MD  FLUoxetine (PROZAC) 20 MG capsule Take 60 mg by mouth every morning.    Yes  Historical Provider, MD  glipiZIDE (GLUCOTROL) 10 MG tablet Take 10 mg by mouth 2 (two) times daily before a meal.   Yes Historical Provider, MD  hydrochlorothiazide (HYDRODIURIL) 25 MG tablet Take 25 mg by mouth every morning.    Yes Historical Provider, MD  HYDROcodone-acetaminophen (NORCO) 10-325 MG per tablet Take 1 tablet by mouth every 6 (six) hours as needed for moderate pain.  02/05/14  Yes Historical Provider, MD  ipratropium-albuterol (DUONEB) 0.5-2.5 (3) MG/3ML SOLN Take 3 mLs by nebulization every 6 (six) hours as needed (Asthma).   Yes Historical Provider, MD  levothyroxine (SYNTHROID, LEVOTHROID) 175 MCG tablet Take 175 mcg by  mouth every morning.    Yes Historical Provider, MD  lisinopril (PRINIVIL,ZESTRIL) 10 MG tablet Take 10 mg by mouth daily.   Yes Historical Provider, MD  loratadine (CLARITIN) 10 MG tablet Take 10 mg by mouth daily as needed for allergies.   Yes Historical Provider, MD  meloxicam (MOBIC) 15 MG tablet Take 15 mg by mouth daily.   Yes Historical Provider, MD  metFORMIN (GLUCOPHAGE) 1000 MG tablet Take 1,000 mg by mouth 2 (two) times daily.    Yes Historical Provider, MD  methocarbamol (ROBAXIN) 500 MG tablet Take 1 tablet (500 mg total) by mouth 2 (two) times daily. 12/07/14  Yes Fayrene Helper, PA-C  pregabalin (LYRICA) 25 MG capsule Take 75 mg by mouth 3 (three) times daily.    Yes Historical Provider, MD  pregabalin (LYRICA) 75 MG capsule Take 75 mg by mouth 3 (three) times daily.   Yes Historical Provider, MD  QUEtiapine (SEROQUEL) 25 MG tablet Take 50 mg by mouth at bedtime.    Yes Historical Provider, MD  traMADol (ULTRAM) 50 MG tablet Take 50 mg by mouth every 6 (six) hours as needed for moderate pain.  02/05/14  Yes Historical Provider, MD  verapamil (CALAN) 40 MG tablet Take 40 mg by mouth 3 (three) times daily.   Yes Historical Provider, MD  gabapentin (NEURONTIN) 100 MG capsule Take 1 capsule (100 mg total) by mouth 3 (three) times daily. 12/08/14   Trixie Dredge,  PA-C  sennosides-docusate sodium (SENOKOT-S) 8.6-50 MG tablet Take 2 tablets by mouth daily. Patient taking differently: Take 2 tablets by mouth daily as needed for constipation.  10/13/13   Teryl Lucy, MD   BP 124/95 mmHg  Pulse 90  Temp(Src) 98.7 F (37.1 C) (Oral)  Resp 16  SpO2 100% Physical Exam  Constitutional: She is oriented to person, place, and time. She appears well-developed and well-nourished. No distress.  Nontoxic appearing.  HENT:  Head: Normocephalic and atraumatic.  Mouth/Throat: Oropharynx is clear and moist.  Eyes: Conjunctivae are normal. Pupils are equal, round, and reactive to light. Right eye exhibits no discharge. Left eye exhibits no discharge.  Neck: Normal range of motion. Neck supple. No JVD present. No tracheal deviation present.  Cardiovascular: Normal rate, regular rhythm, normal heart sounds and intact distal pulses.  Exam reveals no gallop and no friction rub.   Bilateral radial, posterior tibialis and dorsalis pedis pulses are intact.    Pulmonary/Chest: Effort normal and breath sounds normal. No respiratory distress. She has no wheezes. She has no rales. She exhibits tenderness.  Lungs are clear to auscultation bilaterally. Patient substernal chest is tender to palpation and reproduces her chest pain.  Abdominal: Soft. Bowel sounds are normal. There is no tenderness. There is no guarding.  Musculoskeletal: Normal range of motion. She exhibits tenderness. She exhibits no edema.  No lower extremity edema. Mild tenderness to her dorsum of her right foot. Patient ambulating in the room without difficulty or assistance. No midline neck or back tenderness. No back edema, deformity, ecchymosis or erythema.  Lymphadenopathy:    She has no cervical adenopathy.  Neurological: She is alert and oriented to person, place, and time. Coordination normal.  Sensation is intact or bilateral upper and lower extremities.  Skin: Skin is warm and dry. No rash noted. She  is not diaphoretic. No erythema. No pallor.  Psychiatric: She has a normal mood and affect. Her behavior is normal.  Nursing note and vitals reviewed.   ED Course  Procedures (including critical care  time) Labs Review Labs Reviewed  BASIC METABOLIC PANEL - Abnormal; Notable for the following:    Sodium 134 (*)    Chloride 99 (*)    All other components within normal limits  CBC - Abnormal; Notable for the following:    WBC 20.1 (*)    RBC 3.81 (*)    Hemoglobin 11.2 (*)    HCT 33.0 (*)    All other components within normal limits  D-DIMER, QUANTITATIVE (NOT AT [facility]  CBG MONITORING, ED  Rosezena Sensor, ED    Imaging Review Dg Chest 2 View  12/09/2014   CLINICAL DATA:  Chest pain  EXAM: CHEST  2 VIEW  COMPARISON:  12/06/2014  FINDINGS: The heart size and mediastinal contours are within normal limits. Both lungs are clear. Chronic right posterior lateral rib fractures noted.  IMPRESSION: No active cardiopulmonary disease.   Electronically Signed   By: Signa Kell M.D.   On: 12/09/2014 17:55   Dg Tibia/fibula Right  12/07/2014   CLINICAL DATA:  constant, acute onset, moderate right calf pain that started 5.5 hours ago while she was sitting in a chair.  EXAM: RIGHT TIBIA AND FIBULA - 2 VIEW  COMPARISON:  None.  FINDINGS: There is no evidence of fracture or other focal bone lesions. Soft tissues are unremarkable.  IMPRESSION: Negative.   Electronically Signed   By: Burman Nieves M.D.   On: 12/07/2014 22:08   Dg Foot Complete Right  12/07/2014   CLINICAL DATA:  Constant acute onset moderate right calf pain.  EXAM: RIGHT FOOT COMPLETE - 3+ VIEW  COMPARISON:  10/26/2014  FINDINGS: There is a fracture deformity involving the distal phalanx of the fourth toe. There is medial displacement of the distal fracture fragments. The fracture appears subacute to chronic. There is overlying soft tissue swelling.  IMPRESSION: 1. Age-indeterminate fracture involves the distal phalanx of the fourth  toe.   Electronically Signed   By: Signa Kell M.D.   On: 12/07/2014 22:10   I have personally reviewed and evaluated these images and lab results as part of my medical decision-making.   EKG Interpretation   Date/Time:  Sunday December 09 2014 16:07:25 EDT Ventricular Rate:  90 PR Interval:  150 QRS Duration: 93 QT Interval:  375 QTC Calculation: 459 R Axis:   22 Text Interpretation:  Sinus rhythm Anteroseptal infarct, old Confirmed by  Lincoln Brigham [phone]) on 12/09/2014 4:56:11 PM      Filed Vitals:   12/09/14 1800 12/09/14 1815  BP: 124/95   Pulse: 90   Temp:  98.7 F (37.1 C)  TempSrc:  Oral  Resp: 16   SpO2: 100%      MDM   Meds given in ED:  Medications - No data to display  New Prescriptions   No medications on file    Final diagnoses:  Chest pain, unspecified chest pain type   This is a 59 y.o. female with a history of heart murmur, COPD, diabetes, hyperlipidemia, hypertension, neuropathy, and lumbar herniated disc he presents to the emergency department complaining of positional lightheadedness, and substernal chest pain today.  The patient reports that she felt lightheaded and felt like she was floating around 11 AM today. She reports that she drank some water and walk to the store and smoked 2 cigarettes. She reports then she started having chest pain around 1 PM today. She reports she had substernal nonradiating chest pain that resolved after arrival to the emergency department. Patient reports that her lightheadedness  has resolved after arrival to the emergency department.  Her only current complaint is right leg pain. Patient has been seen in the emergency department multiple times in the past week for right leg pain and has had an extensive workup for this. Patient also reports she had an MRI of her lumbar spine recently which showed a lumbar herniated disc. On exam the patient is afebrile nontoxic appearing. Patient has substernal chest wall tenderness to  palpation which reproduces her chest pain. Her lungs are clear to auscultation bilaterally. No lower extremity edema. BMP is unremarkable. CBC shows leukocytosis with a white count of 20.1. This is likely due to the patient receiving Decadron injection yesterday. Troponin is negative. Chest x-ray is unremarkable. I had an extensive discussion and had a good report with the patient during interview. Plan was to check a d-dimer, orthostatic vital signs and a delta troponin. Patient reports she is feeling better and no longer has chest pain and would like to go home. I explained her that this would be against my medical advice and I would like her to stay to complete the workup. She reports that she feels like she has had a good enough workup so far and would like to go home now. She signed out AGAINST MEDICAL ADVICE. I advised the patient to follow-up with their primary care provider this week. I advised the patient to return to the emergency department with new or worsening symptoms or new concerns.     Everlene Farrier, PA-C 12/09/14 1907  Tilden Fossa, MD 12/10/14 [phone]

## 2014-12-09 NOTE — ED Notes (Signed)
Per EMS, pt called out today for CP which started at 11:00 today. Upon EMS arrival pt reports she is CP free. Pt reorts CP returned once during transport but has since improved to pain free. Pt has secondary complaint of right calf pain. Pt has been seen and treated for the calf pain multiple times. Pt alert x4. Pt also had a CBG of 62 and was given orange soda and crackers at home.

## 2014-12-09 NOTE — ED Notes (Signed)
Pt requesting to leave AMA. Pt talked to PA Will who went over risks of leaving and we both encouraged pt to stay. Pt decided to leave. Pt helped out via wheelchair. NAD at this time.

## 2014-12-11 ENCOUNTER — Telehealth: Payer: Self-pay | Admitting: Pulmonary Disease

## 2014-12-11 NOTE — Telephone Encounter (Signed)
lmomtcb x1 

## 2014-12-11 NOTE — Telephone Encounter (Signed)
Patient says that she is having a lot of right calf pain, swelling, knots on calf.  Patient had Venous Doppler done and it was negative for DVT.  Patient was in ED 12/09/14 and they did CXR and she said that they told her the Xray didn't show anything.  She is confused because she said that her CT showed something but the CXR didn't.  She said that the ED told her to follow up with her Pulmonary doctor, however, she is not sure who she needs to follow up with.  She wants to know if she needs to follow up with Dr. Craige Cotta, her orthopedist or her PCP.  She says that she is concerned about a Pulmonary Embolism.  (patient has not complaints of chest tightness, chest pain, sob -- only complaint is right calf pain, swelling and noticeable knots on calf)  Dr. Craige Cotta, please advise.

## 2014-12-11 NOTE — Telephone Encounter (Signed)
Patient returned call, she can be reached at the same number she was called.

## 2014-12-12 NOTE — Telephone Encounter (Signed)
Reviewed lab test and CXR results from pt's ER visit on 12/09/14.    Explained to nodular infiltrate on previous CXR and CT chest not as apparent on CXR from 12/09/14.    Also explained that D-Dimer was negative.  Explained this has a very high negative predictive value for thrombo-embolic disease.  She still has pain in her leg and noticed her eyes were bloodshot this morning.  She has appointment with her PCP tomorrow.    No change to current pulmonary therapies, and advised her to keep appointment with PCP.

## 2014-12-16 ENCOUNTER — Emergency Department (HOSPITAL_COMMUNITY): Payer: Medicaid Other

## 2014-12-16 ENCOUNTER — Encounter (HOSPITAL_COMMUNITY): Payer: Self-pay | Admitting: Vascular Surgery

## 2014-12-16 ENCOUNTER — Observation Stay (HOSPITAL_COMMUNITY)
Admission: EM | Admit: 2014-12-16 | Discharge: 2014-12-18 | Disposition: A | Discharge status: Home / Self Care | Payer: Medicaid Other | Attending: Student in an Organized Health Care Education/Training Program | Admitting: Student in an Organized Health Care Education/Training Program

## 2014-12-16 DIAGNOSIS — F329 Major depressive disorder, single episode, unspecified: Secondary | ICD-10-CM | POA: Insufficient documentation

## 2014-12-16 DIAGNOSIS — N179 Acute kidney failure, unspecified: Secondary | ICD-10-CM | POA: Diagnosis not present

## 2014-12-16 DIAGNOSIS — E1140 Type 2 diabetes mellitus with diabetic neuropathy, unspecified: Secondary | ICD-10-CM | POA: Diagnosis not present

## 2014-12-16 DIAGNOSIS — G894 Chronic pain syndrome: Secondary | ICD-10-CM | POA: Insufficient documentation

## 2014-12-16 DIAGNOSIS — E871 Hypo-osmolality and hyponatremia: Secondary | ICD-10-CM

## 2014-12-16 DIAGNOSIS — G4733 Obstructive sleep apnea (adult) (pediatric): Secondary | ICD-10-CM | POA: Insufficient documentation

## 2014-12-16 DIAGNOSIS — M545 Low back pain: Secondary | ICD-10-CM | POA: Diagnosis not present

## 2014-12-16 DIAGNOSIS — I129 Hypertensive chronic kidney disease with stage 1 through stage 4 chronic kidney disease, or unspecified chronic kidney disease: Secondary | ICD-10-CM | POA: Insufficient documentation

## 2014-12-16 DIAGNOSIS — J189 Pneumonia, unspecified organism: Secondary | ICD-10-CM | POA: Insufficient documentation

## 2014-12-16 DIAGNOSIS — R262 Difficulty in walking, not elsewhere classified: Secondary | ICD-10-CM | POA: Diagnosis not present

## 2014-12-16 DIAGNOSIS — Z79899 Other long term (current) drug therapy: Secondary | ICD-10-CM | POA: Insufficient documentation

## 2014-12-16 DIAGNOSIS — E039 Hypothyroidism, unspecified: Secondary | ICD-10-CM | POA: Diagnosis not present

## 2014-12-16 DIAGNOSIS — Z880 Allergy status to penicillin: Secondary | ICD-10-CM | POA: Insufficient documentation

## 2014-12-16 DIAGNOSIS — I10 Essential (primary) hypertension: Secondary | ICD-10-CM

## 2014-12-16 DIAGNOSIS — N189 Chronic kidney disease, unspecified: Secondary | ICD-10-CM | POA: Diagnosis not present

## 2014-12-16 DIAGNOSIS — E119 Type 2 diabetes mellitus without complications: Secondary | ICD-10-CM

## 2014-12-16 DIAGNOSIS — E860 Dehydration: Secondary | ICD-10-CM | POA: Diagnosis not present

## 2014-12-16 DIAGNOSIS — E785 Hyperlipidemia, unspecified: Secondary | ICD-10-CM | POA: Diagnosis not present

## 2014-12-16 DIAGNOSIS — J441 Chronic obstructive pulmonary disease with (acute) exacerbation: Principal | ICD-10-CM | POA: Diagnosis present

## 2014-12-16 DIAGNOSIS — Z794 Long term (current) use of insulin: Secondary | ICD-10-CM | POA: Diagnosis not present

## 2014-12-16 DIAGNOSIS — D649 Anemia, unspecified: Secondary | ICD-10-CM | POA: Insufficient documentation

## 2014-12-16 DIAGNOSIS — F17210 Nicotine dependence, cigarettes, uncomplicated: Secondary | ICD-10-CM | POA: Diagnosis not present

## 2014-12-16 LAB — BASIC METABOLIC PANEL
ANION GAP: 11 (ref 5–15)
BUN: 9 mg/dL (ref 6–20)
CO2: 26 mmol/L (ref 22–32)
Calcium: 8.1 mg/dL — ABNORMAL LOW (ref 8.9–10.3)
Chloride: 90 mmol/L — ABNORMAL LOW (ref 101–111)
Creatinine, Ser: 1.22 mg/dL — ABNORMAL HIGH (ref 0.44–1.00)
GFR calc Af Amer: 56 mL/min — ABNORMAL LOW (ref >60)
GFR, EST NON AFRICAN AMERICAN: 48 mL/min — AB (ref >60)
Glucose, Bld: 202 mg/dL — ABNORMAL HIGH (ref 65–99)
POTASSIUM: 4 mmol/L (ref 3.5–5.1)
SODIUM: 127 mmol/L — AB (ref 135–145)

## 2014-12-16 LAB — HEPATIC FUNCTION PANEL
ALT: 16 U/L (ref 14–54)
AST: 19 U/L (ref 15–41)
Albumin: 3.5 g/dL (ref 3.5–5.0)
Alkaline Phosphatase: 54 U/L (ref 38–126)
Bilirubin, Direct: <0.1 mg/dL — ABNORMAL LOW (ref 0.1–0.5)
Total Bilirubin: 0.5 mg/dL (ref 0.3–1.2)
Total Protein: 6.2 g/dL — ABNORMAL LOW (ref 6.5–8.1)

## 2014-12-16 LAB — CBC
HCT: 32.2 % — ABNORMAL LOW (ref 36.0–46.0)
Hemoglobin: 10.7 g/dL — ABNORMAL LOW (ref 12.0–15.0)
MCH: 29.2 pg (ref 26.0–34.0)
MCHC: 33.2 g/dL (ref 30.0–36.0)
MCV: 88 fL (ref 78.0–100.0)
Platelets: 230 K/uL (ref 150–400)
RBC: 3.66 MIL/uL — ABNORMAL LOW (ref 3.87–5.11)
RDW: 13.8 % (ref 11.5–15.5)
WBC: 16.9 K/uL — ABNORMAL HIGH (ref 4.0–10.5)

## 2014-12-16 LAB — URINALYSIS, ROUTINE W REFLEX MICROSCOPIC
BILIRUBIN URINE: NEGATIVE
Glucose, UA: NEGATIVE mg/dL
Hgb urine dipstick: NEGATIVE
Ketones, ur: NEGATIVE mg/dL
Leukocytes, UA: NEGATIVE
NITRITE: NEGATIVE
Protein, ur: NEGATIVE mg/dL
SPECIFIC GRAVITY, URINE: 1.004 — AB (ref 1.005–1.030)
UROBILINOGEN UA: 0.2 mg/dL (ref 0.0–1.0)
pH: 6.5 (ref 5.0–8.0)

## 2014-12-16 LAB — I-STAT TROPONIN, ED: Troponin i, poc: 0 ng/mL (ref 0.00–0.08)

## 2014-12-16 LAB — LIPASE, BLOOD: Lipase: 25 U/L (ref 22–51)

## 2014-12-16 MED ORDER — IPRATROPIUM BROMIDE 0.02 % IN SOLN
0.5000 mg | Freq: Once | RESPIRATORY_TRACT | Status: AC
  Administered 2014-12-16: 0.5 mg via RESPIRATORY_TRACT
  Filled 2014-12-16: qty 2.5

## 2014-12-16 MED ORDER — METHYLPREDNISOLONE SODIUM SUCC 125 MG IJ SOLR
125.0000 mg | Freq: Once | INTRAMUSCULAR | Status: AC
  Administered 2014-12-16: 125 mg via INTRAVENOUS
  Filled 2014-12-16: qty 2

## 2014-12-16 MED ORDER — SODIUM CHLORIDE 0.9 % IV BOLUS (SEPSIS)
1000.0000 mL | Freq: Once | INTRAVENOUS | Status: AC
  Administered 2014-12-16: 1000 mL via INTRAVENOUS

## 2014-12-16 MED ORDER — HYDROCOD POLST-CPM POLST ER 10-8 MG/5ML PO SUER
5.0000 mL | Freq: Once | ORAL | Status: AC
  Administered 2014-12-16: 5 mL via ORAL
  Filled 2014-12-16: qty 5

## 2014-12-16 MED ORDER — ALBUTEROL (5 MG/ML) CONTINUOUS INHALATION SOLN
15.0000 mg/h | INHALATION_SOLUTION | Freq: Once | RESPIRATORY_TRACT | Status: AC
  Administered 2014-12-16: 15 mg/h via RESPIRATORY_TRACT
  Filled 2014-12-16: qty 20

## 2014-12-16 MED ORDER — MORPHINE SULFATE (PF) 4 MG/ML IV SOLN
4.0000 mg | Freq: Once | INTRAVENOUS | Status: AC
  Administered 2014-12-16: 4 mg via INTRAVENOUS
  Filled 2014-12-16: qty 1

## 2014-12-16 NOTE — ED Notes (Signed)
Pt reports to the ED for eval of cough, SOB, and right rib cage pain. She has a hx of COPD and states this feels like an exacerbation of her COPD. Has multiple complaints. She also reports dizziness with standing and dry mouth. She has been drinking lots of Fanta pineapple sodas. She also reports toe pain to the fourth toe of her right foot. She has hx of recent fracture in that toe. She is also diabetic. She also reports sore throat and hoarse voice. She describes the cough as dry. Pt A&Ox4, resp e/u, and skin warm and dry.

## 2014-12-16 NOTE — ED Notes (Signed)
Pt walked from E40 to the end of the Nurses desk at E45 and back. Pt o2 stats range from 89% to 91%. Pt complains of being dizzy.

## 2014-12-16 NOTE — ED Provider Notes (Signed)
CSN: [id number]     Arrival date & time 12/16/14  1815 History   First MD Initiated Contact with Patient 12/16/14 2020     Chief Complaint  Patient presents with  . COPD  . Toe Pain     (Consider location/radiation/quality/duration/timing/severity/associated sxs/prior Treatment) The history is provided by the patient.  Vanessa [name] is a 59 y.o. female hx of HTN, COPD, DM, chronic back pain, chronic neuropathy here with cough, shortness of breath, abdominal pain, toe pain. Patient states that she has been coughing for the last 2 weeks, worse for the last 3 days. States that she is trying to cough things up but has a hard time coughing up anything. She called her doctor yesterday and was prescribed prednisone 10 mg. She has been using albuterol 3-4 times daily for the last several days with little relief. She also woke up this morning with right upper quadrant pain but has not had much abdominal pain since then. Denies any vomiting. Also tripped over something yesterday and states that she has right third toe pain. Of note, patient was seen multiple times last week for leg pain and had negative DVT studies. Also has history of chronic pain.   Past Medical History  Diagnosis Date  . Hypertension   . Heart murmur   . Asthma   . Emphysema   . Diabetes mellitus   . Hyperlipidemia   . Allergic rhinitis   . Chronic headache   . Neuropathy   . Grave's disease   . Herniated disc   . COPD (chronic obstructive pulmonary disease)   . Menopause   . Hernia, umbilical   . Anxiety   . Depression   . Back pain, chronic   . Shingles   . Herniated disc   . Arthritis     rheumatoid  . 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 Generalized anxiety disorder 05/26/2012  . Chronic back pain 05/27/2012    Secondary to L4-5 herniated disc per patient  . OSA (obstructive sleep apnea)     has a cpap  . Wears glasses   . Wears dentures      top  . Tear of lateral meniscus of right knee 10/13/2013  . Alcoholism   . Sleep apnea     wear c-pap  . Allergy   . Emphysema of lung   . GERD (gastroesophageal reflux disease)   . Osteoporosis    Past Surgical History  Procedure Laterality Date  . Cholecystectomy    . Tubal ligation    . Foot surgery      cyst rt foot  . Laparoscopy      age 24  . Eye surgery      eye back in socker-rt  . Knee arthroscopy with lateral menisectomy Right 10/13/2013    Procedure: RIGHT KNEE ARTHROSCOPY WITH PARTIAL LATERAL MENISECTOMY/DEBRIDEMENT/SHAVING ;  Surgeon: Eulas Post, MD;  Location: [facility];  Service: Orthopedics;  Laterality: Right;  . Left heart catheterization with coronary angiogram N/A 05/30/2012    Procedure: LEFT HEART CATHETERIZATION WITH CORONARY ANGIOGRAM;  Surgeon: [name], MD;  Location: [facility] CATH LAB;  Service: Cardiovascular;  Laterality: N/A;   Family History  Problem Relation Age of Onset  . Emphysema Mother   . Allergies Mother   . Asthma Mother   . Heart disease Mother   . Rheum arthritis Mother   . Diabetes Mother   .  Kidney disease Mother   . Allergies Daughter   . Asthma Brother   . Breast cancer Other     aunt  . Lung cancer Brother   . Throat cancer Brother   . Colon cancer Neg Hx   . Colon polyps Neg Hx   . Esophageal cancer Neg Hx   . Rectal cancer Neg Hx   . Stomach cancer Neg Hx    Social History  Substance Use Topics  . Smoking status: Current Every Day Smoker -- 1.00 packs/day for 40 years    Types: Cigarettes  . Smokeless tobacco: Never Used  . Alcohol Use: No   OB History    Gravida Para Term Preterm AB TAB SAB Ectopic Multiple Living   5 3   2  2   3      Review of Systems  Respiratory: Positive for cough.   Gastrointestinal: Positive for abdominal pain.  Musculoskeletal:       R toe pain   All other systems reviewed and are negative.     Allergies  Ciprofloxacin; Fluocinolone; and  Penicillins  Home Medications   Prior to Admission medications   Medication Sig Start Date End Date Taking? Authorizing Provider  acyclovir (ZOVIRAX) 400 MG tablet Take 400 mg by mouth 2 (two) times daily.     Historical Provider, MD  albuterol (PROAIR HFA) 108 (90 BASE) MCG/ACT inhaler Inhale 2 puffs into the lungs 4 (four) times daily. For COPD    Historical Provider, MD  atorvastatin (LIPITOR) 40 MG tablet Take 1 tablet (40 mg total) by mouth daily. 10/06/12   12/07/12, DO  busPIRone (BUSPAR) 10 MG tablet Take 20 mg by mouth 3 (three) times daily.     Historical Provider, MD  cycloSPORINE (RESTASIS) 0.05 % ophthalmic emulsion Place 1 drop into both eyes 2 (two) times daily.    Historical Provider, MD  diclofenac sodium (VOLTAREN) 1 % GEL Apply 2 g topically 4 (four) times daily as needed (pain).     Historical Provider, MD  FLUoxetine (PROZAC) 20 MG capsule Take 60 mg by mouth every morning.     Historical Provider, MD  gabapentin (NEURONTIN) 100 MG capsule Take 1 capsule (100 mg total) by mouth 3 (three) times daily. 12/08/14   02/07/15, PA-C  glipiZIDE (GLUCOTROL) 10 MG tablet Take 10 mg by mouth 2 (two) times daily before a meal.    Historical Provider, MD  hydrochlorothiazide (HYDRODIURIL) 25 MG tablet Take 25 mg by mouth every morning.     Historical Provider, MD  HYDROcodone-acetaminophen (NORCO) 10-325 MG per tablet Take 1 tablet by mouth every 6 (six) hours as needed for moderate pain.  02/05/14   Historical Provider, MD  ipratropium-albuterol (DUONEB) 0.5-2.5 (3) MG/3ML SOLN Take 3 mLs by nebulization every 6 (six) hours as needed (Asthma).    Historical Provider, MD  levothyroxine (SYNTHROID, LEVOTHROID) 175 MCG tablet Take 175 mcg by mouth every morning.     Historical Provider, MD  lisinopril (PRINIVIL,ZESTRIL) 10 MG tablet Take 10 mg by mouth daily.    Historical Provider, MD  loratadine (CLARITIN) 10 MG tablet Take 10 mg by mouth daily as needed for allergies.    Historical  Provider, MD  meloxicam (MOBIC) 15 MG tablet Take 15 mg by mouth daily.    Historical Provider, MD  metFORMIN (GLUCOPHAGE) 1000 MG tablet Take 1,000 mg by mouth 2 (two) times daily.     Historical Provider, MD  methocarbamol (ROBAXIN) 500 MG tablet Take 1  tablet (500 mg total) by mouth 2 (two) times daily. 12/07/14   Fayrene Helper, PA-C  pregabalin (LYRICA) 25 MG capsule Take 75 mg by mouth 3 (three) times daily.     Historical Provider, MD  pregabalin (LYRICA) 75 MG capsule Take 75 mg by mouth 3 (three) times daily.    Historical Provider, MD  QUEtiapine (SEROQUEL) 25 MG tablet Take 50 mg by mouth at bedtime.     Historical Provider, MD  sennosides-docusate sodium (SENOKOT-S) 8.6-50 MG tablet Take 2 tablets by mouth daily. Patient taking differently: Take 2 tablets by mouth daily as needed for constipation.  10/13/13   Teryl Lucy, MD  traMADol (ULTRAM) 50 MG tablet Take 50 mg by mouth every 6 (six) hours as needed for moderate pain.  02/05/14   Historical Provider, MD  verapamil (CALAN) 40 MG tablet Take 40 mg by mouth 3 (three) times daily.    Historical Provider, MD   BP 112/62 mmHg  Pulse 94  Temp(Src) 98.4 F (36.9 C) (Oral)  Resp 17  SpO2 100% Physical Exam  Constitutional: She is oriented to person, place, and time.  Coughing, uncomfortable   HENT:  Head: Normocephalic.  Mouth/Throat: Oropharynx is clear and moist.  Eyes: Conjunctivae are normal. Pupils are equal, round, and reactive to light.  Neck: Normal range of motion. Neck supple.  Cardiovascular: Normal rate, regular rhythm and normal heart sounds.   Pulmonary/Chest:  Slightly tachypneic, + diffuse wheezing, diminished throughout   Abdominal: Soft. Bowel sounds are normal. She exhibits no distension. There is no tenderness. There is no rebound.  Musculoskeletal: Normal range of motion.  R 3rd toe slightly tender, no obvious deformity   Neurological: She is alert and oriented to person, place, and time.  Skin: Skin is warm  and dry.  Psychiatric: She has a normal mood and affect. Her behavior is normal. Judgment and thought content normal.  Nursing note and vitals reviewed.   ED Course  Procedures (including critical care time) Labs Review Labs Reviewed  BASIC METABOLIC PANEL - Abnormal; Notable for the following:    Sodium 127 (*)    Chloride 90 (*)    Glucose, Bld 202 (*)    Creatinine, Ser 1.22 (*)    Calcium 8.1 (*)    GFR calc non Af Amer 48 (*)    GFR calc Af Amer 56 (*)    All other components within normal limits  CBC - Abnormal; Notable for the following:    WBC 16.9 (*)    RBC 3.66 (*)    Hemoglobin 10.7 (*)    HCT 32.2 (*)    All other components within normal limits  HEPATIC FUNCTION PANEL - Abnormal; Notable for the following:    Total Protein 6.2 (*)    Bilirubin, Direct <0.1 (*)    All other components within normal limits  URINALYSIS, ROUTINE W REFLEX MICROSCOPIC (NOT AT [facility]) - Abnormal; Notable for the following:    Specific Gravity, Urine 1.004 (*)    All other components within normal limits  LIPASE, BLOOD  I-STAT TROPOININ, ED    Imaging Review Dg Chest 2 View  12/16/2014   CLINICAL DATA:  Short of breath. Cough. Right-sided flank pain for 3 weeks. History of COPD and hypertension.  EXAM: CHEST  2 VIEW  COMPARISON:  12/09/2014  FINDINGS: Cardiac silhouette normal in size and configuration. Normal mediastinal and hilar contours. Clear lungs. No pleural effusion pneumothorax.  Bony thorax is intact.  IMPRESSION: No active cardiopulmonary disease.  Electronically Signed   By: Amie Portland M.D.   On: 12/16/2014 19:15   Dg Foot Complete Right  12/16/2014   CLINICAL DATA:  Patient hit foot on dominant shower 1 day ago. Fourth digit pain.  EXAM: RIGHT FOOT COMPLETE - 3+ VIEW  COMPARISON:  12/07/2014  FINDINGS: Resorption of the distal aspect of the middle phalanx of fourth toe with some resorption across the base of the distal phalanx is similar to the prior exam. There is no  acute fracture or dislocation.  No other bony abnormality.  Soft tissues are unremarkable.  IMPRESSION: No acute fracture or dislocation. Stable appearance from the prior exam.   Electronically Signed   By: Amie Portland M.D.   On: 12/16/2014 21:54   I have personally reviewed and evaluated these images and lab results as part of my medical decision-making.   EKG Interpretation   Date/Time:  Sunday December 16 2014 18:33:32 EDT Ventricular Rate:  94 PR Interval:  142 QRS Duration: 90 QT Interval:  364 QTC Calculation: 455 R Axis:   11 Text Interpretation:  Normal sinus rhythm Nonspecific T wave abnormality  Abnormal ECG No significant change since last tracing Confirmed by YAO   MD, DAVID ([id number]) on 12/16/2014 8:46:21 PM      MDM   Final diagnoses:  None   Vanessa [name] is a 59 y.o. female here with cough, ab pain, toe pain. I think primary issue is COPD exacerbation and cough control. She had RUQ abdominal pain that resolved now. Abdomen nontender now so will get LFTs, lipase, UA. Will get R toe xray. Will reassess after nebs, steroids.   11:19 PM After 1 hr nebs, steroids, coughing improved. Ambulated and desat to 89% on RA. Became short of breath with ambulation. Will admit for COPD exacerbation. WBC 16. CXR showed no infiltrate. Na 127 likely from dehydration so given NS 1L. PCP is Dr. Mayford Knife from [place]. Will consult unassigned medicine. Will admit to internal medicine teaching service.     Richardean Canal, MD 12/16/14 [phone]

## 2014-12-17 DIAGNOSIS — J441 Chronic obstructive pulmonary disease with (acute) exacerbation: Secondary | ICD-10-CM | POA: Diagnosis not present

## 2014-12-17 DIAGNOSIS — B370 Candidal stomatitis: Secondary | ICD-10-CM

## 2014-12-17 DIAGNOSIS — E039 Hypothyroidism, unspecified: Secondary | ICD-10-CM

## 2014-12-17 DIAGNOSIS — E785 Hyperlipidemia, unspecified: Secondary | ICD-10-CM

## 2014-12-17 DIAGNOSIS — D649 Anemia, unspecified: Secondary | ICD-10-CM

## 2014-12-17 DIAGNOSIS — G4733 Obstructive sleep apnea (adult) (pediatric): Secondary | ICD-10-CM

## 2014-12-17 DIAGNOSIS — E119 Type 2 diabetes mellitus without complications: Secondary | ICD-10-CM

## 2014-12-17 DIAGNOSIS — E878 Other disorders of electrolyte and fluid balance, not elsewhere classified: Secondary | ICD-10-CM

## 2014-12-17 DIAGNOSIS — N179 Acute kidney failure, unspecified: Secondary | ICD-10-CM

## 2014-12-17 DIAGNOSIS — F17210 Nicotine dependence, cigarettes, uncomplicated: Secondary | ICD-10-CM

## 2014-12-17 DIAGNOSIS — E871 Hypo-osmolality and hyponatremia: Secondary | ICD-10-CM

## 2014-12-17 DIAGNOSIS — E861 Hypovolemia: Secondary | ICD-10-CM | POA: Diagnosis not present

## 2014-12-17 DIAGNOSIS — F329 Major depressive disorder, single episode, unspecified: Secondary | ICD-10-CM

## 2014-12-17 DIAGNOSIS — G8929 Other chronic pain: Secondary | ICD-10-CM

## 2014-12-17 DIAGNOSIS — I10 Essential (primary) hypertension: Secondary | ICD-10-CM

## 2014-12-17 DIAGNOSIS — D72829 Elevated white blood cell count, unspecified: Secondary | ICD-10-CM

## 2014-12-17 DIAGNOSIS — M545 Low back pain: Secondary | ICD-10-CM

## 2014-12-17 DIAGNOSIS — Z9989 Dependence on other enabling machines and devices: Secondary | ICD-10-CM

## 2014-12-17 LAB — BASIC METABOLIC PANEL
Anion gap: 9 (ref 5–15)
BUN: 13 mg/dL (ref 6–20)
CHLORIDE: 98 mmol/L — AB (ref 101–111)
CO2: 28 mmol/L (ref 22–32)
CREATININE: 1.08 mg/dL — AB (ref 0.44–1.00)
Calcium: 8.7 mg/dL — ABNORMAL LOW (ref 8.9–10.3)
GFR calc Af Amer: >60 mL/min (ref >60)
GFR calc non Af Amer: 56 mL/min — ABNORMAL LOW (ref >60)
Glucose, Bld: 323 mg/dL — ABNORMAL HIGH (ref 65–99)
POTASSIUM: 4.3 mmol/L (ref 3.5–5.1)
Sodium: 135 mmol/L (ref 135–145)

## 2014-12-17 LAB — IRON AND TIBC
Iron: 35 ug/dL (ref 28–170)
Saturation Ratios: 11 % (ref 10.4–31.8)
TIBC: 325 ug/dL (ref 250–450)
UIBC: 290 ug/dL

## 2014-12-17 LAB — CBC WITH DIFFERENTIAL/PLATELET
Basophils Absolute: 0 10*3/uL (ref 0.0–0.1)
Basophils Relative: 0 %
Eosinophils Absolute: 0 10*3/uL (ref 0.0–0.7)
Eosinophils Relative: 0 %
HCT: 32 % — ABNORMAL LOW (ref 36.0–46.0)
Hemoglobin: 10.4 g/dL — ABNORMAL LOW (ref 12.0–15.0)
Lymphocytes Relative: 5 %
Lymphs Abs: 0.8 10*3/uL (ref 0.7–4.0)
MCH: 28.7 pg (ref 26.0–34.0)
MCHC: 32.5 g/dL (ref 30.0–36.0)
MCV: 88.2 fL (ref 78.0–100.0)
Monocytes Absolute: 0.2 10*3/uL (ref 0.1–1.0)
Monocytes Relative: 1 %
Neutro Abs: 14.7 10*3/uL — ABNORMAL HIGH (ref 1.7–7.7)
Neutrophils Relative %: 94 %
Platelets: 219 10*3/uL (ref 150–400)
RBC: 3.63 MIL/uL — ABNORMAL LOW (ref 3.87–5.11)
RDW: 13.9 % (ref 11.5–15.5)
WBC: 15.7 10*3/uL — ABNORMAL HIGH (ref 4.0–10.5)

## 2014-12-17 LAB — SODIUM, URINE, RANDOM: SODIUM UR: 37 mmol/L

## 2014-12-17 LAB — VITAMIN B12: Vitamin B-12: 332 pg/mL (ref 180–914)

## 2014-12-17 LAB — CORTISOL-AM, BLOOD: Cortisol - AM: 3.1 ug/dL — ABNORMAL LOW (ref 6.7–22.6)

## 2014-12-17 LAB — RETICULOCYTES
RBC.: 3.66 MIL/uL — ABNORMAL LOW (ref 3.87–5.11)
RETIC COUNT ABSOLUTE: 98.8 10*3/uL (ref 19.0–186.0)
RETIC CT PCT: 2.7 % (ref 0.4–3.1)

## 2014-12-17 LAB — FOLATE: Folate: 8.9 ng/mL (ref >5.9)

## 2014-12-17 LAB — GLUCOSE, CAPILLARY
GLUCOSE-CAPILLARY: 181 mg/dL — AB (ref 65–99)
GLUCOSE-CAPILLARY: 146 mg/dL — AB (ref 65–99)
Glucose-Capillary: 200 mg/dL — ABNORMAL HIGH (ref 65–99)
Glucose-Capillary: 97 mg/dL (ref 65–99)

## 2014-12-17 LAB — CREATININE, URINE, RANDOM: Creatinine, Urine: 29.01 mg/dL

## 2014-12-17 LAB — OSMOLALITY, URINE: Osmolality, Ur: 193 mOsm/kg — ABNORMAL LOW (ref 390–1090)

## 2014-12-17 LAB — OSMOLALITY: Osmolality: 293 mOsm/kg (ref 275–300)

## 2014-12-17 LAB — FERRITIN: FERRITIN: 38 ng/mL (ref 11–307)

## 2014-12-17 LAB — TSH: TSH: 0.048 u[IU]/mL — ABNORMAL LOW (ref 0.350–4.500)

## 2014-12-17 MED ORDER — PROMETHAZINE HCL 25 MG PO TABS
12.5000 mg | ORAL_TABLET | Freq: Four times a day (QID) | ORAL | Status: DC | PRN
  Filled 2014-12-17: qty 1

## 2014-12-17 MED ORDER — ACETAMINOPHEN 325 MG PO TABS
650.0000 mg | ORAL_TABLET | Freq: Four times a day (QID) | ORAL | Status: DC | PRN
  Administered 2014-12-17: 650 mg via ORAL
  Filled 2014-12-17: qty 2

## 2014-12-17 MED ORDER — INSULIN ASPART 100 UNIT/ML ~~LOC~~ SOLN
0.0000 [IU] | Freq: Three times a day (TID) | SUBCUTANEOUS | Status: DC
  Administered 2014-12-17: 3 [IU] via SUBCUTANEOUS
  Administered 2014-12-17 (×2): 4 [IU] via SUBCUTANEOUS

## 2014-12-17 MED ORDER — SODIUM CHLORIDE 0.9 % IV SOLN: INTRAVENOUS | Status: DC

## 2014-12-17 MED ORDER — HYDROCODONE-ACETAMINOPHEN 10-325 MG PO TABS
1.0000 | ORAL_TABLET | Freq: Once | ORAL | Status: AC
  Administered 2014-12-17: 1 via ORAL
  Filled 2014-12-17: qty 1

## 2014-12-17 MED ORDER — ATORVASTATIN CALCIUM 40 MG PO TABS
40.0000 mg | ORAL_TABLET | Freq: Every day | ORAL | Status: DC
  Administered 2014-12-17 – 2014-12-18 (×2): 40 mg via ORAL
  Filled 2014-12-17 (×2): qty 1

## 2014-12-17 MED ORDER — HYDROCODONE-ACETAMINOPHEN 10-325 MG PO TABS
1.0000 | ORAL_TABLET | Freq: Four times a day (QID) | ORAL | Status: DC | PRN
  Administered 2014-12-17 – 2014-12-18 (×5): 1 via ORAL
  Filled 2014-12-17 (×5): qty 1

## 2014-12-17 MED ORDER — IPRATROPIUM-ALBUTEROL 0.5-2.5 (3) MG/3ML IN SOLN
3.0000 mL | Freq: Four times a day (QID) | RESPIRATORY_TRACT | Status: DC
  Administered 2014-12-17 – 2014-12-18 (×2): 3 mL via RESPIRATORY_TRACT
  Filled 2014-12-17 (×2): qty 3

## 2014-12-17 MED ORDER — IPRATROPIUM-ALBUTEROL 0.5-2.5 (3) MG/3ML IN SOLN
3.0000 mL | Freq: Four times a day (QID) | RESPIRATORY_TRACT | Status: DC
  Administered 2014-12-17 (×2): 3 mL via RESPIRATORY_TRACT
  Filled 2014-12-17 (×3): qty 3

## 2014-12-17 MED ORDER — FLUOXETINE HCL 20 MG PO CAPS: 60.0000 mg | ORAL_CAPSULE | Freq: Every morning | ORAL | Status: DC

## 2014-12-17 MED ORDER — ACYCLOVIR 400 MG PO TABS
400.0000 mg | ORAL_TABLET | Freq: Two times a day (BID) | ORAL | Status: DC
  Administered 2014-12-17 – 2014-12-18 (×3): 400 mg via ORAL
  Filled 2014-12-17 (×5): qty 1

## 2014-12-17 MED ORDER — NYSTATIN 100000 UNIT/ML MT SUSP
5.0000 mL | Freq: Four times a day (QID) | OROMUCOSAL | Status: DC
  Administered 2014-12-17 – 2014-12-18 (×6): 500000 [IU] via ORAL
  Filled 2014-12-17 (×5): qty 5

## 2014-12-17 MED ORDER — BENZONATATE 100 MG PO CAPS
100.0000 mg | ORAL_CAPSULE | Freq: Two times a day (BID) | ORAL | Status: DC
  Administered 2014-12-17 (×2): 100 mg via ORAL
  Filled 2014-12-17 (×2): qty 1

## 2014-12-17 MED ORDER — NICOTINE 21 MG/24HR TD PT24
21.0000 mg | MEDICATED_PATCH | Freq: Every day | TRANSDERMAL | Status: DC
  Administered 2014-12-17 – 2014-12-18 (×2): 21 mg via TRANSDERMAL
  Filled 2014-12-17 (×2): qty 1

## 2014-12-17 MED ORDER — SODIUM CHLORIDE 0.9 % IV SOLN
INTRAVENOUS | Status: DC
  Administered 2014-12-17: 02:00:00 via INTRAVENOUS

## 2014-12-17 MED ORDER — PREGABALIN 75 MG PO CAPS
75.0000 mg | ORAL_CAPSULE | Freq: Three times a day (TID) | ORAL | Status: DC
  Administered 2014-12-17 – 2014-12-18 (×4): 75 mg via ORAL
  Filled 2014-12-17 (×4): qty 1

## 2014-12-17 MED ORDER — QUETIAPINE FUMARATE 50 MG PO TABS
50.0000 mg | ORAL_TABLET | Freq: Every day | ORAL | Status: DC
  Administered 2014-12-17 (×2): 50 mg via ORAL
  Filled 2014-12-17 (×2): qty 1

## 2014-12-17 MED ORDER — CYCLOSPORINE 0.05 % OP EMUL
1.0000 [drp] | Freq: Two times a day (BID) | OPHTHALMIC | Status: DC
  Administered 2014-12-17 – 2014-12-18 (×3): 1 [drp] via OPHTHALMIC
  Filled 2014-12-17 (×4): qty 1

## 2014-12-17 MED ORDER — BUSPIRONE HCL 10 MG PO TABS
20.0000 mg | ORAL_TABLET | Freq: Three times a day (TID) | ORAL | Status: DC
  Filled 2014-12-17 (×3): qty 2

## 2014-12-17 MED ORDER — PREDNISONE 20 MG PO TABS
40.0000 mg | ORAL_TABLET | Freq: Every day | ORAL | Status: DC
  Administered 2014-12-18: 40 mg via ORAL
  Filled 2014-12-17: qty 2

## 2014-12-17 MED ORDER — VERAPAMIL HCL 40 MG PO TABS
40.0000 mg | ORAL_TABLET | Freq: Three times a day (TID) | ORAL | Status: DC
  Administered 2014-12-17 – 2014-12-18 (×4): 40 mg via ORAL
  Filled 2014-12-17 (×7): qty 1

## 2014-12-17 MED ORDER — ACETAMINOPHEN 650 MG RE SUPP: 650.0000 mg | Freq: Four times a day (QID) | RECTAL | Status: DC | PRN

## 2014-12-17 MED ORDER — ALBUTEROL SULFATE (2.5 MG/3ML) 0.083% IN NEBU: 2.5000 mg | INHALATION_SOLUTION | RESPIRATORY_TRACT | Status: DC | PRN

## 2014-12-17 MED ORDER — HEPARIN SODIUM (PORCINE) 5000 UNIT/ML IJ SOLN
5000.0000 [IU] | Freq: Three times a day (TID) | INTRAMUSCULAR | Status: DC
  Administered 2014-12-17 – 2014-12-18 (×6): 5000 [IU] via SUBCUTANEOUS
  Filled 2014-12-17 (×5): qty 1

## 2014-12-17 MED ORDER — LEVOTHYROXINE SODIUM 100 MCG PO TABS
175.0000 ug | ORAL_TABLET | Freq: Every morning | ORAL | Status: DC
  Administered 2014-12-17 – 2014-12-18 (×2): 175 ug via ORAL
  Filled 2014-12-17 (×6): qty 1

## 2014-12-17 NOTE — Progress Notes (Signed)
Subjective: Continues to complain of SOB and unproductive cough.   Objective: Vital signs in last 24 hours: Filed Vitals:   12/17/14 0116 12/17/14 0427 12/17/14 0723 12/17/14 1500  BP: 117/64 155/79  117/60  Pulse: 97 100 81 84  Temp: 98.6 F (37 C) 98.4 F (36.9 C)  98.1 F (36.7 C)  TempSrc: Oral Oral  Oral  Resp: 19 19 20 18   SpO2: 98% 97% 98% 98%   Weight change:   Intake/Output Summary (Last 24 hours) at 12/17/14 1710 Last data filed at 12/17/14 0800  Gross per 24 hour  Intake      0 ml  Output    800 ml  Net   -800 ml    Physical Exam GENERAL- alert, co-operative, appears as stated age, not in any distress. CARDIAC- RRR, no murmurs, rubs or gallops. RESP- Moving equal volumes of air, and clear to auscultation bilaterally, no wheezes or crackles. ABDOMEN- Soft, nontender, no guarding or rebound, no palpable masses or organomegaly, bowel sounds presentl. EXTREMITIES- pulse 2+, symmetric, 1+ pedal edema. SKIN- Warm, dry, No rash or lesion.  Medications: I have reviewed the patient's current medications. Scheduled Meds: . acyclovir  400 mg Oral BID  . atorvastatin  40 mg Oral Daily  . benzonatate  100 mg Oral BID  . cycloSPORINE  1 drop Both Eyes BID  . heparin  5,000 Units Subcutaneous 3 times per day  . insulin aspart  0-20 Units Subcutaneous TID WC  . ipratropium-albuterol  3 mL Nebulization Q6H WA  . levothyroxine  175 mcg Oral q morning - 10a  . nicotine  21 mg Transdermal Daily  . nystatin  5 mL Oral QID  . [START ON 12/18/2014] predniSONE  40 mg Oral Q breakfast  . pregabalin  75 mg Oral TID  . QUEtiapine  50 mg Oral QHS  . verapamil  40 mg Oral TID   Continuous Infusions:  PRN Meds:.acetaminophen **OR** acetaminophen, albuterol, HYDROcodone-acetaminophen, promethazine Assessment/Plan: Principal Problem:   COPD exacerbation Active Problems:   OSA (obstructive sleep apnea)   Diabetes mellitus   Hypothyroidism   Renal failure (ARF), acute on  chronic   Essential hypertension  COPD Exacerbation: Clear to ausculation with no wheezing. No signs or symptoms of infection. Holding ABX. Desatted to 89% on ambulation on admission. CT on 12/06/14 with irregular branching nodule in LUL and several nodule in RLL with recommended follow up in 1 month.  Will review with radiology.  - Continue duonebs q6hr while awake - Prednisone 40 mg daily for 5 day course - Will need PFTs as outpatient  Hyponatremia 2/2 Hypovolemia: Resolved following following fluid resuciation. Eating and drinking. Will D/C fluids.   OSA: CPAP qhs  AKI on CKD: Cr improved to 1.08 with fluids, likely 2/2 hypovolemia. Baseline ~0.9-1.0.  - BMET in AM  DM type 2: CBGs 146-200.  - Continue SSI-resistant  - CBGs qac and qhs  HTN: BP stable.  - holding hisinopril will restart at discharge - will d/c hctz at discharge - continue verapamil 40 mg tid  Hypothyroidism: TSH 0.048 - Will continue levothyroxine 175 mcg now. Decrease dose as outpatient.   DVT PPx: Heparin  Dispo: Disposition is deferred at this time, awaiting improvement of current medical problems.  Anticipated discharge in approximately 1-2 day(s).   The patient does not have a current PCP (Provider Not In System) and does need an [facility] hospital follow-up appointment after discharge.  The patient does not have transportation limitations that hinder  transportation to clinic appointments.  .Services Needed at time of discharge: Y = Yes, Blank = No PT:   OT:   RN:   Equipment:   Other:      Valentino Nose, MD IMTS PGY-1 [phone] 12/17/2014, 5:10 PM

## 2014-12-17 NOTE — Evaluation (Signed)
Physical Therapy Evaluation Patient Details Name: Vanessa [name] MRN: [id number] DOB: 08-27-1955 Today's Date: 12/17/2014   History of Present Illness  59 yo female with onset of cough and SOB at home, chest pain and rib pain.  PMHx:  COPD, HTN, DM  Clinical Impression  Pt was able to walk with RW and some assistance with increased listing to L but also some bouts of coughing that resolved with gait.  Her plan is to go home with HHPT and will need RW which she currently doesn't own.  Will follow her as she is available to see in hospital.    Follow Up Recommendations Home health PT;Supervision - Intermittent    Equipment Recommendations  Rolling walker with 5" wheels    Recommendations for Other Services       Precautions / Restrictions Precautions Precautions: Fall Restrictions Weight Bearing Restrictions: No      Mobility  Bed Mobility Overal bed mobility: Needs Assistance Bed Mobility: Sidelying to Sit   Sidelying to sit: Min assist       General bed mobility comments: help to think through sequence and safety  Transfers Overall transfer level: Needs assistance Equipment used: Rolling walker (2 wheeled) Transfers: Sit to/from [employer] Sit to Stand: Min guard Stand pivot transfers: Min guard       General transfer comment: cues for hand placement   Ambulation/Gait Ambulation/Gait assistance: Min guard;Min assist Ambulation Distance (Feet): 160 Feet Assistive device: Rolling walker (2 wheeled);1 person hand held assist Gait Pattern/deviations: Step-through pattern;Drifts right/left;Wide base of support;Decreased dorsiflexion - left;Decreased dorsiflexion - right;Decreased stride length Gait velocity: reduced Gait velocity interpretation: Below normal speed for age/gender General Gait Details: Has drifting to L and backward then catches with walker  Stairs            Wheelchair Mobility    Modified Rankin (Stroke Patients  Only)       Balance Overall balance assessment: Needs assistance (unsteady walking and needs AD to maintain balance) Sitting-balance support: Feet supported Sitting balance-Leahy Scale: Fair   Postural control: Posterior lean Standing balance support: Bilateral upper extremity supported Standing balance-Leahy Scale: Poor Standing balance comment: Poor+ with standing balance and poor with dynamic standing balance                              Pertinent Vitals/Pain Pain Assessment: Faces Faces Pain Scale: Hurts little more Pain Location: L ribcage Pain Descriptors / Indicators: Cramping Pain Intervention(s): Limited activity within patient's tolerance;Premedicated before session    Home Living Family/patient expects to be discharged to:: Private residence Living Arrangements: Alone Available Help at Discharge: Friend(s);Available PRN/intermittently Type of Home: House Home Access: Level entry     Home Layout: One level Home Equipment: Cane - single point Additional Comments: Has managed with cane but needing more help now    Prior Function Level of Independence: Independent with assistive device(s)               Hand Dominance        Extremity/Trunk Assessment   Upper Extremity Assessment: Overall WFL for tasks assessed (has some pain with forceful movement)           Lower Extremity Assessment: Generalized weakness      Cervical / Trunk Assessment: Normal  Communication   Communication: No difficulties  Cognition Arousal/Alertness: Awake/alert Behavior During Therapy: WFL for tasks assessed/performed Overall Cognitive Status: Within Functional Limits for tasks assessed  General Comments General comments (skin integrity, edema, etc.): Better effort is made to get up and walk after trip to [place], pt motivated and should be able to progress to HHPT level with therapy inhouse    Exercises         Assessment/Plan    PT Assessment Patient needs continued PT services  PT Diagnosis Difficulty walking   PT Problem List Decreased strength;Decreased range of motion;Decreased activity tolerance;Decreased balance;Decreased mobility;Decreased coordination;Decreased safety awareness;Pain;Obesity;Cardiopulmonary status limiting activity  PT Treatment Interventions DME instruction;Gait training;Functional mobility training;Therapeutic activities;Therapeutic exercise;Balance training;Neuromuscular re-education;Patient/family education   PT Goals (Current goals can be found in the Care Plan section) Acute Rehab PT Goals Patient Stated Goal: to be able to get around PT Goal Formulation: With patient Time For Goal Achievement: 12/31/14 Potential to Achieve Goals: Good    Frequency Min 2X/week   Barriers to discharge Decreased caregiver support Home alone but a controlled environment    Co-evaluation               End of Session Equipment Utilized During Treatment: Gait belt Activity Tolerance: Patient tolerated treatment well;Patient limited by fatigue;Patient limited by pain Patient left: in chair;with call bell/phone within reach Nurse Communication: Mobility status;Patient requests pain meds;Other (comment) (asked for eye drops and RT)    Functional Assessment Tool Used: clinical judgment Functional Limitation: Mobility: Walking and moving around Mobility: Walking and Moving Around Current Status [phone]): At least 20 percent but less than 40 percent impaired, limited or restricted Mobility: Walking and Moving Around Goal Status [phone]): At least 20 percent but less than 40 percent impaired, limited or restricted    Time: 3716-9678 PT Time Calculation (min) (ACUTE ONLY): 31 min   Charges:   PT Evaluation $Initial PT Evaluation Tier I: 1 Procedure PT Treatments $Gait Training: 8-22 mins   PT G Codes:   PT G-Codes **NOT FOR INPATIENT CLASS** Functional Assessment Tool  Used: clinical judgment Functional Limitation: Mobility: Walking and moving around Mobility: Walking and Moving Around Current Status (L3810): At least 20 percent but less than 40 percent impaired, limited or restricted Mobility: Walking and Moving Around Goal Status [phone]): At least 20 percent but less than 40 percent impaired, limited or restricted    Ivar Drape 12/17/2014, 12:36 PM   Samul Dada, PT MS Acute Rehab Dept. Number: [facility] [id number] and [facility] [phone]

## 2014-12-17 NOTE — Care Management Note (Signed)
Case Management Note  Patient Details  Name: Vanessa [name] MRN: [id number] Date of Birth: 11-18-1955  Subjective/Objective:                    Action/Plan:  PT recommending home health PT , patient's insurance is Medicaid , patient does not have an qualifying DX for home health PT ( Medicaid will not cover HHPT). MD if you , feel patient would benefit from home health RN for COPD management , please order . Thanks Ronny Flurry RN BSN [phone]  Expected Discharge Date:                  Expected Discharge Plan:  Home/Self Care  In-House Referral:     Discharge planning Services  CM Consult  Post Acute Care Choice:    Choice offered to:     DME Arranged:    DME Agency:     HH Arranged:    HH Agency:     Status of Service:  In process, will continue to follow  Medicare Important Message Given:    Date Medicare IM Given:    Medicare IM give by:    Date Additional Medicare IM Given:    Additional Medicare Important Message give by:     If discussed at Long Length of Stay Meetings, dates discussed:    Additional Comments:  Kingsley Plan, RN 12/17/2014, 12:55 PM

## 2014-12-17 NOTE — H&P (Signed)
Date: 12/17/2014               Patient Name:  Vanessa [name] MRN: [id number]  DOB: 01-12-1956 Age / Sex: 59 y.o., female   PCP: Provider Not In System         Medical Service: Internal Medicine Teaching Service         Attending Physician: Dr. Tyson Alias, MD    First Contact: Dr. Valentino Nose, MD Pager: [phone]  Second Contact: Dr. Otis Brace, MD Pager: [phone]       After Hours (After 5p/  First Contact Pager: [phone]  weekends / holidays): Second Contact Pager: [phone]   Chief Complaint: Shortness of Breath on Exertion, Dizziness  History of Present Illness: [name] is a 60 yo woman with a PMH of COPD, HTN, HLD, T2DM, chronic pain syndrome, and depression who presents with a two day history of shortness of breath, cough, and dizziness. She says the SOB is worse on exertion, and she has a difficult time getting from her living room to her kitchen. It has been accompanied by non-productive cough, watery eyes, and a sore throat. She feels her coughing has led to some right rib pain. Her home albuterol did not improve her symptoms. She reached out to her PCP, Vanessa [name], who recommended a 10 day course of 10 mg prednisone and promethazine for a presumed COPD exacerbation. She took her first dose this morning without any improvement. She said she felt a chest pressure about two nights ago at the center of her chest that was non-radiating that did not change whether she was sitting up or down. She denies any orthopnea. She denies, fever, chills, or unintended weight loss. She reports consistent use of her CPAP.  However, her primary reason for seeking emergency medical attention was a "dizziness," that she attributed to a COPD exacerbation. She feels as though she is "drunk" and is constantly following over. She also endorses a droning "factory sound" in her ears. She reports feeling like her mouth is very dry, and has been drinking lots of Fanta pineapple soda in  an attempt to rehydrate. She denies any light-headedness and denies feeling like she might faint. She has no neurological complaints other than a a headache "behind her eyes." She denies any new onset weakness.   She has 4 ED visits recently (9/9, 9/9, 9/10, 9/11). On 9/11 she presented with light-headedness and substernal chest pain. Patient left AMA Troponins, CXR, and d-dimer were gative. On 9/10, p/w lower extremity pain, complaining of knots in her legs. Patient p/w leg pain on 9/9, and was found to have a fracture of R 4th toe (pt had stubbed toe a month prior). At that time, had a negative DVT workup with a negative doppler on the right lower extremity. At CT scan from 9/8 showed left lobe lesion with ddx of neoplasm or infection focus, recommending CT w/o contrast in 1 month to follow-up.  In the ED, she was found to desat to 89% on RA while ambulating and become short of breath on ambulation. She had a leukocytosis to 16 without infiltrate or masses noted on 2-view CXR. She was given 125 mg IV solumedrol. She was found to be hyponatremic to 127 and given 1L NS.   Social History:  Longtime smoker, currently trying to quit with patches but unsuccessful. Two years ago, her son and mother died within months of each other.  Meds: Current Facility-Administered Medications  Medication Dose Route Frequency Provider Last  Rate Last Dose  . 0.9 %  sodium chloride infusion   Intravenous STAT Vanessa Canal, MD      . 0.9 %  sodium chloride infusion   Intravenous Continuous Vanessa Brick, MD      . acetaminophen (TYLENOL) tablet 650 mg  650 mg Oral Q6H PRN Vanessa Brick, MD   650 mg at 12/17/14 0134   Or  . acetaminophen (TYLENOL) suppository 650 mg  650 mg Rectal Q6H PRN Vanessa Brick, MD      . acyclovir (ZOVIRAX) tablet 400 mg  400 mg Oral BID Vanessa Brick, MD      . albuterol (PROVENTIL) (2.5 MG/3ML) 0.083% nebulizer solution 2.5 mg  2.5 mg Nebulization Q3H PRN Vanessa Brick, MD      .  atorvastatin (LIPITOR) tablet 40 mg  40 mg Oral Daily Vanessa Brick, MD      . busPIRone (BUSPAR) tablet 20 mg  20 mg Oral TID Vanessa Brick, MD      . FLUoxetine (PROZAC) capsule 60 mg  60 mg Oral q morning - 10a Vanessa Brick, MD      . heparin injection 5,000 Units  5,000 Units Subcutaneous 3 times per day Vanessa Brick, MD   5,000 Units at 12/17/14 0100  . insulin aspart (novoLOG) injection 0-20 Units  0-20 Units Subcutaneous TID WC Vanessa Brick, MD      . ipratropium-albuterol (DUONEB) 0.5-2.5 (3) MG/3ML nebulizer solution 3 mL  3 mL Nebulization Q6H Vanessa Brick, MD      . nystatin (MYCOSTATIN) 100000 UNIT/ML suspension 500,000 Units  5 mL Oral QID Vanessa Brick, MD      . Melene Muller ON 12/18/2014] predniSONE (DELTASONE) tablet 40 mg  40 mg Oral Q breakfast Vanessa Brick, MD      . pregabalin (LYRICA) capsule 75 mg  75 mg Oral TID Vanessa Brick, MD      . promethazine (PHENERGAN) tablet 12.5 mg  12.5 mg Oral Q6H PRN Vanessa Brick, MD      . QUEtiapine (SEROQUEL) tablet 50 mg  50 mg Oral QHS Vanessa Brick, MD      . verapamil (CALAN) tablet 40 mg  40 mg Oral TID Vanessa Brick, MD        Allergies: Allergies as of 12/16/2014 - Review Complete 12/09/2014  Allergen Reaction Noted  . Ciprofloxacin Hives 03/18/2011  . Fluocinolone Hives 02/07/2014  . Penicillins Hives 03/18/2011   Past Medical History  Diagnosis Date  . Hypertension   . Heart murmur   . Asthma   . Emphysema   . Diabetes mellitus   . Hyperlipidemia   . Allergic rhinitis   . Chronic headache   . Neuropathy   . Grave's disease   . Herniated disc   . COPD (chronic obstructive pulmonary disease)   . Menopause   . Hernia, umbilical   . Anxiety   . Depression   . Back pain, chronic   . Shingles   . Herniated disc   . Arthritis     rheumatoid  . 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 Generalized anxiety disorder 05/26/2012  . Chronic back  pain 05/27/2012    Secondary to L4-5 herniated disc per patient  . OSA (obstructive sleep apnea)     has a cpap  . Wears glasses   . Wears dentures  top  . Tear of lateral meniscus of right knee 10/13/2013  . Alcoholism   . Sleep apnea     wear c-pap  . Allergy   . Emphysema of lung   . GERD (gastroesophageal reflux disease)   . Osteoporosis    Past Surgical History  Procedure Laterality Date  . Cholecystectomy    . Tubal ligation    . Foot surgery      cyst rt foot  . Laparoscopy      age 74  . Eye surgery      eye back in socker-rt  . Knee arthroscopy with lateral menisectomy Right 10/13/2013    Procedure: RIGHT KNEE ARTHROSCOPY WITH PARTIAL LATERAL MENISECTOMY/DEBRIDEMENT/SHAVING ;  Surgeon: Eulas Post, MD;  Location: [facility];  Service: Orthopedics;  Laterality: Right;  . Left heart catheterization with coronary angiogram N/A 05/30/2012    Procedure: LEFT HEART CATHETERIZATION WITH CORONARY ANGIOGRAM;  Surgeon: [name], MD;  Location: [facility] CATH [facility];  Service: Cardiovascular;  Laterality: N/A;   Family History  Problem Relation Age of Onset  . Emphysema Mother   . Allergies Mother   . Asthma Mother   . Heart disease Mother   . Rheum arthritis Mother   . Diabetes Mother   . Kidney disease Mother   . Allergies Daughter   . Asthma Brother   . Breast cancer Other     aunt  . Lung cancer Brother   . Throat cancer Brother   . Colon cancer Neg Hx   . Colon polyps Neg Hx   . Esophageal cancer Neg Hx   . Rectal cancer Neg Hx   . Stomach cancer Neg Hx    Social History   Social History  . Marital Status: Single    Spouse Name: N/A  . Number of Children: 6  . Years of Education: N/A   Occupational History  . Retired    Social History Main Topics  . Smoking status: Current Every Day Smoker -- 1.00 packs/day for 40 years    Types: Cigarettes  . Smokeless tobacco: Never Used  . Alcohol Use: No  . Drug Use: No  . Sexual Activity: No    Other Topics Concern  . Not on file   Social History Narrative   Pt is separated and lives alone.          Review of Systems: CONSTITUTIONAL:  As per HPI. HEENT:  As per HPI CARDIOVASCULAR:  No chest pain, no palpitations RESPIRATORY:  As per HPI GASTROINTESTINAL:  Nausea, constipation. No vomiting or diarrhea GENITOURINARY:  No dysuria, frequency or urgency. MUSCULOSKELETAL:  Rib soreness on right. Right toe pain.  SKIN:  No change in skin, hair or nails. NEUROLOGIC:  No paresthesias, fasciculations, seizures or weakness. PSYCHIATRIC:  Denies suicidal ideation, but feels stressed ENDOCRINE:  No heat or cold intolerance, polyuria or polydipsia.    Physical Exam: Blood pressure 125/61, pulse 99, temperature 98.4 F (36.9 C), temperature source Oral, resp. rate 15, SpO2 98 %. General: Lying in bed, in no acute distress Eyes: Eyes were tearful, bloodshot. Exophthalmos noted. EOMI. PERRL. Mouth: Dry mucous membranes. Thush noted. No tonsillar erythema or exudate Neck: No cervical adenopathy, scar noted on left neck from "dental procedure" Pulmonary: No respiratory distress. Lungs clear to auscultation without wheezes or crackles. Cardiovascular: Regular rate an rhythm without murmurs, rubs, or gallops  Abdomen: Soft, Nontender, Nondistended Ext: 1+ pitting edema on right to upper shin. No edema on left. Distal  pulses 2+. Able to wiggle toes. Neurological:  AAOx3. 5/5 strength in all extremities.   Psychiatric: No feelings of sadness or suicidal ideation.  Lab results: Basic Metabolic Panel:  Recent Labs  09/81/19 1838  NA 127*  K 4.0  CL 90*  CO2 26  GLUCOSE 202*  BUN 9  CREATININE 1.22*  CALCIUM 8.1*   Liver Function Tests:  Recent Labs  12/16/14 1838  AST 19  ALT 16  ALKPHOS 54  BILITOT 0.5  PROT 6.2*  ALBUMIN 3.5    Recent Labs  12/16/14 1838  LIPASE 25   No results for input(s): AMMONIA in the last 72 hours. CBC:  Recent Labs   12/16/14 1838  WBC 16.9*  HGB 10.7*  HCT 32.2*  MCV 88.0  PLT 230    Alcohol Level: No results for input(s): ETH in the last 72 hours. Urinalysis:  Recent Labs  12/16/14 2115  COLORURINE YELLOW  LABSPEC 1.004*  PHURINE 6.5  GLUCOSEU NEGATIVE  HGBUR NEGATIVE  BILIRUBINUR NEGATIVE  KETONESUR NEGATIVE  PROTEINUR NEGATIVE  UROBILINOGEN 0.2  NITRITE NEGATIVE  LEUKOCYTESUR NEGATIVE     Imaging results:  Dg Chest 2 View  12/16/2014   CLINICAL DATA:  Short of breath. Cough. Right-sided flank pain for 3 weeks. History of COPD and hypertension.  EXAM: CHEST  2 VIEW  COMPARISON:  12/09/2014  FINDINGS: Cardiac silhouette normal in size and configuration. Normal mediastinal and hilar contours. Clear lungs. No pleural effusion pneumothorax.  Bony thorax is intact.  IMPRESSION: No active cardiopulmonary disease.   Electronically Signed   By: Amie Portland M.D.   On: 12/16/2014 19:15   Dg Foot Complete Right  12/16/2014   CLINICAL DATA:  Patient hit foot on dominant shower 1 day ago. Fourth digit pain.  EXAM: RIGHT FOOT COMPLETE - 3+ VIEW  COMPARISON:  12/07/2014  FINDINGS: Resorption of the distal aspect of the middle phalanx of fourth toe with some resorption across the base of the distal phalanx is similar to the prior exam. There is no acute fracture or dislocation.  No other bony abnormality.  Soft tissues are unremarkable.  IMPRESSION: No acute fracture or dislocation. Stable appearance from the prior exam.   Electronically Signed   By: Amie Portland M.D.   On: 12/16/2014 21:54    Other results: Ventricular Rate: 94 PR Interval: 142 QRS Duration: 90 QT Interval: 364 QTC Calculation: 455 R Axis: 11 Text Interpretation: Normal sinus rhythm Nonspecific T wave abnormality   Assessment & Plan by Problem:  COPD Exacerbation: Patient was wheezing on admission while desatting on exertion, although her symptoms have improved significantly on exam. PE is unlikely given Wells score  of zero and negative previous workup. There are also no signs of infection. No indication for antibiotic therapy at this time. She has never had PFTs and follows with [facility]. - Duonebs q6h  - Prednisone 40 mg daily - Promethazine 12.5 mg for cough - Continue home Norco 10/325 q6h prn, tramadol q6h prn rib pain (both are for chronic back pain at home) - Consider holding lisinopril due to dry cough on discharge  Hyponatremia:  New onset hyponatremia.with last normal of 134 on 9/11. Symptoms of dysequilibrium and "factory sounds" could be explained by hyponatremia. She appears dry on exam, and has been thirsty, but drinking sodas that will not improve her volume status. It is possible her thiazide diuretic could be playing a role. If she were euvolemic, SIADH could be considered given her multiple psychiatric  medications, although this is not a concern at this time. - Urine Na, Urine Osm, Serum Osm - TSH, Morning Cortisol - Holding Thiazide, ACEi - NS 75 cc/hr - Orthostatic vital signs to reassess volume status  AKI:  Most likely intrinsic injury due to BUN/Cr ratio <10. However, could be component of prerenal. She received IV contrast on the 9/8, but should resolved by now. - Hold Mobic - Hold diuretics which can cause DRESS (CBC w/ diff in morning)  Leukocytosis: WBC 16.9, down from 20.1 on 9/11.Likely related to steroid dosing. No sign of infection, and has been afebrile. - CBC with diff in AM  Anemia:  Consistently anemic since 2015. Now HgB at 10.7 - Anemia panel with iron studies  Hypertension: Stable. - Holding lisinopril, HCTZ - continue Verapamil 40 mg TID  Hyperlipidemia: Continue home atorvastatin 40 mg daily  Type 2 Diabetes:  On Metformin + glipizide at home. SSI-R as inpatient.  Thrush: Nystatin mouth wash  Hypothyroidism: Continue levothyroxine 175 mcg. TSH pending  OSA:  Continue CPAP at night.  Chronic Lower Back Pain:  Patient reports disc herniation.   - Continue home Norco 10/325 q6h prn, tramadol 50 mg q6h prn  Depression: Denies any SI. Consider psych consult if hypovolemic hyponatremia ruled out to adjust psychiatric meds in setting of possible SIADH - Continue home fluoxetine 60 mg daily, buspirone 20 mg daily, seroquel 60 mg at bedtime  DVT Prophylaxis: Heparin [place]  Dispo: Disposition is deferred at this time, awaiting improvement of current medical problems. Anticipated discharge in approximately 1-2 day(s).   The patient does have a current PCP Vanessa Liner, MD) and does not need an [facility] hospital follow-up appointment after discharge.  The patient does not have transportation limitations that hinder transportation to clinic appointments.  Signed: Ruben Im, MD 12/17/2014, 12:30 AM

## 2014-12-17 NOTE — Progress Notes (Signed)
RT added water to CPAP unit in patient's room. Patient stated she does not need ay help placing herself on when she is ready. RT notified patient if she ran into any complications to have RN contact RT.

## 2014-12-18 DIAGNOSIS — J189 Pneumonia, unspecified organism: Secondary | ICD-10-CM

## 2014-12-18 DIAGNOSIS — J441 Chronic obstructive pulmonary disease with (acute) exacerbation: Secondary | ICD-10-CM | POA: Diagnosis not present

## 2014-12-18 LAB — CBC
HEMATOCRIT: 33.6 % — AB (ref 36.0–46.0)
Hemoglobin: 10.7 g/dL — ABNORMAL LOW (ref 12.0–15.0)
MCH: 28.5 pg (ref 26.0–34.0)
MCHC: 31.8 g/dL (ref 30.0–36.0)
MCV: 89.6 fL (ref 78.0–100.0)
Platelets: 206 10*3/uL (ref 150–400)
RBC: 3.75 MIL/uL — AB (ref 3.87–5.11)
RDW: 14.4 % (ref 11.5–15.5)
WBC: 16.2 10*3/uL — AB (ref 4.0–10.5)

## 2014-12-18 LAB — BASIC METABOLIC PANEL
ANION GAP: 4 — AB (ref 5–15)
BUN: 14 mg/dL (ref 6–20)
CALCIUM: 8.6 mg/dL — AB (ref 8.9–10.3)
CO2: 32 mmol/L (ref 22–32)
Chloride: 102 mmol/L (ref 101–111)
Creatinine, Ser: 0.91 mg/dL (ref 0.44–1.00)
GFR calc Af Amer: >60 mL/min (ref >60)
GFR calc non Af Amer: >60 mL/min (ref >60)
GLUCOSE: 127 mg/dL — AB (ref 65–99)
Potassium: 4 mmol/L (ref 3.5–5.1)
Sodium: 138 mmol/L (ref 135–145)

## 2014-12-18 LAB — HIV ANTIBODY (ROUTINE TESTING W REFLEX): HIV Screen 4th Generation wRfx: NONREACTIVE

## 2014-12-18 LAB — MAGNESIUM: Magnesium: 1.8 mg/dL (ref 1.7–2.4)

## 2014-12-18 LAB — GLUCOSE, CAPILLARY
GLUCOSE-CAPILLARY: 103 mg/dL — AB (ref 65–99)
Glucose-Capillary: 101 mg/dL — ABNORMAL HIGH (ref 65–99)

## 2014-12-18 LAB — TECHNOLOGIST SMEAR REVIEW

## 2014-12-18 MED ORDER — AZITHROMYCIN 250 MG PO TABS: 250.0000 mg | ORAL_TABLET | Freq: Every day | ORAL | Status: DC

## 2014-12-18 MED ORDER — AZITHROMYCIN 250 MG PO TABS
250.0000 mg | ORAL_TABLET | Freq: Every day | ORAL | Status: DC
Start: 2014-12-19 — End: 2014-12-18

## 2014-12-18 MED ORDER — SENNOSIDES-DOCUSATE SODIUM 8.6-50 MG PO TABS
1.0000 | ORAL_TABLET | Freq: Two times a day (BID) | ORAL | Status: DC
Start: 2014-12-18 — End: 2014-12-18
  Administered 2014-12-18: 1 via ORAL
  Filled 2014-12-18: qty 1

## 2014-12-18 MED ORDER — TIOTROPIUM BROMIDE MONOHYDRATE 18 MCG IN CAPS
18.0000 ug | ORAL_CAPSULE | Freq: Every day | RESPIRATORY_TRACT | Status: DC
  Administered 2014-12-18: 18 ug via RESPIRATORY_TRACT
  Filled 2014-12-18: qty 5

## 2014-12-18 MED ORDER — HYDROCODONE-ACETAMINOPHEN 10-325 MG PO TABS: 1.0000 | ORAL_TABLET | Freq: Four times a day (QID) | ORAL | Status: DC | PRN

## 2014-12-18 MED ORDER — GUAIFENESIN-DM 100-10 MG/5ML PO SYRP
5.0000 mL | ORAL_SOLUTION | ORAL | Status: DC | PRN
  Administered 2014-12-18: 5 mL via ORAL
  Filled 2014-12-18 (×2): qty 5

## 2014-12-18 MED ORDER — PREDNISONE 20 MG PO TABS: 40.0000 mg | ORAL_TABLET | Freq: Every day | ORAL | Status: DC

## 2014-12-18 MED ORDER — AZITHROMYCIN 250 MG PO TABS
500.0000 mg | ORAL_TABLET | Freq: Every day | ORAL | Status: AC
  Administered 2014-12-18: 500 mg via ORAL
  Filled 2014-12-18: qty 2

## 2014-12-18 MED ORDER — TIOTROPIUM BROMIDE MONOHYDRATE 18 MCG IN CAPS: 18.0000 ug | ORAL_CAPSULE | Freq: Every day | RESPIRATORY_TRACT | Status: AC

## 2014-12-18 NOTE — Progress Notes (Signed)
Physical Therapy Treatment Patient Details Name: Vanessa [name] MRN: [id number] DOB: Apr 28, 1955 Today's Date: 12/18/2014    History of Present Illness 59 yo female with onset of cough and SOB at home, chest pain and rib pain.  PMHx:  COPD, HTN, DM    PT Comments    Patient progressing very well. Mod I with mobility. Encouraged daily ambulation. O2 sats 100% on RA after ambulation. Patient able to converse the entire time ambulating wihtout SOB noted. Will sign off on acute PT at this time.   Follow Up Recommendations  Home health PT;Supervision - Intermittent (for safety eval and follow up visit)     Equipment Recommendations  Rolling walker with 5" wheels    Recommendations for Other Services       Precautions / Restrictions Precautions Precautions: None    Mobility  Bed Mobility Overal bed mobility: Modified Independent                Transfers Overall transfer level: Modified independent                  Ambulation/Gait Ambulation/Gait assistance: Modified independent (Device/Increase time)               Stairs            Wheelchair Mobility    Modified Rankin (Stroke Patients Only)       Balance                                    Cognition Arousal/Alertness: Awake/alert Behavior During Therapy: WFL for tasks assessed/performed Overall Cognitive Status: Within Functional Limits for tasks assessed                      Exercises      General Comments        Pertinent Vitals/Pain Pain Assessment: No/denies pain    Home Living                      Prior Function            PT Goals (current goals can now be found in the care plan section) Progress towards PT goals: Progressing toward goals    Frequency       PT Plan      Co-evaluation             End of Session   Activity Tolerance: Patient tolerated treatment well Patient left: in chair;with call bell/phone within  reach     Time: 1145-1210 PT Time Calculation (min) (ACUTE ONLY): 25 min  Charges:  $Gait Training: 23-37 mins                    G Codes:      Fredrich Birks 12/18/2014, 12:15 PM 12/18/2014 Robinette, Adline Potter PTA

## 2014-12-18 NOTE — Discharge Instructions (Signed)
Ms. Saltsman, we treated you for a COPD flare and a walking pneumonia. We are sending you home with a prescription for a new medication, Spiriva. It is a daily maintaince inhaler. You should use this every day. Your albuterol (ProAir) is a rescue inhaler and you should only use it when as needed for shortness of breath. We are also sending you home with a prescription for an antibiotic, Azithromycin. You received one dose here and will need to finish 4 more days at home. Continue to take it until the bottle in empty.  Please follow up with your PCP in the next week.  You have an appointment with Dr. Craige Cotta on 10/24 at 11:30 AM as well.

## 2014-12-18 NOTE — Progress Notes (Signed)
Pt discharged to home.  Discharge instructions explained to pt.  Pt has no questions at the time of discharge.  Dr. Karma Greaser came into room at discharge to sign pain med prescription and reiterated some of the discharge instructions with the patient and asked if she had any questions.  IV dc'd.  Pt stated she had all belongings.  Pt ambulated off the floor on her own.

## 2014-12-18 NOTE — Progress Notes (Signed)
.   Subjective: Vanessa [name] reports continued cough that is now productive of gray mucus. Complaining of diffuse chest pain and abdominal pain associated with the cough. Reports chills overnight. SOB is improved at rest but still having dyspnea with exertion.   Objective: Vital signs in last 24 hours: Filed Vitals:   12/17/14 2117 12/18/14 0147 12/18/14 0516 12/18/14 0523  BP: 109/82  102/75 109/69  Pulse: 76  44 67  Temp: 98.5 F (36.9 C)  97.6 F (36.4 C) 97.6 F (36.4 C)  TempSrc: Oral  Axillary Axillary  Resp: 18  18 18   SpO2: 98% 96% 97% 99%   Weight change:   Intake/Output Summary (Last 24 hours) at 12/18/14 0843 Last data filed at 12/18/14 0524  Gross per 24 hour  Intake    240 ml  Output   2000 ml  Net  -1760 ml    Physical Exam GENERAL- alert, co-operative, appears as stated age, not in any distress. CARDIAC- RRR, no murmurs, rubs or gallops. RESP- Moving equal volumes of air, and clear to auscultation bilaterally, no wheezes or crackles. ABDOMEN- Soft, nontender, no guarding or rebound, no palpable masses or organomegaly, bowel sounds presentl. EXTREMITIES- pulse 2+, symmetric, trace pedal edema. SKIN- Warm, dry, No rash or lesion.  Medications: I have reviewed the patient's current medications. Scheduled Meds: . acyclovir  400 mg Oral BID  . atorvastatin  40 mg Oral Daily  . cycloSPORINE  1 drop Both Eyes BID  . heparin  5,000 Units Subcutaneous 3 times per day  . insulin aspart  0-20 Units Subcutaneous TID WC  . levothyroxine  175 mcg Oral q morning - 10a  . nicotine  21 mg Transdermal Daily  . nystatin  5 mL Oral QID  . predniSONE  40 mg Oral Q breakfast  . pregabalin  75 mg Oral TID  . QUEtiapine  50 mg Oral QHS  . tiotropium  18 mcg Inhalation Daily  . verapamil  40 mg Oral TID   Continuous Infusions:  PRN Meds:.acetaminophen **OR** acetaminophen, albuterol, guaiFENesin-dextromethorphan, HYDROcodone-acetaminophen,  promethazine Assessment/Plan: Principal Problem:   COPD exacerbation Active Problems:   OSA (obstructive sleep apnea)   Diabetes mellitus   Hypothyroidism   Renal failure (ARF), acute on chronic   Essential hypertension  COPD Exacerbation: Clear to ausculation with no wheezing. Having productive cough and reports chills overnight. WBC chronically elevated likely 2/2 chronic smoking. Will start her on Azithromcyin today. She did not desat with ambulation today. Will start her on tiotropium 18 mcg daily.  - Albuterol neb q3hr prn - Tiotroprium 18 mcg daily - Prednisone 40 mg day 3/5 - Azithromcyin 500 mg x 1 dose today, continue Azithromycin 250 mg for 4 more days - Will need PFTs outpatient - Follow up with pulm on 10/24 for repeat CT  OSA: CPAP qhs  AKI on CKD: Cr improved to 1.08 with fluids, likely 2/2 hypovolemia. Baseline ~0.9-1.0.  - BMET in AM  DM type 2: CBGs 97-181  - Continue SSI-resistant  - CBGs qac and qhs  HTN: BP stable.  - holding hisinopril will restart at discharge - will d/c hctz at discharge - continue verapamil 40 mg tid  Hypothyroidism: TSH 0.048 - Will continue levothyroxine 175 mcg now. Decrease dose as outpatient.   DVT PPx: Heparin  Dispo: Ready for discharge today  The patient does not have a current PCP (Provider Not In System) and does need an [facility] hospital follow-up appointment after discharge.  The patient does not  have transportation limitations that hinder transportation to clinic appointments.  .Services Needed at time of discharge: Y = Yes, Blank = No PT:   OT:   RN:   Equipment:   Other:      Valentino Nose, MD IMTS PGY-1 [phone] 12/18/2014, 8:43 AM

## 2014-12-18 NOTE — Discharge Summary (Signed)
Name: Niasha Devins MRN: [id number] DOB: 06-Nov-1955 59 y.o. PCP: Provider Not In System  Date of Admission: 12/16/2014  8:08 PM Date of Discharge: 12/18/2014 Attending Physician: Dr. Erlinda Hong  Discharge Diagnosis: Principal Problem:   COPD exacerbation Active Problems:   OSA (obstructive sleep apnea)   Diabetes mellitus   Hypothyroidism   Renal failure (ARF), acute on chronic   Essential hypertension   Atypical pneumonia  Discharge Medications:   Medication List    STOP taking these medications        gabapentin 100 MG capsule  Commonly known as:  NEURONTIN     hydrochlorothiazide 25 MG tablet  Commonly known as:  HYDRODIURIL      TAKE these medications        acyclovir 400 MG tablet  Commonly known as:  ZOVIRAX  Take 400 mg by mouth 2 (two) times daily.     atorvastatin 40 MG tablet  Commonly known as:  LIPITOR  Take 1 tablet (40 mg total) by mouth daily.     azithromycin 250 MG tablet  Commonly known as:  ZITHROMAX  Take 1 tablet (250 mg total) by mouth daily.     busPIRone 10 MG tablet  Commonly known as:  BUSPAR  Take 20 mg by mouth 3 (three) times daily.     cycloSPORINE 0.05 % ophthalmic emulsion  Commonly known as:  RESTASIS  Place 1 drop into both eyes 2 (two) times daily.     diclofenac sodium 1 % Gel  Commonly known as:  VOLTAREN  Apply 2 g topically 4 (four) times daily as needed (pain).     glipiZIDE 10 MG tablet  Commonly known as:  GLUCOTROL  Take 10 mg by mouth 2 (two) times daily before a meal.     HYDROcodone-acetaminophen 10-325 MG per tablet  Commonly known as:  NORCO  Take 1 tablet by mouth every 6 (six) hours as needed for moderate pain.     ipratropium-albuterol 0.5-2.5 (3) MG/3ML Soln  Commonly known as:  DUONEB  Take 3 mLs by nebulization every 6 (six) hours as needed (Asthma).     levothyroxine 175 MCG tablet  Commonly known as:  SYNTHROID, LEVOTHROID  Take 175 mcg by mouth every morning.     lisinopril 10  MG tablet  Commonly known as:  PRINIVIL,ZESTRIL  Take 10 mg by mouth daily.     loratadine 10 MG tablet  Commonly known as:  CLARITIN  Take 10 mg by mouth daily as needed for allergies.     meloxicam 15 MG tablet  Commonly known as:  MOBIC  Take 15 mg by mouth daily.     metFORMIN 1000 MG tablet  Commonly known as:  GLUCOPHAGE  Take 1,000 mg by mouth 2 (two) times daily.     methocarbamol 500 MG tablet  Commonly known as:  ROBAXIN  Take 1 tablet (500 mg total) by mouth 2 (two) times daily.     predniSONE 20 MG tablet  Commonly known as:  DELTASONE  Take 2 tablets (40 mg total) by mouth daily with breakfast.     pregabalin 75 MG capsule  Commonly known as:  LYRICA  Take 75 mg by mouth 3 (three) times daily.     PROAIR HFA 108 (90 BASE) MCG/ACT inhaler  Generic drug:  albuterol  Inhale 2 puffs into the lungs 4 (four) times daily. For COPD     PROZAC 20 MG capsule  Generic drug:  FLUoxetine  Take 60 mg  by mouth every morning.     QUEtiapine 25 MG tablet  Commonly known as:  SEROQUEL  Take 50 mg by mouth at bedtime.     sennosides-docusate sodium 8.6-50 MG tablet  Commonly known as:  SENOKOT-S  Take 2 tablets by mouth daily.     tiotropium 18 MCG inhalation capsule  Commonly known as:  SPIRIVA  Place 1 capsule (18 mcg total) into inhaler and inhale daily.     traMADol 50 MG tablet  Commonly known as:  ULTRAM  Take 50 mg by mouth every 6 (six) hours as needed for moderate pain.     verapamil 40 MG tablet  Commonly known as:  CALAN  Take 40 mg by mouth 3 (three) times daily.        Disposition and follow-up:   Ms.Matilde Vandeusen was discharged from [facility] in Good condition.  At the hospital follow up visit please address:  1. Ms. Juan Quam was admitted for COPD exacerbation. We had her on a 5 day course of Prednisone and Azithromycin. We also started her on Spiriva. Would recommend PFTs for her COPD. 2. She is trying to quit smoking  and is currently using nicotine patches. She is interested in further help to quit smoking. 3. She has a follow up Chest CT at end end of October with follow up with Dr. Craige Cotta.  4. Her TSH was low. Would recommend recheck and consider decreasing her levothyroxine dose.  5. We stopped her HCTZ as she was volume depleted with AKI and hyponatremic. Reassess her BP at follow up.  6.  Labs / imaging needed at time of follow-up:   Labs : Please have your TSH/Cortisol levels rechecked;   Tests: Pulmonary function testing to include DLCO and lung volumes  Imaging: Repeat CT scan of chest with contrast to re-evaluate pulmonary nodules  7.  Pending labs/ test needing follow-up: None Pending.   Follow-up Appointments: Follow-up Information    Schedule an appointment as soon as possible for a visit with Jearld Lesch, MD.   Specialty:  Specialist   Contact information:   [address] [place] [address] [phone]       Follow up with Coralyn Helling, MD On 01/21/2015.   Specialty:  Pulmonary Disease   Why:  11:30 AM   Contact information:   [address] [place] [address] [phone]       Discharge Instructions: Discharge Instructions    Call MD for:  difficulty breathing, headache or visual disturbances    Complete by:  As directed      Call MD for:  persistant dizziness or light-headedness    Complete by:  As directed      Call MD for:  temperature >100.4    Complete by:  As directed      Diet - low sodium heart healthy    Complete by:  As directed      Increase activity slowly    Complete by:  As directed            Consultations:  1) Physical Therapy Recommendations : Rolling walker with 5" wheels  Procedures Performed:  Dg Chest 2 View  12/16/2014   CLINICAL DATA:  Short of breath. Cough. Right-sided flank pain for 3 weeks. History of COPD and hypertension.  EXAM: CHEST  2 VIEW  COMPARISON:  12/09/2014  FINDINGS: Cardiac silhouette normal in size and  configuration. Normal mediastinal and hilar contours. Clear lungs. No pleural effusion pneumothorax.  Bony thorax is intact.  IMPRESSION: No active cardiopulmonary disease.   Electronically Signed   By: Amie Portland M.D.   On: 12/16/2014 19:15   Dg Chest 2 View  12/09/2014   CLINICAL DATA:  Chest pain  EXAM: CHEST  2 VIEW  COMPARISON:  12/06/2014  FINDINGS: The heart size and mediastinal contours are within normal limits. Both lungs are clear. Chronic right posterior lateral rib fractures noted.  IMPRESSION: No active cardiopulmonary disease.   Electronically Signed   By: Signa Kell M.D.   On: 12/09/2014 17:55   Dg Chest 2 View  11/28/2014   CLINICAL DATA:  Cough, congestion, shortness of breath for 3 weeks, smoking history  EXAM: CHEST  2 VIEW  COMPARISON:  Chest x-ray of 07/23/2013  FINDINGS: No active infiltrate or effusion is seen. However, better seen on the lateral view there is a vague nodular opacity anteriorly in the retrosternal airspace measuring approximately 15 mm. This could simply be due to bony and vascular overlap, but a developing pulmonary lesion cannot be excluded with this area not seen on the prior lateral chest x-ray. On the frontal view this nodular opacity could be in the periphery of the left upper lung field. CT of the chest is recommended without IV contrast to assess further in this patient with a smoking history. Mediastinal and hilar contours are unremarkable. The heart is within normal limits in size. No bony abnormality is seen.  IMPRESSION: Vague nodular opacity anteriorly in the retrosternal airspace on the lateral view possibly in the left upper lobe. Recommend CT of the chest in view of the patient's smoking history.   Electronically Signed   By: [name] M.D.   On: 11/28/2014 13:45   Dg Tibia/fibula Right  12/07/2014   CLINICAL DATA:  constant, acute onset, moderate right calf pain that started 5.5 hours ago while she was sitting in a chair.  EXAM: RIGHT TIBIA  AND FIBULA - 2 VIEW  COMPARISON:  None.  FINDINGS: There is no evidence of fracture or other focal bone lesions. Soft tissues are unremarkable.  IMPRESSION: Negative.   Electronically Signed   By: Burman Nieves M.D.   On: 12/07/2014 22:08   Ct Chest W Contrast  12/06/2014   CLINICAL DATA:  Lung nodule on chest radiograph. Decreased chest pain and cough for 3 weeks. History of COPD.  EXAM: CT CHEST WITH CONTRAST  TECHNIQUE: Multidetector CT imaging of the chest was performed during intravenous contrast administration.  CONTRAST:  80mL OMNIPAQUE IOHEXOL 300 MG/ML  SOLN  COMPARISON:  Radiograph 11/28/2014  FINDINGS: Mediastinum/Nodes: No axillary or supraclavicular lymphadenopathy. No mediastinal hilar lymphadenopathy. No pericardial fluid.  Lungs/Pleura: There is a irregular nodule in the LEFT upper lobe which does have a a branching/segmental pattern suggesting involvement of airway. This nodule measures approximately 15 mm image 20, series 3 is seen more elongated on image 55, series 602.  At the RIGHT lung base there is focus of nodular consolidation measuring 15 mm image 41, series 3. There is nearby 3 mm nodule image 36. A second nearby nodule on image 80 also as a somewhat branching pattern. 2 mm nodule along the horizontal fissure (image 28, series 3)  Upper abdomen: Limited view of the liver, kidneys, pancreas are unremarkable. Normal adrenal glands.  Musculoskeletal: No aggressive osseous lesion.  Osseous lesion.  IMPRESSION: 1. Irregular branching nodule in the LEFT upper lobe corresponds to the lesion on comparison chest radiograph. Differential includes an early neoplasm versus a focus  of infection. Recommend short-term follow-up CT without contrast to determine if lesion persists (1 month) and warrants further workup. 2. Several nodules in the RIGHT lower lobe are clustered in the same region and favored to represent infectious or inflammatory process. Recommend re-evaluation CT in 1 month as above.    Electronically Signed   By: Genevive Bi M.D.   On: 12/06/2014 11:08   Dg Foot Complete Right  12/16/2014   CLINICAL DATA:  Patient hit foot on dominant shower 1 day ago. Fourth digit pain.  EXAM: RIGHT FOOT COMPLETE - 3+ VIEW  COMPARISON:  12/07/2014  FINDINGS: Resorption of the distal aspect of the middle phalanx of fourth toe with some resorption across the base of the distal phalanx is similar to the prior exam. There is no acute fracture or dislocation.  No other bony abnormality.  Soft tissues are unremarkable.  IMPRESSION: No acute fracture or dislocation. Stable appearance from the prior exam.   Electronically Signed   By: Amie Portland M.D.   On: 12/16/2014 21:54   Dg Foot Complete Right  12/07/2014   CLINICAL DATA:  Constant acute onset moderate right calf pain.  EXAM: RIGHT FOOT COMPLETE - 3+ VIEW  COMPARISON:  10/26/2014  FINDINGS: There is a fracture deformity involving the distal phalanx of the fourth toe. There is medial displacement of the distal fracture fragments. The fracture appears subacute to chronic. There is overlying soft tissue swelling.  IMPRESSION: 1. Age-indeterminate fracture involves the distal phalanx of the fourth toe.   Electronically Signed   By: Signa Kell M.D.   On: 12/07/2014 22:10   Admission HPI: Per the H&P note dated 12/17/2014 by Ruben Im, MD :  Ms. Shafer is a 59 yo woman with a PMH of COPD, HTN, HLD, T2DM, chronic pain syndrome, and depression who presents with a two day history of shortness of breath, cough, and dizziness. She says the SOB is worse on exertion, and she has a difficult time getting from her living room to her kitchen. It has been accompanied by non-productive cough, watery eyes, and a sore throat. She feels her coughing has led to some right rib pain. Her home albuterol did not improve her symptoms. She reached out to her PCP, Lerry Liner, who recommended a 10 day course of 10 mg prednisone and promethazine for a presumed COPD  exacerbation. She took her first dose this morning without any improvement. She said she felt a chest pressure about two nights ago at the center of her chest that was non-radiating that did not change whether she was sitting up or down. She denies any orthopnea. She denies, fever, chills, or unintended weight loss. She reports consistent use of her CPAP.  However, her primary reason for seeking emergency medical attention was a "dizziness," that she attributed to a COPD exacerbation. She feels as though she is "drunk" and is constantly following over. She also endorses a droning "factory sound" in her ears. She reports feeling like her mouth is very dry, and has been drinking lots of Fanta pineapple soda in an attempt to rehydrate. She denies any light-headedness and denies feeling like she might faint. She has no neurological complaints other than a a headache "behind her eyes." She denies any new onset weakness.   She has 4 ED visits recently (9/9, 9/9, 9/10, 9/11). On 9/11 she presented with light-headedness and substernal chest pain. Patient left AMA Troponins, CXR, and d-dimer were gative. On 9/10, p/w lower extremity pain, complaining of  knots in her legs. Patient p/w leg pain on 9/9, and was found to have a fracture of R 4th toe (pt had stubbed toe a month prior). At that time, had a negative DVT workup with a negative doppler on the right lower extremity. At CT scan from 9/8 showed left lobe lesion with ddx of neoplasm or infection focus, recommending CT w/o contrast in 1 month to follow-up.  In the ED, she was found to desat to 89% on RA while ambulating and become short of breath on ambulation. She had a leukocytosis to 16 without infiltrate or masses noted on 2-view CXR. She was given 125 mg IV solumedrol. She was found to be hyponatremic to 127 and given 1L NS.   Hospital Course by problem list: Principal Problem:   COPD exacerbation Active Problems:   OSA (obstructive sleep apnea)    Diabetes mellitus   Hypothyroidism   Renal failure (ARF), acute on chronic   Essential hypertension   Atypical pneumonia   1. COPD exacerbation : Ms Prigmore initially presented with expiratory wheezing and exertional  desaturation to 89% on room air with ambulation. She was started on a 5 day course of steroids. Will be discharged on day 3 of 5 and given a prescription for remainder two days. We also started her on Azithromycin to complete a 5 day course outpatient. Additionally, we added tiotropium daily and continued her home nebulizer regimen.  Smoking cessation was discussed at length. She will restart her nicotine patch that she has at home. Upon discharge she was able to ambulate halls while conversing without increase dyspnea or desaturation. She will need outpatient PFT's with lung volumes and DLCO.   2. Atypical Pneumonia : Her CT scan on 9/8 demonstrated multiple pulmonary nodules with possible infective characteristics. Given the largest nodule was to the right lung base and was consistent with her rib pain as well as her subjective chills, increase in productive cough she was given a 5 day course of azithromycin. She will be discharged on day 1 with a prescription for 4 more days.  She will need a repeat CT w/contrast in one month from original scan to further eval etiology of nodules. If no change, consider biopsy.   3. Hyponatremia and AKI on CKD 2/2 volume depletion: SCr of 1.22 improved to 0.91 after fluid resuscitation of 1 liter which also resolved her hyponatremia. Her HCTZ was discontinued.   4. Hypothyroidism/Grave's Disease : TSH 0.048, Cortisol 3.1 ; We continued her levothyroxine of 175 mcg. Recommend to re-evaluate levels after completion of prednisone course.   5. OSA : Continued CPAP qHS without issue  6. DM Type 2 : CBG ranged 97 - 181. Pt educated extensively on importance of checking glucose at home and compliance of medications.   7. Essential Hypertension :  SBP 100 - 112 after holding lisinopril during stay. At discharge we restarted lisinopril and discontinued the HCTZ in the setting of AKI and hyponatremia.   Discharge Vitals:   BP 104/63 mmHg  Pulse 81  Temp(Src) 97.6 F (36.4 C) (Axillary)  Resp 18  SpO2 94%  Discharge Labs:  Results for orders placed or performed during the hospital encounter of 12/16/14 (from the past 24 hour(s))  Glucose, capillary     Status: None   Collection Time: 12/17/14  9:47 PM  Result Value Ref Range   Glucose-Capillary 97 65 - 99 mg/dL  Basic metabolic panel     Status: Abnormal   Collection Time: 12/18/14  5:20 AM  Result Value Ref Range   Sodium 138 135 - 145 mmol/L   Potassium 4.0 3.5 - 5.1 mmol/L   Chloride 102 101 - 111 mmol/L   CO2 32 22 - 32 mmol/L   Glucose, Bld 127 (H) 65 - 99 mg/dL   BUN 14 6 - 20 mg/dL   Creatinine, Ser 3.71 0.44 - 1.00 mg/dL   Calcium 8.6 (L) 8.9 - 10.3 mg/dL   GFR calc non Af Amer >60 >60 mL/min   GFR calc Af Amer >60 >60 mL/min   Anion gap 4 (L) 5 - 15  HIV antibody     Status: None   Collection Time: 12/18/14  5:20 AM  Result Value Ref Range   HIV Screen 4th Generation wRfx Non Reactive Non Reactive  Magnesium     Status: None   Collection Time: 12/18/14  5:20 AM  Result Value Ref Range   Magnesium 1.8 1.7 - 2.4 mg/dL  CBC     Status: Abnormal   Collection Time: 12/18/14  5:20 AM  Result Value Ref Range   WBC 16.2 (H) 4.0 - 10.5 K/uL   RBC 3.75 (L) 3.87 - 5.11 MIL/uL   Hemoglobin 10.7 (L) 12.0 - 15.0 g/dL   HCT 69.6 (L) 78.9 - 38.1 %   MCV 89.6 78.0 - 100.0 fL   MCH 28.5 26.0 - 34.0 pg   MCHC 31.8 30.0 - 36.0 g/dL   RDW 01.7 51.0 - 25.8 %   Platelets 206 150 - 400 K/uL  Technologist smear review     Status: None   Collection Time: 12/18/14  5:20 AM  Result Value Ref Range   Tech Review MORPHOLOGY UNREMARKABLE   Glucose, capillary     Status: Abnormal   Collection Time: 12/18/14  7:27 AM  Result Value Ref Range   Glucose-Capillary 101 (H) 65 - 99  mg/dL  Glucose, capillary     Status: Abnormal   Collection Time: 12/18/14 12:45 PM  Result Value Ref Range   Glucose-Capillary 103 (H) 65 - 99 mg/dL   Comment 1 Notify RN     Signed: Valentino Nose, MD 12/18/2014, 7:10 PM

## 2014-12-19 LAB — HCV COMMENT:

## 2014-12-19 LAB — HEPATITIS C ANTIBODY (REFLEX): HCV Ab: <0.1 s/co ratio (ref 0.0–0.9)

## 2014-12-19 LAB — HEMOGLOBIN A1C
Hgb A1c MFr Bld: 6.9 % — ABNORMAL HIGH (ref 4.8–5.6)
Mean Plasma Glucose: 151 mg/dL

## 2014-12-21 ENCOUNTER — Emergency Department (INDEPENDENT_AMBULATORY_CARE_PROVIDER_SITE_OTHER)
Admission: EM | Admit: 2014-12-21 | Discharge: 2014-12-21 | Disposition: A | Discharge status: Home / Self Care | Payer: Medicaid Other | Source: Home / Self Care | Attending: Emergency Medicine | Admitting: Emergency Medicine

## 2014-12-21 ENCOUNTER — Encounter (HOSPITAL_COMMUNITY): Payer: Self-pay | Admitting: Emergency Medicine

## 2014-12-21 DIAGNOSIS — H1013 Acute atopic conjunctivitis, bilateral: Secondary | ICD-10-CM | POA: Diagnosis not present

## 2014-12-21 DIAGNOSIS — S301XXA Contusion of abdominal wall, initial encounter: Secondary | ICD-10-CM | POA: Diagnosis not present

## 2014-12-21 DIAGNOSIS — G8929 Other chronic pain: Secondary | ICD-10-CM

## 2014-12-21 DIAGNOSIS — M549 Dorsalgia, unspecified: Secondary | ICD-10-CM | POA: Diagnosis not present

## 2014-12-21 MED ORDER — HYDROCODONE-ACETAMINOPHEN 10-325 MG PO TABS: 1.0000 | ORAL_TABLET | Freq: Four times a day (QID) | ORAL | Status: DC | PRN

## 2014-12-21 MED ORDER — OLOPATADINE HCL 0.2 % OP SOLN: 1.0000 [drp] | Freq: Every day | OPHTHALMIC | Status: DC

## 2014-12-21 NOTE — ED Notes (Signed)
Reports she noticed a knot on left side of abdomen yest Painful (9/10)... Reports she was admitted to [facility] this week She is alert and oriented x4... No acute distress.

## 2014-12-21 NOTE — Discharge Instructions (Signed)
The tender spot on your abdomen is some bruising from the heparin shots in the hospital. Apply ice or cold compresses frequently. This will improve over the next few days.  I have provided 10 tablets of your pain medicine. Please follow-up with your doctor on Monday.  Use the Pataday eyedrops daily to help with your allergies.

## 2014-12-21 NOTE — ED Provider Notes (Signed)
CSN: [id number]     Arrival date & time 12/21/14  1303 History   First MD Initiated Contact with Patient 12/21/14 1320     Chief Complaint  Patient presents with  . Cyst   (Consider location/radiation/quality/duration/timing/severity/associated sxs/prior Treatment) HPI  She is a 59 year old woman here for a tender spot on her abdomen. She states she was just released from the hospital on Wednesday for a COPD exacerbation. She states they were giving her heparin shots while in the hospital, and now she has a sore nodule on her abdominal wall. The pain well radiate towards her back. She denies any fevers or chills. There is bruising at that location. She also states that she was supposed to see her doctor today, but he left early and she was unable to see him. She was supposed to get a pain medicine refill and she wanted to speak to him about an allergy shot. She reports itchy watery eyes and some bilateral eyelid swelling.  Past Medical History  Diagnosis Date  . Hypertension   . Heart murmur   . Asthma   . Emphysema   . Diabetes mellitus   . Hyperlipidemia   . Allergic rhinitis   . Chronic headache   . Neuropathy   . Grave's disease   . Herniated disc   . COPD (chronic obstructive pulmonary disease)   . Menopause   . Hernia, umbilical   . Anxiety   . Depression   . Back pain, chronic   . Shingles   . Herniated disc   . Arthritis     rheumatoid  . 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 Generalized anxiety disorder 05/26/2012  . Chronic back pain 05/27/2012    Secondary to L4-5 herniated disc per patient  . OSA (obstructive sleep apnea)     has a cpap  . Wears glasses   . Wears dentures     top  . Tear of lateral meniscus of right knee 10/13/2013  . Alcoholism   . Sleep apnea     wear c-pap  . Allergy   . Emphysema of lung   . GERD (gastroesophageal reflux disease)   . Osteoporosis    Past Surgical History    Procedure Laterality Date  . Cholecystectomy    . Tubal ligation    . Foot surgery      cyst rt foot  . Laparoscopy      age 75  . Eye surgery      eye back in socker-rt  . Knee arthroscopy with lateral menisectomy Right 10/13/2013    Procedure: RIGHT KNEE ARTHROSCOPY WITH PARTIAL LATERAL MENISECTOMY/DEBRIDEMENT/SHAVING ;  Surgeon: Eulas Post, MD;  Location: [facility];  Service: Orthopedics;  Laterality: Right;  . Left heart catheterization with coronary angiogram N/A 05/30/2012    Procedure: LEFT HEART CATHETERIZATION WITH CORONARY ANGIOGRAM;  Surgeon: [name], MD;  Location: [facility] CATH LAB;  Service: Cardiovascular;  Laterality: N/A;   Family History  Problem Relation Age of Onset  . Emphysema Mother   . Allergies Mother   . Asthma Mother   . Heart disease Mother   . Rheum arthritis Mother   . Diabetes Mother   . Kidney disease Mother   . Allergies Daughter   . Asthma Brother   . Breast cancer Other     aunt  . Lung cancer Brother   . Throat cancer Brother   .  Colon cancer Neg Hx   . Colon polyps Neg Hx   . Esophageal cancer Neg Hx   . Rectal cancer Neg Hx   . Stomach cancer Neg Hx    Social History  Substance Use Topics  . Smoking status: Current Every Day Smoker -- 1.00 packs/day for 40 years    Types: Cigarettes  . Smokeless tobacco: Never Used  . Alcohol Use: No   OB History    Gravida Para Term Preterm AB TAB SAB Ectopic Multiple Living   5 3   2  2   3      Review of Systems As in history of present illness Allergies  Ciprofloxacin; Fluocinolone; and Penicillins  Home Medications   Prior to Admission medications   Medication Sig Start Date End Date Taking? Authorizing Iana Buzan  acyclovir (ZOVIRAX) 400 MG tablet Take 400 mg by mouth 2 (two) times daily.    Yes Historical Jaquavis Felmlee, MD  albuterol (PROAIR HFA) 108 (90 BASE) MCG/ACT inhaler Inhale 2 puffs into the lungs 4 (four) times daily. For COPD   Yes Historical Natallie Ravenscroft, MD   atorvastatin (LIPITOR) 40 MG tablet Take 1 tablet (40 mg total) by mouth daily. 10/06/12  Yes 12/07/12, DO  azithromycin (ZITHROMAX) 250 MG tablet Take 1 tablet (250 mg total) by mouth daily. 12/18/14  Yes 12/20/14, MD  busPIRone (BUSPAR) 10 MG tablet Take 20 mg by mouth 3 (three) times daily.    Yes Historical Twain Stenseth, MD  FLUoxetine (PROZAC) 20 MG capsule Take 60 mg by mouth every morning.    Yes Historical Casha Estupinan, MD  glipiZIDE (GLUCOTROL) 10 MG tablet Take 10 mg by mouth 2 (two) times daily before a meal.   Yes Historical Shametra Cumberland, MD  hydrochlorothiazide (HYDRODIURIL) 25 MG tablet Take 25 mg by mouth every morning.    Yes Historical Jezlyn Westerfield, MD  ipratropium-albuterol (DUONEB) 0.5-2.5 (3) MG/3ML SOLN Take 3 mLs by nebulization every 6 (six) hours as needed (Asthma).   Yes Historical Rhyann Berton, MD  levothyroxine (SYNTHROID, LEVOTHROID) 175 MCG tablet Take 175 mcg by mouth every morning.    Yes Historical Deyvi Bonanno, MD  lisinopril (PRINIVIL,ZESTRIL) 10 MG tablet Take 10 mg by mouth daily.   Yes Historical Chalonda Schlatter, MD  loratadine (CLARITIN) 10 MG tablet Take 10 mg by mouth daily as needed for allergies.   Yes Historical Yurika Pereda, MD  meloxicam (MOBIC) 15 MG tablet Take 15 mg by mouth daily.   Yes Historical Taiylor Virden, MD  metFORMIN (GLUCOPHAGE) 1000 MG tablet Take 1,000 mg by mouth 2 (two) times daily.    Yes Historical Yoshi Mancillas, MD  methocarbamol (ROBAXIN) 500 MG tablet Take 1 tablet (500 mg total) by mouth 2 (two) times daily. 12/07/14  Yes 02/06/15, PA-C  predniSONE (DELTASONE) 20 MG tablet Take 2 tablets (40 mg total) by mouth daily with breakfast. 12/18/14  Yes 12/20/14, MD  pregabalin (LYRICA) 75 MG capsule Take 75 mg by mouth 3 (three) times daily.   Yes Historical Loris Seelye, MD  QUEtiapine (SEROQUEL) 25 MG tablet Take 50 mg by mouth at bedtime.    Yes Historical Darneisha Windhorst, MD  sennosides-docusate sodium (SENOKOT-S) 8.6-50 MG tablet Take 2 tablets by mouth daily. Patient  taking differently: Take 2 tablets by mouth daily as needed for constipation.  10/13/13  Yes 10/15/13, MD  tiotropium (SPIRIVA) 18 MCG inhalation capsule Place 1 capsule (18 mcg total) into inhaler and inhale daily. 12/18/14  Yes 12/20/14, MD  traMADol (ULTRAM) 50 MG tablet Take 50 mg by  mouth every 6 (six) hours as needed for moderate pain.  02/05/14  Yes Historical Kabrina Christiano, MD  verapamil (CALAN) 40 MG tablet Take 40 mg by mouth 3 (three) times daily.   Yes Historical Duran Ohern, MD  cycloSPORINE (RESTASIS) 0.05 % ophthalmic emulsion Place 1 drop into both eyes 2 (two) times daily.    Historical Kc Sedlak, MD  diclofenac sodium (VOLTAREN) 1 % GEL Apply 2 g topically 4 (four) times daily as needed (pain).     Historical Kailan Laws, MD  HYDROcodone-acetaminophen (NORCO) 10-325 MG per tablet Take 1 tablet by mouth every 6 (six) hours as needed for moderate pain. 12/21/14   Charm Rings, MD  Olopatadine HCl (PATADAY) 0.2 % SOLN Apply 1 drop to eye daily. 12/21/14   Charm Rings, MD   Meds Ordered and Administered this Visit  Medications - No data to display  BP 119/73 mmHg  Pulse 86  Temp(Src) 98.2 F (36.8 C) (Oral)  Resp 20  SpO2 98% No data found.   Physical Exam  Constitutional: She is oriented to person, place, and time. She appears well-developed and well-nourished. No distress.  Eyes: Conjunctivae are normal.  Mild eyelid swelling bilaterally.  Neck: Neck supple.  Cardiovascular: Normal rate.   Pulmonary/Chest: Effort normal.  Abdominal:    Neurological: She is alert and oriented to person, place, and time.    ED Course  Procedures (including critical care time)  Labs Review Labs Reviewed - No data to display  Imaging Review No results found.   MDM   1. Superficial bruising of abdominal wall, initial encounter   2. Allergic conjunctivitis, bilateral    Ice and cold compresses for the bruising. Pataday for eye allergy symptoms. I provided a prescription for 10  tablets of her Norco. She will follow-up with her primary care doctor on Monday.    Charm Rings, MD 12/21/14 [phone]

## 2015-01-21 ENCOUNTER — Ambulatory Visit (INDEPENDENT_AMBULATORY_CARE_PROVIDER_SITE_OTHER): Payer: Medicaid Other | Admitting: Pulmonary Disease

## 2015-01-21 ENCOUNTER — Encounter: Payer: Self-pay | Admitting: Pulmonary Disease

## 2015-01-21 VITALS — BP 126/88 | HR 92 | Temp 98.5°F

## 2015-01-21 DIAGNOSIS — J45909 Unspecified asthma, uncomplicated: Secondary | ICD-10-CM

## 2015-01-21 DIAGNOSIS — J4489 Other specified chronic obstructive pulmonary disease: Secondary | ICD-10-CM

## 2015-01-21 DIAGNOSIS — Z9989 Dependence on other enabling machines and devices: Principal | ICD-10-CM

## 2015-01-21 DIAGNOSIS — Z720 Tobacco use: Secondary | ICD-10-CM

## 2015-01-21 DIAGNOSIS — R918 Other nonspecific abnormal finding of lung field: Secondary | ICD-10-CM

## 2015-01-21 DIAGNOSIS — G4733 Obstructive sleep apnea (adult) (pediatric): Secondary | ICD-10-CM | POA: Diagnosis not present

## 2015-01-21 DIAGNOSIS — J449 Chronic obstructive pulmonary disease, unspecified: Secondary | ICD-10-CM | POA: Diagnosis not present

## 2015-01-21 LAB — PULMONARY FUNCTION TEST
DL/VA % pred: 93 %
DL/VA: 4.9 ml/min/mmHg/L
DLCO UNC % PRED: 66 %
DLCO unc: 19.63 ml/min/mmHg
FEF 25-75 PRE: 1.79 L/s
FEF 25-75 Post: 3.19 L/sec
FEF2575-%CHANGE-POST: 78 %
FEF2575-%Pred-Post: 129 %
FEF2575-%Pred-Pre: 72 %
FEV1-%CHANGE-POST: 14 %
FEV1-%PRED-POST: 75 %
FEV1-%Pred-Pre: 66 %
FEV1-POST: 1.91 L
FEV1-PRE: 1.67 L
FEV1FVC-%Change-Post: 3 %
FEV1FVC-%Pred-Pre: 103 %
FEV6-%Change-Post: 10 %
FEV6-%PRED-POST: 72 %
FEV6-%PRED-PRE: 65 %
FEV6-PRE: 2.01 L
FEV6-Post: 2.23 L
FEV6FVC-%PRED-PRE: 103 %
FEV6FVC-%Pred-Post: 103 %
FVC-%CHANGE-POST: 10 %
FVC-%Pred-Post: 70 %
FVC-%Pred-Pre: 63 %
FVC-Post: 2.23 L
FVC-Pre: 2.01 L
POST FEV1/FVC RATIO: 86 %
PRE FEV6/FVC RATIO: 100 %
Post FEV6/FVC ratio: 100 %
Pre FEV1/FVC ratio: 83 %
RV % PRED: 72 %
RV: 1.55 L
TLC % pred: 73 %
TLC: 4.15 L

## 2015-01-21 NOTE — Progress Notes (Signed)
PFT done today. 

## 2015-01-21 NOTE — Progress Notes (Signed)
Chief Complaint  Patient presents with  . Follow-up    pt following for COPD and OSA with a PFT: pt has a concern with her coughing. pt states the mask for her CPAP is drying out her mouth, she wakes up coughing and gaspping. pt c/o eye swelling, and chest pains all the time. DME: [facility]    History of Present Illness: Vanessa [name] is a 59 y.o. female smoker with OSA and COPD.  She is here to review her PFT.  This showed mild restriction and diffusion defect, and positive bronchodilator response.  She has been getting mask leak from CPAP and this is making it difficult for her to use CPAP.  She still smokes.  She still has hoarseness.  She gets cough, wheeze, and sputum.  She was in hospital with "walking pneumonia".  She was started on spiriva during hospital stay and this has helped.     TESTS: PSG 02/15/06 > AHI 6 Echo 07/24/13 >> EF 55 to 60%, grade 1 diastolic dysfx, mild AI CT chest 12/06/14 >> 1.5 cm irregular nodular lesion LUL, 1.5 mm nodule Rt lung base, 3 mm nodule Rt lung base, 2 mm nodule Rt mid lung region PFT 01/21/15 >> FEV1 1.91 (75%), FEV1% 86, TLC 4.15 (73%), DLCO 66%, + BD   PMhx >> HTN, DM, HLD, HA, Neuropathy, Grave's disease with hypothyroidism, Anxiety, Depression, Chronic back pain, Rheumatoid arthritis, GERD, COPD/asthma  Past surgical hx, Medications, Allergies, Family hx, Social hx all reviewed.   Physical Exam: BP 126/88 mmHg  Pulse 92  Temp(Src) 98.5 F (36.9 C) (Oral)  SpO2 98%  General - No distress ENT - No sinus tenderness, no oral exudate, no LAN, raspy voice Cardiac - s1s2 regular, no murmur Chest - No wheeze/rales/dullness Back - No focal tenderness Abd - Soft, non-tender Ext - No edema Neuro - Normal strength Skin - No rashes Psych - normal mood, and behavior   Assessment/Plan:  Obstructive sleep apnea. Plan: - will get her set up for chin strap - she has sleep study scheduled for next week >> will call her with  results  Lung nodules. Plan: - will arrange for f/u CT chest w/o contrast  COPD with chronic bronchitis. Plan: - continue spiriva - continue prn proair and duoneb  Tobacco abuse. Plan: - continue bupropion, nicotine replacement  Obesity. Plan: - discussed importance of weight loss   Coralyn Helling, MD [facility] Pager:  [phone]

## 2015-01-21 NOTE — Patient Instructions (Signed)
Will arrange for CT chest w/o contrast Will arrange for chin strap for CPAP mask Will call with results of sleep study  Follow up in 4 months

## 2015-01-23 ENCOUNTER — Ambulatory Visit (INDEPENDENT_AMBULATORY_CARE_PROVIDER_SITE_OTHER)
Admission: RE | Admit: 2015-01-23 | Discharge: 2015-01-23 | Disposition: A | Discharge status: Home / Self Care | Payer: Medicaid Other | Source: Ambulatory Visit | Attending: Pulmonary Disease | Admitting: Pulmonary Disease

## 2015-01-23 DIAGNOSIS — R918 Other nonspecific abnormal finding of lung field: Secondary | ICD-10-CM | POA: Diagnosis not present

## 2015-01-24 ENCOUNTER — Inpatient Hospital Stay: Admission: RE | Admit: 2015-01-24 | Payer: Medicaid Other | Source: Ambulatory Visit

## 2015-01-24 ENCOUNTER — Telehealth: Payer: Self-pay | Admitting: Pulmonary Disease

## 2015-01-24 NOTE — Telephone Encounter (Signed)
CT chest 01/23/15 >> decreased size LUL GGO, resolved RUL and RLL nodules   Results d/w pt.  Explained that CT chest looks better, and likely benign lesions.  No need for additional radiographic follow up.

## 2015-01-31 ENCOUNTER — Ambulatory Visit (HOSPITAL_BASED_OUTPATIENT_CLINIC_OR_DEPARTMENT_OTHER): Payer: Medicaid Other | Attending: Pulmonary Disease | Admitting: Radiology

## 2015-01-31 DIAGNOSIS — R5383 Other fatigue: Secondary | ICD-10-CM | POA: Insufficient documentation

## 2015-01-31 DIAGNOSIS — I493 Ventricular premature depolarization: Secondary | ICD-10-CM | POA: Diagnosis not present

## 2015-01-31 DIAGNOSIS — E669 Obesity, unspecified: Secondary | ICD-10-CM | POA: Diagnosis not present

## 2015-01-31 DIAGNOSIS — G4733 Obstructive sleep apnea (adult) (pediatric): Secondary | ICD-10-CM | POA: Insufficient documentation

## 2015-01-31 DIAGNOSIS — Z6834 Body mass index (BMI) 34.0-34.9, adult: Secondary | ICD-10-CM | POA: Insufficient documentation

## 2015-01-31 DIAGNOSIS — I10 Essential (primary) hypertension: Secondary | ICD-10-CM | POA: Insufficient documentation

## 2015-01-31 DIAGNOSIS — E119 Type 2 diabetes mellitus without complications: Secondary | ICD-10-CM | POA: Insufficient documentation

## 2015-01-31 DIAGNOSIS — R0683 Snoring: Secondary | ICD-10-CM | POA: Insufficient documentation

## 2015-01-31 DIAGNOSIS — G4719 Other hypersomnia: Secondary | ICD-10-CM | POA: Diagnosis present

## 2015-02-11 ENCOUNTER — Telehealth: Payer: Self-pay | Admitting: Pulmonary Disease

## 2015-02-11 DIAGNOSIS — G4733 Obstructive sleep apnea (adult) (pediatric): Secondary | ICD-10-CM

## 2015-02-11 NOTE — Progress Notes (Signed)
Patient Name: Vanessa [name], Nolasco Date: 01/31/2015 Gender: Female D.O.B: Oct 21, 1955 Age (years): 42 Referring Provider: Coralyn Helling MD, ABSM Height (inches): 68 Interpreting Physician: Coralyn Helling MD, ABSM Weight (lbs): 225 RPSGT: Shelah Lewandowsky BMI: 34 MRN: [id number] Neck Size: 18.50  CLINICAL INFORMATION Sleep Study Type: NPSG Indication for sleep study: Diabetes, Excessive Daytime Sleepiness, Fatigue, Hypertension, Obesity, OSA, Re-Evaluation, Snoring, Witnessed Apneas Epworth Sleepiness Score: 7  Most recent polysomnogram dated 02/15/2006 revealed RDI of 5.6/h. Most recent titration study dated 11/22/2011 was optimal at 11cm H2O with an AHI of 0/h.  SLEEP STUDY TECHNIQUE As per the AASM Manual for the Scoring of Sleep and Associated Events v2.3 (April 2016) with a hypopnea requiring 4% desaturations. The channels recorded and monitored were frontal, central and occipital EEG, electrooculogram (EOG), submentalis EMG (chin), nasal and oral airflow, thoracic and abdominal wall motion, anterior tibialis EMG, snore microphone, electrocardiogram, and pulse oximetry.  MEDICATIONS Patient's medications include: reviewed in electronic medical record. Medications self-administered by patient during sleep study : No sleep medicine administered.  SLEEP ARCHITECTURE The study was initiated at 11:12:14 PM and ended at 5:19:01 AM. Sleep onset time was 2.7 minutes and the sleep efficiency was 84.5%. The total sleep time was 310.0 minutes. Stage REM latency was 248.0 minutes. The patient spent 29.03% of the night in stage N1 sleep, 65.81% in stage N2 sleep, 0.00% in stage N3 and 5.16% in REM. Alpha intrusion was absent. Supine sleep was 29.04%.  RESPIRATORY PARAMETERS The overall apnea/hypopnea index (AHI) was 40.3 per hour. There were 196 total apneas, including 181 obstructive, 12 central and 3 mixed apneas. There were 12 hypopneas and 31 RERAs. The AHI during Stage REM sleep was 7.5  per hour. AHI while supine was 78.0 per hour. The mean oxygen saturation was 92.73%. The minimum SpO2 during sleep was 83.00%. Loud snoring was noted during this study.  CARDIAC DATA The 2 lead EKG demonstrated sinus rhythm. The mean heart rate was 83.81 beats per minute. Other EKG findings include: PVCs.  LEG MOVEMENT DATA The total PLMS were 1 with a resulting PLMS index of 0.19. Associated arousal with leg movement index was 0.2 .  IMPRESSIONS This study shows severe obstructive sleep apnea with an AHI of 40.3 and SaO2 low of 83%.   DIAGNOSIS - Obstructive Sleep Apnea (327.23 [G47.33 ICD-10])  RECOMMENDATIONS Continue CPAP therapy.   Coralyn Helling, MD, ABSM Diplomate, American Board of Sleep Medicine 02/11/2015, 4:32 PM  NPI: [id number]

## 2015-02-11 NOTE — Telephone Encounter (Signed)
PSG 01/31/15 >> AHI 40.3, SaO2 low 83%.  Will have my nurse inform pt that sleep study shows severe sleep apnea.  She should continue using CPAP.

## 2015-02-13 NOTE — Telephone Encounter (Signed)
Spoke with pt and notified of results per Dr. Craige Cotta. Pt verbalized understanding and denied any questions. She states that she is unable to use her CPAP b/c they changed her mask and hose and it will not fit her old machine  I have sent order to [facility] to see about getting this resolved with [facility]

## 2015-04-14 ENCOUNTER — Encounter (HOSPITAL_COMMUNITY): Payer: Self-pay | Admitting: *Deleted

## 2015-04-14 ENCOUNTER — Emergency Department (INDEPENDENT_AMBULATORY_CARE_PROVIDER_SITE_OTHER): Payer: Medicaid Other

## 2015-04-14 ENCOUNTER — Emergency Department (INDEPENDENT_AMBULATORY_CARE_PROVIDER_SITE_OTHER)
Admission: EM | Admit: 2015-04-14 | Discharge: 2015-04-14 | Disposition: A | Discharge status: Home / Self Care | Payer: Medicaid Other | Source: Home / Self Care | Attending: Emergency Medicine | Admitting: Emergency Medicine

## 2015-04-14 DIAGNOSIS — M62838 Other muscle spasm: Secondary | ICD-10-CM

## 2015-04-14 DIAGNOSIS — M6248 Contracture of muscle, other site: Secondary | ICD-10-CM

## 2015-04-14 HISTORY — DX: Rheumatoid arthritis, unspecified: M06.9

## 2015-04-14 MED ORDER — CYCLOBENZAPRINE HCL 10 MG PO TABS: ORAL_TABLET | ORAL | Status: DC

## 2015-04-14 MED ORDER — HYDROCODONE-ACETAMINOPHEN 5-325 MG PO TABS: 2.0000 | ORAL_TABLET | Freq: Four times a day (QID) | ORAL | Status: DC | PRN

## 2015-04-14 NOTE — ED Provider Notes (Signed)
CSN: [id number]     Arrival date & time 04/14/15  1724 History   First MD Initiated Contact with Patient 04/14/15 1807     Chief Complaint  Patient presents with  . Neck Pain  . Arm Pain   (Consider location/radiation/quality/duration/timing/severity/associated sxs/prior Treatment) HPI Pt has left arm and shoulder pain with a "knot" in her neck. Neck sxs about 1 week, left hand and arm 3 days. Symptoms are constant worse with movement. history of disability due to arthritis and bulging discs. Unable to see PCP away at funeral. Past Medical History  Diagnosis Date  . Hypertension   . Heart murmur   . Asthma   . Emphysema   . Diabetes mellitus   . Hyperlipidemia   . Allergic rhinitis   . Chronic headache   . Neuropathy (HCC)   . Grave's disease   . Herniated disc   . COPD (chronic obstructive pulmonary disease) (HCC)   . Menopause   . Hernia, umbilical   . Anxiety   . Depression   . Back pain, chronic   . Shingles   . Herniated disc   . Arthritis     rheumatoid  . 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 Generalized anxiety disorder 05/26/2012  . Chronic back pain 05/27/2012    Secondary to L4-5 herniated disc per patient  . OSA (obstructive sleep apnea)     has a cpap  . Wears glasses   . Wears dentures     top  . Tear of lateral meniscus of right knee 10/13/2013  . Alcoholism (HCC)   . Sleep apnea     wear c-pap  . Allergy   . Emphysema of lung (HCC)   . GERD (gastroesophageal reflux disease)   . Osteoporosis    Past Surgical History  Procedure Laterality Date  . Cholecystectomy    . Tubal ligation    . Foot surgery      cyst rt foot  . Laparoscopy      age 28  . Eye surgery      eye back in socker-rt  . Knee arthroscopy with lateral menisectomy Right 10/13/2013    Procedure: RIGHT KNEE ARTHROSCOPY WITH PARTIAL LATERAL MENISECTOMY/DEBRIDEMENT/SHAVING ;  Surgeon: Eulas Post, MD;  Location:  [facility];  Service: Orthopedics;  Laterality: Right;  . Left heart catheterization with coronary angiogram N/A 05/30/2012    Procedure: LEFT HEART CATHETERIZATION WITH CORONARY ANGIOGRAM;  Surgeon: [name], MD;  Location: [facility] CATH LAB;  Service: Cardiovascular;  Laterality: N/A;   Family History  Problem Relation Age of Onset  . Emphysema Mother   . Allergies Mother   . Asthma Mother   . Heart disease Mother   . Rheum arthritis Mother   . Diabetes Mother   . Kidney disease Mother   . Allergies Daughter   . Asthma Brother   . Breast cancer Other     aunt  . Lung cancer Brother   . Throat cancer Brother   . Colon cancer Neg Hx   . Colon polyps Neg Hx   . Esophageal cancer Neg Hx   . Rectal cancer Neg Hx   . Stomach cancer Neg Hx    Social History  Substance Use Topics  . Smoking status: Current Every Day Smoker -- 1.00 packs/day for 40 years    Types: Cigarettes  . Smokeless tobacco: Never Used  . Alcohol Use: No  OB History    Gravida Para Term Preterm AB TAB SAB Ectopic Multiple Living   5 3   2  2   3      Review of Systems ROS +'ve back and wrist pain  Denies: HEADACHE, NAUSEA, ABDOMINAL PAIN, CHEST PAIN, CONGESTION, DYSURIA, SHORTNESS OF BREATH  Allergies  Ciprofloxacin; Fluocinolone; and Penicillins  Home Medications   Prior to Admission medications   Medication Sig Start Date End Date Taking? Authorizing Provider  acyclovir (ZOVIRAX) 400 MG tablet Take 400 mg by mouth 2 (two) times daily.    Yes Historical Provider, MD  albuterol (PROAIR HFA) 108 (90 BASE) MCG/ACT inhaler Inhale 2 puffs into the lungs 4 (four) times daily. For COPD   Yes Historical Provider, MD  atorvastatin (LIPITOR) 40 MG tablet Take 1 tablet (40 mg total) by mouth daily. 10/06/12  Yes 12/07/12, DO  BuPROPion HCl (WELLBUTRIN PO) Take 1 tablet by mouth 2 (two) times daily.   Yes Historical Provider, MD  busPIRone (BUSPAR) 10 MG tablet Take 20 mg by mouth 3  (three) times daily.    Yes Historical Provider, MD  cycloSPORINE (RESTASIS) 0.05 % ophthalmic emulsion Place 1 drop into both eyes 2 (two) times daily.   Yes Historical Provider, MD  diclofenac sodium (VOLTAREN) 1 % GEL Apply 2 g topically 4 (four) times daily as needed (pain).    Yes Historical Provider, MD  FLUoxetine (PROZAC) 20 MG capsule Take 60 mg by mouth every morning.    Yes Historical Provider, MD  glipiZIDE (GLUCOTROL) 10 MG tablet Take 10 mg by mouth 2 (two) times daily before a meal.   Yes Historical Provider, MD  HYDROcodone-acetaminophen (NORCO) 10-325 MG per tablet Take 1 tablet by mouth every 6 (six) hours as needed for moderate pain. 12/21/14  Yes 12/23/14, MD  ipratropium-albuterol (DUONEB) 0.5-2.5 (3) MG/3ML SOLN Take 3 mLs by nebulization every 6 (six) hours as needed (Asthma).   Yes Historical Provider, MD  levothyroxine (SYNTHROID, LEVOTHROID) 175 MCG tablet Take 175 mcg by mouth every morning.    Yes Historical Provider, MD  lisinopril (PRINIVIL,ZESTRIL) 10 MG tablet Take 10 mg by mouth daily.   Yes Historical Provider, MD  loratadine (CLARITIN) 10 MG tablet Take 10 mg by mouth daily as needed for allergies.   Yes Historical Provider, MD  meloxicam (MOBIC) 15 MG tablet Take 15 mg by mouth daily.   Yes Historical Provider, MD  metFORMIN (GLUCOPHAGE) 1000 MG tablet Take 1,000 mg by mouth 2 (two) times daily.    Yes Historical Provider, MD  Olopatadine HCl (PATADAY) 0.2 % SOLN Apply 1 drop to eye daily. 12/21/14  Yes 12/23/14, MD  pregabalin (LYRICA) 75 MG capsule Take 75 mg by mouth 3 (three) times daily.   Yes Historical Provider, MD  QUEtiapine (SEROQUEL) 25 MG tablet Take 50 mg by mouth at bedtime.    Yes Historical Provider, MD  sennosides-docusate sodium (SENOKOT-S) 8.6-50 MG tablet Take 2 tablets by mouth daily. Patient taking differently: Take 2 tablets by mouth daily as needed for constipation.  10/13/13  Yes 10/15/13, MD  tiotropium (SPIRIVA) 18 MCG  inhalation capsule Place 1 capsule (18 mcg total) into inhaler and inhale daily. 12/18/14  Yes 12/20/14, MD  traMADol (ULTRAM) 50 MG tablet Take 50 mg by mouth every 6 (six) hours as needed for moderate pain.  02/05/14  Yes Historical Provider, MD  verapamil (CALAN) 40 MG tablet Take 40 mg by mouth 3 (three) times  daily.   Yes Historical Provider, MD  methocarbamol (ROBAXIN) 500 MG tablet Take 1 tablet (500 mg total) by mouth 2 (two) times daily. 12/07/14   Fayrene Helper, PA-C   Meds Ordered and Administered this Visit  Medications - No data to display  There were no vitals taken for this visit. No data found.   Physical Exam  Constitutional: She is oriented to person, place, and time. She appears well-developed and well-nourished.  HENT:  Head: Atraumatic.  Neck: Muscular tenderness present.    Pulmonary/Chest: Effort normal.  Musculoskeletal: Normal range of motion.  Neurological: She is alert and oriented to person, place, and time.  Skin: Skin is warm and dry.  Psychiatric: She has a normal mood and affect. Her behavior is normal. Thought content normal.  Vitals reviewed.   ED Course  Procedures (including critical care time)  Labs Review Labs Reviewed - No data to display  Imaging Review Dg Cervical Spine Complete  04/14/2015  CLINICAL DATA:  Palpable knot in the back of neck with some swelling and pain. No known injury. EXAM: CERVICAL SPINE - COMPLETE 4+ VIEW COMPARISON:  None. FINDINGS: Examination is mildly limited by body habitus. The prevertebral soft tissues are normal. The alignment is anatomic through T1. There is no evidence of acute fracture or traumatic subluxation. The C1-2 articulation appears normal in the AP projection. There is disc space loss and uncinate spurring at C4-5, C5-6 and C6-7. Right foraminal assessment limited by obliquity. IMPRESSION: No acute osseous findings or malalignment.  Mild spondylosis. Electronically Signed   By: Carey Bullocks M.D.    On: 04/14/2015 18:59     Visual Acuity Review  Right Eye Distance:   Left Eye Distance:   Bilateral Distance:    Right Eye Near:   Left Eye Near:    Bilateral Near:         MDM   1. Muscle spasms of head or neck    Review of xrays with patient and treatment plan formed Patient is advised to continue home symptomatic treatment. Prescription for norco,flexeril sent pharmacy patient has indicated. Patient is advised that if there are new or worsening symptoms or attend the emergency department, or contact primary care provider. Instructions of care provided discharged home in stable condition.  THIS NOTE WAS GENERATED USING A VOICE RECOGNITION SOFTWARE PROGRAM. ALL REASONABLE EFFORTS  WERE MADE TO PROOFREAD THIS DOCUMENT FOR ACCURACY.     Tharon Aquas, PA 04/15/15 [phone]

## 2015-04-14 NOTE — ED Notes (Signed)
Denies injury or unusual activity.  Over past week, has had bilat wrist pain with hand swelling along with numbness in tingling during night.  Has some right lateral neck pain radiating down into right shoulder and RUE.  Was supposed to see PCP 2 days ago, but PCP had a death in the family.  Pt out of her pain meds now.  Also noticed a large "knot" to posterior neck area today.  Area is very swollen; has pain in area but is not particularly tender to palpation.

## 2015-04-14 NOTE — Discharge Instructions (Signed)
Heat Therapy Heat therapy can help ease sore, stiff, injured, and tight muscles and joints. Heat relaxes your muscles, which may help ease your pain.  RISKS AND COMPLICATIONS If you have any of the following conditions, do not use heat therapy unless your health care provider has approved:  Poor circulation.  Healing wounds or scarred skin in the area being treated.  Diabetes, heart disease, or high blood pressure.  Not being able to feel (numbness) the area being treated.  Unusual swelling of the area being treated.  Active infections.  Blood clots.  Cancer.  Inability to communicate pain. This may include young children and people who have problems with their brain function (dementia).  Pregnancy. Heat therapy should only be used on old, pre-existing, or long-lasting (chronic) injuries. Do not use heat therapy on new injuries unless directed by your health care provider. HOW TO USE HEAT THERAPY There are several different kinds of heat therapy, including:  Moist heat pack.  Warm water bath.  Hot water bottle.  Electric heating pad.  Heated gel pack.  Heated wrap.  Electric heating pad. Use the heat therapy method suggested by your health care provider. Follow your health care provider's instructions on when and how to use heat therapy. GENERAL HEAT THERAPY RECOMMENDATIONS  Do not sleep while using heat therapy. Only use heat therapy while you are awake.  Your skin may turn pink while using heat therapy. Do not use heat therapy if your skin turns red.  Do not use heat therapy if you have new pain.  High heat or long exposure to heat can cause burns. Be careful when using heat therapy to avoid burning your skin.  Do not use heat therapy on areas of your skin that are already irritated, such as with a rash or sunburn. SEEK MEDICAL CARE IF:  You have blisters, redness, swelling, or numbness.  You have new pain.  Your pain is worse. MAKE SURE  YOU:  Understand these instructions.  Will watch your condition.  Will get help right away if you are not doing well or get worse.   This information is not intended to replace advice given to you by your health care provider. Make sure you discuss any questions you have with your health care provider.   Document Released: 06/08/2011 Document Revised: 04/06/2014 Document Reviewed: 05/09/2013 Elsevier Interactive Patient Education 2016 [employer] Back Pain, Adult Back pain is very common in adults.The cause of back pain is rarely dangerous and the pain often gets better over time.The cause of your back pain may not be known. Some common causes of back pain include:  Strain of the muscles or ligaments supporting the spine.  Wear and tear (degeneration) of the spinal disks.  Arthritis.  Direct injury to the back. For many people, back pain may return. Since back pain is rarely dangerous, most people can learn to manage this condition on their own. HOME CARE INSTRUCTIONS Watch your back pain for any changes. The following actions may help to lessen any discomfort you are feeling:  Remain active. It is stressful on your back to sit or stand in one place for long periods of time. Do not sit, drive, or stand in one place for more than 30 minutes at a time. Take short walks on even surfaces as soon as you are able.Try to increase the length of time you walk each day.  Exercise regularly as directed by your health care provider. Exercise helps your back heal faster. It also  helps avoid future injury by keeping your muscles strong and flexible.  Do not stay in bed.Resting more than 1-2 days can delay your recovery.  Pay attention to your body when you bend and lift. The most comfortable positions are those that put less stress on your recovering back. Always use proper lifting techniques, including:  Bending your knees.  Keeping the load close to your body.  Avoiding  twisting.  Find a comfortable position to sleep. Use a firm mattress and lie on your side with your knees slightly bent. If you lie on your back, put a pillow under your knees.  Avoid feeling anxious or stressed.Stress increases muscle tension and can worsen back pain.It is important to recognize when you are anxious or stressed and learn ways to manage it, such as with exercise.  Take medicines only as directed by your health care provider. Over-the-counter medicines to reduce pain and inflammation are often the most helpful.Your health care provider may prescribe muscle relaxant drugs.These medicines help dull your pain so you can more quickly return to your normal activities and healthy exercise.  Apply ice to the injured area:  Put ice in a plastic bag.  Place a towel between your skin and the bag.  Leave the ice on for 20 minutes, 2-3 times a day for the first 2-3 days. After that, ice and heat may be alternated to reduce pain and spasms.  Maintain a healthy weight. Excess weight puts extra stress on your back and makes it difficult to maintain good posture. SEEK MEDICAL CARE IF:  You have pain that is not relieved with rest or medicine.  You have increasing pain going down into the legs or buttocks.  You have pain that does not improve in one week.  You have night pain.  You lose weight.  You have a fever or chills. SEEK IMMEDIATE MEDICAL CARE IF:   You develop new bowel or bladder control problems.  You have unusual weakness or numbness in your arms or legs.  You develop nausea or vomiting.  You develop abdominal pain.  You feel faint.   This information is not intended to replace advice given to you by your health care provider. Make sure you discuss any questions you have with your health care provider.   Document Released: 03/16/2005 Document Revised: 04/06/2014 Document Reviewed: 07/18/2013 Elsevier Interactive Patient Education [employer]

## 2015-05-07 ENCOUNTER — Encounter (HOSPITAL_COMMUNITY): Payer: Self-pay

## 2015-05-07 ENCOUNTER — Emergency Department (HOSPITAL_COMMUNITY)
Admission: EM | Admit: 2015-05-07 | Discharge: 2015-05-07 | Disposition: A | Discharge status: Home / Self Care | Payer: Medicaid Other | Attending: Emergency Medicine | Admitting: Emergency Medicine

## 2015-05-07 DIAGNOSIS — Z7984 Long term (current) use of oral hypoglycemic drugs: Secondary | ICD-10-CM | POA: Diagnosis not present

## 2015-05-07 DIAGNOSIS — G629 Polyneuropathy, unspecified: Secondary | ICD-10-CM | POA: Insufficient documentation

## 2015-05-07 DIAGNOSIS — Z87828 Personal history of other (healed) physical injury and trauma: Secondary | ICD-10-CM | POA: Diagnosis not present

## 2015-05-07 DIAGNOSIS — Z9981 Dependence on supplemental oxygen: Secondary | ICD-10-CM | POA: Insufficient documentation

## 2015-05-07 DIAGNOSIS — M2550 Pain in unspecified joint: Secondary | ICD-10-CM

## 2015-05-07 DIAGNOSIS — K219 Gastro-esophageal reflux disease without esophagitis: Secondary | ICD-10-CM | POA: Diagnosis not present

## 2015-05-07 DIAGNOSIS — E0500 Thyrotoxicosis with diffuse goiter without thyrotoxic crisis or storm: Secondary | ICD-10-CM | POA: Diagnosis not present

## 2015-05-07 DIAGNOSIS — Z791 Long term (current) use of non-steroidal anti-inflammatories (NSAID): Secondary | ICD-10-CM | POA: Insufficient documentation

## 2015-05-07 DIAGNOSIS — Z8619 Personal history of other infectious and parasitic diseases: Secondary | ICD-10-CM | POA: Insufficient documentation

## 2015-05-07 DIAGNOSIS — I10 Essential (primary) hypertension: Secondary | ICD-10-CM | POA: Diagnosis not present

## 2015-05-07 DIAGNOSIS — J439 Emphysema, unspecified: Secondary | ICD-10-CM | POA: Insufficient documentation

## 2015-05-07 DIAGNOSIS — R011 Cardiac murmur, unspecified: Secondary | ICD-10-CM | POA: Diagnosis not present

## 2015-05-07 DIAGNOSIS — F17210 Nicotine dependence, cigarettes, uncomplicated: Secondary | ICD-10-CM | POA: Diagnosis not present

## 2015-05-07 DIAGNOSIS — G4733 Obstructive sleep apnea (adult) (pediatric): Secondary | ICD-10-CM | POA: Insufficient documentation

## 2015-05-07 DIAGNOSIS — G8929 Other chronic pain: Secondary | ICD-10-CM | POA: Diagnosis not present

## 2015-05-07 DIAGNOSIS — E785 Hyperlipidemia, unspecified: Secondary | ICD-10-CM | POA: Insufficient documentation

## 2015-05-07 DIAGNOSIS — F411 Generalized anxiety disorder: Secondary | ICD-10-CM | POA: Insufficient documentation

## 2015-05-07 DIAGNOSIS — M542 Cervicalgia: Secondary | ICD-10-CM | POA: Diagnosis not present

## 2015-05-07 DIAGNOSIS — E119 Type 2 diabetes mellitus without complications: Secondary | ICD-10-CM | POA: Diagnosis not present

## 2015-05-07 DIAGNOSIS — Z880 Allergy status to penicillin: Secondary | ICD-10-CM | POA: Diagnosis not present

## 2015-05-07 DIAGNOSIS — E669 Obesity, unspecified: Secondary | ICD-10-CM | POA: Diagnosis not present

## 2015-05-07 DIAGNOSIS — Z8742 Personal history of other diseases of the female genital tract: Secondary | ICD-10-CM | POA: Insufficient documentation

## 2015-05-07 DIAGNOSIS — Z9889 Other specified postprocedural states: Secondary | ICD-10-CM | POA: Insufficient documentation

## 2015-05-07 DIAGNOSIS — Z79899 Other long term (current) drug therapy: Secondary | ICD-10-CM | POA: Insufficient documentation

## 2015-05-07 DIAGNOSIS — M25532 Pain in left wrist: Secondary | ICD-10-CM | POA: Diagnosis present

## 2015-05-07 DIAGNOSIS — M069 Rheumatoid arthritis, unspecified: Secondary | ICD-10-CM | POA: Diagnosis not present

## 2015-05-07 MED ORDER — KETOROLAC TROMETHAMINE 60 MG/2ML IM SOLN
60.0000 mg | Freq: Once | INTRAMUSCULAR | Status: AC
  Administered 2015-05-07: 60 mg via INTRAMUSCULAR
  Filled 2015-05-07: qty 2

## 2015-05-07 MED ORDER — METHOCARBAMOL 500 MG PO TABS
1000.0000 mg | ORAL_TABLET | Freq: Once | ORAL | Status: AC
  Administered 2015-05-07: 1000 mg via ORAL
  Filled 2015-05-07: qty 2

## 2015-05-07 NOTE — Discharge Instructions (Signed)
Heat Therapy °Heat therapy can help ease sore, stiff, injured, and tight muscles and joints. Heat relaxes your muscles, which may help ease your pain.  °RISKS AND COMPLICATIONS °If you have any of the following conditions, do not use heat therapy unless your health care provider has approved: °· Poor circulation. °· Healing wounds or scarred skin in the area being treated. °· Diabetes, heart disease, or high blood pressure. °· Not being able to feel (numbness) the area being treated. °· Unusual swelling of the area being treated. °· Active infections. °· Blood clots. °· Cancer. °· Inability to communicate pain. This may include young children and people who have problems with their brain function (dementia). °· Pregnancy. °Heat therapy should only be used on old, pre-existing, or long-lasting (chronic) injuries. Do not use heat therapy on new injuries unless directed by your health care provider. °HOW TO USE HEAT THERAPY °There are several different kinds of heat therapy, including: °· Moist heat pack. °· Warm water bath. °· Hot water bottle. °· Electric heating pad. °· Heated gel pack. °· Heated wrap. °· Electric heating pad. °Use the heat therapy method suggested by your health care provider. Follow your health care provider's instructions on when and how to use heat therapy. °GENERAL HEAT THERAPY RECOMMENDATIONS °· Do not sleep while using heat therapy. Only use heat therapy while you are awake. °· Your skin may turn pink while using heat therapy. Do not use heat therapy if your skin turns red. °· Do not use heat therapy if you have new pain. °· High heat or long exposure to heat can cause burns. Be careful when using heat therapy to avoid burning your skin. °· Do not use heat therapy on areas of your skin that are already irritated, such as with a rash or sunburn. °SEEK MEDICAL CARE IF: °· You have blisters, redness, swelling, or numbness. °· You have new pain. °· Your pain is worse. °MAKE SURE  YOU: °· Understand these instructions. °· Will watch your condition. °· Will get help right away if you are not doing well or get worse. °  °This information is not intended to replace advice given to you by your health care provider. Make sure you discuss any questions you have with your health care provider. °  °Document Released: 06/08/2011 Document Revised: 04/06/2014 Document Reviewed: 05/09/2013 °Elsevier Interactive Patient Education ©2016 Elsevier Inc. ° °Joint Pain °Joint pain, which is also called arthralgia, can be caused by many things. Joint pain often goes away when you follow your health care provider's instructions for relieving pain at home. However, joint pain can also be caused by conditions that require further treatment. Common causes of joint pain include: °· Bruising in the area of the joint. °· Overuse of the joint. °· Wear and tear on the joints that occur with aging (osteoarthritis). °· Various other forms of arthritis. °· A buildup of a crystal form of uric acid in the joint (gout). °· Infections of the joint (septic arthritis) or of the bone (osteomyelitis). °Your health care provider may recommend medicine to help with the pain. If your joint pain continues, additional tests may be needed to diagnose your condition. °HOME CARE INSTRUCTIONS °Watch your condition for any changes. Follow these instructions as directed to lessen the pain that you are feeling. °· Take medicines only as directed by your health care provider. °· Rest the affected area for as long as your health care provider says that you should. If directed to do so, raise   the painful joint above the level of your heart while you are sitting or lying down. °· Do not do things that cause or worsen pain. °· If directed, apply ice to the painful area: °¨ Put ice in a plastic bag. °¨ Place a towel between your skin and the bag. °¨ Leave the ice on for 20 minutes, 2-3 times per day. °· Wear an elastic bandage, splint, or sling as  directed by your health care provider. Loosen the elastic bandage or splint if your fingers or toes become numb and tingle, or if they turn cold and blue. °· Begin exercising or stretching the affected area as directed by your health care provider. Ask your health care provider what types of exercise are safe for you. °· Keep all follow-up visits as directed by your health care provider. This is important. °SEEK MEDICAL CARE IF: °· Your pain increases, and medicine does not help. °· Your joint pain does not improve within 3 days. °· You have increased bruising or swelling. °· You have a fever. °· You lose 10 lb (4.5 kg) or more without trying. °SEEK IMMEDIATE MEDICAL CARE IF: °· You are not able to move the joint. °· Your fingers or toes become numb or they turn cold and blue. °  °This information is not intended to replace advice given to you by your health care provider. Make sure you discuss any questions you have with your health care provider. °  °Document Released: 03/16/2005 Document Revised: 04/06/2014 Document Reviewed: 12/26/2013 °Elsevier Interactive Patient Education ©2016 Elsevier Inc. ° °

## 2015-05-07 NOTE — ED Notes (Signed)
Pt reports she has chronic pain to left wrist and also to neck. She states the pain is constant and is not relieved with OTC pain meds. She reports her hands swell as well, onset months ago. She was seen here for the same and was told she was having "muscle spasms."

## 2015-05-07 NOTE — ED Notes (Signed)
Pt st;'s she has had pain in bil wrist for approx a month.  Worse on left.  Also c/o muscle spasms in her neck.

## 2015-05-09 NOTE — ED Provider Notes (Signed)
CSN: [id number]     Arrival date & time 05/07/15  1807 History   First MD Initiated Contact with Patient 05/07/15 1916     Chief Complaint  Patient presents with  . Joint Pain   HPI  Vanessa [name] is a 60 y.o. F PMH significant for multiple comorbidities presenting with a 1 month history of bilateral wrist pain. She describes her pain as chronic, non-radiating, worse in her left wrist, worse at night, improved with shaking, associated with numbness and tingling in fingers, 10/10 pain scale. She mentions that she also has chronic, right-sided neck pain which she is being evaluated for by her PCP and is taking gabapentin and vicodin for. She denies fevers, chills, CP, SOB, abdominal pain, N/V/D, loss of bladder/bowel control, trauma to wrists or neck.   Past Medical History  Diagnosis Date  . Hypertension   . Heart murmur   . Asthma   . Emphysema   . Diabetes mellitus   . Hyperlipidemia   . Allergic rhinitis   . Chronic headache   . Neuropathy (HCC)   . Grave's disease   . Herniated disc   . COPD (chronic obstructive pulmonary disease) (HCC)   . Menopause   . Hernia, umbilical   . Anxiety   . Depression   . Back pain, chronic   . Shingles   . Herniated disc   . Arthritis     rheumatoid  . 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 Generalized anxiety disorder 05/26/2012  . Chronic back pain 05/27/2012    Secondary to L4-5 herniated disc per patient  . OSA (obstructive sleep apnea)     has a cpap  . Wears glasses   . Wears dentures     top  . Tear of lateral meniscus of right knee 10/13/2013  . Alcoholism (HCC)   . Sleep apnea     wear c-pap  . Allergy   . Emphysema of lung (HCC)   . GERD (gastroesophageal reflux disease)   . Osteoporosis   . Rheumatoid arthritis [facility])    Past Surgical History  Procedure Laterality Date  . Cholecystectomy    . Tubal ligation    . Foot surgery      cyst rt foot  .  Laparoscopy      age 22  . Eye surgery      eye back in socker-rt  . Knee arthroscopy with lateral menisectomy Right 10/13/2013    Procedure: RIGHT KNEE ARTHROSCOPY WITH PARTIAL LATERAL MENISECTOMY/DEBRIDEMENT/SHAVING ;  Surgeon: Eulas Post, MD;  Location: [facility];  Service: Orthopedics;  Laterality: Right;  . Left heart catheterization with coronary angiogram N/A 05/30/2012    Procedure: LEFT HEART CATHETERIZATION WITH CORONARY ANGIOGRAM;  Surgeon: [name], MD;  Location: [facility] CATH LAB;  Service: Cardiovascular;  Laterality: N/A;   Family History  Problem Relation Age of Onset  . Emphysema Mother   . Allergies Mother   . Asthma Mother   . Heart disease Mother   . Rheum arthritis Mother   . Diabetes Mother   . Kidney disease Mother   . Allergies Daughter   . Asthma Brother   . Breast cancer Other     aunt  . Lung cancer Brother   . Throat cancer Brother   . Colon cancer Neg Hx   . Colon polyps Neg Hx   . Esophageal cancer Neg Hx   . Rectal  cancer Neg Hx   . Stomach cancer Neg Hx    Social History  Substance Use Topics  . Smoking status: Current Every Day Smoker -- 1.00 packs/day for 40 years    Types: Cigarettes  . Smokeless tobacco: Never Used  . Alcohol Use: No   OB History    Gravida Para Term Preterm AB TAB SAB Ectopic Multiple Living   5 3   2  2   3      Review of Systems  Ten systems are reviewed and are negative for acute change except as noted in the HPI  Allergies  Ciprofloxacin; Fluocinolone; and Penicillins  Home Medications   Prior to Admission medications   Medication Sig Start Date End Date Taking? Authorizing Provider  acyclovir (ZOVIRAX) 400 MG tablet Take 400 mg by mouth 2 (two) times daily.     Historical Provider, MD  albuterol (PROAIR HFA) 108 (90 BASE) MCG/ACT inhaler Inhale 2 puffs into the lungs 4 (four) times daily. For COPD    Historical Provider, MD  atorvastatin (LIPITOR) 40 MG tablet Take 1 tablet (40 mg  total) by mouth daily. 10/06/12   12/07/12, DO  BuPROPion HCl (WELLBUTRIN PO) Take 1 tablet by mouth 2 (two) times daily.    Historical Provider, MD  busPIRone (BUSPAR) 10 MG tablet Take 20 mg by mouth 3 (three) times daily.     Historical Provider, MD  cyclobenzaprine (FLEXERIL) 10 MG tablet 1 tablet 3 times a day 04/14/15   04/16/15, PA  cycloSPORINE (RESTASIS) 0.05 % ophthalmic emulsion Place 1 drop into both eyes 2 (two) times daily.    Historical Provider, MD  diclofenac sodium (VOLTAREN) 1 % GEL Apply 2 g topically 4 (four) times daily as needed (pain).     Historical Provider, MD  FLUoxetine (PROZAC) 20 MG capsule Take 60 mg by mouth every morning.     Historical Provider, MD  glipiZIDE (GLUCOTROL) 10 MG tablet Take 10 mg by mouth 2 (two) times daily before a meal.    Historical Provider, MD  HYDROcodone-acetaminophen (NORCO/VICODIN) 5-325 MG tablet Take 2 tablets by mouth every 6 (six) hours as needed. 04/14/15   04/16/15, PA  ipratropium-albuterol (DUONEB) 0.5-2.5 (3) MG/3ML SOLN Take 3 mLs by nebulization every 6 (six) hours as needed (Asthma).    Historical Provider, MD  levothyroxine (SYNTHROID, LEVOTHROID) 175 MCG tablet Take 175 mcg by mouth every morning.     Historical Provider, MD  lisinopril (PRINIVIL,ZESTRIL) 10 MG tablet Take 10 mg by mouth daily.    Historical Provider, MD  loratadine (CLARITIN) 10 MG tablet Take 10 mg by mouth daily as needed for allergies.    Historical Provider, MD  meloxicam (MOBIC) 15 MG tablet Take 15 mg by mouth daily.    Historical Provider, MD  metFORMIN (GLUCOPHAGE) 1000 MG tablet Take 1,000 mg by mouth 2 (two) times daily.     Historical Provider, MD  methocarbamol (ROBAXIN) 500 MG tablet Take 1 tablet (500 mg total) by mouth 2 (two) times daily. 12/07/14   02/06/15, PA-C  Olopatadine HCl (PATADAY) 0.2 % SOLN Apply 1 drop to eye daily. 12/21/14   12/23/14, MD  pregabalin (LYRICA) 75 MG capsule Take 75 mg by mouth 3 (three) times  daily.    Historical Provider, MD  QUEtiapine (SEROQUEL) 25 MG tablet Take 50 mg by mouth at bedtime.     Historical Provider, MD  sennosides-docusate sodium (SENOKOT-S) 8.6-50 MG tablet Take 2 tablets  by mouth daily. Patient taking differently: Take 2 tablets by mouth daily as needed for constipation.  10/13/13   Teryl Lucy, MD  tiotropium (SPIRIVA) 18 MCG inhalation capsule Place 1 capsule (18 mcg total) into inhaler and inhale daily. 12/18/14   Valentino Nose, MD  traMADol (ULTRAM) 50 MG tablet Take 50 mg by mouth every 6 (six) hours as needed for moderate pain.  02/05/14   Historical Provider, MD  verapamil (CALAN) 40 MG tablet Take 40 mg by mouth 3 (three) times daily.    Historical Provider, MD   BP 115/77 mmHg  Pulse 93  Temp(Src) 98.5 F (36.9 C) (Oral)  Resp 18  SpO2 97% Physical Exam  Constitutional: She is oriented to person, place, and time. She appears well-developed and well-nourished. No distress.  Obese  HENT:  Head: Normocephalic and atraumatic.  Mouth/Throat: Oropharynx is clear and moist. No oropharyngeal exudate.  Eyes: Conjunctivae are normal. Pupils are equal, round, and reactive to light. Right eye exhibits no discharge. Left eye exhibits no discharge. No scleral icterus.  Neck: Normal range of motion. Neck supple. No tracheal deviation present.  Cardiovascular: Normal rate and intact distal pulses.   Pulmonary/Chest: Effort normal. No respiratory distress.  Abdominal: She exhibits no distension.  Musculoskeletal: Normal range of motion. She exhibits tenderness. She exhibits no edema.  Cervical, thoracic, and lumbar spine without midline tenderness, stepoff or deformity. Hyperesthesias of wrists BL. Neurovascularly intact BL.   Lymphadenopathy:    She has no cervical adenopathy.  Neurological: She is alert and oriented to person, place, and time. Coordination normal.  Skin: Skin is warm and dry. No rash noted. She is not diaphoretic. No erythema.  Psychiatric:  She has a normal mood and affect. Her behavior is normal.  Nursing note and vitals reviewed.   ED Course  Procedures   MDM   Final diagnoses:  Joint pain   Patient nontoxic appearing, VSS. She stated she wants something strong for pain but I explained she is currently taking a narcotic, and I do not feel comfortable prescribing anything else stronger at this time. Encouraged PCP/orthopedic follow-up and possibly referral to pain clinic but she stated "I do not want to go to a pain clinic because I've been to one and they don't manage your diabetes."  In regards to her presenting complaint, I feel this is most likely carpal tunnel, but she states "that is not what this is." I explained that having diabetes puts you at an increased risk and her presentation is consistent with carpal tunnel, but she requested an xray. I explained an xray is not indicated at this time, and we would be happy to give her a splint, toradol, and robaxin here. Patient in agreement with plan. Patient may be safely discharged home. Discussed reasons for return. Patient to follow-up with primary care provider within one week. Patient in understanding and agreement with the plan.   Melton Krebs, PA-C 05/09/15 2147  Cathren Laine, MD 05/12/15 [phone]

## 2015-05-28 ENCOUNTER — Ambulatory Visit: Payer: Medicaid Other | Admitting: Pulmonary Disease

## 2015-08-03 ENCOUNTER — Encounter (HOSPITAL_COMMUNITY): Payer: Self-pay | Admitting: Emergency Medicine

## 2015-08-03 ENCOUNTER — Ambulatory Visit (HOSPITAL_COMMUNITY)
Admission: EM | Admit: 2015-08-03 | Discharge: 2015-08-03 | Disposition: A | Discharge status: Home / Self Care | Payer: Medicaid Other | Attending: Family Medicine | Admitting: Family Medicine

## 2015-08-03 DIAGNOSIS — R05 Cough: Secondary | ICD-10-CM | POA: Diagnosis not present

## 2015-08-03 DIAGNOSIS — G8929 Other chronic pain: Secondary | ICD-10-CM | POA: Diagnosis not present

## 2015-08-03 DIAGNOSIS — B029 Zoster without complications: Secondary | ICD-10-CM

## 2015-08-03 DIAGNOSIS — J441 Chronic obstructive pulmonary disease with (acute) exacerbation: Secondary | ICD-10-CM

## 2015-08-03 DIAGNOSIS — R059 Cough, unspecified: Secondary | ICD-10-CM

## 2015-08-03 MED ORDER — HYDROCODONE-HOMATROPINE 5-1.5 MG/5ML PO SYRP: 5.0000 mL | ORAL_SOLUTION | Freq: Four times a day (QID) | ORAL | Status: DC | PRN

## 2015-08-03 MED ORDER — KETOROLAC TROMETHAMINE 60 MG/2ML IM SOLN
60.0000 mg | Freq: Once | INTRAMUSCULAR | Status: AC
  Administered 2015-08-03: 60 mg via INTRAMUSCULAR

## 2015-08-03 MED ORDER — KETOROLAC TROMETHAMINE 60 MG/2ML IM SOLN
INTRAMUSCULAR | Status: AC
  Filled 2015-08-03: qty 2

## 2015-08-03 MED ORDER — PREDNISONE 5 MG (48) PO TBPK: 5.0000 mg | ORAL_TABLET | Freq: Every day | ORAL | Status: DC

## 2015-08-03 MED ORDER — LIDOCAINE 5 % EX PTCH: 1.0000 | MEDICATED_PATCH | CUTANEOUS | Status: DC

## 2015-08-03 NOTE — ED Provider Notes (Signed)
CSN: [id number]     Arrival date & time 08/03/15  1527 History   First MD Initiated Contact with Patient 08/03/15 1721     Chief Complaint  Patient presents with  . Herpes Zoster   (Consider location/radiation/quality/duration/timing/severity/associated sxs/prior Treatment) HPI Comments: Vanessa [name] is a pleasant 60 yo female with a PMH of COPD, chronic shingles on daily anti-virals, chronic pain, type 2 DM and HTN. She presents with a "flare" of her COPD and request a prednisone pack until she can see her physician. She also has a cough with her flare. Mild dyspnea which is chronic. She thinks she is getting an outbreak of shingles and would like some Lidoderm patches. She would also like a Toradol shot for the pain in her skin from the shingles outbreak. She states this is helpful as well.   The history is provided by the patient.    Past Medical History  Diagnosis Date  . Hypertension   . Heart murmur   . Asthma   . Emphysema   . Diabetes mellitus   . Hyperlipidemia   . Allergic rhinitis   . Chronic headache   . Neuropathy (HCC)   . Grave's disease   . Herniated disc   . COPD (chronic obstructive pulmonary disease) (HCC)   . Menopause   . Hernia, umbilical   . Anxiety   . Depression   . Back pain, chronic   . Shingles   . Herniated disc   . Arthritis     rheumatoid  . 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 Generalized anxiety disorder 05/26/2012  . Chronic back pain 05/27/2012    Secondary to L4-5 herniated disc per patient  . OSA (obstructive sleep apnea)     has a cpap  . Wears glasses   . Wears dentures     top  . Tear of lateral meniscus of right knee 10/13/2013  . Alcoholism (HCC)   . Sleep apnea     wear c-pap  . Allergy   . Emphysema of lung (HCC)   . GERD (gastroesophageal reflux disease)   . Osteoporosis   . Rheumatoid arthritis [facility])    Past Surgical History  Procedure Laterality Date   . Cholecystectomy    . Tubal ligation    . Foot surgery      cyst rt foot  . Laparoscopy      age 60  . Eye surgery      eye back in socker-rt  . Knee arthroscopy with lateral menisectomy Right 10/13/2013    Procedure: RIGHT KNEE ARTHROSCOPY WITH PARTIAL LATERAL MENISECTOMY/DEBRIDEMENT/SHAVING ;  Surgeon: Eulas Post, MD;  Location: [facility];  Service: Orthopedics;  Laterality: Right;  . Left heart catheterization with coronary angiogram N/A 05/30/2012    Procedure: LEFT HEART CATHETERIZATION WITH CORONARY ANGIOGRAM;  Surgeon: [name], MD;  Location: [facility] CATH LAB;  Service: Cardiovascular;  Laterality: N/A;   Family History  Problem Relation Age of Onset  . Emphysema Mother   . Allergies Mother   . Asthma Mother   . Heart disease Mother   . Rheum arthritis Mother   . Diabetes Mother   . Kidney disease Mother   . Allergies Daughter   . Asthma Brother   . Breast cancer Other     aunt  . Lung cancer Brother   . Throat cancer Brother   . Colon cancer Neg Hx   .  Colon polyps Neg Hx   . Esophageal cancer Neg Hx   . Rectal cancer Neg Hx   . Stomach cancer Neg Hx    Social History  Substance Use Topics  . Smoking status: Current Every Day Smoker -- 1.00 packs/day for 40 years    Types: Cigarettes  . Smokeless tobacco: Never Used  . Alcohol Use: No   OB History    Gravida Para Term Preterm AB TAB SAB Ectopic Multiple Living   5 3   2  2   3      Review of Systems  Constitutional: Negative for fever and chills.  HENT: Negative.   Respiratory: Positive for cough, shortness of breath and wheezing.   Cardiovascular: Negative.   Musculoskeletal: Positive for myalgias, back pain and arthralgias.  Skin: Positive for rash.  Neurological: Negative.   Psychiatric/Behavioral: Negative.     Allergies  Ciprofloxacin; Fluocinolone; and Penicillins  Home Medications   Prior to Admission medications   Medication Sig Start Date End Date Taking?  Authorizing Provider  acyclovir (ZOVIRAX) 400 MG tablet Take 400 mg by mouth 2 (two) times daily.    Yes Historical Provider, MD  albuterol (PROAIR HFA) 108 (90 BASE) MCG/ACT inhaler Inhale 2 puffs into the lungs 4 (four) times daily. For COPD   Yes Historical Provider, MD  atorvastatin (LIPITOR) 40 MG tablet Take 1 tablet (40 mg total) by mouth daily. 10/06/12  Yes 12/07/12, DO  BuPROPion HCl (WELLBUTRIN PO) Take 1 tablet by mouth 2 (two) times daily.   Yes Historical Provider, MD  busPIRone (BUSPAR) 10 MG tablet Take 20 mg by mouth 3 (three) times daily.    Yes Historical Provider, MD  cyclobenzaprine (FLEXERIL) 10 MG tablet 1 tablet 3 times a day 04/14/15  Yes 04/16/15, PA  cycloSPORINE (RESTASIS) 0.05 % ophthalmic emulsion Place 1 drop into both eyes 2 (two) times daily.   Yes Historical Provider, MD  diclofenac sodium (VOLTAREN) 1 % GEL Apply 2 g topically 4 (four) times daily as needed (pain).    Yes Historical Provider, MD  FLUoxetine (PROZAC) 20 MG capsule Take 60 mg by mouth every morning.    Yes Historical Provider, MD  glipiZIDE (GLUCOTROL) 10 MG tablet Take 10 mg by mouth 2 (two) times daily before a meal.   Yes Historical Provider, MD  HYDROcodone-acetaminophen (NORCO/VICODIN) 5-325 MG tablet Take 2 tablets by mouth every 6 (six) hours as needed. 04/14/15  Yes 04/16/15, PA  ipratropium-albuterol (DUONEB) 0.5-2.5 (3) MG/3ML SOLN Take 3 mLs by nebulization every 6 (six) hours as needed (Asthma).   Yes Historical Provider, MD  levothyroxine (SYNTHROID, LEVOTHROID) 175 MCG tablet Take 175 mcg by mouth every morning.    Yes Historical Provider, MD  lisinopril (PRINIVIL,ZESTRIL) 10 MG tablet Take 10 mg by mouth daily.   Yes Historical Provider, MD  loratadine (CLARITIN) 10 MG tablet Take 10 mg by mouth daily as needed for allergies.   Yes Historical Provider, MD  meloxicam (MOBIC) 15 MG tablet Take 15 mg by mouth daily.   Yes Historical Provider, MD  metFORMIN (GLUCOPHAGE)  1000 MG tablet Take 1,000 mg by mouth 2 (two) times daily.    Yes Historical Provider, MD  methocarbamol (ROBAXIN) 500 MG tablet Take 1 tablet (500 mg total) by mouth 2 (two) times daily. 12/07/14  Yes 02/06/15, PA-C  Olopatadine HCl (PATADAY) 0.2 % SOLN Apply 1 drop to eye daily. 12/21/14  Yes 12/23/14, MD  pregabalin Charm Rings)  75 MG capsule Take 75 mg by mouth 3 (three) times daily.   Yes Historical Provider, MD  QUEtiapine (SEROQUEL) 25 MG tablet Take 50 mg by mouth at bedtime.    Yes Historical Provider, MD  sennosides-docusate sodium (SENOKOT-S) 8.6-50 MG tablet Take 2 tablets by mouth daily. Patient taking differently: Take 2 tablets by mouth daily as needed for constipation.  10/13/13  Yes Teryl Lucy, MD  tiotropium (SPIRIVA) 18 MCG inhalation capsule Place 1 capsule (18 mcg total) into inhaler and inhale daily. 12/18/14  Yes Valentino Nose, MD  traMADol (ULTRAM) 50 MG tablet Take 50 mg by mouth every 6 (six) hours as needed for moderate pain.  02/05/14  Yes Historical Provider, MD  verapamil (CALAN) 40 MG tablet Take 40 mg by mouth 3 (three) times daily.   Yes Historical Provider, MD  HYDROcodone-homatropine (HYCODAN) 5-1.5 MG/5ML syrup Take 5 mLs by mouth every 6 (six) hours as needed for cough. 08/03/15   Riki Sheer, PA-C  lidocaine (LIDODERM) 5 % Place 1 patch onto the skin daily. Remove & Discard patch within 12 hours or as directed by MD 08/03/15   Riki Sheer, PA-C  predniSONE (STERAPRED UNI-PAK 48 TAB) 5 MG (48) TBPK tablet Take 1 tablet (5 mg total) by mouth daily. 08/03/15   Riki Sheer, PA-C   Meds Ordered and Administered this Visit   Medications  ketorolac (TORADOL) injection 60 mg (not administered)    BP 137/78 mmHg  Pulse 68  Temp(Src) 97.9 F (36.6 C) (Oral)  Resp 14  SpO2 98% No data found.   Physical Exam  Constitutional: She is oriented to person, place, and time. She appears well-developed and well-nourished. No distress.  HENT:  Head:  Normocephalic and atraumatic.  Neck: Normal range of motion.  Pulmonary/Chest: Effort normal. She has wheezes.  Expiratory mild wheeze throughout with good air flow  Neurological: She is alert and oriented to person, place, and time.  Skin: Skin is warm and dry. Rash noted. She is not diaphoretic.  Hint of a vesicular rash to right hip and upper buttocks.  Psychiatric: Her behavior is normal.  Nursing note and vitals reviewed.   ED Course  Procedures (including critical care time)  Labs Review Labs Reviewed - No data to display  Imaging Review No results found.   Visual Acuity Review  Right Eye Distance:   Left Eye Distance:   Bilateral Distance:    Right Eye Near:   Left Eye Near:    Bilateral Near:         MDM   1. COPD exacerbation (HCC)   2. Cough   3. Shingles   4. Chronic pain    1. Treat with Pred pack and Hycodan, no emergent needs. F/U with PCP 3. Chronic anti-virals. Treat with Lidoderm patches as needed 4. Toradol 60mg  IM given as requested. Normal Scr.    , PA-C 08/03/15 1753

## 2015-08-03 NOTE — ED Notes (Signed)
Patient c/o possible herpes outbreak x 3 days. Patient reports it began in her buttock and she also noticed some in her hip/groin area. Patient reports she took her medication as prescribed and also had to take her inhaler and nebulizer due to SOB. Patient also reports since the outbreak she's had increased pain and flare up of her diabetic neuropathy.

## 2015-08-03 NOTE — Discharge Instructions (Signed)
° ° ° °  Chronic Obstructive Pulmonary Disease Exacerbation Treating your shingles flare with patches. Your COPD with prednisone as you requested and cough syrup. Your acute pain with toradol. F/U with your PCP as we discussed.   Chronic obstructive pulmonary disease (COPD) is a common lung condition in which airflow from the lungs is limited. COPD is a general term that can be used to describe many different lung problems that limit airflow, including chronic bronchitis and emphysema. COPD exacerbations are episodes when breathing symptoms become much worse and require extra treatment. Without treatment, COPD exacerbations can be life threatening, and frequent COPD exacerbations can cause further damage to your lungs. CAUSES  Respiratory infections.  Exposure to smoke.  Exposure to air pollution, chemical fumes, or dust. Sometimes there is no apparent cause or trigger. RISK FACTORS  Smoking cigarettes.  Older age.  Frequent prior COPD exacerbations. SIGNS AND SYMPTOMS  Increased coughing.  Increased thick spit (sputum) production.  Increased wheezing.  Increased shortness of breath.  Rapid breathing.  Chest tightness. DIAGNOSIS Your medical history, a physical exam, and tests will help your health care provider make a diagnosis. Tests may include:  A chest X-ray.  Basic lab tests.  Sputum testing.  An arterial blood gas test. TREATMENT Depending on the severity of your COPD exacerbation, you may need to be admitted to a hospital for treatment. Some of the treatments commonly used to treat COPD exacerbations are:   Antibiotic medicines.  Bronchodilators. These are drugs that expand the air passages. They may be given with an inhaler or nebulizer. Spacer devices may be needed to help improve drug delivery.  Corticosteroid medicines.  Supplemental oxygen therapy.  Airway clearing techniques, such as noninvasive ventilation (NIV) and positive expiratory pressure  (PEP). These provide respiratory support through a mask or other noninvasive device. HOME CARE INSTRUCTIONS  Do not smoke. Quitting smoking is very important to prevent COPD from getting worse and exacerbations from happening as often.  Avoid exposure to all substances that irritate the airway, especially to tobacco smoke.  If you were prescribed an antibiotic medicine, finish it all even if you start to feel better.  Take all medicines as directed by your health care provider.It is important to use correct technique with inhaled medicines.  Drink enough fluids to keep your urine clear or pale yellow (unless you have a medical condition that requires fluid restriction).  Use a cool mist vaporizer. This makes it easier to clear your chest when you cough.  If you have a home nebulizer and oxygen, continue to use them as directed.  Maintain all necessary vaccinations to prevent infections.  Exercise regularly.  Eat a healthy diet.  Keep all follow-up appointments as directed by your health care provider. SEEK IMMEDIATE MEDICAL CARE IF:  You have worsening shortness of breath.  You have trouble talking.  You have severe chest pain.  You have blood in your sputum.  You have a fever.  You have weakness, vomit repeatedly, or faint.  You feel confused.  You continue to get worse. MAKE SURE YOU:  Understand these instructions.  Will watch your condition.  Will get help right away if you are not doing well or get worse.   This information is not intended to replace advice given to you by your health care provider. Make sure you discuss any questions you have with your health care provider.   Document Released: 01/11/2007 Document Revised: 04/06/2014 Document Reviewed: 11/18/2012 Elsevier Interactive Patient Education [employer]

## 2015-08-06 ENCOUNTER — Ambulatory Visit (HOSPITAL_COMMUNITY)
Admission: EM | Admit: 2015-08-06 | Discharge: 2015-08-06 | Disposition: A | Discharge status: Nursing Home (Includes ICF) | Payer: Medicaid Other | Attending: Family Medicine | Admitting: Family Medicine

## 2015-08-06 ENCOUNTER — Encounter (HOSPITAL_COMMUNITY): Payer: Self-pay | Admitting: *Deleted

## 2015-08-06 ENCOUNTER — Encounter (HOSPITAL_COMMUNITY): Payer: Self-pay

## 2015-08-06 ENCOUNTER — Emergency Department (HOSPITAL_COMMUNITY)
Admission: EM | Admit: 2015-08-06 | Discharge: 2015-08-06 | Disposition: A | Discharge status: Home / Self Care | Payer: Medicaid Other | Attending: Emergency Medicine | Admitting: Emergency Medicine

## 2015-08-06 ENCOUNTER — Ambulatory Visit (HOSPITAL_COMMUNITY): Payer: Medicaid Other

## 2015-08-06 ENCOUNTER — Emergency Department (HOSPITAL_COMMUNITY): Payer: Medicaid Other

## 2015-08-06 DIAGNOSIS — Z7952 Long term (current) use of systemic steroids: Secondary | ICD-10-CM | POA: Insufficient documentation

## 2015-08-06 DIAGNOSIS — E0500 Thyrotoxicosis with diffuse goiter without thyrotoxic crisis or storm: Secondary | ICD-10-CM | POA: Diagnosis not present

## 2015-08-06 DIAGNOSIS — F17210 Nicotine dependence, cigarettes, uncomplicated: Secondary | ICD-10-CM | POA: Insufficient documentation

## 2015-08-06 DIAGNOSIS — Z7984 Long term (current) use of oral hypoglycemic drugs: Secondary | ICD-10-CM | POA: Diagnosis not present

## 2015-08-06 DIAGNOSIS — E785 Hyperlipidemia, unspecified: Secondary | ICD-10-CM | POA: Insufficient documentation

## 2015-08-06 DIAGNOSIS — J449 Chronic obstructive pulmonary disease, unspecified: Secondary | ICD-10-CM | POA: Diagnosis not present

## 2015-08-06 DIAGNOSIS — Y9289 Other specified places as the place of occurrence of the external cause: Secondary | ICD-10-CM | POA: Diagnosis not present

## 2015-08-06 DIAGNOSIS — Z79899 Other long term (current) drug therapy: Secondary | ICD-10-CM | POA: Insufficient documentation

## 2015-08-06 DIAGNOSIS — R55 Syncope and collapse: Secondary | ICD-10-CM

## 2015-08-06 DIAGNOSIS — F329 Major depressive disorder, single episode, unspecified: Secondary | ICD-10-CM | POA: Diagnosis not present

## 2015-08-06 DIAGNOSIS — X501XXA Overexertion from prolonged static or awkward postures, initial encounter: Secondary | ICD-10-CM | POA: Insufficient documentation

## 2015-08-06 DIAGNOSIS — R011 Cardiac murmur, unspecified: Secondary | ICD-10-CM | POA: Diagnosis not present

## 2015-08-06 DIAGNOSIS — R300 Dysuria: Secondary | ICD-10-CM | POA: Diagnosis not present

## 2015-08-06 DIAGNOSIS — M7918 Myalgia, other site: Secondary | ICD-10-CM

## 2015-08-06 DIAGNOSIS — S39012A Strain of muscle, fascia and tendon of lower back, initial encounter: Secondary | ICD-10-CM | POA: Insufficient documentation

## 2015-08-06 DIAGNOSIS — R109 Unspecified abdominal pain: Secondary | ICD-10-CM | POA: Diagnosis not present

## 2015-08-06 DIAGNOSIS — Y998 Other external cause status: Secondary | ICD-10-CM | POA: Diagnosis not present

## 2015-08-06 DIAGNOSIS — G8929 Other chronic pain: Secondary | ICD-10-CM | POA: Diagnosis not present

## 2015-08-06 DIAGNOSIS — Z791 Long term (current) use of non-steroidal anti-inflammatories (NSAID): Secondary | ICD-10-CM | POA: Insufficient documentation

## 2015-08-06 DIAGNOSIS — Z8619 Personal history of other infectious and parasitic diseases: Secondary | ICD-10-CM | POA: Insufficient documentation

## 2015-08-06 DIAGNOSIS — Z9889 Other specified postprocedural states: Secondary | ICD-10-CM | POA: Diagnosis not present

## 2015-08-06 DIAGNOSIS — S3992XA Unspecified injury of lower back, initial encounter: Secondary | ICD-10-CM | POA: Diagnosis present

## 2015-08-06 DIAGNOSIS — I10 Essential (primary) hypertension: Secondary | ICD-10-CM | POA: Diagnosis not present

## 2015-08-06 DIAGNOSIS — Z8719 Personal history of other diseases of the digestive system: Secondary | ICD-10-CM | POA: Diagnosis not present

## 2015-08-06 DIAGNOSIS — G629 Polyneuropathy, unspecified: Secondary | ICD-10-CM | POA: Diagnosis not present

## 2015-08-06 DIAGNOSIS — Y9389 Activity, other specified: Secondary | ICD-10-CM | POA: Diagnosis not present

## 2015-08-06 DIAGNOSIS — Z880 Allergy status to penicillin: Secondary | ICD-10-CM | POA: Diagnosis not present

## 2015-08-06 DIAGNOSIS — M069 Rheumatoid arthritis, unspecified: Secondary | ICD-10-CM | POA: Diagnosis not present

## 2015-08-06 DIAGNOSIS — Z9981 Dependence on supplemental oxygen: Secondary | ICD-10-CM | POA: Diagnosis not present

## 2015-08-06 DIAGNOSIS — F411 Generalized anxiety disorder: Secondary | ICD-10-CM | POA: Diagnosis not present

## 2015-08-06 DIAGNOSIS — G4730 Sleep apnea, unspecified: Secondary | ICD-10-CM | POA: Insufficient documentation

## 2015-08-06 DIAGNOSIS — E119 Type 2 diabetes mellitus without complications: Secondary | ICD-10-CM | POA: Diagnosis not present

## 2015-08-06 LAB — URINALYSIS, ROUTINE W REFLEX MICROSCOPIC
Bilirubin Urine: NEGATIVE
GLUCOSE, UA: NEGATIVE mg/dL
HGB URINE DIPSTICK: NEGATIVE
Ketones, ur: NEGATIVE mg/dL
LEUKOCYTES UA: NEGATIVE
Nitrite: NEGATIVE
PH: 5.5 (ref 5.0–8.0)
PROTEIN: NEGATIVE mg/dL
SPECIFIC GRAVITY, URINE: 1.013 (ref 1.005–1.030)

## 2015-08-06 LAB — CBC WITH DIFFERENTIAL/PLATELET
BASOS ABS: 0 10*3/uL (ref 0.0–0.1)
Basophils Relative: 0 %
EOS PCT: 0 %
Eosinophils Absolute: 0 10*3/uL (ref 0.0–0.7)
HEMATOCRIT: 37.2 % (ref 36.0–46.0)
HEMOGLOBIN: 12.2 g/dL (ref 12.0–15.0)
LYMPHS ABS: 1.5 10*3/uL (ref 0.7–4.0)
LYMPHS PCT: 10 %
MCH: 28.8 pg (ref 26.0–34.0)
MCHC: 32.8 g/dL (ref 30.0–36.0)
MCV: 87.9 fL (ref 78.0–100.0)
Monocytes Absolute: 0.7 10*3/uL (ref 0.1–1.0)
Monocytes Relative: 4 %
Neutro Abs: 13.8 10*3/uL — ABNORMAL HIGH (ref 1.7–7.7)
Neutrophils Relative %: 86 %
Platelets: 233 10*3/uL (ref 150–400)
RBC: 4.23 MIL/uL (ref 3.87–5.11)
RDW: 13.9 % (ref 11.5–15.5)
WBC: 16 10*3/uL — AB (ref 4.0–10.5)

## 2015-08-06 LAB — COMPREHENSIVE METABOLIC PANEL
ALK PHOS: 56 U/L (ref 38–126)
ALT: 21 U/L (ref 14–54)
AST: 16 U/L (ref 15–41)
Albumin: 4 g/dL (ref 3.5–5.0)
Anion gap: 9 (ref 5–15)
BILIRUBIN TOTAL: 0.3 mg/dL (ref 0.3–1.2)
BUN: 14 mg/dL (ref 6–20)
CALCIUM: 9.4 mg/dL (ref 8.9–10.3)
CHLORIDE: 104 mmol/L (ref 101–111)
CO2: 26 mmol/L (ref 22–32)
CREATININE: 0.89 mg/dL (ref 0.44–1.00)
Glucose, Bld: 65 mg/dL (ref 65–99)
Potassium: 4.2 mmol/L (ref 3.5–5.1)
Sodium: 139 mmol/L (ref 135–145)
Total Protein: 6.6 g/dL (ref 6.5–8.1)

## 2015-08-06 LAB — PROTIME-INR
INR: 1.24 (ref 0.00–1.49)
PROTHROMBIN TIME: 15.8 s — AB (ref 11.6–15.2)

## 2015-08-06 MED ORDER — OXYCODONE-ACETAMINOPHEN 5-325 MG PO TABS
2.0000 | ORAL_TABLET | Freq: Once | ORAL | Status: AC
  Administered 2015-08-06: 2 via ORAL
  Filled 2015-08-06: qty 2

## 2015-08-06 MED ORDER — DIAZEPAM 5 MG PO TABS
5.0000 mg | ORAL_TABLET | Freq: Once | ORAL | Status: AC
  Administered 2015-08-06: 5 mg via ORAL
  Filled 2015-08-06: qty 1

## 2015-08-06 MED ORDER — NAPROXEN 500 MG PO TABS: 500.0000 mg | ORAL_TABLET | Freq: Two times a day (BID) | ORAL | Status: DC

## 2015-08-06 MED ORDER — DIAZEPAM 5 MG PO TABS: 5.0000 mg | ORAL_TABLET | Freq: Two times a day (BID) | ORAL | Status: DC

## 2015-08-06 MED ORDER — KETOROLAC TROMETHAMINE 15 MG/ML IJ SOLN
15.0000 mg | Freq: Once | INTRAMUSCULAR | Status: AC
  Administered 2015-08-06: 15 mg via INTRAVENOUS
  Filled 2015-08-06: qty 1

## 2015-08-06 MED ORDER — HYDROMORPHONE HCL 1 MG/ML IJ SOLN
1.0000 mg | Freq: Once | INTRAMUSCULAR | Status: AC
  Administered 2015-08-06: 1 mg via INTRAVENOUS
  Filled 2015-08-06: qty 1

## 2015-08-06 NOTE — ED Notes (Signed)
Placed  On cardiac monitor  Nasal   o2  At  2 l /  Min      Iv  Ns  tko  20  Angio  l  Hand  1  Att

## 2015-08-06 NOTE — ED Notes (Signed)
Reported pain to Dr. Shyrl Numbers, MD acknowledges, awaiting new orders.

## 2015-08-06 NOTE — ED Notes (Addendum)
Per [facility] - pt from [facility]. Pt went to [facility] for right flank pain that radiates to abd and left flank. Pt reports difficulty urinating, but still able to void. Has not noticed blood in urine. Pt had near syncopal episode and began sweating at [facility]. Pt reports sweating and feeling "really really hot" for the last few days.   Pt recently given Norco and Prednisone to help w/ COPD exacerbation this weekend. Pt also told she has shingles this weekend on right thigh and right buttocks.

## 2015-08-06 NOTE — Discharge Instructions (Signed)
Muscle Strain A muscle strain is an injury that occurs when a muscle is stretched beyond its normal length. Usually a small number of muscle fibers are torn when this happens. Muscle strain is rated in degrees. First-degree strains have the least amount of muscle fiber tearing and pain. Second-degree and third-degree strains have increasingly more tearing and pain.  Usually, recovery from muscle strain takes 1-2 weeks. Complete healing takes 5-6 weeks.  CAUSES  Muscle strain happens when a sudden, violent force placed on a muscle stretches it too far. This may occur with lifting, sports, or a fall.  RISK FACTORS Muscle strain is especially common in athletes.  SIGNS AND SYMPTOMS At the site of the muscle strain, there may be:  Pain.  Bruising.  Swelling.  Difficulty using the muscle due to pain or lack of normal function. DIAGNOSIS  Your health care provider will perform a physical exam and ask about your medical history. TREATMENT  Often, the best treatment for a muscle strain is resting, icing, and applying cold compresses to the injured area.  HOME CARE INSTRUCTIONS   Use the PRICE method of treatment to promote muscle healing during the first 2-3 days after your injury. The PRICE method involves:  Protecting the muscle from being injured again.  Restricting your activity and resting the injured body part.  Icing your injury. To do this, put ice in a plastic bag. Place a towel between your skin and the bag. Then, apply the ice and leave it on from 15-20 minutes each hour. After the third day, switch to moist heat packs.  Apply compression to the injured area with a splint or elastic bandage. Be careful not to wrap it too tightly. This may interfere with blood circulation or increase swelling.  Elevate the injured body part above the level of your heart as often as you can.  Only take over-the-counter or prescription medicines for pain, discomfort, or fever as directed by your  health care provider.  Warming up prior to exercise helps to prevent future muscle strains. SEEK MEDICAL CARE IF:   You have increasing pain or swelling in the injured area.  You have numbness, tingling, or a significant loss of strength in the injured area. MAKE SURE YOU:   Understand these instructions.  Will watch your condition.  Will get help right away if you are not doing well or get worse.   This information is not intended to replace advice given to you by your health care provider. Make sure you discuss any questions you have with your health care provider.   Document Released: 03/16/2005 Document Revised: 01/04/2013 Document Reviewed: 10/13/2012 Elsevier Interactive Patient Education 2016 [employer].  Low Back Strain With Rehab A strain is an injury in which a tendon or muscle is torn. The muscles and tendons of the lower back are vulnerable to strains. However, these muscles and tendons are very strong and require a great force to be injured. Strains are classified into three categories. Grade 1 strains cause pain, but the tendon is not lengthened. Grade 2 strains include a lengthened ligament, due to the ligament being stretched or partially ruptured. With grade 2 strains there is still function, although the function may be decreased. Grade 3 strains involve a complete tear of the tendon or muscle, and function is usually impaired. SYMPTOMS   Pain in the lower back.  Pain that affects one side more than the other.  Pain that gets worse with movement and may be felt in  the hip, buttocks, or back of the thigh.  Muscle spasms of the muscles in the back.  Swelling along the muscles of the back.  Loss of strength of the back muscles.  Crackling sound (crepitation) when the muscles are touched. CAUSES  Lower back strains occur when a force is placed on the muscles or tendons that is greater than they can handle. Common causes of injury include:  Prolonged overuse  of the muscle-tendon units in the lower back, usually from incorrect posture.  A single violent injury or force applied to the back. RISK INCREASES WITH:  Sports that involve twisting forces on the spine or a lot of bending at the waist (football, rugby, weightlifting, bowling, golf, tennis, speed skating, racquetball, swimming, running, gymnastics, diving).  Poor strength and flexibility.  Failure to warm up properly before activity.  Family history of lower back pain or disk disorders.  Previous back injury or surgery (especially fusion).  Poor posture with lifting, especially heavy objects.  Prolonged sitting, especially with poor posture. PREVENTION   Learn and use proper posture when sitting or lifting (maintain proper posture when sitting, lift using the knees and legs, not at the waist).  Warm up and stretch properly before activity.  Allow for adequate recovery between workouts.  Maintain physical fitness:  Strength, flexibility, and endurance.  Cardiovascular fitness. PROGNOSIS  If treated properly, lower back strains usually heal within 6 weeks. RELATED COMPLICATIONS   Recurring symptoms, resulting in a chronic problem.  Chronic inflammation, scarring, and partial muscle-tendon tear.  Delayed healing or resolution of symptoms.  Prolonged disability. TREATMENT  Treatment first involves the use of ice and medicine, to reduce pain and inflammation. The use of strengthening and stretching exercises may help reduce pain with activity. These exercises may be performed at home or with a therapist. Severe injuries may require referral to a therapist for further evaluation and treatment, such as ultrasound. Your caregiver may advise that you wear a back brace or corset, to help reduce pain and discomfort. Often, prolonged bed rest results in greater harm then benefit. Corticosteroid injections may be recommended. However, these should be reserved for the most serious  cases. It is important to avoid using your back when lifting objects. At night, sleep on your back on a firm mattress with a pillow placed under your knees. If non-surgical treatment is unsuccessful, surgery may be needed.  MEDICATION   If pain medicine is needed, nonsteroidal anti-inflammatory medicines (aspirin and ibuprofen), or other minor pain relievers (acetaminophen), are often advised.  Do not take pain medicine for 7 days before surgery.  Prescription pain relievers may be given, if your caregiver thinks they are needed. Use only as directed and only as much as you need.  Ointments applied to the skin may be helpful.  Corticosteroid injections may be given by your caregiver. These injections should be reserved for the most serious cases, because they may only be given a certain number of times. HEAT AND COLD  Cold treatment (icing) should be applied for 10 to 15 minutes every 2 to 3 hours for inflammation and pain, and immediately after activity that aggravates your symptoms. Use ice packs or an ice massage.  Heat treatment may be used before performing stretching and strengthening activities prescribed by your caregiver, physical therapist, or athletic trainer. Use a heat pack or a warm water soak. SEEK MEDICAL CARE IF:   Symptoms get worse or do not improve in 2 to 4 weeks, despite treatment.  You develop  numbness, weakness, or loss of bowel or bladder function.  New, unexplained symptoms develop. (Drugs used in treatment may produce side effects.) EXERCISES  RANGE OF MOTION (ROM) AND STRETCHING EXERCISES - Low Back Strain Most people with lower back pain will find that their symptoms get worse with excessive bending forward (flexion) or arching at the lower back (extension). The exercises which will help resolve your symptoms will focus on the opposite motion.  Your physician, physical therapist or athletic trainer will help you determine which exercises will be most helpful to  resolve your lower back pain. Do not complete any exercises without first consulting with your caregiver. Discontinue any exercises which make your symptoms worse until you speak to your caregiver.  If you have pain, numbness or tingling which travels down into your buttocks, leg or foot, the goal of the therapy is for these symptoms to move closer to your back and eventually resolve. Sometimes, these leg symptoms will get better, but your lower back pain may worsen. This is typically an indication of progress in your rehabilitation. Be very alert to any changes in your symptoms and the activities in which you participated in the 24 hours prior to the change. Sharing this information with your caregiver will allow him/her to most efficiently treat your condition.  These exercises may help you when beginning to rehabilitate your injury. Your symptoms may resolve with or without further involvement from your physician, physical therapist or athletic trainer. While completing these exercises, remember:  Restoring tissue flexibility helps normal motion to return to the joints. This allows healthier, less painful movement and activity.  An effective stretch should be held for at least 30 seconds.  A stretch should never be painful. You should only feel a gentle lengthening or release in the stretched tissue. FLEXION RANGE OF MOTION AND STRETCHING EXERCISES: STRETCH - Flexion, Single Knee to Chest   Lie on a firm bed or floor with both legs extended in front of you.  Keeping one leg in contact with the floor, bring your opposite knee to your chest. Hold your leg in place by either grabbing behind your thigh or at your knee.  Pull until you feel a gentle stretch in your lower back. Hold __________ seconds.  Slowly release your grasp and repeat the exercise with the opposite side. Repeat __________ times. Complete this exercise __________ times per day.  STRETCH - Flexion, Double Knee to Chest   Lie  on a firm bed or floor with both legs extended in front of you.  Keeping one leg in contact with the floor, bring your opposite knee to your chest.  Tense your stomach muscles to support your back and then lift your other knee to your chest. Hold your legs in place by either grabbing behind your thighs or at your knees.  Pull both knees toward your chest until you feel a gentle stretch in your lower back. Hold __________ seconds.  Tense your stomach muscles and slowly return one leg at a time to the floor. Repeat __________ times. Complete this exercise __________ times per day.  STRETCH - Low Trunk Rotation  Lie on a firm bed or floor. Keeping your legs in front of you, bend your knees so they are both pointed toward the ceiling and your feet are flat on the floor.  Extend your arms out to the side. This will stabilize your upper body by keeping your shoulders in contact with the floor.  Gently and slowly drop both knees together  to one side until you feel a gentle stretch in your lower back. Hold for __________ seconds.  Tense your stomach muscles to support your lower back as you bring your knees back to the starting position. Repeat the exercise to the other side. Repeat __________ times. Complete this exercise __________ times per day  EXTENSION RANGE OF MOTION AND FLEXIBILITY EXERCISES: STRETCH - Extension, Prone on Elbows   Lie on your stomach on the floor, a bed will be too soft. Place your palms about shoulder width apart and at the height of your head.  Place your elbows under your shoulders. If this is too painful, stack pillows under your chest.  Allow your body to relax so that your hips drop lower and make contact more completely with the floor.  Hold this position for __________ seconds.  Slowly return to lying flat on the floor. Repeat __________ times. Complete this exercise __________ times per day.  RANGE OF MOTION - Extension, Prone Press Ups  Lie on your  stomach on the floor, a bed will be too soft. Place your palms about shoulder width apart and at the height of your head.  Keeping your back as relaxed as possible, slowly straighten your elbows while keeping your hips on the floor. You may adjust the placement of your hands to maximize your comfort. As you gain motion, your hands will come more underneath your shoulders.  Hold this position __________ seconds.  Slowly return to lying flat on the floor. Repeat __________ times. Complete this exercise __________ times per day.  RANGE OF MOTION- Quadruped, Neutral Spine   Assume a hands and knees position on a firm surface. Keep your hands under your shoulders and your knees under your hips. You may place padding under your knees for comfort.  Drop your head and point your tail bone toward the ground below you. This will round out your lower back like an angry cat. Hold this position for __________ seconds.  Slowly lift your head and release your tail bone so that your back sags into a large arch, like an old horse.  Hold this position for __________ seconds.  Repeat this until you feel limber in your lower back.  Now, find your "sweet spot." This will be the most comfortable position somewhere between the two previous positions. This is your neutral spine. Once you have found this position, tense your stomach muscles to support your lower back.  Hold this position for __________ seconds. Repeat __________ times. Complete this exercise __________ times per day.  STRENGTHENING EXERCISES - Low Back Strain These exercises may help you when beginning to rehabilitate your injury. These exercises should be done near your "sweet spot." This is the neutral, low-back arch, somewhere between fully rounded and fully arched, that is your least painful position. When performed in this safe range of motion, these exercises can be used for people who have either a flexion or extension based injury. These  exercises may resolve your symptoms with or without further involvement from your physician, physical therapist or athletic trainer. While completing these exercises, remember:   Muscles can gain both the endurance and the strength needed for everyday activities through controlled exercises.  Complete these exercises as instructed by your physician, physical therapist or athletic trainer. Increase the resistance and repetitions only as guided.  You may experience muscle soreness or fatigue, but the pain or discomfort you are trying to eliminate should never worsen during these exercises. If this pain does worsen, stop and  make certain you are following the directions exactly. If the pain is still present after adjustments, discontinue the exercise until you can discuss the trouble with your caregiver. STRENGTHENING - Deep Abdominals, Pelvic Tilt  Lie on a firm bed or floor. Keeping your legs in front of you, bend your knees so they are both pointed toward the ceiling and your feet are flat on the floor.  Tense your lower abdominal muscles to press your lower back into the floor. This motion will rotate your pelvis so that your tail bone is scooping upwards rather than pointing at your feet or into the floor.  With a gentle tension and even breathing, hold this position for __________ seconds. Repeat __________ times. Complete this exercise __________ times per day.  STRENGTHENING - Abdominals, Crunches   Lie on a firm bed or floor. Keeping your legs in front of you, bend your knees so they are both pointed toward the ceiling and your feet are flat on the floor. Cross your arms over your chest.  Slightly tip your chin down without bending your neck.  Tense your abdominals and slowly lift your trunk high enough to just clear your shoulder blades. Lifting higher can put excessive stress on the lower back and does not further strengthen your abdominal muscles.  Control your return to the starting  position. Repeat __________ times. Complete this exercise __________ times per day.  STRENGTHENING - Quadruped, Opposite UE/LE Lift   Assume a hands and knees position on a firm surface. Keep your hands under your shoulders and your knees under your hips. You may place padding under your knees for comfort.  Find your neutral spine and gently tense your abdominal muscles so that you can maintain this position. Your shoulders and hips should form a rectangle that is parallel with the floor and is not twisted.  Keeping your trunk steady, lift your right hand no higher than your shoulder and then your left leg no higher than your hip. Make sure you are not holding your breath. Hold this position __________ seconds.  Continuing to keep your abdominal muscles tense and your back steady, slowly return to your starting position. Repeat with the opposite arm and leg. Repeat __________ times. Complete this exercise __________ times per day.  STRENGTHENING - Lower Abdominals, Double Knee Lift  Lie on a firm bed or floor. Keeping your legs in front of you, bend your knees so they are both pointed toward the ceiling and your feet are flat on the floor.  Tense your abdominal muscles to brace your lower back and slowly lift both of your knees until they come over your hips. Be certain not to hold your breath.  Hold __________ seconds. Using your abdominal muscles, return to the starting position in a slow and controlled manner. Repeat __________ times. Complete this exercise __________ times per day.  POSTURE AND BODY MECHANICS CONSIDERATIONS - Low Back Strain Keeping correct posture when sitting, standing or completing your activities will reduce the stress put on different body tissues, allowing injured tissues a chance to heal and limiting painful experiences. The following are general guidelines for improved posture. Your physician or physical therapist will provide you with any instructions specific to  your needs. While reading these guidelines, remember:  The exercises prescribed by your provider will help you have the flexibility and strength to maintain correct postures.  The correct posture provides the best environment for your joints to work. All of your joints have less wear and tear when  properly supported by a spine with good posture. This means you will experience a healthier, less painful body.  Correct posture must be practiced with all of your activities, especially prolonged sitting and standing. Correct posture is as important when doing repetitive low-stress activities (typing) as it is when doing a single heavy-load activity (lifting). RESTING POSITIONS Consider which positions are most painful for you when choosing a resting position. If you have pain with flexion-based activities (sitting, bending, stooping, squatting), choose a position that allows you to rest in a less flexed posture. You would want to avoid curling into a fetal position on your side. If your pain worsens with extension-based activities (prolonged standing, working overhead), avoid resting in an extended position such as sleeping on your stomach. Most people will find more comfort when they rest with their spine in a more neutral position, neither too rounded nor too arched. Lying on a non-sagging bed on your side with a pillow between your knees, or on your back with a pillow under your knees will often provide some relief. Keep in mind, being in any one position for a prolonged period of time, no matter how correct your posture, can still lead to stiffness. PROPER SITTING POSTURE In order to minimize stress and discomfort on your spine, you must sit with correct posture. Sitting with good posture should be effortless for a healthy body. Returning to good posture is a gradual process. Many people can work toward this most comfortably by using various supports until they have the flexibility and strength to maintain  this posture on their own. When sitting with proper posture, your ears will fall over your shoulders and your shoulders will fall over your hips. You should use the back of the chair to support your upper back. Your lower back will be in a neutral position, just slightly arched. You may place a small pillow or folded towel at the base of your lower back for support.  When working at a desk, create an environment that supports good, upright posture. Without extra support, muscles tire, which leads to excessive strain on joints and other tissues. Keep these recommendations in mind: CHAIR:  A chair should be able to slide under your desk when your back makes contact with the back of the chair. This allows you to work closely.  The chair's height should allow your eyes to be level with the upper part of your monitor and your hands to be slightly lower than your elbows. BODY POSITION  Your feet should make contact with the floor. If this is not possible, use a foot rest.  Keep your ears over your shoulders. This will reduce stress on your neck and lower back. INCORRECT SITTING POSTURES  If you are feeling tired and unable to assume a healthy sitting posture, do not slouch or slump. This puts excessive strain on your back tissues, causing more damage and pain. Healthier options include:  Using more support, like a lumbar pillow.  Switching tasks to something that requires you to be upright or walking.  Talking a brief walk.  Lying down to rest in a neutral-spine position. PROLONGED STANDING WHILE SLIGHTLY LEANING FORWARD  When completing a task that requires you to lean forward while standing in one place for a long time, place either foot up on a stationary 2-4 inch high object to help maintain the best posture. When both feet are on the ground, the lower back tends to lose its slight inward curve. If this curve flattens (  or becomes too large), then the back and your other joints will experience  too much stress, tire more quickly, and can cause pain. CORRECT STANDING POSTURES Proper standing posture should be assumed with all daily activities, even if they only take a few moments, like when brushing your teeth. As in sitting, your ears should fall over your shoulders and your shoulders should fall over your hips. You should keep a slight tension in your abdominal muscles to brace your spine. Your tailbone should point down to the ground, not behind your body, resulting in an over-extended swayback posture.  INCORRECT STANDING POSTURES  Common incorrect standing postures include a forward head, locked knees and/or an excessive swayback. WALKING Walk with an upright posture. Your ears, shoulders and hips should all line-up. PROLONGED ACTIVITY IN A FLEXED POSITION When completing a task that requires you to bend forward at your waist or lean over a low surface, try to find a way to stabilize 3 out of 4 of your limbs. You can place a hand or elbow on your thigh or rest a knee on the surface you are reaching across. This will provide you more stability so that your muscles do not fatigue as quickly. By keeping your knees relaxed, or slightly bent, you will also reduce stress across your lower back. CORRECT LIFTING TECHNIQUES DO :   Assume a wide stance. This will provide you more stability and the opportunity to get as close as possible to the object which you are lifting.  Tense your abdominals to brace your spine. Bend at the knees and hips. Keeping your back locked in a neutral-spine position, lift using your leg muscles. Lift with your legs, keeping your back straight.  Test the weight of unknown objects before attempting to lift them.  Try to keep your elbows locked down at your sides in order get the best strength from your shoulders when carrying an object.  Always ask for help when lifting heavy or awkward objects. INCORRECT LIFTING TECHNIQUES DO NOT:   Lock your knees when  lifting, even if it is a small object.  Bend and twist. Pivot at your feet or move your feet when needing to change directions.  Assume that you can safely pick up even a paper clip without proper posture.   This information is not intended to replace advice given to you by your health care provider. Make sure you discuss any questions you have with your health care provider.   Document Released: 03/16/2005 Document Revised: 04/06/2014 Document Reviewed: 06/28/2008 Elsevier Interactive Patient Education 2016 [employer] RICE for Routine Care of Injuries Theroutine careofmanyinjuriesincludes rest, ice, compression, and elevation (RICE therapy). RICE therapy is often recommended for injuries to soft tissues, such as a muscle strain, ligament injuries, bruises, and overuse injuries. It can also be used for some bony injuries. Using RICE therapy can help to relieve pain, lessen swelling, and enable your body to heal. Rest Rest is required to allow your body to heal. This usually involves reducing your normal activities and avoiding use of the injured part of your body. Generally, you can return to your normal activities when you are comfortable and have been given permission by your health care provider. Ice Icing your injury helps to keep the swelling down, and it lessens pain. Do not apply ice directly to your skin.  Put ice in a plastic bag.  Place a towel between your skin and the bag.  Leave the ice on for 20 minutes, 2-3 times  a day. Do this for as long as you are directed by your health care provider. Compression Compression means putting pressure on the injured area. Compression helps to keep swelling down, gives support, and helps with discomfort. Compression may be done with an elastic bandage. If an elastic bandage has been applied, follow these general tips:  Remove and reapply the bandage every 3-4 hours or as directed by your health care provider.  Make sure the bandage  is not wrapped too tightly, because this can cut off circulation. If part of your body beyond the bandage becomes blue, numb, cold, swollen, or more painful, your bandage is most likely too tight. If this occurs, remove your bandage and reapply it more loosely.  See your health care provider if the bandage seems to be making your problems worse rather than better. Elevation Elevation means keeping the injured area raised. This helps to lessen swelling and decrease pain. If possible, your injured area should be elevated at or above the level of your heart or the center of your chest. WHEN SHOULD I SEEK MEDICAL CARE? You should seek medical care if:  Your pain and swelling continue.  Your symptoms are getting worse rather than improving. These symptoms may indicate that further evaluation or further X-rays are needed. Sometimes, X-rays may not show a small broken bone (fracture) until a number of days later. Make a follow-up appointment with your health care provider. WHEN SHOULD I SEEK IMMEDIATE MEDICAL CARE? You should seek immediate medical care if:  You have sudden severe pain at or below the area of your injury.  You have redness or increased swelling around your injury.  You have tingling or numbness at or below the area of your injury that does not improve after you remove the elastic bandage.   This information is not intended to replace advice given to you by your health care provider. Make sure you discuss any questions you have with your health care provider.   Document Released: 06/28/2000 Document Revised: 12/05/2014 Document Reviewed: 02/21/2014 Elsevier Interactive Patient Education [employer]

## 2015-08-06 NOTE — ED Notes (Addendum)
Pt  Reports  Pain  r  Side   She  Reports    A cough  As  Well    the  Pt  Reports  The    Pain  Is  Worse   When  She  Coughs  And  When  She  Takes a   Deep  Breath    Pt  Also  Reports  Feeling  Hot  And  Dizzy

## 2015-08-06 NOTE — ED Provider Notes (Addendum)
CSN: [id number]     Arrival date & time 08/06/15  1612 History   First MD Initiated Contact with Patient 08/06/15 1617     Chief Complaint  Patient presents with  . Dysuria  . Near Syncope     (Consider location/radiation/quality/duration/timing/severity/associated sxs/prior Treatment) HPI Comments: PT comes to the ER from the urgent care with cc of back pain. Pt has hx of COPD, DM, chronic pain. She reports that she has been unwell since after Easter. Pt started with zoster infection and then had COPD flair. Yday, while coughing she felt a pop and started having back pain. Her pain is located in the R flank region. The pain is worse with any movement - sitting up, turning etc. And also with palpation of that region. She also reports pain getting worse with cough and deep inspiration. She had dysuria over the weekend, currently no uti like symptoms.   ROS 10 Systems reviewed and are negative for acute change except as noted in the HPI.   Patient is a 60 y.o. female presenting with dysuria and near-syncope. The history is provided by the patient.  Dysuria Near Syncope    Past Medical History  Diagnosis Date  . Hypertension   . Heart murmur   . Asthma   . Emphysema   . Diabetes mellitus   . Hyperlipidemia   . Allergic rhinitis   . Chronic headache   . Neuropathy (HCC)   . Grave's disease   . Herniated disc   . COPD (chronic obstructive pulmonary disease) (HCC)   . Menopause   . Hernia, umbilical   . Anxiety   . Depression   . Back pain, chronic   . Shingles   . Herniated disc   . Arthritis     rheumatoid  . 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 Generalized anxiety disorder 05/26/2012  . Chronic back pain 05/27/2012    Secondary to L4-5 herniated disc per patient  . OSA (obstructive sleep apnea)     has a cpap  . Wears glasses   . Wears dentures     top  . Tear of lateral meniscus of right knee  10/13/2013  . Alcoholism (HCC)   . Sleep apnea     wear c-pap  . Allergy   . Emphysema of lung (HCC)   . GERD (gastroesophageal reflux disease)   . Osteoporosis   . Rheumatoid arthritis [facility])    Past Surgical History  Procedure Laterality Date  . Cholecystectomy    . Tubal ligation    . Foot surgery      cyst rt foot  . Laparoscopy      age 59  . Eye surgery      eye back in socker-rt  . Knee arthroscopy with lateral menisectomy Right 10/13/2013    Procedure: RIGHT KNEE ARTHROSCOPY WITH PARTIAL LATERAL MENISECTOMY/DEBRIDEMENT/SHAVING ;  Surgeon: Eulas Post, MD;  Location: [facility];  Service: Orthopedics;  Laterality: Right;  . Left heart catheterization with coronary angiogram N/A 05/30/2012    Procedure: LEFT HEART CATHETERIZATION WITH CORONARY ANGIOGRAM;  Surgeon: [name], MD;  Location[facility] C[facility] LAB;  Service: Cardiovascular;  Laterality: N/A;   Family History  Problem Relation Age of Onset  . Emphysema Mother   . Allergies Mother   . Asthma Mother   . Heart disease Mother   . Rheum arthritis Mother   . Diabetes Mother   .  Kidney disease Mother   . Allergies Daughter   . Asthma Brother   . Breast cancer Other     aunt  . Lung cancer Brother   . Throat cancer Brother   . Colon cancer Neg Hx   . Colon polyps Neg Hx   . Esophageal cancer Neg Hx   . Rectal cancer Neg Hx   . Stomach cancer Neg Hx    Social History  Substance Use Topics  . Smoking status: Current Every Day Smoker -- 1.00 packs/day for 40 years    Types: Cigarettes  . Smokeless tobacco: Never Used  . Alcohol Use: No   OB History    Gravida Para Term Preterm AB TAB SAB Ectopic Multiple Living   5 3   2  2   3      Review of Systems  Cardiovascular: Positive for near-syncope.  Genitourinary: Positive for dysuria.      Allergies  Ciprofloxacin; Fluocinolone; and Penicillins  Home Medications   Prior to Admission medications   Medication Sig Start Date End Date  Taking? Authorizing Provider  acyclovir (ZOVIRAX) 400 MG tablet Take 400 mg by mouth 2 (two) times daily.     Historical Provider, MD  albuterol (PROAIR HFA) 108 (90 BASE) MCG/ACT inhaler Inhale 2 puffs into the lungs 4 (four) times daily. For COPD    Historical Provider, MD  atorvastatin (LIPITOR) 40 MG tablet Take 1 tablet (40 mg total) by mouth daily. 10/06/12   12/07/12, DO  BuPROPion HCl (WELLBUTRIN PO) Take 1 tablet by mouth 2 (two) times daily.    Historical Provider, MD  busPIRone (BUSPAR) 10 MG tablet Take 20 mg by mouth 3 (three) times daily.     Historical Provider, MD  cyclobenzaprine (FLEXERIL) 10 MG tablet 1 tablet 3 times a day 04/14/15   04/16/15, PA  cycloSPORINE (RESTASIS) 0.05 % ophthalmic emulsion Place 1 drop into both eyes 2 (two) times daily.    Historical Provider, MD  diazepam (VALIUM) 5 MG tablet Take 1 tablet (5 mg total) by mouth 2 (two) times daily. 08/06/15   10/06/15, MD  diclofenac sodium (VOLTAREN) 1 % GEL Apply 2 g topically 4 (four) times daily as needed (pain).     Historical Provider, MD  FLUoxetine (PROZAC) 20 MG capsule Take 60 mg by mouth every morning.     Historical Provider, MD  glipiZIDE (GLUCOTROL) 10 MG tablet Take 10 mg by mouth 2 (two) times daily before a meal.    Historical Provider, MD  HYDROcodone-acetaminophen (NORCO) 10-325 MG tablet Take 1 tablet by mouth every 6 (six) hours as needed. 08/10/15   08/12/15, MD  HYDROcodone-homatropine (HYCODAN) 5-1.5 MG/5ML syrup Take 5 mLs by mouth every 6 (six) hours as needed for cough. 08/03/15   10/03/15, PA-C  ipratropium-albuterol (DUONEB) 0.5-2.5 (3) MG/3ML SOLN Take 3 mLs by nebulization every 6 (six) hours as needed (Asthma).    Historical Provider, MD  levothyroxine (SYNTHROID, LEVOTHROID) 175 MCG tablet Take 175 mcg by mouth every morning.     Historical Provider, MD  lidocaine (LIDODERM) 5 % Place 1 patch onto the skin daily. Remove & Discard patch within 12 hours or as  directed by MD 08/03/15   10/03/15, PA-C  lisinopril (PRINIVIL,ZESTRIL) 10 MG tablet Take 10 mg by mouth daily.    Historical Provider, MD  loratadine (CLARITIN) 10 MG tablet Take 10 mg by mouth daily as needed for allergies.  Historical Provider, MD  meloxicam (MOBIC) 15 MG tablet Take 15 mg by mouth daily.    Historical Provider, MD  metFORMIN (GLUCOPHAGE) 1000 MG tablet Take 1,000 mg by mouth 2 (two) times daily.     Historical Provider, MD  methocarbamol (ROBAXIN) 500 MG tablet Take 1 tablet (500 mg total) by mouth 2 (two) times daily. 12/07/14   Fayrene Helper, PA-C  naproxen (NAPROSYN) 500 MG tablet Take 1 tablet (500 mg total) by mouth 2 (two) times daily. 08/10/15   Charm Rings, MD  Olopatadine HCl (PATADAY) 0.2 % SOLN Apply 1 drop to eye daily. 12/21/14   Charm Rings, MD  predniSONE (STERAPRED UNI-PAK 48 TAB) 5 MG (48) TBPK tablet Take 1 tablet (5 mg total) by mouth daily. 08/03/15   Riki Sheer, PA-C  pregabalin (LYRICA) 75 MG capsule Take 75 mg by mouth 3 (three) times daily.    Historical Provider, MD  QUEtiapine (SEROQUEL) 25 MG tablet Take 50 mg by mouth at bedtime.     Historical Provider, MD  sennosides-docusate sodium (SENOKOT-S) 8.6-50 MG tablet Take 2 tablets by mouth daily. Patient taking differently: Take 2 tablets by mouth daily as needed for constipation.  10/13/13   Teryl Lucy, MD  tiotropium (SPIRIVA) 18 MCG inhalation capsule Place 1 capsule (18 mcg total) into inhaler and inhale daily. 12/18/14   Valentino Nose, MD  traMADol (ULTRAM) 50 MG tablet Take 50 mg by mouth every 6 (six) hours as needed for moderate pain.  02/05/14   Historical Provider, MD  verapamil (CALAN) 40 MG tablet Take 40 mg by mouth 3 (three) times daily.    Historical Provider, MD   BP 138/83 mmHg  Pulse 75  Temp(Src) 98.5 F (36.9 C) (Oral)  Resp 15  SpO2 99% Physical Exam  Constitutional: She is oriented to person, place, and time. She appears well-developed.  HENT:  Head: Normocephalic  and atraumatic.  Eyes: Conjunctivae and EOM are normal. Pupils are equal, round, and reactive to light.  Neck: Normal range of motion. Neck supple.  Cardiovascular: Normal rate, regular rhythm and normal heart sounds.   Pulmonary/Chest: Effort normal and breath sounds normal. No respiratory distress. She has no wheezes.  Abdominal: Soft. Bowel sounds are normal. She exhibits no distension. There is no tenderness. There is no rebound and no guarding.  Musculoskeletal:  Pt has tenderness over the R flank region with palpation.  Neurological: She is alert and oriented to person, place, and time.  Skin: Skin is warm and dry. No rash noted.  Nursing note and vitals reviewed.   ED Course  Procedures (including critical care time) Labs Review Labs Reviewed  URINE CULTURE - Abnormal; Notable for the following:    Culture MULTIPLE SPECIES PRESENT, SUGGEST RECOLLECTION (*)    All other components within normal limits  CBC WITH DIFFERENTIAL/PLATELET - Abnormal; Notable for the following:    WBC 16.0 (*)    Neutro Abs 13.8 (*)    All other components within normal limits  PROTIME-INR - Abnormal; Notable for the following:    Prothrombin Time 15.8 (*)    All other components within normal limits  URINALYSIS, ROUTINE W REFLEX MICROSCOPIC (NOT AT [facility])  COMPREHENSIVE METABOLIC PANEL    Imaging Review No results found. I have personally reviewed and evaluated these images and lab results as part of my medical decision-making.   EKG Interpretation   Date/Time:  Tuesday Aug 06 2015 16:21:20 EDT Ventricular Rate:  72 PR Interval:  160 QRS  Duration: 96 QT Interval:  388 QTC Calculation: 425 R Axis:   19 Text Interpretation:  Sinus rhythm Anteroseptal infarct, old Borderline T  abnormalities, inferior leads No acute changes Nonspecific ST and T wave  abnormality Confirmed by Rhunette Croft, MD, Janey Genta [id number]) on 08/06/2015 4:39:00  PM      MDM   Final diagnoses:  Lumbar strain, initial  encounter  Musculoskeletal pain    Pt comes in with cc of back pain. She has new back pain, started due to coughing, she remembers the exact event when the pain started, and it was evoked by a coughing spell. Pt has reproducible back pain right now. Lungs are clear. VSS and WNL. There is no hx of PE, DVT and no significant risk factors for them. No renal stone hx and there is no current uti like symptoms - with clean urine.  - Will get pain control. CXR and basic labs ordered and are neg, Pain reproducible with palpation, pre-test probability for PE is extremely low, and we will not dimer her. Strict ER return precautions have been discussed, and patient is agreeing with the plan and is comfortable with the workup done and the recommendations from the ER.    Derwood Kaplan, MD 08/06/15 9798  Derwood Kaplan, MD 08/19/15 9211

## 2015-08-06 NOTE — ED Provider Notes (Signed)
CSN: [id number]     Arrival date & time 08/06/15  1340 History   First MD Initiated Contact with Patient 08/06/15 1439     Chief Complaint  Patient presents with  . Cough   (Consider location/radiation/quality/duration/timing/severity/associated sxs/prior Treatment) Patient is a 60 y.o. female presenting with cough. The history is provided by the patient.  Cough Cough characteristics:  Non-productive and harsh Severity:  Moderate Onset quality:  Sudden Timing:  Constant Progression:  Unchanged Chronicity:  New Smoker: yes   Relieved by:  Nothing Worsened by:  Nothing tried Ineffective treatments:  None tried Associated symptoms: chest pain and shortness of breath   Associated symptoms: no fever   Associated symptoms comment:  Seen 5/7 with mult pain complaints.   Past Medical History  Diagnosis Date  . Hypertension   . Heart murmur   . Asthma   . Emphysema   . Diabetes mellitus   . Hyperlipidemia   . Allergic rhinitis   . Chronic headache   . Neuropathy (HCC)   . Grave's disease   . Herniated disc   . COPD (chronic obstructive pulmonary disease) (HCC)   . Menopause   . Hernia, umbilical   . Anxiety   . Depression   . Back pain, chronic   . Shingles   . Herniated disc   . Arthritis     rheumatoid  . 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 Generalized anxiety disorder 05/26/2012  . Chronic back pain 05/27/2012    Secondary to L4-5 herniated disc per patient  . OSA (obstructive sleep apnea)     has a cpap  . Wears glasses   . Wears dentures     top  . Tear of lateral meniscus of right knee 10/13/2013  . Alcoholism (HCC)   . Sleep apnea     wear c-pap  . Allergy   . Emphysema of lung (HCC)   . GERD (gastroesophageal reflux disease)   . Osteoporosis   . Rheumatoid arthritis [facility])    Past Surgical History  Procedure Laterality Date  . Cholecystectomy    . Tubal ligation    . Foot surgery     cyst rt foot  . Laparoscopy      age 44  . Eye surgery      eye back in socker-rt  . Knee arthroscopy with lateral menisectomy Right 10/13/2013    Procedure: RIGHT KNEE ARTHROSCOPY WITH PARTIAL LATERAL MENISECTOMY/DEBRIDEMENT/SHAVING ;  Surgeon: Eulas Post, MD;  Location: [facility];  Service: Orthopedics;  Laterality: Right;  . Left heart catheterization with coronary angiogram N/A 05/30/2012    Procedure: LEFT HEART CATHETERIZATION WITH CORONARY ANGIOGRAM;  Surgeon: [name], MD;  Location: [facility] CATH LAB;  Service: Cardiovascular;  Laterality: N/A;   Family History  Problem Relation Age of Onset  . Emphysema Mother   . Allergies Mother   . Asthma Mother   . Heart disease Mother   . Rheum arthritis Mother   . Diabetes Mother   . Kidney disease Mother   . Allergies Daughter   . Asthma Brother   . Breast cancer Other     aunt  . Lung cancer Brother   . Throat cancer Brother   . Colon cancer Neg Hx   . Colon polyps Neg Hx   . Esophageal cancer Neg Hx   . Rectal cancer Neg Hx   . Stomach cancer Neg Hx  Social History  Substance Use Topics  . Smoking status: Current Every Day Smoker -- 1.00 packs/day for 40 years    Types: Cigarettes  . Smokeless tobacco: Never Used  . Alcohol Use: No   OB History    Gravida Para Term Preterm AB TAB SAB Ectopic Multiple Living   5 3   2  2   3      Review of Systems  Constitutional: Negative.  Negative for fever.  Respiratory: Positive for cough and shortness of breath.   Cardiovascular: Positive for chest pain.  Gastrointestinal: Positive for abdominal pain.  Genitourinary: Positive for flank pain.  Musculoskeletal: Positive for back pain.  All other systems reviewed and are negative.   Allergies  Ciprofloxacin; Fluocinolone; and Penicillins  Home Medications   Prior to Admission medications   Medication Sig Start Date End Date Taking? Authorizing Provider  acyclovir (ZOVIRAX) 400 MG tablet Take 400 mg  by mouth 2 (two) times daily.     Historical Provider, MD  albuterol (PROAIR HFA) 108 (90 BASE) MCG/ACT inhaler Inhale 2 puffs into the lungs 4 (four) times daily. For COPD    Historical Provider, MD  atorvastatin (LIPITOR) 40 MG tablet Take 1 tablet (40 mg total) by mouth daily. 10/06/12   12/07/12, DO  BuPROPion HCl (WELLBUTRIN PO) Take 1 tablet by mouth 2 (two) times daily.    Historical Provider, MD  busPIRone (BUSPAR) 10 MG tablet Take 20 mg by mouth 3 (three) times daily.     Historical Provider, MD  cyclobenzaprine (FLEXERIL) 10 MG tablet 1 tablet 3 times a day 04/14/15   04/16/15, PA  cycloSPORINE (RESTASIS) 0.05 % ophthalmic emulsion Place 1 drop into both eyes 2 (two) times daily.    Historical Provider, MD  diazepam (VALIUM) 5 MG tablet Take 1 tablet (5 mg total) by mouth 2 (two) times daily. 08/06/15   10/06/15, MD  diclofenac sodium (VOLTAREN) 1 % GEL Apply 2 g topically 4 (four) times daily as needed (pain).     Historical Provider, MD  FLUoxetine (PROZAC) 20 MG capsule Take 60 mg by mouth every morning.     Historical Provider, MD  glipiZIDE (GLUCOTROL) 10 MG tablet Take 10 mg by mouth 2 (two) times daily before a meal.    Historical Provider, MD  HYDROcodone-acetaminophen (NORCO/VICODIN) 5-325 MG tablet Take 2 tablets by mouth every 6 (six) hours as needed. 04/14/15   04/16/15, PA  HYDROcodone-homatropine [facility]) 5-1.5 MG/5ML syrup Take 5 mLs by mouth every 6 (six) hours as needed for cough. 08/03/15   10/03/15, PA-C  ipratropium-albuterol (DUONEB) 0.5-2.5 (3) MG/3ML SOLN Take 3 mLs by nebulization every 6 (six) hours as needed (Asthma).    Historical Provider, MD  levothyroxine (SYNTHROID, LEVOTHROID) 175 MCG tablet Take 175 mcg by mouth every morning.     Historical Provider, MD  lidocaine (LIDODERM) 5 % Place 1 patch onto the skin daily. Remove & Discard patch within 12 hours or as directed by MD 08/03/15   10/03/15, PA-C  lisinopril  (PRINIVIL,ZESTRIL) 10 MG tablet Take 10 mg by mouth daily.    Historical Provider, MD  loratadine (CLARITIN) 10 MG tablet Take 10 mg by mouth daily as needed for allergies.    Historical Provider, MD  meloxicam (MOBIC) 15 MG tablet Take 15 mg by mouth daily.    Historical Provider, MD  metFORMIN (GLUCOPHAGE) 1000 MG tablet Take 1,000 mg by mouth 2 (two) times daily.  Historical Provider, MD  methocarbamol (ROBAXIN) 500 MG tablet Take 1 tablet (500 mg total) by mouth 2 (two) times daily. 12/07/14   Fayrene Helper, PA-C  naproxen (NAPROSYN) 500 MG tablet Take 1 tablet (500 mg total) by mouth 2 (two) times daily. 08/06/15   Derwood Kaplan, MD  Olopatadine HCl (PATADAY) 0.2 % SOLN Apply 1 drop to eye daily. 12/21/14   Charm Rings, MD  predniSONE (STERAPRED UNI-PAK 48 TAB) 5 MG (48) TBPK tablet Take 1 tablet (5 mg total) by mouth daily. 08/03/15   Riki Sheer, PA-C  pregabalin (LYRICA) 75 MG capsule Take 75 mg by mouth 3 (three) times daily.    Historical Provider, MD  QUEtiapine (SEROQUEL) 25 MG tablet Take 50 mg by mouth at bedtime.     Historical Provider, MD  sennosides-docusate sodium (SENOKOT-S) 8.6-50 MG tablet Take 2 tablets by mouth daily. Patient taking differently: Take 2 tablets by mouth daily as needed for constipation.  10/13/13   Teryl Lucy, MD  tiotropium (SPIRIVA) 18 MCG inhalation capsule Place 1 capsule (18 mcg total) into inhaler and inhale daily. 12/18/14   Valentino Nose, MD  traMADol (ULTRAM) 50 MG tablet Take 50 mg by mouth every 6 (six) hours as needed for moderate pain.  02/05/14   Historical Provider, MD  verapamil (CALAN) 40 MG tablet Take 40 mg by mouth 3 (three) times daily.    Historical Provider, MD   Meds Ordered and Administered this Visit  Medications - No data to display  BP 147/89 mmHg  Pulse 97  Temp(Src) 98.1 F (36.7 C) (Oral)  Resp 18  SpO2 97% No data found.   Physical Exam  ED Course  Procedures (including critical care time)  Labs Review Labs  Reviewed - No data to display  Imaging Review Dg Chest 2 View  08/06/2015  CLINICAL DATA:  Right-sided pleuritic chest pain.  COPD. EXAM: CHEST  2 VIEW COMPARISON:  Multiple exams, including 01/23/2015 FINDINGS: Old right rib deformities from healed fractures. The lungs appear clear. Cardiac and mediastinal margins appear normal. Mild blunting of both posterior costophrenic angles. No pulmonary edema. IMPRESSION: 1. Blunting of both posterior costophrenic angles is new compared to 12/16/14 an suspicious for trace bilateral pleural effusions. No additional significant findings. Electronically Signed   By: Gaylyn Rong M.D.   On: 08/06/2015 17:42     Visual Acuity Review  Right Eye Distance:   Left Eye Distance:   Bilateral Distance:    Right Eye Near:   Left Eye Near:    Bilateral Near:         MDM   1. Vasovagal near-syncope    Pt sent for near syncopal episode from right flank pain-poss vagal related, using pain meds from mult chronic pain related issues recently. Pt was in process of getting cxr  When she told tech that she felt like passing out.    Linna Hoff, MD 08/06/15 2109

## 2015-08-07 LAB — URINE CULTURE

## 2015-08-10 ENCOUNTER — Encounter (HOSPITAL_COMMUNITY): Payer: Self-pay | Admitting: Emergency Medicine

## 2015-08-10 ENCOUNTER — Ambulatory Visit (INDEPENDENT_AMBULATORY_CARE_PROVIDER_SITE_OTHER): Payer: Medicaid Other

## 2015-08-10 ENCOUNTER — Ambulatory Visit (HOSPITAL_COMMUNITY)
Admission: EM | Admit: 2015-08-10 | Discharge: 2015-08-10 | Disposition: A | Discharge status: Home / Self Care | Payer: Medicaid Other | Attending: Emergency Medicine | Admitting: Emergency Medicine

## 2015-08-10 DIAGNOSIS — R0781 Pleurodynia: Secondary | ICD-10-CM

## 2015-08-10 MED ORDER — NAPROXEN 500 MG PO TABS: 500.0000 mg | ORAL_TABLET | Freq: Two times a day (BID) | ORAL | Status: DC

## 2015-08-10 MED ORDER — HYDROCODONE-ACETAMINOPHEN 10-325 MG PO TABS: 1.0000 | ORAL_TABLET | Freq: Four times a day (QID) | ORAL | Status: DC | PRN

## 2015-08-10 NOTE — Discharge Instructions (Signed)
Your pain is coming from your rib cage. There are no broken bones. This will slowly improve as your cough improves. I have refilled your pain medicine and Naprosyn. Follow-up with your primary care doctor as scheduled.

## 2015-08-10 NOTE — ED Provider Notes (Signed)
CSN: [id number]     Arrival date & time 08/10/15  1526 History   First MD Initiated Contact with Patient 08/10/15 1621     Chief Complaint  Patient presents with  . Flank Pain   (Consider location/radiation/quality/duration/timing/severity/associated sxs/prior Treatment) HPI  She is a 60 year old woman here for evaluation of right side pain. She states this is been present for several weeks now. She was seen here approximately 10 days ago with concern for flare of her "chronic shingles."  She was also found to have a COPD exacerbation at that time. She followed up several days later with worsening pain in the right side. She was transferred to the emergency room and diagnosed with a pulled muscle as well as small pleural effusions. She has been taking a prednisone taper with some improvement. She is out of her chronic pain medication from her PCP. She does have an appointment with her primary care doctor on Wednesday.   Today, she reports continued pain in the right side. This will sometimes radiate across to the left. It is worse with coughing. No new fevers or chest pain.   Past Medical History  Diagnosis Date  . Hypertension   . Heart murmur   . Asthma   . Emphysema   . Diabetes mellitus   . Hyperlipidemia   . Allergic rhinitis   . Chronic headache   . Neuropathy (HCC)   . Grave's disease   . Herniated disc   . COPD (chronic obstructive pulmonary disease) (HCC)   . Menopause   . Hernia, umbilical   . Anxiety   . Depression   . Back pain, chronic   . Shingles   . Herniated disc   . Arthritis     rheumatoid  . 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 Generalized anxiety disorder 05/26/2012  . Chronic back pain 05/27/2012    Secondary to L4-5 herniated disc per patient  . OSA (obstructive sleep apnea)     has a cpap  . Wears glasses   . Wears dentures     top  . Tear of lateral meniscus of right knee 10/13/2013  .  Alcoholism (HCC)   . Sleep apnea     wear c-pap  . Allergy   . Emphysema of lung (HCC)   . GERD (gastroesophageal reflux disease)   . Osteoporosis   . Rheumatoid arthritis [facility])    Past Surgical History  Procedure Laterality Date  . Cholecystectomy    . Tubal ligation    . Foot surgery      cyst rt foot  . Laparoscopy      age 18  . Eye surgery      eye back in socker-rt  . Knee arthroscopy with lateral menisectomy Right 10/13/2013    Procedure: RIGHT KNEE ARTHROSCOPY WITH PARTIAL LATERAL MENISECTOMY/DEBRIDEMENT/SHAVING ;  Surgeon: Eulas Post, MD;  Location: [facility];  Service: Orthopedics;  Laterality: Right;  . Left heart catheterization with coronary angiogram N/A 05/30/2012    Procedure: LEFT HEART CATHETERIZATION WITH CORONARY ANGIOGRAM;  Surgeon: [name], MD;  Location: [facility] CATH LAB;  Service: Cardiovascular;  Laterality: N/A;   Family History  Problem Relation Age of Onset  . Emphysema Mother   . Allergies Mother   . Asthma Mother   . Heart disease Mother   . Rheum arthritis Mother   . Diabetes Mother   . Kidney disease Mother   .  Allergies Daughter   . Asthma Brother   . Breast cancer Other     aunt  . Lung cancer Brother   . Throat cancer Brother   . Colon cancer Neg Hx   . Colon polyps Neg Hx   . Esophageal cancer Neg Hx   . Rectal cancer Neg Hx   . Stomach cancer Neg Hx    Social History  Substance Use Topics  . Smoking status: Current Every Day Smoker -- 1.00 packs/day for 40 years    Types: Cigarettes  . Smokeless tobacco: Never Used  . Alcohol Use: No   OB History    Gravida Para Term Preterm AB TAB SAB Ectopic Multiple Living   5 3   2  2   3      Review of Systems As in history of present illness Allergies  Ciprofloxacin; Fluocinolone; and Penicillins  Home Medications   Prior to Admission medications   Medication Sig Start Date End Date Taking? Authorizing Provider  acyclovir (ZOVIRAX) 400 MG tablet Take 400  mg by mouth 2 (two) times daily.    Yes Historical Provider, MD  albuterol (PROAIR HFA) 108 (90 BASE) MCG/ACT inhaler Inhale 2 puffs into the lungs 4 (four) times daily. For COPD   Yes Historical Provider, MD  atorvastatin (LIPITOR) 40 MG tablet Take 1 tablet (40 mg total) by mouth daily. 10/06/12  Yes 12/07/12, DO  BuPROPion HCl (WELLBUTRIN PO) Take 1 tablet by mouth 2 (two) times daily.   Yes Historical Provider, MD  cycloSPORINE (RESTASIS) 0.05 % ophthalmic emulsion Place 1 drop into both eyes 2 (two) times daily.   Yes Historical Provider, MD  diclofenac sodium (VOLTAREN) 1 % GEL Apply 2 g topically 4 (four) times daily as needed (pain).    Yes Historical Provider, MD  FLUoxetine (PROZAC) 20 MG capsule Take 60 mg by mouth every morning.    Yes Historical Provider, MD  glipiZIDE (GLUCOTROL) 10 MG tablet Take 10 mg by mouth 2 (two) times daily before a meal.   Yes Historical Provider, MD  ipratropium-albuterol (DUONEB) 0.5-2.5 (3) MG/3ML SOLN Take 3 mLs by nebulization every 6 (six) hours as needed (Asthma).   Yes Historical Provider, MD  lisinopril (PRINIVIL,ZESTRIL) 10 MG tablet Take 10 mg by mouth daily.   Yes Historical Provider, MD  loratadine (CLARITIN) 10 MG tablet Take 10 mg by mouth daily as needed for allergies.   Yes Historical Provider, MD  meloxicam (MOBIC) 15 MG tablet Take 15 mg by mouth daily.   Yes Historical Provider, MD  metFORMIN (GLUCOPHAGE) 1000 MG tablet Take 1,000 mg by mouth 2 (two) times daily.    Yes Historical Provider, MD  methocarbamol (ROBAXIN) 500 MG tablet Take 1 tablet (500 mg total) by mouth 2 (two) times daily. 12/07/14  Yes 02/06/15, PA-C  predniSONE (STERAPRED UNI-PAK 48 TAB) 5 MG (48) TBPK tablet Take 1 tablet (5 mg total) by mouth daily. 08/03/15  Yes 10/03/15 Young, PA-C  QUEtiapine (SEROQUEL) 25 MG tablet Take 50 mg by mouth at bedtime.    Yes Historical Provider, MD  tiotropium (SPIRIVA) 18 MCG inhalation capsule Place 1 capsule (18 mcg total) into  inhaler and inhale daily. 12/18/14  Yes 12/20/14, MD  traMADol (ULTRAM) 50 MG tablet Take 50 mg by mouth every 6 (six) hours as needed for moderate pain.  02/05/14  Yes Historical Provider, MD  verapamil (CALAN) 40 MG tablet Take 40 mg by mouth 3 (three) times daily.  Yes Historical Provider, MD  busPIRone (BUSPAR) 10 MG tablet Take 20 mg by mouth 3 (three) times daily.     Historical Provider, MD  cyclobenzaprine (FLEXERIL) 10 MG tablet 1 tablet 3 times a day 04/14/15   Tharon Aquas, PA  diazepam (VALIUM) 5 MG tablet Take 1 tablet (5 mg total) by mouth 2 (two) times daily. 08/06/15   Derwood Kaplan, MD  HYDROcodone-acetaminophen (NORCO) 10-325 MG tablet Take 1 tablet by mouth every 6 (six) hours as needed. 08/10/15   Charm Rings, MD  HYDROcodone-homatropine (HYCODAN) 5-1.5 MG/5ML syrup Take 5 mLs by mouth every 6 (six) hours as needed for cough. 08/03/15   Riki Sheer, PA-C  levothyroxine (SYNTHROID, LEVOTHROID) 175 MCG tablet Take 175 mcg by mouth every morning.     Historical Provider, MD  lidocaine (LIDODERM) 5 % Place 1 patch onto the skin daily. Remove & Discard patch within 12 hours or as directed by MD 08/03/15   Riki Sheer, PA-C  naproxen (NAPROSYN) 500 MG tablet Take 1 tablet (500 mg total) by mouth 2 (two) times daily. 08/10/15   Charm Rings, MD  Olopatadine HCl (PATADAY) 0.2 % SOLN Apply 1 drop to eye daily. 12/21/14   Charm Rings, MD  pregabalin (LYRICA) 75 MG capsule Take 75 mg by mouth 3 (three) times daily.    Historical Provider, MD  sennosides-docusate sodium (SENOKOT-S) 8.6-50 MG tablet Take 2 tablets by mouth daily. Patient taking differently: Take 2 tablets by mouth daily as needed for constipation.  10/13/13   Teryl Lucy, MD   Meds Ordered and Administered this Visit  Medications - No data to display  BP 137/90 mmHg  Pulse 90  Temp(Src) 98.9 F (37.2 C) (Oral)  Resp 18  SpO2 98% No data found.   Physical Exam  Constitutional: She is oriented to person,  place, and time. She appears well-developed and well-nourished. No distress.  Cardiovascular: Normal rate, regular rhythm and normal heart sounds.   No murmur heard. Pulmonary/Chest: Effort normal and breath sounds normal. No respiratory distress. She has no wheezes. She has no rales. She exhibits tenderness (she is tender over the fifth and sixth ribs in the axillary line. Palpation of this area causes radiating pain to the left side.).  Neurological: She is alert and oriented to person, place, and time.    ED Course  Procedures (including critical care time)  Labs Review Labs Reviewed - No data to display  Imaging Review Dg Ribs Unilateral W/chest Right  08/10/2015  CLINICAL DATA:  Right-sided rib pain, 1 month duration. No known injury. EXAM: RIGHT RIBS AND CHEST - 3+ VIEW COMPARISON:  08/06/2015 and multiple previous FINDINGS: There are old healed fractures on the right affecting ribs number 5, 6 and 7. I do not see any acute right-sided rib fracture. Shoulder girdle region appears normal. The heart size is normal. Mediastinal shadows are normal. Lungs are clear. No pneumothorax or hemothorax. IMPRESSION: No active cardiopulmonary disease. Old rib fractures on the right affecting ribs 5 through 7. No acute rib injury seen. Electronically Signed   By: Paulina Fusi M.D.   On: 08/10/2015 17:31      MDM   1. Rib pain on right side    I provided a prescription for 15 tablets of her hydrocodone. Also refilled the Naprosyn she states this is beneficial. Follow-up with PCP as scheduled.   Charm Rings, MD 08/10/15 [phone]

## 2015-08-10 NOTE — ED Notes (Signed)
The patient presented to the [facility] with a complaint of right sided flank pain that has been ongoing for 1 month.

## 2015-09-09 ENCOUNTER — Encounter (HOSPITAL_COMMUNITY): Payer: Self-pay

## 2015-09-09 ENCOUNTER — Emergency Department (HOSPITAL_COMMUNITY)
Admission: EM | Admit: 2015-09-09 | Discharge: 2015-09-09 | Disposition: A | Discharge status: Home / Self Care | Payer: Medicaid Other | Attending: Emergency Medicine | Admitting: Emergency Medicine

## 2015-09-09 DIAGNOSIS — E669 Obesity, unspecified: Secondary | ICD-10-CM | POA: Insufficient documentation

## 2015-09-09 DIAGNOSIS — I10 Essential (primary) hypertension: Secondary | ICD-10-CM | POA: Diagnosis not present

## 2015-09-09 DIAGNOSIS — Z7984 Long term (current) use of oral hypoglycemic drugs: Secondary | ICD-10-CM | POA: Diagnosis not present

## 2015-09-09 DIAGNOSIS — B029 Zoster without complications: Secondary | ICD-10-CM | POA: Diagnosis present

## 2015-09-09 DIAGNOSIS — F329 Major depressive disorder, single episode, unspecified: Secondary | ICD-10-CM | POA: Insufficient documentation

## 2015-09-09 DIAGNOSIS — E1140 Type 2 diabetes mellitus with diabetic neuropathy, unspecified: Secondary | ICD-10-CM | POA: Diagnosis not present

## 2015-09-09 DIAGNOSIS — J449 Chronic obstructive pulmonary disease, unspecified: Secondary | ICD-10-CM | POA: Diagnosis not present

## 2015-09-09 DIAGNOSIS — F17210 Nicotine dependence, cigarettes, uncomplicated: Secondary | ICD-10-CM | POA: Diagnosis not present

## 2015-09-09 MED ORDER — ACYCLOVIR 800 MG PO TABS: 800.0000 mg | ORAL_TABLET | Freq: Four times a day (QID) | ORAL | Status: DC

## 2015-09-09 MED ORDER — HYDROCODONE-ACETAMINOPHEN 5-325 MG PO TABS: 1.0000 | ORAL_TABLET | Freq: Four times a day (QID) | ORAL | Status: DC | PRN

## 2015-09-09 NOTE — ED Notes (Signed)
Declined W/C at D/C and was escorted to lobby by RN. 

## 2015-09-09 NOTE — ED Provider Notes (Signed)
History  By signing my name below, I, Earmon Phoenix, attest that this documentation has been prepared under the direction and in the presence of Teressa Lower, FNP. Electronically Signed: Earmon Phoenix, ED Scribe. 09/09/2015. 2:27 PM.  Chief Complaint  Patient presents with  . Herpes Zoster   The history is provided by the patient and medical records. No language interpreter was used.    HPI Comments:  Vanessa [name] is a 60 y.o. obese female with PMHx of shingles who presents to the Emergency Department complaining of a blistery rash to the right buttocks that began a couple days ago. Pt reports moderate pain. She states this feels the same as previous shingles outbreaks. She has not done anything to treat the symptoms stating she is out of Acyclovir. Touching the area increases the pain. She denies alleviating factors. She denies fever, chills, nausea, vomiting. She denies ever receiving the shingles vaccination.  Past Medical History  Diagnosis Date  . Hypertension   . Heart murmur   . Asthma   . Emphysema   . Diabetes mellitus   . Hyperlipidemia   . Allergic rhinitis   . Chronic headache   . Neuropathy (HCC)   . Grave's disease   . Herniated disc   . COPD (chronic obstructive pulmonary disease) (HCC)   . Menopause   . Hernia, umbilical   . Anxiety   . Depression   . Back pain, chronic   . Shingles   . Herniated disc   . Arthritis     rheumatoid  . 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 Generalized anxiety disorder 05/26/2012  . Chronic back pain 05/27/2012    Secondary to L4-5 herniated disc per patient  . OSA (obstructive sleep apnea)     has a cpap  . Wears glasses   . Wears dentures     top  . Tear of lateral meniscus of right knee 10/13/2013  . Alcoholism (HCC)   . Sleep apnea     wear c-pap  . Allergy   . Emphysema of lung (HCC)   . GERD (gastroesophageal reflux disease)   .  Osteoporosis   . Rheumatoid arthritis [facility])    Past Surgical History  Procedure Laterality Date  . Cholecystectomy    . Tubal ligation    . Foot surgery      cyst rt foot  . Laparoscopy      age 68  . Eye surgery      eye back in socker-rt  . Knee arthroscopy with lateral menisectomy Right 10/13/2013    Procedure: RIGHT KNEE ARTHROSCOPY WITH PARTIAL LATERAL MENISECTOMY/DEBRIDEMENT/SHAVING ;  Surgeon: Eulas Post, MD;  Location: [facility];  Service: Orthopedics;  Laterality: Right;  . Left heart catheterization with coronary angiogram N/A 05/30/2012    Procedure: LEFT HEART CATHETERIZATION WITH CORONARY ANGIOGRAM;  Surgeon: [name], MD;  Location: [facility] CATH LAB;  Service: Cardiovascular;  Laterality: N/A;   Family History  Problem Relation Age of Onset  . Emphysema Mother   . Allergies Mother   . Asthma Mother   . Heart disease Mother   . Rheum arthritis Mother   . Diabetes Mother   . Kidney disease Mother   . Allergies Daughter   . Asthma Brother   . Breast cancer Other     aunt  . Lung cancer Brother   . Throat cancer Brother   . Colon cancer Neg  Hx   . Colon polyps Neg Hx   . Esophageal cancer Neg Hx   . Rectal cancer Neg Hx   . Stomach cancer Neg Hx    Social History  Substance Use Topics  . Smoking status: Current Every Day Smoker -- 1.00 packs/day for 40 years    Types: Cigarettes  . Smokeless tobacco: Never Used  . Alcohol Use: No   OB History    Gravida Para Term Preterm AB TAB SAB Ectopic Multiple Living   5 3   2  2   3      Review of Systems  Constitutional: Negative for fever and chills.  Gastrointestinal: Negative for nausea and vomiting.  Skin: Positive for rash.  All other systems reviewed and are negative.   Allergies  Ciprofloxacin; Fluocinolone; and Penicillins  Home Medications   Prior to Admission medications   Medication Sig Start Date End Date Taking? Authorizing Provider  acyclovir (ZOVIRAX) 400 MG tablet  Take 400 mg by mouth 2 (two) times daily.     Historical Provider, MD  albuterol (PROAIR HFA) 108 (90 BASE) MCG/ACT inhaler Inhale 2 puffs into the lungs 4 (four) times daily. For COPD    Historical Provider, MD  atorvastatin (LIPITOR) 40 MG tablet Take 1 tablet (40 mg total) by mouth daily. 10/06/12   12/07/12, DO  BuPROPion HCl (WELLBUTRIN PO) Take 1 tablet by mouth 2 (two) times daily.    Historical Provider, MD  busPIRone (BUSPAR) 10 MG tablet Take 20 mg by mouth 3 (three) times daily.     Historical Provider, MD  cyclobenzaprine (FLEXERIL) 10 MG tablet 1 tablet 3 times a day 04/14/15   04/16/15, PA  cycloSPORINE (RESTASIS) 0.05 % ophthalmic emulsion Place 1 drop into both eyes 2 (two) times daily.    Historical Provider, MD  diazepam (VALIUM) 5 MG tablet Take 1 tablet (5 mg total) by mouth 2 (two) times daily. 08/06/15   10/06/15, MD  diclofenac sodium (VOLTAREN) 1 % GEL Apply 2 g topically 4 (four) times daily as needed (pain).     Historical Provider, MD  FLUoxetine (PROZAC) 20 MG capsule Take 60 mg by mouth every morning.     Historical Provider, MD  glipiZIDE (GLUCOTROL) 10 MG tablet Take 10 mg by mouth 2 (two) times daily before a meal.    Historical Provider, MD  HYDROcodone-acetaminophen (NORCO) 10-325 MG tablet Take 1 tablet by mouth every 6 (six) hours as needed. 08/10/15   08/12/15, MD  HYDROcodone-homatropine (HYCODAN) 5-1.5 MG/5ML syrup Take 5 mLs by mouth every 6 (six) hours as needed for cough. 08/03/15   10/03/15, PA-C  ipratropium-albuterol (DUONEB) 0.5-2.5 (3) MG/3ML SOLN Take 3 mLs by nebulization every 6 (six) hours as needed (Asthma).    Historical Provider, MD  levothyroxine (SYNTHROID, LEVOTHROID) 175 MCG tablet Take 175 mcg by mouth every morning.     Historical Provider, MD  lidocaine (LIDODERM) 5 % Place 1 patch onto the skin daily. Remove & Discard patch within 12 hours or as directed by MD 08/03/15   10/03/15, PA-C  lisinopril  (PRINIVIL,ZESTRIL) 10 MG tablet Take 10 mg by mouth daily.    Historical Provider, MD  loratadine (CLARITIN) 10 MG tablet Take 10 mg by mouth daily as needed for allergies.    Historical Provider, MD  meloxicam (MOBIC) 15 MG tablet Take 15 mg by mouth daily.    Historical Provider, MD  metFORMIN (GLUCOPHAGE) 1000 MG tablet  Take 1,000 mg by mouth 2 (two) times daily.     Historical Provider, MD  methocarbamol (ROBAXIN) 500 MG tablet Take 1 tablet (500 mg total) by mouth 2 (two) times daily. 12/07/14   Fayrene Helper, PA-C  naproxen (NAPROSYN) 500 MG tablet Take 1 tablet (500 mg total) by mouth 2 (two) times daily. 08/10/15   Charm Rings, MD  Olopatadine HCl (PATADAY) 0.2 % SOLN Apply 1 drop to eye daily. 12/21/14   Charm Rings, MD  predniSONE (STERAPRED UNI-PAK 48 TAB) 5 MG (48) TBPK tablet Take 1 tablet (5 mg total) by mouth daily. 08/03/15   Riki Sheer, PA-C  pregabalin (LYRICA) 75 MG capsule Take 75 mg by mouth 3 (three) times daily.    Historical Provider, MD  QUEtiapine (SEROQUEL) 25 MG tablet Take 50 mg by mouth at bedtime.     Historical Provider, MD  sennosides-docusate sodium (SENOKOT-S) 8.6-50 MG tablet Take 2 tablets by mouth daily. Patient taking differently: Take 2 tablets by mouth daily as needed for constipation.  10/13/13   Teryl Lucy, MD  tiotropium (SPIRIVA) 18 MCG inhalation capsule Place 1 capsule (18 mcg total) into inhaler and inhale daily. 12/18/14   Valentino Nose, MD  traMADol (ULTRAM) 50 MG tablet Take 50 mg by mouth every 6 (six) hours as needed for moderate pain.  02/05/14   Historical Provider, MD  verapamil (CALAN) 40 MG tablet Take 40 mg by mouth 3 (three) times daily.    Historical Provider, MD   Triage Vitals: BP 119/78 mmHg  Pulse 69  Temp(Src) 98.1 F (36.7 C) (Oral)  Resp 20  Ht 5\' 9"  (1.753 m)  Wt 220 lb (99.791 kg)  BMI 32.47 kg/m2  SpO2 100% Physical Exam  Constitutional: She is oriented to person, place, and time. She appears well-developed and  well-nourished.  HENT:  Head: Normocephalic and atraumatic.  Eyes: EOM are normal.  Neck: Normal range of motion.  Cardiovascular: Normal rate.   Pulmonary/Chest: Effort normal.  Musculoskeletal: Normal range of motion.  Neurological: She is alert and oriented to person, place, and time.  Skin:  Red patch of vesicular rash to the right buttock  Psychiatric: She has a normal mood and affect. Her behavior is normal.  Nursing note and vitals reviewed.   ED Course  Procedures (including critical care time) DIAGNOSTIC STUDIES: Oxygen Saturation is 100% on RA, normal by my interpretation.   COORDINATION OF CARE: 2:27 PM- Will refill Acyclovir. Pt verbalizes understanding and agrees to plan.  Medications - No data to display   MDM   Final diagnoses:  Shingles    Will given acyclovir and hydrocodone. Pt to be seen by pcp in five days  I personally performed the services described in this documentation, which was scribed in my presence. The recorded information has been reviewed and is accurate.     Teressa Lower, NP 09/09/15 1434  Mancel Bale, MD 09/09/15 1646

## 2015-09-09 NOTE — Discharge Instructions (Signed)

## 2015-09-09 NOTE — ED Notes (Signed)
Patient presents with a blister above the sacrum and states that she can feel another shingles outbreak coming on.

## 2015-11-05 ENCOUNTER — Other Ambulatory Visit: Payer: Self-pay

## 2015-11-05 DIAGNOSIS — Z5321 Procedure and treatment not carried out due to patient leaving prior to being seen by health care provider: Secondary | ICD-10-CM | POA: Diagnosis not present

## 2015-11-05 DIAGNOSIS — E1140 Type 2 diabetes mellitus with diabetic neuropathy, unspecified: Secondary | ICD-10-CM | POA: Diagnosis not present

## 2015-11-05 DIAGNOSIS — J449 Chronic obstructive pulmonary disease, unspecified: Secondary | ICD-10-CM | POA: Insufficient documentation

## 2015-11-05 DIAGNOSIS — R079 Chest pain, unspecified: Secondary | ICD-10-CM | POA: Diagnosis present

## 2015-11-05 DIAGNOSIS — F17210 Nicotine dependence, cigarettes, uncomplicated: Secondary | ICD-10-CM | POA: Insufficient documentation

## 2015-11-06 ENCOUNTER — Other Ambulatory Visit: Payer: Self-pay

## 2015-11-06 ENCOUNTER — Emergency Department (HOSPITAL_COMMUNITY)
Admission: EM | Admit: 2015-11-06 | Discharge: 2015-11-06 | Disposition: A | Discharge status: Home / Self Care | Payer: Medicaid Other | Attending: Emergency Medicine | Admitting: Emergency Medicine

## 2015-11-06 ENCOUNTER — Emergency Department (HOSPITAL_COMMUNITY): Payer: Medicaid Other

## 2015-11-06 DIAGNOSIS — R0602 Shortness of breath: Secondary | ICD-10-CM

## 2015-11-06 DIAGNOSIS — Z5321 Procedure and treatment not carried out due to patient leaving prior to being seen by health care provider: Secondary | ICD-10-CM

## 2015-11-06 LAB — COMPREHENSIVE METABOLIC PANEL
ALBUMIN: 3.8 g/dL (ref 3.5–5.0)
ALT: 14 U/L (ref 14–54)
ANION GAP: 9 (ref 5–15)
AST: 19 U/L (ref 15–41)
Alkaline Phosphatase: 46 U/L (ref 38–126)
BILIRUBIN TOTAL: 0.2 mg/dL — AB (ref 0.3–1.2)
BUN: 5 mg/dL — ABNORMAL LOW (ref 6–20)
CALCIUM: 8.9 mg/dL (ref 8.9–10.3)
CO2: 27 mmol/L (ref 22–32)
Chloride: 103 mmol/L (ref 101–111)
Creatinine, Ser: 1.18 mg/dL — ABNORMAL HIGH (ref 0.44–1.00)
GFR, EST AFRICAN AMERICAN: 58 mL/min — AB (ref >60)
GFR, EST NON AFRICAN AMERICAN: 50 mL/min — AB (ref >60)
GLUCOSE: 117 mg/dL — AB (ref 65–99)
POTASSIUM: 3.2 mmol/L — AB (ref 3.5–5.1)
Sodium: 139 mmol/L (ref 135–145)
TOTAL PROTEIN: 5.7 g/dL — AB (ref 6.5–8.1)

## 2015-11-06 LAB — TROPONIN I

## 2015-11-06 LAB — CBC
HEMATOCRIT: 37 % (ref 36.0–46.0)
Hemoglobin: 11.9 g/dL — ABNORMAL LOW (ref 12.0–15.0)
MCH: 28.3 pg (ref 26.0–34.0)
MCHC: 32.2 g/dL (ref 30.0–36.0)
MCV: 87.9 fL (ref 78.0–100.0)
Platelets: 204 10*3/uL (ref 150–400)
RBC: 4.21 MIL/uL (ref 3.87–5.11)
RDW: 13.3 % (ref 11.5–15.5)
WBC: 9.9 10*3/uL (ref 4.0–10.5)

## 2015-11-08 NOTE — ED Provider Notes (Signed)
Pt left without being seen after triage.    Alvira Monday, MD 11/08/15 [phone]

## 2015-11-18 ENCOUNTER — Telehealth: Payer: Self-pay | Admitting: Pulmonary Disease

## 2015-11-18 ENCOUNTER — Ambulatory Visit: Payer: Medicaid Other | Admitting: Pulmonary Disease

## 2015-11-18 DIAGNOSIS — G4733 Obstructive sleep apnea (adult) (pediatric): Secondary | ICD-10-CM

## 2015-11-18 NOTE — Telephone Encounter (Signed)
VS please advise if ok to send in order for her to change DME companies.  She is needing a new mask and supplies.  Pt did no show for her 915 appt this am and did not reschedule.  thanks

## 2015-11-18 NOTE — Telephone Encounter (Signed)
Called and spoke with pt and she stated that she wants to change DME companies---from [employer] to someone close to [address].  Order has been placed for ASAP so pt can get her supplies.

## 2015-11-18 NOTE — Telephone Encounter (Signed)
Okay to send order to change DMEs.  Please make sure she gets scheduled ROV.

## 2015-12-07 ENCOUNTER — Encounter (HOSPITAL_COMMUNITY): Payer: Self-pay | Admitting: *Deleted

## 2015-12-07 DIAGNOSIS — E039 Hypothyroidism, unspecified: Secondary | ICD-10-CM | POA: Insufficient documentation

## 2015-12-07 DIAGNOSIS — I10 Essential (primary) hypertension: Secondary | ICD-10-CM | POA: Insufficient documentation

## 2015-12-07 DIAGNOSIS — R21 Rash and other nonspecific skin eruption: Secondary | ICD-10-CM | POA: Insufficient documentation

## 2015-12-07 DIAGNOSIS — E1140 Type 2 diabetes mellitus with diabetic neuropathy, unspecified: Secondary | ICD-10-CM | POA: Insufficient documentation

## 2015-12-07 DIAGNOSIS — Z7984 Long term (current) use of oral hypoglycemic drugs: Secondary | ICD-10-CM | POA: Diagnosis not present

## 2015-12-07 DIAGNOSIS — J449 Chronic obstructive pulmonary disease, unspecified: Secondary | ICD-10-CM | POA: Diagnosis not present

## 2015-12-07 DIAGNOSIS — F17210 Nicotine dependence, cigarettes, uncomplicated: Secondary | ICD-10-CM | POA: Diagnosis not present

## 2015-12-07 NOTE — ED Triage Notes (Signed)
The pt is c/o a rash over her entire body  Under her rt arm since last pm    It itches

## 2015-12-08 ENCOUNTER — Emergency Department (HOSPITAL_COMMUNITY)
Admission: EM | Admit: 2015-12-08 | Discharge: 2015-12-08 | Disposition: A | Discharge status: Home / Self Care | Payer: Medicaid Other | Attending: Emergency Medicine | Admitting: Emergency Medicine

## 2015-12-08 DIAGNOSIS — R21 Rash and other nonspecific skin eruption: Secondary | ICD-10-CM

## 2015-12-08 NOTE — ED Provider Notes (Signed)
[facility] Provider Note   CSN: [id number] Arrival date & time: 12/07/15  2333  By signing my name below, I, Alyssa Grove, attest that this documentation has been prepared under the direction and in the presence of Zadie Rhine, MD. Electronically Signed: Alyssa Grove, ED Scribe. 12/08/15. 2:22 AM.   History   Chief Complaint Chief Complaint  Patient presents with  . Rash   The history is provided by the patient. No language interpreter was used.  Rash   This is a new problem. The problem has not changed since onset.The problem is associated with an unknown factor. Affected Location: right under arm. The pain is mild. The pain has been constant since onset. Associated symptoms include itching and pain. Treatments tried: lidocaine patch. The treatment provided no relief.    HPI Comments: PMHx of Shingles. Pt's states her grandchildren were seen by PCP for a rash and vomiting recently. She states normally when she starts experiencing shingles, it usually begins at the top of her buttocks. She states she takes 2 pills everyday for the last 3 years for her Shingles. Denies shortness of breath, fever, facial swelling, and vomiting.   She states she has chronic pain for herniated disc and rheumatoid arthritis.   Past Medical History:  Diagnosis Date  . Alcoholism (HCC)   . Allergic rhinitis   . Allergy   . Anxiety   . Arthritis    rheumatoid  . Asthma   . Back pain, chronic   . Chronic back pain 05/27/2012   Secondary to L4-5 herniated disc per patient  . Chronic headache   . COPD (chronic obstructive pulmonary disease) (HCC)   . Depression   . Diabetes mellitus   . Emphysema   . Emphysema of lung (HCC)   . Generalized anxiety disorder 05/26/2012  . GERD (gastroesophageal reflux disease)   . Grave's disease   . 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 Heart murmur   . Hernia, umbilical   . Herniated disc     . Herniated disc   . Hyperlipidemia   . Hypertension   . Menopause   . Neuropathy (HCC)   . OSA (obstructive sleep apnea)    has a cpap  . Osteoporosis   . Rheumatoid arthritis (HCC)   . Shingles   . Sleep apnea    wear c-pap  . Tear of lateral meniscus of right knee 10/13/2013  . Wears dentures    top  . Wears glasses     Patient Active Problem List   Diagnosis Date Noted  . Atypical pneumonia 12/18/2014  . Essential hypertension 12/17/2014  . COPD exacerbation (HCC) 12/16/2014  . Tear of lateral meniscus of right knee 10/13/2013  . Chest pain 07/23/2013  . GERD (gastroesophageal reflux disease) 07/23/2013  . Morbid obesity (HCC) 07/23/2013  . Renal failure (ARF), acute on chronic (HCC) 07/23/2013  . HLD (hyperlipidemia) 07/23/2013  . Dysarthria 10/05/2012  . Diarrhea 05/27/2012  . Chronic back pain 05/27/2012  . Diabetes mellitus (HCC) 05/26/2012  . Hypothyroidism 05/26/2012  . 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 Depression 05/26/2012  . Generalized anxiety disorder 05/26/2012  . OSA (obstructive sleep apnea) 03/18/2011    Past Surgical History:  Procedure Laterality Date  . CHOLECYSTECTOMY    . EYE SURGERY     eye back in socker-rt  . FOOT SURGERY     cyst rt foot  . KNEE ARTHROSCOPY WITH LATERAL MENISECTOMY  Right 10/13/2013   Procedure: RIGHT KNEE ARTHROSCOPY WITH PARTIAL LATERAL MENISECTOMY/DEBRIDEMENT/SHAVING ;  Surgeon: Eulas Post, MD;  Location: [facility];  Service: Orthopedics;  Laterality: Right;  . LAPAROSCOPY     age 21  . LEFT HEART CATHETERIZATION WITH CORONARY ANGIOGRAM N/A 05/30/2012   Procedure: LEFT HEART CATHETERIZATION WITH CORONARY ANGIOGRAM;  Surgeon: [name], MD;  Location: [facility] CATH LAB;  Service: Cardiovascular;  Laterality: N/A;  . TUBAL LIGATION      OB History    Gravida Para Term Preterm AB Living   5 3     2 3    SAB TAB Ectopic Multiple Live Births   2               Home Medications     Prior to Admission medications   Medication Sig Start Date End Date Taking? Authorizing Provider  acyclovir (ZOVIRAX) 400 MG tablet Take 400 mg by mouth 2 (two) times daily.     Historical Provider, MD  acyclovir (ZOVIRAX) 800 MG tablet Take 1 tablet (800 mg total) by mouth 4 (four) times daily. 09/09/15   11/09/15, NP  albuterol (PROAIR HFA) 108 (90 BASE) MCG/ACT inhaler Inhale 2 puffs into the lungs 4 (four) times daily. For COPD    Historical Provider, MD  atorvastatin (LIPITOR) 40 MG tablet Take 1 tablet (40 mg total) by mouth daily. 10/06/12   12/07/12, DO  BuPROPion HCl (WELLBUTRIN PO) Take 1 tablet by mouth 2 (two) times daily.    Historical Provider, MD  busPIRone (BUSPAR) 10 MG tablet Take 20 mg by mouth 3 (three) times daily.     Historical Provider, MD  cyclobenzaprine (FLEXERIL) 10 MG tablet 1 tablet 3 times a day 04/14/15   04/16/15, PA  cycloSPORINE (RESTASIS) 0.05 % ophthalmic emulsion Place 1 drop into both eyes 2 (two) times daily.    Historical Provider, MD  diazepam (VALIUM) 5 MG tablet Take 1 tablet (5 mg total) by mouth 2 (two) times daily. 08/06/15   10/06/15, MD  diclofenac sodium (VOLTAREN) 1 % GEL Apply 2 g topically 4 (four) times daily as needed (pain).     Historical Provider, MD  FLUoxetine (PROZAC) 20 MG capsule Take 60 mg by mouth every morning.     Historical Provider, MD  glipiZIDE (GLUCOTROL) 10 MG tablet Take 10 mg by mouth 2 (two) times daily before a meal.    Historical Provider, MD  HYDROcodone-acetaminophen (NORCO) 10-325 MG tablet Take 1 tablet by mouth every 6 (six) hours as needed. 08/10/15   08/12/15, MD  HYDROcodone-acetaminophen (NORCO/VICODIN) 5-325 MG tablet Take 1-2 tablets by mouth every 6 (six) hours as needed. 09/09/15   11/09/15, NP  HYDROcodone-homatropine (HYCODAN) 5-1.5 MG/5ML syrup Take 5 mLs by mouth every 6 (six) hours as needed for cough. 08/03/15   10/03/15, PA-C  ipratropium-albuterol (DUONEB)  0.5-2.5 (3) MG/3ML SOLN Take 3 mLs by nebulization every 6 (six) hours as needed (Asthma).    Historical Provider, MD  levothyroxine (SYNTHROID, LEVOTHROID) 175 MCG tablet Take 175 mcg by mouth every morning.     Historical Provider, MD  lidocaine (LIDODERM) 5 % Place 1 patch onto the skin daily. Remove & Discard patch within 12 hours or as directed by MD 08/03/15   10/03/15, PA-C  lisinopril (PRINIVIL,ZESTRIL) 10 MG tablet Take 10 mg by mouth daily.    Historical Provider, MD  loratadine (CLARITIN) 10 MG tablet Take  10 mg by mouth daily as needed for allergies.    Historical Provider, MD  meloxicam (MOBIC) 15 MG tablet Take 15 mg by mouth daily.    Historical Provider, MD  metFORMIN (GLUCOPHAGE) 1000 MG tablet Take 1,000 mg by mouth 2 (two) times daily.     Historical Provider, MD  methocarbamol (ROBAXIN) 500 MG tablet Take 1 tablet (500 mg total) by mouth 2 (two) times daily. 12/07/14   Fayrene Helper, PA-C  naproxen (NAPROSYN) 500 MG tablet Take 1 tablet (500 mg total) by mouth 2 (two) times daily. 08/10/15   Charm Rings, MD  Olopatadine HCl (PATADAY) 0.2 % SOLN Apply 1 drop to eye daily. 12/21/14   Charm Rings, MD  predniSONE (STERAPRED UNI-PAK 48 TAB) 5 MG (48) TBPK tablet Take 1 tablet (5 mg total) by mouth daily. 08/03/15   Riki Sheer, PA-C  pregabalin (LYRICA) 75 MG capsule Take 75 mg by mouth 3 (three) times daily.    Historical Provider, MD  QUEtiapine (SEROQUEL) 25 MG tablet Take 50 mg by mouth at bedtime.     Historical Provider, MD  sennosides-docusate sodium (SENOKOT-S) 8.6-50 MG tablet Take 2 tablets by mouth daily. Patient taking differently: Take 2 tablets by mouth daily as needed for constipation.  10/13/13   Teryl Lucy, MD  tiotropium (SPIRIVA) 18 MCG inhalation capsule Place 1 capsule (18 mcg total) into inhaler and inhale daily. 12/18/14   Valentino Nose, MD  traMADol (ULTRAM) 50 MG tablet Take 50 mg by mouth every 6 (six) hours as needed for moderate pain.  02/05/14    Historical Provider, MD  verapamil (CALAN) 40 MG tablet Take 40 mg by mouth 3 (three) times daily.    Historical Provider, MD    Family History Family History  Problem Relation Age of Onset  . Emphysema Mother   . Allergies Mother   . Asthma Mother   . Heart disease Mother   . Rheum arthritis Mother   . Diabetes Mother   . Kidney disease Mother   . Allergies Daughter   . Asthma Brother   . Breast cancer Other     aunt  . Lung cancer Brother   . Throat cancer Brother   . Colon cancer Neg Hx   . Colon polyps Neg Hx   . Esophageal cancer Neg Hx   . Rectal cancer Neg Hx   . Stomach cancer Neg Hx     Social History Social History  Substance Use Topics  . Smoking status: Current Every Day Smoker    Packs/day: 1.00    Years: 40.00    Types: Cigarettes  . Smokeless tobacco: Never Used  . Alcohol use No     Allergies   Ciprofloxacin; Fluocinolone; and Penicillins   Review of Systems Review of Systems  Constitutional: Negative for fever.  HENT: Negative for facial swelling.   Respiratory: Negative for shortness of breath.   Gastrointestinal: Negative for vomiting.  Musculoskeletal: Positive for back pain (Lower).  Skin: Positive for itching and rash.  All other systems reviewed and are negative.   Physical Exam Updated Vital Signs BP 105/64   Pulse 84   Temp 98.3 F (36.8 C)   Resp 18   Ht 5\' 7"  (1.702 m)   Wt 221 lb (100.2 kg)   SpO2 97%   BMI 34.61 kg/m   Physical Exam CONSTITUTIONAL: Well developed/well nourished HEAD: Normocephalic/atraumatic ENMT: Mucous membranes moist, no angioedema NECK: supple no meningeal signs CV: S1/S2 noted, no murmurs/rubs/gallops  noted LUNGS: Lungs are clear to auscultation bilaterally, no apparent distress ABDOMEN: soft, nontender NEURO: Pt is awake/alert/appropriate, moves all extremitiesx4.  No facial droop.   EXTREMITIES: pulses normal/equal, full ROM SKIN: warm, color normal, no rash noted.  No induration.  No  evidence of zoster PSYCH: no abnormalities of mood noted, alert and oriented to situation  ED Treatments / Results  DIAGNOSTIC STUDIES: Oxygen Saturation is 97% on RA, adequate by my interpretation.    Labs (all labs ordered are listed, but only abnormal results are displayed) Labs Reviewed - No data to display  EKG  EKG Interpretation None      Radiology No results found.  Procedures Procedures (including critical care time)  Medications Ordered in ED Medications - No data to display   Initial Impression / Assessment and Plan / ED Course  I have reviewed the triage vital signs and the nursing notes.    Clinical Course    Pt stable No significant rash noted No signs of allergic reaction At time of discharge she requested refill of all pain meds Advised ER is unable to refill chronic pain meds   Final Clinical Impressions(s) / ED Diagnoses   Final diagnoses:  Rash    New Prescriptions New Prescriptions   No medications on file   I personally performed the services described in this documentation, which was scribed in my presence. The recorded information has been reviewed and is accurate.        Zadie Rhine, MD 12/08/15 [phone]

## 2015-12-17 ENCOUNTER — Ambulatory Visit (HOSPITAL_COMMUNITY)
Admission: EM | Admit: 2015-12-17 | Discharge: 2015-12-17 | Disposition: A | Discharge status: Home / Self Care | Payer: Medicaid Other | Attending: Internal Medicine | Admitting: Internal Medicine

## 2015-12-17 ENCOUNTER — Encounter (HOSPITAL_COMMUNITY): Payer: Self-pay | Admitting: Emergency Medicine

## 2015-12-17 DIAGNOSIS — G8929 Other chronic pain: Secondary | ICD-10-CM | POA: Diagnosis not present

## 2015-12-17 DIAGNOSIS — Z760 Encounter for issue of repeat prescription: Secondary | ICD-10-CM

## 2015-12-17 MED ORDER — HYDROCODONE-ACETAMINOPHEN 5-325 MG PO TABS: 1.0000 | ORAL_TABLET | Freq: Four times a day (QID) | ORAL | 0 refills | Status: DC | PRN

## 2015-12-17 MED ORDER — PREGABALIN 75 MG PO CAPS: 75.0000 mg | ORAL_CAPSULE | Freq: Three times a day (TID) | ORAL | 1 refills | Status: DC

## 2015-12-17 NOTE — ED Notes (Signed)
Extensive discussion in regards to hard copy scripts and preprinted address on scripts

## 2015-12-17 NOTE — ED Triage Notes (Signed)
Patient has run out of lyrica for 7 days and is out of hydrocodone.    Patient reports her pcp has left a note on door he is no longer seeing medicaid patients.  Patient is trying to find a pcp.  Patient is on many medicines, but reports lyrica and pain medicine are the only medicines she is out of for now.

## 2015-12-17 NOTE — ED Provider Notes (Signed)
[facility]    CSN: [id number] Arrival date & time: 12/17/15  1705  First Provider Contact:  None       History   Chief Complaint Chief Complaint  Patient presents with  . Medication Refill    HPI Vanessa [name] is a 60 y.o. female.   Her provider is no longer accepting Medicaid patients.  She has a months supply of her other medications but no longer has meds for chronic pain syndrome. C/o neuropathy and musculoskeletal pain over her whole body      Past Medical History:  Diagnosis Date  . Alcoholism (HCC)   . Allergic rhinitis   . Allergy   . Anxiety   . Arthritis    rheumatoid  . Asthma   . Back pain, chronic   . Chronic back pain 05/27/2012   Secondary to L4-5 herniated disc per patient  . Chronic headache   . COPD (chronic obstructive pulmonary disease) (HCC)   . Depression   . Diabetes mellitus   . Emphysema   . Emphysema of lung (HCC)   . Generalized anxiety disorder 05/26/2012  . GERD (gastroesophageal reflux disease)   . Grave's disease   . 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 Heart murmur   . Hernia, umbilical   . Herniated disc   . Herniated disc   . Hyperlipidemia   . Hypertension   . Menopause   . Neuropathy (HCC)   . OSA (obstructive sleep apnea)    has a cpap  . Osteoporosis   . Rheumatoid arthritis (HCC)   . Shingles   . Sleep apnea    wear c-pap  . Tear of lateral meniscus of right knee 10/13/2013  . Wears dentures    top  . Wears glasses     Patient Active Problem List   Diagnosis Date Noted  . Atypical pneumonia 12/18/2014  . Essential hypertension 12/17/2014  . COPD exacerbation (HCC) 12/16/2014  . Tear of lateral meniscus of right knee 10/13/2013  . Chest pain 07/23/2013  . GERD (gastroesophageal reflux disease) 07/23/2013  . Morbid obesity (HCC) 07/23/2013  . Renal failure (ARF), acute on chronic (HCC) 07/23/2013  . HLD (hyperlipidemia)  07/23/2013  . Dysarthria 10/05/2012  . Diarrhea 05/27/2012  . Chronic back pain 05/27/2012  . Diabetes mellitus (HCC) 05/26/2012  . Hypothyroidism 05/26/2012  . 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 Depression 05/26/2012  . Generalized anxiety disorder 05/26/2012  . OSA (obstructive sleep apnea) 03/18/2011    Past Surgical History:  Procedure Laterality Date  . CHOLECYSTECTOMY    . EYE SURGERY     eye back in socker-rt  . FOOT SURGERY     cyst rt foot  . KNEE ARTHROSCOPY WITH LATERAL MENISECTOMY Right 10/13/2013   Procedure: RIGHT KNEE ARTHROSCOPY WITH PARTIAL LATERAL MENISECTOMY/DEBRIDEMENT/SHAVING ;  Surgeon: Eulas Post, MD;  Location: [facility];  Service: Orthopedics;  Laterality: Right;  . LAPAROSCOPY     age 25  . LEFT HEART CATHETERIZATION WITH CORONARY ANGIOGRAM N/A 05/30/2012   Procedure: LEFT HEART CATHETERIZATION WITH CORONARY ANGIOGRAM;  Surgeon: [name], MD;  Location: [facility] CATH LAB;  Service: Cardiovascular;  Laterality: N/A;  . TUBAL LIGATION      OB History    Gravida Para Term Preterm AB Living   5 3     2 3    SAB TAB Ectopic Multiple Live Births   2  Home Medications    Prior to Admission medications   Medication Sig Start Date End Date Taking? Authorizing Provider  acyclovir (ZOVIRAX) 400 MG tablet Take 400 mg by mouth 2 (two) times daily.     Historical Provider, MD  acyclovir (ZOVIRAX) 800 MG tablet Take 1 tablet (800 mg total) by mouth 4 (four) times daily. 09/09/15   Teressa Lower, NP  albuterol (PROAIR HFA) 108 (90 BASE) MCG/ACT inhaler Inhale 2 puffs into the lungs 4 (four) times daily. For COPD    Historical Provider, MD  atorvastatin (LIPITOR) 40 MG tablet Take 1 tablet (40 mg total) by mouth daily. 10/06/12   Gust Rung, DO  BuPROPion HCl (WELLBUTRIN PO) Take 1 tablet by mouth 2 (two) times daily.    Historical Provider, MD  busPIRone (BUSPAR) 10 MG tablet Take 20 mg by mouth 3 (three) times  daily.     Historical Provider, MD  cyclobenzaprine (FLEXERIL) 10 MG tablet 1 tablet 3 times a day 04/14/15   Tharon Aquas, PA  cycloSPORINE (RESTASIS) 0.05 % ophthalmic emulsion Place 1 drop into both eyes 2 (two) times daily.    Historical Provider, MD  diazepam (VALIUM) 5 MG tablet Take 1 tablet (5 mg total) by mouth 2 (two) times daily. 08/06/15   Derwood Kaplan, MD  diclofenac sodium (VOLTAREN) 1 % GEL Apply 2 g topically 4 (four) times daily as needed (pain).     Historical Provider, MD  FLUoxetine (PROZAC) 20 MG capsule Take 60 mg by mouth every morning.     Historical Provider, MD  glipiZIDE (GLUCOTROL) 10 MG tablet Take 10 mg by mouth 2 (two) times daily before a meal.    Historical Provider, MD  HYDROcodone-acetaminophen (NORCO) 10-325 MG tablet Take 1 tablet by mouth every 6 (six) hours as needed. 08/10/15   Charm Rings, MD  HYDROcodone-acetaminophen (NORCO/VICODIN) 5-325 MG tablet Take 1-2 tablets by mouth every 6 (six) hours as needed for severe pain. 12/17/15   Arnaldo Natal, MD  HYDROcodone-homatropine [facility]) 5-1.5 MG/5ML syrup Take 5 mLs by mouth every 6 (six) hours as needed for cough. 08/03/15   Riki Sheer, PA-C  ipratropium-albuterol (DUONEB) 0.5-2.5 (3) MG/3ML SOLN Take 3 mLs by nebulization every 6 (six) hours as needed (Asthma).    Historical Provider, MD  levothyroxine (SYNTHROID, LEVOTHROID) 175 MCG tablet Take 175 mcg by mouth every morning.     Historical Provider, MD  lidocaine (LIDODERM) 5 % Place 1 patch onto the skin daily. Remove & Discard patch within 12 hours or as directed by MD 08/03/15   Riki Sheer, PA-C  lisinopril (PRINIVIL,ZESTRIL) 10 MG tablet Take 10 mg by mouth daily.    Historical Provider, MD  loratadine (CLARITIN) 10 MG tablet Take 10 mg by mouth daily as needed for allergies.    Historical Provider, MD  meloxicam (MOBIC) 15 MG tablet Take 15 mg by mouth daily.    Historical Provider, MD  metFORMIN (GLUCOPHAGE) 1000 MG tablet Take 1,000 mg by  mouth 2 (two) times daily.     Historical Provider, MD  methocarbamol (ROBAXIN) 500 MG tablet Take 1 tablet (500 mg total) by mouth 2 (two) times daily. 12/07/14   Fayrene Helper, PA-C  naproxen (NAPROSYN) 500 MG tablet Take 1 tablet (500 mg total) by mouth 2 (two) times daily. 08/10/15   Charm Rings, MD  Olopatadine HCl (PATADAY) 0.2 % SOLN Apply 1 drop to eye daily. 12/21/14   Charm Rings, MD  predniSONE [facility]  UNI-PAK 48 TAB) 5 MG (48) TBPK tablet Take 1 tablet (5 mg total) by mouth daily. 08/03/15   Riki Sheer, PA-C  pregabalin (LYRICA) 75 MG capsule Take 1 capsule (75 mg total) by mouth 3 (three) times daily. 12/17/15   Arnaldo Natal, MD  QUEtiapine (SEROQUEL) 25 MG tablet Take 50 mg by mouth at bedtime.     Historical Provider, MD  sennosides-docusate sodium (SENOKOT-S) 8.6-50 MG tablet Take 2 tablets by mouth daily. Patient taking differently: Take 2 tablets by mouth daily as needed for constipation.  10/13/13   Teryl Lucy, MD  tiotropium (SPIRIVA) 18 MCG inhalation capsule Place 1 capsule (18 mcg total) into inhaler and inhale daily. 12/18/14   Valentino Nose, MD  traMADol (ULTRAM) 50 MG tablet Take 50 mg by mouth every 6 (six) hours as needed for moderate pain.  02/05/14   Historical Provider, MD  verapamil (CALAN) 40 MG tablet Take 40 mg by mouth 3 (three) times daily.    Historical Provider, MD    Family History Family History  Problem Relation Age of Onset  . Emphysema Mother   . Allergies Mother   . Asthma Mother   . Heart disease Mother   . Rheum arthritis Mother   . Diabetes Mother   . Kidney disease Mother   . Allergies Daughter   . Asthma Brother   . Breast cancer Other     aunt  . Lung cancer Brother   . Throat cancer Brother   . Colon cancer Neg Hx   . Colon polyps Neg Hx   . Esophageal cancer Neg Hx   . Rectal cancer Neg Hx   . Stomach cancer Neg Hx     Social History Social History  Substance Use Topics  . Smoking status: Current Every Day Smoker     Packs/day: 1.00    Years: 40.00    Types: Cigarettes  . Smokeless tobacco: Never Used  . Alcohol use No     Allergies   Ciprofloxacin; Fluocinolone; and Penicillins   Review of Systems Review of Systems  Constitutional: Negative for chills and fever.  HENT: Negative for sore throat and tinnitus.   Eyes: Negative for redness.  Respiratory: Negative for cough and shortness of breath.   Cardiovascular: Negative for chest pain and palpitations.  Gastrointestinal: Negative for abdominal pain, diarrhea, nausea and vomiting.  Genitourinary: Negative for dysuria, frequency and urgency.  Musculoskeletal: Negative for myalgias.  Skin: Negative for rash.       No lesions  Neurological: Negative for weakness.  Hematological: Does not bruise/bleed easily.  Psychiatric/Behavioral: Negative for suicidal ideas.     Physical Exam Triage Vital Signs ED Triage Vitals  Enc Vitals Group     BP 12/17/15 1852 (!) 106/43     Pulse Rate 12/17/15 1852 84     Resp 12/17/15 1852 16     Temp 12/17/15 1852 98.4 F (36.9 C)     Temp Source 12/17/15 1852 Oral     SpO2 12/17/15 1852 100 %     Weight --      Height --      Head Circumference --      Peak Flow --      Pain Score 12/17/15 1924 10     Pain Loc --      Pain Edu? --      Excl. in GC? --    No data found.   Updated Vital Signs BP (!) 106/43   Pulse  84   Temp 98.4 F (36.9 C) (Oral)   Resp 16   SpO2 100%   Visual Acuity Right Eye Distance:   Left Eye Distance:   Bilateral Distance:    Right Eye Near:   Left Eye Near:    Bilateral Near:     Physical Exam  Constitutional: She is oriented to person, place, and time. She appears well-developed and well-nourished. No distress.  HENT:  Head: Normocephalic and atraumatic.  Mouth/Throat: Oropharynx is clear and moist.  Eyes: Conjunctivae and EOM are normal. Pupils are equal, round, and reactive to light. No scleral icterus.  Neck: Normal range of motion. Neck supple. No  JVD present. No tracheal deviation present. No thyromegaly present.  Cardiovascular: Normal rate, regular rhythm and normal heart sounds.  Exam reveals no gallop and no friction rub.   No murmur heard. Pulmonary/Chest: Effort normal and breath sounds normal.  Abdominal: Soft. Bowel sounds are normal. She exhibits no distension. There is no tenderness.  Musculoskeletal: Normal range of motion. She exhibits no edema.  Lymphadenopathy:    She has no cervical adenopathy.  Neurological: She is alert and oriented to person, place, and time. No cranial nerve deficit.  Skin: Skin is warm and dry.  Psychiatric: She has a normal mood and affect. Her behavior is normal. Judgment and thought content normal.  Nursing note and vitals reviewed.    UC Treatments / Results  Labs (all labs ordered are listed, but only abnormal results are displayed) Labs Reviewed - No data to display  EKG  EKG Interpretation None       Radiology No results found.  Procedures Procedures (including critical care time)  Medications Ordered in UC Medications - No data to display   Initial Impression / Assessment and Plan / UC Course  I have reviewed the triage vital signs and the nursing notes.  Pertinent labs & imaging results that were available during my care of the patient were reviewed by me and considered in my medical decision making (see chart for details).  Clinical Course    Narcotics database confirms no narcs refilled since >30 days ago.  Will refill meds but informed patient that she will need to find a permanent primary care provider in order to re-establish her pain contract.    Final Clinical Impressions(s) / UC Diagnoses   Final diagnoses:  Medication refill  Chronic pain    New Prescriptions Current Discharge Medication List       Arnaldo Natal, MD 12/17/15 1946

## 2016-01-06 IMAGING — CT CT CHEST W/O CM
2 of 3 series · 15 of 36 positions shown, 18 images · IV contrast (Omnipaque 300)
Comparison: CT 12/06/2014

CLINICAL DATA: Follow-up lung nodules seen on CT of November 2013.
Recent pneumonia.

EXAM:
CT CHEST WITHOUT CONTRAST
TECHNIQUE: Multidetector CT imaging of the chest was performed following the
standard protocol without IV contrast.

[Series 2: chest routine with · axial · 0.70mm/px · z∈[-272,-32]mm · 12 of 58 slices shown, 15 images]
[im 5/58  mediastinal]
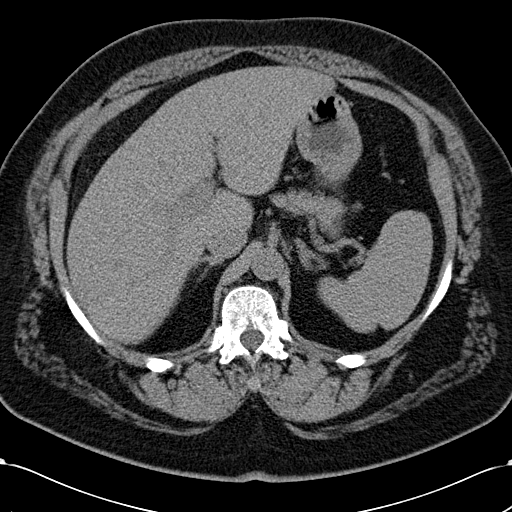
[im 5/58  lung]
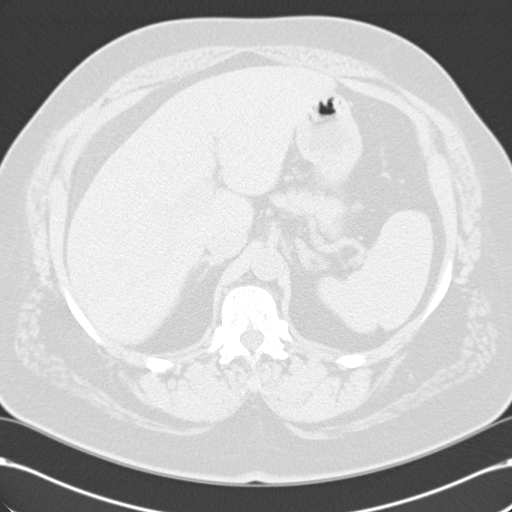
[im 9/58  lung]
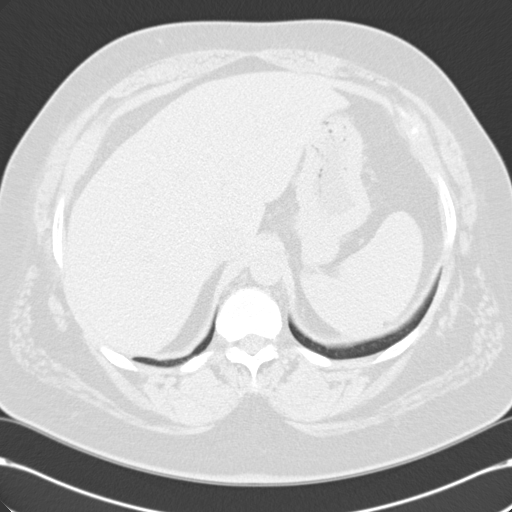
[im 13/58  lung]
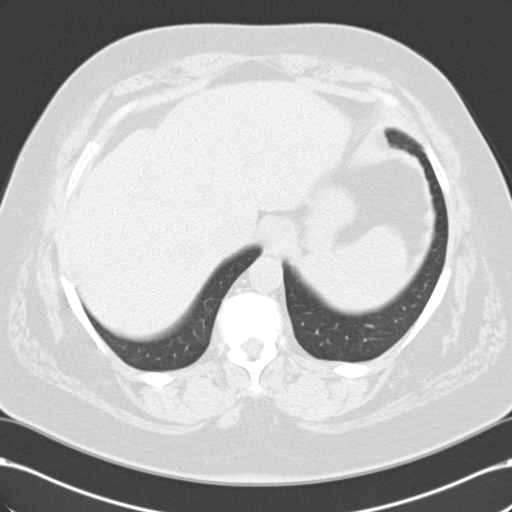
[im 17/58  lung]
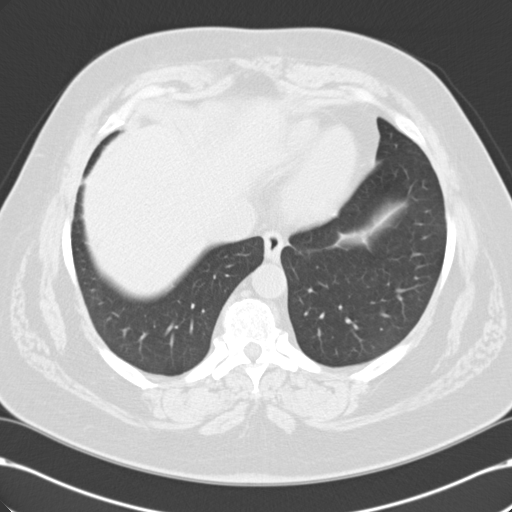
[im 22/58  mediastinal]
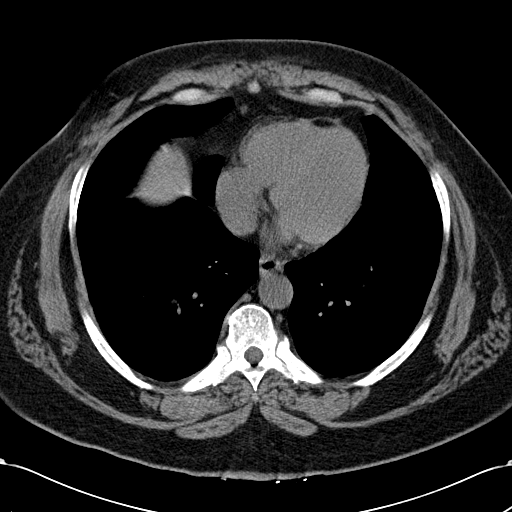
[im 22/58  lung]
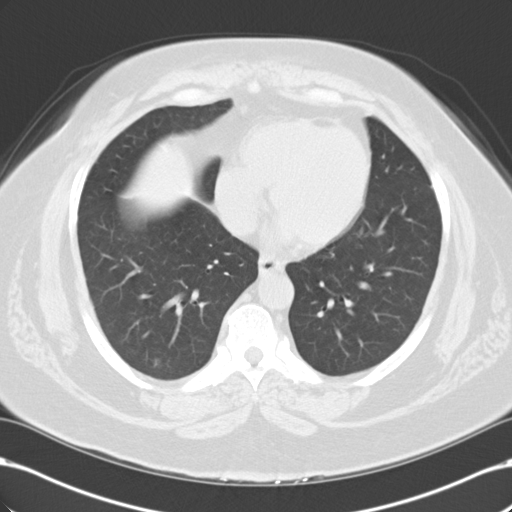
[im 26/58  lung]
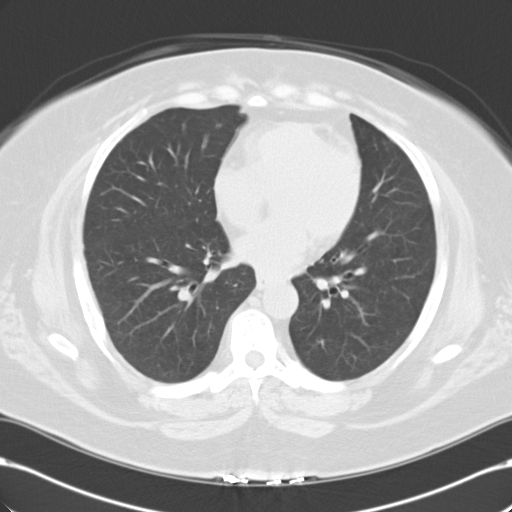
[im 32/58  lung]
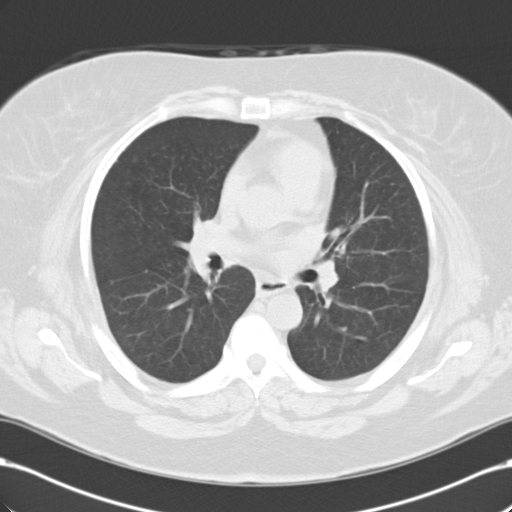
[im 36/58  lung]
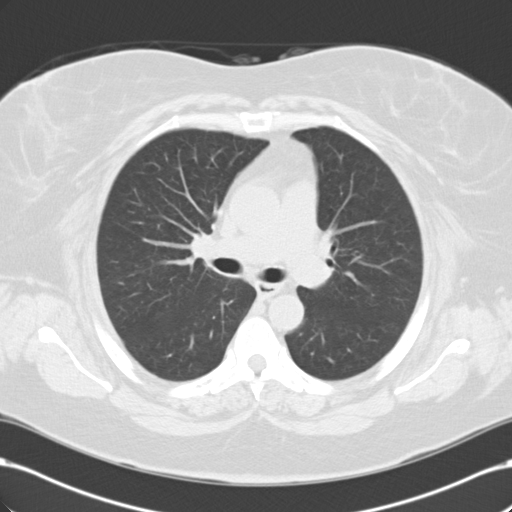
[im 41/58  mediastinal]
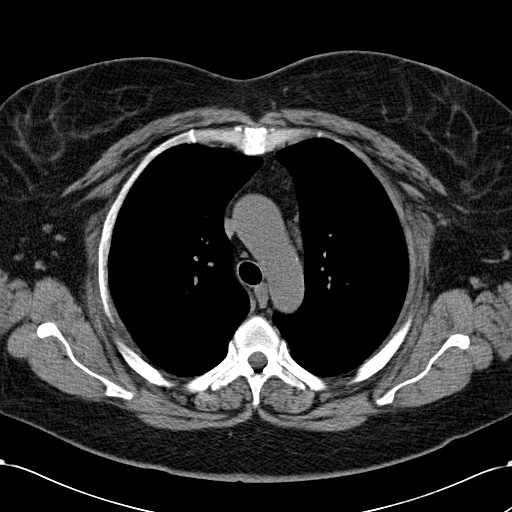
[im 41/58  lung]
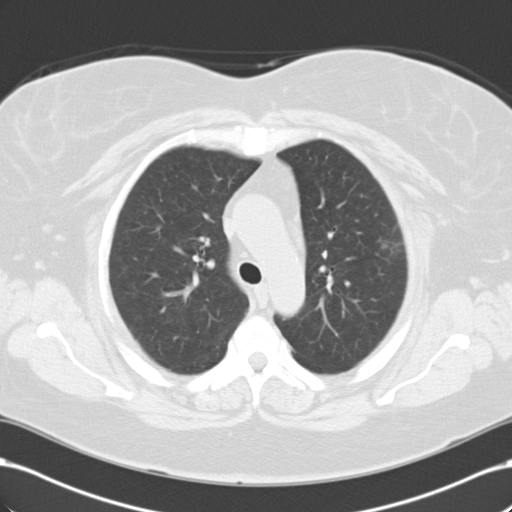
[im 45/58  lung]
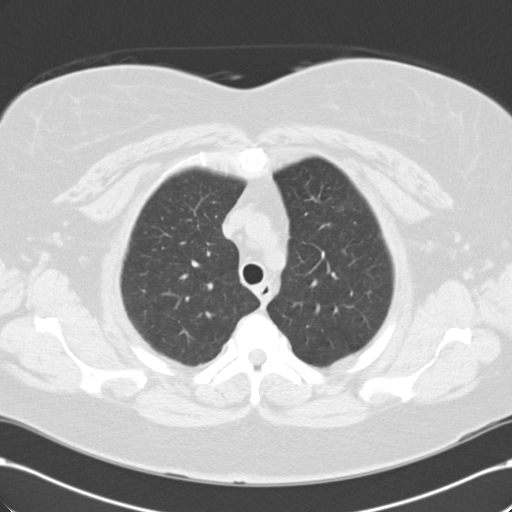
[im 49/58  lung]
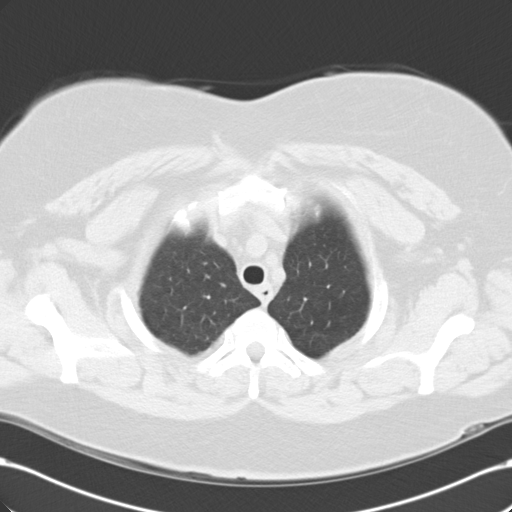
[im 53/58  lung]
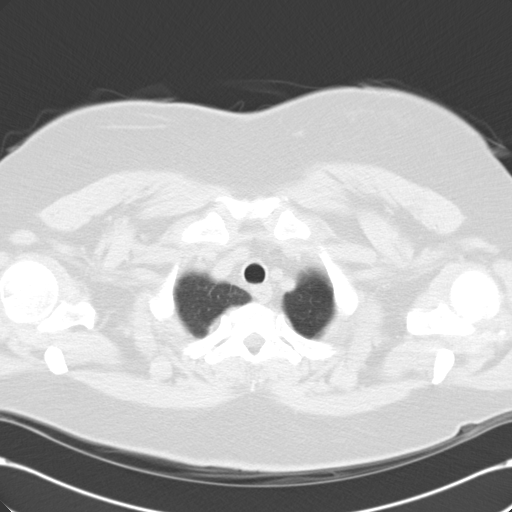

[Series 602: cor · coronal · 0.70mm/px · 3 of 121 slices shown]
[im 25/121  lung]
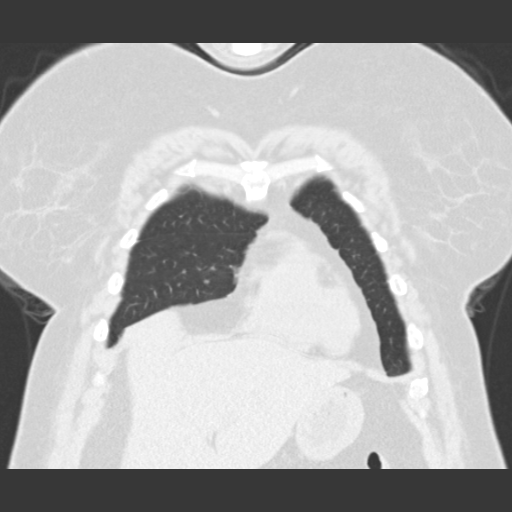
[im 49/121  lung]
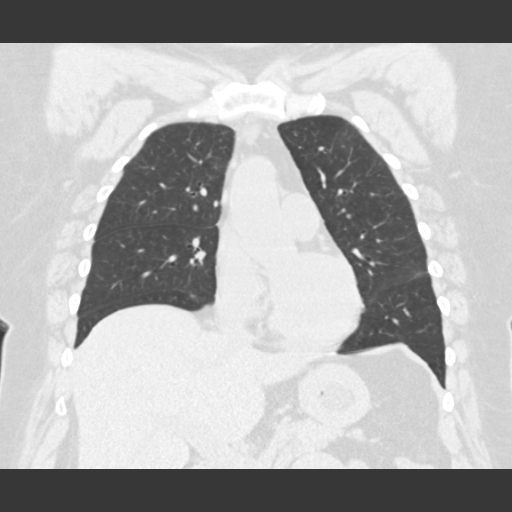
[im 73/121  lung]
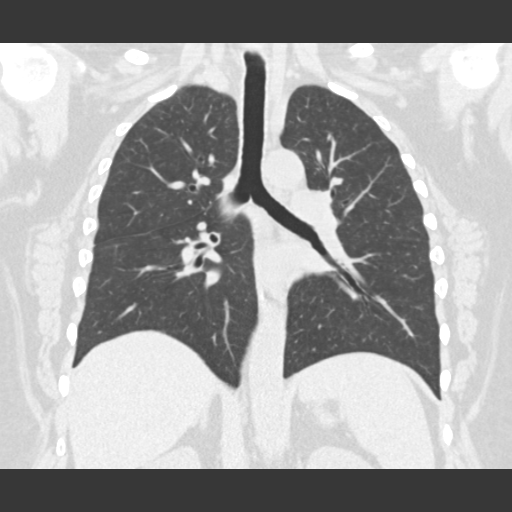

[15 of 36 positions shown; findings below may reference images not displayed]

FINDINGS: Mediastinum/Nodes: No axillary supraclavicular adenopathy. No
mediastinal hilar adenopathy. No pericardial fluid. Esophagus is
normal.

Lungs/Pleura: Ground-glass opacity in the RIGHT upper lobe is
decreased in density (image 18, series 3) consistent with a
resolving infectious or inflammatory process. The peripheral
nodularity in the RIGHT lower lobe has completely resolved. Small
RIGHT upper lobe nodule also resolved. No new nodularity.

Upper abdomen: Limited view of the liver, kidneys, pancreas are
unremarkable. Normal adrenal glands.

Musculoskeletal: No aggressive osseous lesion.
IMPRESSION: 1. Near complete resolution of the ground-glass nodularity within
the LEFT upper lobe and peripheral nodularity in the RIGHT lower
lobe is consistent with an improving infectious or inflammatory
process.
2. No new pulmonary nodularity.  No mediastinal adenopathy

## 2016-01-08 ENCOUNTER — Encounter (HOSPITAL_COMMUNITY): Payer: Self-pay | Admitting: Emergency Medicine

## 2016-01-08 ENCOUNTER — Ambulatory Visit (HOSPITAL_COMMUNITY)
Admission: EM | Admit: 2016-01-08 | Discharge: 2016-01-08 | Disposition: A | Discharge status: Home / Self Care | Payer: Medicaid Other | Attending: Family Medicine | Admitting: Family Medicine

## 2016-01-08 DIAGNOSIS — G8929 Other chronic pain: Secondary | ICD-10-CM

## 2016-01-08 DIAGNOSIS — M5441 Lumbago with sciatica, right side: Secondary | ICD-10-CM

## 2016-01-08 DIAGNOSIS — J441 Chronic obstructive pulmonary disease with (acute) exacerbation: Secondary | ICD-10-CM | POA: Diagnosis not present

## 2016-01-08 MED ORDER — GABAPENTIN 300 MG PO CAPS: 300.0000 mg | ORAL_CAPSULE | Freq: Three times a day (TID) | ORAL | 0 refills | Status: DC

## 2016-01-08 MED ORDER — METHYLPREDNISOLONE 4 MG PO TBPK: ORAL_TABLET | ORAL | 0 refills | Status: DC

## 2016-01-08 MED ORDER — AZITHROMYCIN 250 MG PO TABS: 250.0000 mg | ORAL_TABLET | Freq: Every day | ORAL | 0 refills | Status: DC

## 2016-01-08 MED ORDER — KETOROLAC TROMETHAMINE 60 MG/2ML IM SOLN
60.0000 mg | Freq: Once | INTRAMUSCULAR | Status: AC
Start: 2016-01-08 — End: 2016-01-08
  Administered 2016-01-08: 60 mg via INTRAMUSCULAR

## 2016-01-08 MED ORDER — KETOROLAC TROMETHAMINE 60 MG/2ML IM SOLN
INTRAMUSCULAR | Status: AC
  Filled 2016-01-08: qty 2

## 2016-01-08 NOTE — ED Triage Notes (Signed)
Patient has multiple complaints of edema and pain.  Reports duration of symptoms for over 2 months.  Generalized pain and swelling

## 2016-01-08 NOTE — ED Provider Notes (Signed)
CSN: [id number]     Arrival date & time 01/08/16  1202 History   First MD Initiated Contact with Patient 01/08/16 1405     Chief Complaint  Patient presents with  . Edema   (Consider location/radiation/quality/duration/timing/severity/associated sxs/prior Treatment) Patient is here with c/o chronic back pain.  She states she has not been able to get PCP and needs pain medicine for her back pain.  She states she has copd and her copd is exacerbating.  She states she used to be on gabapentin but this was changed by her doctor to lyrica.  She had a rx written recently and it was over 400 dollars and she could not afford it.  She is getting over shingles and is using lidoderm patches which she states is helping  Curb her shingles pain.  She has been seeing a pain specialist in [facility].  She states he changed her from gabapentin to lyrica.  She states she has had a change with her insurance and is now on medicaid and this is why she no longer sees pain management or her PCP.  She states she is still seeking a PCP.    Back Pain  Location:  Lumbar spine Quality:  Aching Radiates to:  R foot Pain severity:  Moderate Pain is:  Same all the time Onset quality:  Gradual Duration:  2 months Timing:  Constant Progression:  Waxing and waning Chronicity:  Chronic Context: recent illness   Relieved by:  Narcotics Worsened by:  Ambulation Ineffective treatments:  OTC medications Associated symptoms comment:  COPD   Past Medical History:  Diagnosis Date  . Alcoholism (HCC)   . Allergic rhinitis   . Allergy   . Anxiety   . Arthritis    rheumatoid  . Asthma   . Back pain, chronic   . Chronic back pain 05/27/2012   Secondary to L4-5 herniated disc per patient  . Chronic headache   . COPD (chronic obstructive pulmonary disease) (HCC)   . Depression   . Diabetes mellitus   . Emphysema   . Emphysema of lung (HCC)   . Generalized anxiety disorder 05/26/2012  . GERD (gastroesophageal  reflux disease)   . Grave's disease   . 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 Heart murmur   . Hernia, umbilical   . Herniated disc   . Herniated disc   . Hyperlipidemia   . Hypertension   . Menopause   . Neuropathy (HCC)   . OSA (obstructive sleep apnea)    has a cpap  . Osteoporosis   . Rheumatoid arthritis (HCC)   . Shingles   . Sleep apnea    wear c-pap  . Tear of lateral meniscus of right knee 10/13/2013  . Wears dentures    top  . Wears glasses    Past Surgical History:  Procedure Laterality Date  . CHOLECYSTECTOMY    . EYE SURGERY     eye back in socker-rt  . FOOT SURGERY     cyst rt foot  . KNEE ARTHROSCOPY WITH LATERAL MENISECTOMY Right 10/13/2013   Procedure: RIGHT KNEE ARTHROSCOPY WITH PARTIAL LATERAL MENISECTOMY/DEBRIDEMENT/SHAVING ;  Surgeon: Eulas Post, MD;  Location: [facility];  Service: Orthopedics;  Laterality: Right;  . LAPAROSCOPY     age 71  . LEFT HEART CATHETERIZATION WITH CORONARY ANGIOGRAM N/A 05/30/2012   Procedure: LEFT HEART CATHETERIZATION WITH CORONARY ANGIOGRAM;  Surgeon: [name],  MD;  Location: [facility];  Service: Cardiovascular;  Laterality: N/A;  . TUBAL LIGATION     Family History  Problem Relation Age of Onset  . Emphysema Mother   . Allergies Mother   . Asthma Mother   . Heart disease Mother   . Rheum arthritis Mother   . Diabetes Mother   . Kidney disease Mother   . Allergies Daughter   . Asthma Brother   . Breast cancer Other     aunt  . Lung cancer Brother   . Throat cancer Brother   . Colon cancer Neg Hx   . Colon polyps Neg Hx   . Esophageal cancer Neg Hx   . Rectal cancer Neg Hx   . Stomach cancer Neg Hx    Social History  Substance Use Topics  . Smoking status: Current Every Day Smoker    Packs/day: 1.00    Years: 40.00    Types: Cigarettes  . Smokeless tobacco: Never Used  . Alcohol use No   OB History    Gravida  Para Term Preterm AB Living   5 3     2 3    SAB TAB Ectopic Multiple Live Births   2             Review of Systems  Constitutional: Negative.   HENT: Negative.   Eyes: Negative.   Respiratory: Positive for shortness of breath.   Cardiovascular: Negative.   Gastrointestinal: Negative.   Endocrine: Negative.   Genitourinary: Negative.   Musculoskeletal: Positive for back pain.  Skin: Negative.   Allergic/Immunologic: Negative.   Neurological: Negative.   Hematological: Negative.   Psychiatric/Behavioral: Negative.     Allergies  Ciprofloxacin; Fluocinolone; and Penicillins  Home Medications   Prior to Admission medications   Medication Sig Start Date End Date Taking? Authorizing Provider  acyclovir (ZOVIRAX) 400 MG tablet Take 400 mg by mouth 2 (two) times daily.     Historical Provider, MD  acyclovir (ZOVIRAX) 800 MG tablet Take 1 tablet (800 mg total) by mouth 4 (four) times daily. 09/09/15   11/09/15, NP  albuterol (PROAIR HFA) 108 (90 BASE) MCG/ACT inhaler Inhale 2 puffs into the lungs 4 (four) times daily. For COPD    Historical Provider, MD  atorvastatin (LIPITOR) 40 MG tablet Take 1 tablet (40 mg total) by mouth daily. 10/06/12   12/07/12, DO  BuPROPion HCl (WELLBUTRIN PO) Take 1 tablet by mouth 2 (two) times daily.    Historical Provider, MD  busPIRone (BUSPAR) 10 MG tablet Take 20 mg by mouth 3 (three) times daily.     Historical Provider, MD  cyclobenzaprine (FLEXERIL) 10 MG tablet 1 tablet 3 times a day 04/14/15   04/16/15, PA  cycloSPORINE (RESTASIS) 0.05 % ophthalmic emulsion Place 1 drop into both eyes 2 (two) times daily.    Historical Provider, MD  diazepam (VALIUM) 5 MG tablet Take 1 tablet (5 mg total) by mouth 2 (two) times daily. 08/06/15   10/06/15, MD  diclofenac sodium (VOLTAREN) 1 % GEL Apply 2 g topically 4 (four) times daily as needed (pain).     Historical Provider, MD  FLUoxetine (PROZAC) 20 MG capsule Take 60 mg by mouth every  morning.     Historical Provider, MD  glipiZIDE (GLUCOTROL) 10 MG tablet Take 10 mg by mouth 2 (two) times daily before a meal.    Historical Provider, MD  HYDROcodone-acetaminophen (NORCO) 10-325 MG tablet Take 1 tablet by mouth  every 6 (six) hours as needed. 08/10/15   Charm Rings, MD  HYDROcodone-acetaminophen (NORCO/VICODIN) 5-325 MG tablet Take 1-2 tablets by mouth every 6 (six) hours as needed for severe pain. 12/17/15   Arnaldo Natal, MD  HYDROcodone-homatropine [facility]) 5-1.5 MG/5ML syrup Take 5 mLs by mouth every 6 (six) hours as needed for cough. 08/03/15   Riki Sheer, PA-C  ipratropium-albuterol (DUONEB) 0.5-2.5 (3) MG/3ML SOLN Take 3 mLs by nebulization every 6 (six) hours as needed (Asthma).    Historical Provider, MD  levothyroxine (SYNTHROID, LEVOTHROID) 175 MCG tablet Take 175 mcg by mouth every morning.     Historical Provider, MD  lidocaine (LIDODERM) 5 % Place 1 patch onto the skin daily. Remove & Discard patch within 12 hours or as directed by MD 08/03/15   Riki Sheer, PA-C  lisinopril (PRINIVIL,ZESTRIL) 10 MG tablet Take 10 mg by mouth daily.    Historical Provider, MD  loratadine (CLARITIN) 10 MG tablet Take 10 mg by mouth daily as needed for allergies.    Historical Provider, MD  meloxicam (MOBIC) 15 MG tablet Take 15 mg by mouth daily.    Historical Provider, MD  metFORMIN (GLUCOPHAGE) 1000 MG tablet Take 1,000 mg by mouth 2 (two) times daily.     Historical Provider, MD  methocarbamol (ROBAXIN) 500 MG tablet Take 1 tablet (500 mg total) by mouth 2 (two) times daily. 12/07/14   Fayrene Helper, PA-C  naproxen (NAPROSYN) 500 MG tablet Take 1 tablet (500 mg total) by mouth 2 (two) times daily. 08/10/15   Charm Rings, MD  Olopatadine HCl (PATADAY) 0.2 % SOLN Apply 1 drop to eye daily. 12/21/14   Charm Rings, MD  predniSONE (STERAPRED UNI-PAK 48 TAB) 5 MG (48) TBPK tablet Take 1 tablet (5 mg total) by mouth daily. 08/03/15   Riki Sheer, PA-C  pregabalin (LYRICA) 75 MG  capsule Take 1 capsule (75 mg total) by mouth 3 (three) times daily. 12/17/15   Arnaldo Natal, MD  QUEtiapine (SEROQUEL) 25 MG tablet Take 50 mg by mouth at bedtime.     Historical Provider, MD  sennosides-docusate sodium (SENOKOT-S) 8.6-50 MG tablet Take 2 tablets by mouth daily. Patient taking differently: Take 2 tablets by mouth daily as needed for constipation.  10/13/13   Teryl Lucy, MD  tiotropium (SPIRIVA) 18 MCG inhalation capsule Place 1 capsule (18 mcg total) into inhaler and inhale daily. 12/18/14   Valentino Nose, MD  traMADol (ULTRAM) 50 MG tablet Take 50 mg by mouth every 6 (six) hours as needed for moderate pain.  02/05/14   Historical Provider, MD  verapamil (CALAN) 40 MG tablet Take 40 mg by mouth 3 (three) times daily.    Historical Provider, MD   Meds Ordered and Administered this Visit  Medications - No data to display  There were no vitals taken for this visit. No data found.   Physical Exam  Constitutional: She is oriented to person, place, and time. She appears well-developed and well-nourished.  HENT:  Head: Normocephalic and atraumatic.  Eyes: Conjunctivae and EOM are normal. Pupils are equal, round, and reactive to light.  Cardiovascular: Normal rate, regular rhythm and normal heart sounds.   Pulmonary/Chest: Effort normal.  BBS diminished throughout A/P  Abdominal: Soft. Bowel sounds are normal.  Musculoskeletal: She exhibits tenderness.  TTP bilateral LS muscles.  Neurological: She is alert and oriented to person, place, and time. She has normal reflexes.  Nursing note and vitals reviewed.   Urgent  Care Course   Clinical Course    Procedures (including critical care time)  Labs Review Labs Reviewed - No data to display  Imaging Review No results found.   Visual Acuity Review  Right Eye Distance:   Left Eye Distance:   Bilateral Distance:    Right Eye Near:   Left Eye Near:    Bilateral Near:         MDM  Chronic Back Pain -  Explained she needs to get a PCP and gave her address and phone number of Dr. Wynelle Link at [address].  Recommend Toradol 60mg  IM  And medrol dose pack as directed 4mg  #21 and Gabapentin 300mg  po tid #90  Explained to patient at length that I would like to rx her gabapentin for her pain as alternative because she could not afford the lyrica she was rx'd when she was here last.  I explained I am not comfortable prescribing any hydrocodone and will give toradol shot for her pain today. Explained to get PCP Dr. Wynelle Link ASAP.  Advised her to continue her robaxin that she has at home.  Continue with lidoderm patches on LS spine.  Continue with meloxicam.  Copd Exacerbation - Zpack as directed and Medrol Dose Pack as directed #21 4mg .      Deatra Canter, FNP 01/08/16 1437    Deatra Canter, FNP 01/08/16 [phone]

## 2016-01-08 NOTE — ED Notes (Signed)
Patient was argumentative with this nurse.  Patient did not want gabapentin.  Reports she had been taken off this because it did not help.  Reports her pcp put a note on door, no longer seeing patients.  Patient says she has been looking for a pcp.  Patient is wanting lyrica and narcotic.  Patient repeatedly saying she has been with out medicine for a month and is in withdrawal.  Presentation does not reflect this.  Patient was calm on arrival.  Now patient is argumentative.  patient stood from wheelchair with out assistance and walked out using her cane.  Patient has a medicine bottle with her and made sure she had that with her prior to leaving.  Patient repeatedly speaks of suing [place] because she is on disability due to pain medicine dependency.  Patient keeps saying she was" offered knee surgery or pain medicines and she chose pain medicines.  This is why I am on disability"  Patient says [facility] refused to give her pain medicine.  Reminded patient we gave a pain shot and offered pain medicine.  This started the cycle of pain medicines that do not help.  Patient left with instructions and 3 hard copy scripts in her hand.  Patient is known to [employer] provider, suggested she contact that provider.  Patient rapidly talking, but not listening

## 2016-01-22 ENCOUNTER — Ambulatory Visit: Payer: Medicaid Other | Admitting: Pulmonary Disease

## 2016-02-02 ENCOUNTER — Encounter (HOSPITAL_COMMUNITY): Payer: Self-pay | Admitting: Family Medicine

## 2016-02-02 ENCOUNTER — Ambulatory Visit (HOSPITAL_COMMUNITY)
Admission: EM | Admit: 2016-02-02 | Discharge: 2016-02-02 | Disposition: A | Discharge status: Home / Self Care | Payer: Medicaid Other | Attending: Emergency Medicine | Admitting: Emergency Medicine

## 2016-02-02 DIAGNOSIS — G894 Chronic pain syndrome: Secondary | ICD-10-CM

## 2016-02-02 DIAGNOSIS — R0789 Other chest pain: Secondary | ICD-10-CM | POA: Diagnosis not present

## 2016-02-02 DIAGNOSIS — M940 Chondrocostal junction syndrome [Tietze]: Secondary | ICD-10-CM | POA: Diagnosis not present

## 2016-02-02 DIAGNOSIS — M778 Other enthesopathies, not elsewhere classified: Secondary | ICD-10-CM | POA: Diagnosis not present

## 2016-02-02 MED ORDER — KETOROLAC TROMETHAMINE 30 MG/ML IJ SOLN
30.0000 mg | Freq: Once | INTRAMUSCULAR | Status: AC
  Administered 2016-02-02: 30 mg via INTRAMUSCULAR

## 2016-02-02 MED ORDER — TRAMADOL HCL 50 MG PO TABS: 50.0000 mg | ORAL_TABLET | Freq: Four times a day (QID) | ORAL | 0 refills | Status: DC | PRN

## 2016-02-02 MED ORDER — PREDNISONE 20 MG PO TABS: ORAL_TABLET | ORAL | 0 refills | Status: DC

## 2016-02-02 MED ORDER — KETOROLAC TROMETHAMINE 30 MG/ML IJ SOLN
INTRAMUSCULAR | Status: AC
  Filled 2016-02-02: qty 1

## 2016-02-02 NOTE — ED Triage Notes (Signed)
Pt here for left arm pain radiating into face and left chest. sts the pain started in wrist months ago and now is moving. sts head pain. sts left face swelling. Left arm warm touch.

## 2016-02-02 NOTE — Discharge Instructions (Signed)
You have tendinitis of the left wrist. Wear the splint for the next couple weeks. May remove it periodically to move the wrist around and not stiff. Apply ice to the area of the wrist with the most pain and tenderness. Limit use of the left arm and wrist. We will also have inflammation in the ribs and muscles of your left chest wall. Apply ice to this area in the same medication for your wrist will also help with your chest. Do not forget to take deep rest frequently to keep your lungs well inflated. For additional medication refills, especially for chronic medication he will need to see a primary care provider. The urgent care we will not be able to provide those medications for you.

## 2016-02-02 NOTE — ED Provider Notes (Signed)
CSN: [id number]     Arrival date & time 02/02/16  1227 History   First MD Initiated Contact with Patient 02/02/16 1420     Chief Complaint  Patient presents with  . Chest Pain  . Facial Pain  . Headache   (Consider location/radiation/quality/duration/timing/severity/associated sxs/prior Treatment) 60 year old female with a history of chronic back pain as well as other areas of chronic pain presents to the urgent care complaining of left wrist pain for 6 weeks. She states the pain is worse with movement, lifting and grasping and tenderness along the back of the thumb and radial wrist. Denies any known injury, fall or trauma. She states the pain migrated to the left anterolateral chest whether some pain with taking a deep breath and movement. She is also complaining of her chronic pain and is requesting her medicines be refilled. She is requesting Norco, tramadol, Valium and other medications. She states her physician is no longer in the office and she has been searching for a physician for over a month. She has an appointment with [facility]. She states she has been going back and forth to emergency departments and urgent care in attempts to try and obtain her medications for pain.  She has a history of alcoholism, chronic back pain, anxiety, COPD, chronic headache, depression, type 2 diabetes mellitus, emphysema, herniated disc, osteoporosis, OSA, neuropathy.      Past Medical History:  Diagnosis Date  . Alcoholism (HCC)   . Allergic rhinitis   . Allergy   . Anxiety   . Arthritis    rheumatoid  . Asthma   . Back pain, chronic   . Chronic back pain 05/27/2012   Secondary to L4-5 herniated disc per patient  . Chronic headache   . COPD (chronic obstructive pulmonary disease) (HCC)   . Depression   . Diabetes mellitus   . Emphysema   . Emphysema of lung (HCC)   . Generalized anxiety disorder 05/26/2012  . GERD (gastroesophageal reflux disease)   . Grave's disease   .  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 Heart murmur   . Hernia, umbilical   . Herniated disc   . Herniated disc   . Hyperlipidemia   . Hypertension   . Menopause   . Neuropathy (HCC)   . OSA (obstructive sleep apnea)    has a cpap  . Osteoporosis   . Rheumatoid arthritis (HCC)   . Shingles   . Sleep apnea    wear c-pap  . Tear of lateral meniscus of right knee 10/13/2013  . Wears dentures    top  . Wears glasses    Past Surgical History:  Procedure Laterality Date  . CHOLECYSTECTOMY    . EYE SURGERY     eye back in socker-rt  . FOOT SURGERY     cyst rt foot  . KNEE ARTHROSCOPY WITH LATERAL MENISECTOMY Right 10/13/2013   Procedure: RIGHT KNEE ARTHROSCOPY WITH PARTIAL LATERAL MENISECTOMY/DEBRIDEMENT/SHAVING ;  Surgeon: Eulas Post, MD;  Location: [facility];  Service: Orthopedics;  Laterality: Right;  . LAPAROSCOPY     age 51  . LEFT HEART CATHETERIZATION WITH CORONARY ANGIOGRAM N/A 05/30/2012   Procedure: LEFT HEART CATHETERIZATION WITH CORONARY ANGIOGRAM;  Surgeon: [name], MD;  Location: [facility] CATH LAB;  Service: Cardiovascular;  Laterality: N/A;  . TUBAL LIGATION     Family History  Problem Relation Age of Onset  . Emphysema  Mother   . Allergies Mother   . Asthma Mother   . Heart disease Mother   . Rheum arthritis Mother   . Diabetes Mother   . Kidney disease Mother   . Allergies Daughter   . Asthma Brother   . Breast cancer Other     aunt  . Lung cancer Brother   . Throat cancer Brother   . Colon cancer Neg Hx   . Colon polyps Neg Hx   . Esophageal cancer Neg Hx   . Rectal cancer Neg Hx   . Stomach cancer Neg Hx    Social History  Substance Use Topics  . Smoking status: Current Every Day Smoker    Packs/day: 1.00    Years: 40.00    Types: Cigarettes  . Smokeless tobacco: Never Used  . Alcohol use No   OB History    Gravida Para Term Preterm AB Living   5 3     2  3    SAB TAB Ectopic Multiple Live Births   2             Review of Systems  Constitutional: Negative for activity change and fatigue.       Patient states she believes she has constant fevers because she continues to run the fireplace in the air conditioner at the same time. She appears to be feeling cold 1 minute and half the next. She has not actually measured her temperature. Currently afebrile.  HENT: Positive for facial swelling.   Eyes: Negative.   Respiratory:       Left anterolateral chest pain while taking deep breaths and performing specific movements with her arm and shoulder.  Cardiovascular: Negative for palpitations and leg swelling.  Gastrointestinal: Negative.   Musculoskeletal: Positive for back pain and myalgias. Negative for joint swelling.  Skin: Negative.   Neurological: Positive for headaches. Negative for seizures, syncope, facial asymmetry and speech difficulty.  Psychiatric/Behavioral: Positive for agitation and decreased concentration.  All other systems reviewed and are negative.   Allergies  Ciprofloxacin; Fluocinolone; and Penicillins  Home Medications   Prior to Admission medications   Medication Sig Start Date End Date Taking? Authorizing Provider  acyclovir (ZOVIRAX) 400 MG tablet Take 400 mg by mouth 2 (two) times daily.     Historical Provider, MD  acyclovir (ZOVIRAX) 800 MG tablet Take 1 tablet (800 mg total) by mouth 4 (four) times daily. 09/09/15   Teressa Lower, NP  albuterol (PROAIR HFA) 108 (90 BASE) MCG/ACT inhaler Inhale 2 puffs into the lungs 4 (four) times daily. For COPD    Historical Provider, MD  atorvastatin (LIPITOR) 40 MG tablet Take 1 tablet (40 mg total) by mouth daily. 10/06/12   Gust Rung, DO  azithromycin (ZITHROMAX) 250 MG tablet Take 1 tablet (250 mg total) by mouth daily. Take first 2 tablets together, then 1 every day until finished. 01/08/16   Deatra Canter, FNP  BuPROPion HCl (WELLBUTRIN PO) Take 1 tablet by mouth  2 (two) times daily.    Historical Provider, MD  busPIRone (BUSPAR) 10 MG tablet Take 20 mg by mouth 3 (three) times daily.     Historical Provider, MD  cyclobenzaprine (FLEXERIL) 10 MG tablet 1 tablet 3 times a day 04/14/15   Tharon Aquas, PA  cycloSPORINE (RESTASIS) 0.05 % ophthalmic emulsion Place 1 drop into both eyes 2 (two) times daily.    Historical Provider, MD  diazepam (VALIUM) 5 MG tablet Take 1 tablet (5 mg total) by  mouth 2 (two) times daily. 08/06/15   Derwood KaplanAnkit Nanavati, MD  diclofenac sodium (VOLTAREN) 1 % GEL Apply 2 g topically 4 (four) times daily as needed (pain).     Historical Provider, MD  FLUoxetine (PROZAC) 20 MG capsule Take 60 mg by mouth every morning.     Historical Provider, MD  gabapentin (NEURONTIN) 300 MG capsule Take 1 capsule (300 mg total) by mouth 3 (three) times daily. 01/08/16   Deatra CanterWilliam J Oxford, FNP  glipiZIDE (GLUCOTROL) 10 MG tablet Take 10 mg by mouth 2 (two) times daily before a meal.    Historical Provider, MD  HYDROcodone-acetaminophen (NORCO) 10-325 MG tablet Take 1 tablet by mouth every 6 (six) hours as needed. 08/10/15   Charm RingsErin J Honig, MD  HYDROcodone-acetaminophen (NORCO/VICODIN) 5-325 MG tablet Take 1-2 tablets by mouth every 6 (six) hours as needed for severe pain. 12/17/15   Arnaldo NatalMichael S Diamond, MD  HYDROcodone-homatropine [facility](HYCODAN) 5-1.5 MG/5ML syrup Take 5 mLs by mouth every 6 (six) hours as needed for cough. 08/03/15   Riki SheerMichelle G Young, PA-C  ipratropium-albuterol (DUONEB) 0.5-2.5 (3) MG/3ML SOLN Take 3 mLs by nebulization every 6 (six) hours as needed (Asthma).    Historical Provider, MD  levothyroxine (SYNTHROID, LEVOTHROID) 175 MCG tablet Take 175 mcg by mouth every morning.     Historical Provider, MD  lidocaine (LIDODERM) 5 % Place 1 patch onto the skin daily. Remove & Discard patch within 12 hours or as directed by MD 08/03/15   Riki SheerMichelle G Young, PA-C  lisinopril (PRINIVIL,ZESTRIL) 10 MG tablet Take 10 mg by mouth daily.    Historical Provider, MD   loratadine (CLARITIN) 10 MG tablet Take 10 mg by mouth daily as needed for allergies.    Historical Provider, MD  meloxicam (MOBIC) 15 MG tablet Take 15 mg by mouth daily.    Historical Provider, MD  metFORMIN (GLUCOPHAGE) 1000 MG tablet Take 1,000 mg by mouth 2 (two) times daily.     Historical Provider, MD  methocarbamol (ROBAXIN) 500 MG tablet Take 1 tablet (500 mg total) by mouth 2 (two) times daily. 12/07/14   Fayrene HelperBowie Tran, PA-C  methylPREDNISolone (MEDROL DOSEPAK) 4 MG TBPK tablet Take 6-5-4-3-2-1 po qd 01/08/16   Deatra CanterWilliam J Oxford, FNP  naproxen (NAPROSYN) 500 MG tablet Take 1 tablet (500 mg total) by mouth 2 (two) times daily. 08/10/15   Charm RingsErin J Honig, MD  Olopatadine HCl (PATADAY) 0.2 % SOLN Apply 1 drop to eye daily. 12/21/14   Charm RingsErin J Honig, MD  predniSONE (DELTASONE) 20 MG tablet Take 3 tabs po on first day, 2 tabs second day, 2 tabs third day, 1 tab fourth day, 1 tab 5th day. Take with food. 02/02/16   Hayden Rasmussenavid Kolton Kienle, NP  pregabalin (LYRICA) 75 MG capsule Take 1 capsule (75 mg total) by mouth 3 (three) times daily. 12/17/15   Arnaldo NatalMichael S Diamond, MD  QUEtiapine (SEROQUEL) 25 MG tablet Take 50 mg by mouth at bedtime.     Historical Provider, MD  sennosides-docusate sodium (SENOKOT-S) 8.6-50 MG tablet Take 2 tablets by mouth daily. Patient taking differently: Take 2 tablets by mouth daily as needed for constipation.  10/13/13   Teryl LucyJoshua Landau, MD  tiotropium (SPIRIVA) 18 MCG inhalation capsule Place 1 capsule (18 mcg total) into inhaler and inhale daily. 12/18/14   Valentino NoseNathan Boswell, MD  traMADol (ULTRAM) 50 MG tablet Take 1 tablet (50 mg total) by mouth every 6 (six) hours as needed. 02/02/16   Hayden Rasmussenavid Arrietty Dercole, NP  verapamil (CALAN) 40 MG tablet  Take 40 mg by mouth 3 (three) times daily.    Historical Provider, MD   Meds Ordered and Administered this Visit   Medications  ketorolac (TORADOL) 30 MG/ML injection 30 mg (not administered)    BP 128/92   Pulse 83   Temp 98.8 F (37.1 C)   Resp 16   SpO2 98%   No data found.   Physical Exam  Constitutional: She is oriented to person, place, and time. She appears well-developed and well-nourished. No distress.  Obese 60 year old female very talkative. She moves all extremities briskly and at will without apparent limitation. She turns her head back and forth briskly shaking her head left or right briskly, demonstrates full range of motion of the neck and shoulders. Speech is clear and rapid. No dysarthria.  HENT:  Head: Normocephalic and atraumatic.  Nose: Nose normal.  Mouth/Throat: Oropharynx is clear and moist. No oropharyngeal exudate.  Eyes: EOM are normal. Pupils are equal, round, and reactive to light.  Neck: Normal range of motion. Neck supple.  Cardiovascular: Normal rate, regular rhythm, normal heart sounds and intact distal pulses.   Pulmonary/Chest: Effort normal and breath sounds normal. No respiratory distress. She has no wheezes. She has no rales. She exhibits tenderness.  Unequivocal, reproducible left anterolateral chest wall tenderness. Pain is also elicited by arm and shoulder movements.  Musculoskeletal: Normal range of motion. She exhibits no deformity.  Left wrist without apparent edema, deformity, swelling, abnormal masses. She is able to flex and extend the wrist. There is tenderness to the radial aspect of the wrist. Positive Finkelstein sign. Distal neurovascular motor sensory is intact. Radial pulse 2+. Do not appreciate the swelling that she claims to have, this is mostly subjective.  Lymphadenopathy:    She has no cervical adenopathy.  Neurological: She is alert and oriented to person, place, and time. No cranial nerve deficit.  Skin: Skin is warm and dry.  Psychiatric: Her mood appears anxious. Her affect is blunt. Her speech is rapid and/or pressured. Her speech is not slurred. She is agitated. She expresses impulsivity.  Nursing note and vitals reviewed.   Urgent Care Course   Clinical Course    ED ECG  REPORT   Date: 02/02/2016  Rate: 78  Rhythm: normal sinus rhythm  QRS Axis:  Intervals: leftward  ST/T Wave abnormalities: nonspecific ST changes  Conduction Disutrbances:none  Narrative Interpretation:   Old EKG Reviewed: unchanged  I have personally reviewed the EKG tracing and agree with the computerized printout as noted.  Procedures (including critical care time)  Labs Review Labs Reviewed - No data to display  Imaging Review No results found.   Visual Acuity Review  Right Eye Distance:   Left Eye Distance:   Bilateral Distance:    Right Eye Near:   Left Eye Near:    Bilateral Near:         MDM   1. Chest wall pain   2. Costochondritis   3. Left wrist tendinitis   4. Chronic pain syndrome    You have tendinitis of the left wrist. Wear the splint for the next couple weeks. May remove it periodically to move the wrist around and not stiff. Apply ice to the area of the wrist with the most pain and tenderness. Limit use of the left arm and wrist. We will also have inflammation in the ribs and muscles of your left chest wall. Apply ice to this area in the same medication for your wrist will also help with  your chest. Do not forget to take deep rest frequently to keep your lungs well inflated. For additional medication refills, especially for chronic medication he will need to see a primary care provider. The urgent care we will not be able to provide those medications for you. Meds ordered this encounter  Medications  . ketorolac (TORADOL) 30 MG/ML injection 30 mg  . predniSONE (DELTASONE) 20 MG tablet    Sig: Take 3 tabs po on first day, 2 tabs second day, 2 tabs third day, 1 tab fourth day, 1 tab 5th day. Take with food.    Dispense:  9 tablet    Refill:  0    Order Specific Question:   Supervising Provider    Answer:   Charm Rings [id number]  . traMADol (ULTRAM) 50 MG tablet    Sig: Take 1 tablet (50 mg total) by mouth every 6 (six) hours as needed.     Dispense:  15 tablet    Refill:  0    Order Specific Question:   Supervising Provider    Answer:   Micheline Chapman       Hayden Rasmussen, NP 02/02/16 1510

## 2016-03-04 ENCOUNTER — Emergency Department (HOSPITAL_COMMUNITY)
Admission: EM | Admit: 2016-03-04 | Discharge: 2016-03-04 | Disposition: A | Discharge status: Home / Self Care | Payer: Medicaid Other | Attending: Emergency Medicine | Admitting: Emergency Medicine

## 2016-03-04 ENCOUNTER — Encounter (HOSPITAL_COMMUNITY): Payer: Self-pay | Admitting: *Deleted

## 2016-03-04 DIAGNOSIS — F329 Major depressive disorder, single episode, unspecified: Secondary | ICD-10-CM | POA: Insufficient documentation

## 2016-03-04 DIAGNOSIS — I10 Essential (primary) hypertension: Secondary | ICD-10-CM | POA: Insufficient documentation

## 2016-03-04 DIAGNOSIS — E1140 Type 2 diabetes mellitus with diabetic neuropathy, unspecified: Secondary | ICD-10-CM | POA: Insufficient documentation

## 2016-03-04 DIAGNOSIS — Z79899 Other long term (current) drug therapy: Secondary | ICD-10-CM | POA: Diagnosis not present

## 2016-03-04 DIAGNOSIS — M545 Low back pain: Secondary | ICD-10-CM | POA: Insufficient documentation

## 2016-03-04 DIAGNOSIS — G8929 Other chronic pain: Secondary | ICD-10-CM

## 2016-03-04 DIAGNOSIS — F17210 Nicotine dependence, cigarettes, uncomplicated: Secondary | ICD-10-CM | POA: Insufficient documentation

## 2016-03-04 DIAGNOSIS — J449 Chronic obstructive pulmonary disease, unspecified: Secondary | ICD-10-CM | POA: Diagnosis not present

## 2016-03-04 DIAGNOSIS — F32A Depression, unspecified: Secondary | ICD-10-CM

## 2016-03-04 DIAGNOSIS — E039 Hypothyroidism, unspecified: Secondary | ICD-10-CM | POA: Insufficient documentation

## 2016-03-04 DIAGNOSIS — Z7984 Long term (current) use of oral hypoglycemic drugs: Secondary | ICD-10-CM | POA: Diagnosis not present

## 2016-03-04 DIAGNOSIS — M549 Dorsalgia, unspecified: Secondary | ICD-10-CM | POA: Diagnosis present

## 2016-03-04 LAB — COMPREHENSIVE METABOLIC PANEL
ALT: 17 U/L (ref 14–54)
AST: 18 U/L (ref 15–41)
Albumin: 4.2 g/dL (ref 3.5–5.0)
Alkaline Phosphatase: 52 U/L (ref 38–126)
Anion gap: 8 (ref 5–15)
BUN: 13 mg/dL (ref 6–20)
CHLORIDE: 103 mmol/L (ref 101–111)
CO2: 29 mmol/L (ref 22–32)
CREATININE: 1.18 mg/dL — AB (ref 0.44–1.00)
Calcium: 9.3 mg/dL (ref 8.9–10.3)
GFR, EST AFRICAN AMERICAN: 58 mL/min — AB (ref >60)
GFR, EST NON AFRICAN AMERICAN: 50 mL/min — AB (ref >60)
Glucose, Bld: 140 mg/dL — ABNORMAL HIGH (ref 65–99)
POTASSIUM: 3.6 mmol/L (ref 3.5–5.1)
SODIUM: 140 mmol/L (ref 135–145)
Total Bilirubin: 0.5 mg/dL (ref 0.3–1.2)
Total Protein: 6.6 g/dL (ref 6.5–8.1)

## 2016-03-04 LAB — RAPID URINE DRUG SCREEN, HOSP PERFORMED
AMPHETAMINES: NOT DETECTED
BARBITURATES: NOT DETECTED
BENZODIAZEPINES: NOT DETECTED
COCAINE: NOT DETECTED
Opiates: POSITIVE — AB
TETRAHYDROCANNABINOL: POSITIVE — AB

## 2016-03-04 LAB — CBC
HEMATOCRIT: 38.6 % (ref 36.0–46.0)
HEMOGLOBIN: 13 g/dL (ref 12.0–15.0)
MCH: 29 pg (ref 26.0–34.0)
MCHC: 33.7 g/dL (ref 30.0–36.0)
MCV: 86 fL (ref 78.0–100.0)
Platelets: 219 10*3/uL (ref 150–400)
RBC: 4.49 MIL/uL (ref 3.87–5.11)
RDW: 13.5 % (ref 11.5–15.5)
WBC: 11.6 10*3/uL — AB (ref 4.0–10.5)

## 2016-03-04 LAB — ETHANOL

## 2016-03-04 MED ORDER — QUETIAPINE FUMARATE 50 MG PO TABS: 50.0000 mg | ORAL_TABLET | Freq: Every day | ORAL | 0 refills | Status: DC

## 2016-03-04 MED ORDER — OXYCODONE-ACETAMINOPHEN 10-325 MG PO TABS: 1.0000 | ORAL_TABLET | Freq: Two times a day (BID) | ORAL | 0 refills | Status: DC | PRN

## 2016-03-04 NOTE — ED Provider Notes (Signed)
[facility]-EMER[facility]ENCY DEPT Provider Note   CSN: [id number] Arrival date & time: 03/04/16  2016   By signing my name below, I, Vanessa [name], attest that this documentation has been prepared under the direction and in the presence of Vanessa Crumble, MD . Electronically Signed: Freida [name], Scribe. 03/04/2016. 11:26 PM.  History   Chief Complaint Chief Complaint  Patient presents with  . Medical Clearance    The history is provided by the patient. No language interpreter was used.     HPI Comments:  Vanessa [name] is a 60 y.o. female who presents to the Emergency Department for medication refill. Pt states she is out of her Percocet and Lyrica which she takes for chronic back pain. She notes she is also out of her Seroquel. Pt states Medicaid wont pay for her meds and she has been unable to see her PCP who was giving her samples so in the last week she started purchasing percocet on the street. Here PCP does not take medicaid patients anymore.  She admits she has also been purchasing marijuana. 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 She also admits to drinking ETOH when she hasn't had any in 10 years. Pt denies SI/HI.    Past Medical History:  Diagnosis Date  . Alcoholism (HCC)   . Allergic rhinitis   . Allergy   . Anxiety   . Arthritis    rheumatoid  . Asthma   . Back pain, chronic   . Chronic back pain 05/27/2012   Secondary to L4-5 herniated disc per patient  . Chronic headache   . COPD (chronic obstructive pulmonary disease) (HCC)   . Depression   . Diabetes mellitus   . Emphysema   . Emphysema of lung (HCC)   . Generalized anxiety disorder 05/26/2012  . GERD (gastroesophageal reflux disease)   . Grave's disease   . 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 Heart murmur   . Hernia,  umbilical   . Herniated disc   . Herniated disc   . Hyperlipidemia   . Hypertension   . Menopause   . Neuropathy (HCC)   . OSA (obstructive sleep apnea)    has a cpap  . Osteoporosis   . Rheumatoid arthritis (HCC)   . Shingles   . Sleep apnea    wear c-pap  . Tear of lateral meniscus of right knee 10/13/2013  . Wears dentures    top  . Wears glasses     Patient Active Problem List   Diagnosis Date Noted  . Atypical pneumonia 12/18/2014  . Essential hypertension 12/17/2014  . COPD exacerbation (HCC) 12/16/2014  . Tear of lateral meniscus of right knee 10/13/2013  . Chest pain 07/23/2013  . GERD (gastroesophageal reflux disease) 07/23/2013  . Morbid obesity (HCC) 07/23/2013  . Renal failure (ARF), acute on chronic (HCC) 07/23/2013  . HLD (hyperlipidemia) 07/23/2013  . Dysarthria 10/05/2012  . Diarrhea 05/27/2012  . Chronic back pain 05/27/2012  . Diabetes mellitus (HCC) 05/26/2012  . Hypothyroidism 05/26/2012  . 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 Depression 05/26/2012  . Generalized anxiety disorder 05/26/2012  . OSA (obstructive sleep apnea) 03/18/2011    Past Surgical History:  Procedure Laterality Date  . CHOLECYSTECTOMY    . EYE SURGERY  eye back in socker-rt  . FOOT SURGERY     cyst rt foot  . KNEE ARTHROSCOPY WITH LATERAL MENISECTOMY Right 10/13/2013   Procedure: RIGHT KNEE ARTHROSCOPY WITH PARTIAL LATERAL MENISECTOMY/DEBRIDEMENT/SHAVING ;  Surgeon: Eulas Post, MD;  Location: [facility];  Service: Orthopedics;  Laterality: Right;  . LAPAROSCOPY     age 5  . LEFT HEART CATHETERIZATION WITH CORONARY ANGIOGRAM N/A 05/30/2012   Procedure: LEFT HEART CATHETERIZATION WITH CORONARY ANGIOGRAM;  Surgeon: [name], MD;  Location: [facility] CATH LAB;  Service: Cardiovascular;  Laterality: N/A;  . TUBAL LIGATION      OB History    Gravida Para Term Preterm AB Living   5 3     2 3    SAB TAB Ectopic Multiple Live Births   2                Home Medications    Prior to Admission medications   Medication Sig Start Date End Date Taking? Authorizing Provider  acyclovir (ZOVIRAX) 400 MG tablet Take 400 mg by mouth 2 (two) times daily.     Historical Provider, MD  acyclovir (ZOVIRAX) 800 MG tablet Take 1 tablet (800 mg total) by mouth 4 (four) times daily. 09/09/15   11/09/15, NP  albuterol (PROAIR HFA) 108 (90 BASE) MCG/ACT inhaler Inhale 2 puffs into the lungs 4 (four) times daily. For COPD    Historical Provider, MD  atorvastatin (LIPITOR) 40 MG tablet Take 1 tablet (40 mg total) by mouth daily. 10/06/12   12/07/12, DO  azithromycin (ZITHROMAX) 250 MG tablet Take 1 tablet (250 mg total) by mouth daily. Take first 2 tablets together, then 1 every day until finished. 01/08/16   03/09/16, FNP  BuPROPion HCl (WELLBUTRIN PO) Take 1 tablet by mouth 2 (two) times daily.    Historical Provider, MD  busPIRone (BUSPAR) 10 MG tablet Take 20 mg by mouth 3 (three) times daily.     Historical Provider, MD  cyclobenzaprine (FLEXERIL) 10 MG tablet 1 tablet 3 times a day 04/14/15   04/16/15, PA  cycloSPORINE (RESTASIS) 0.05 % ophthalmic emulsion Place 1 drop into both eyes 2 (two) times daily.    Historical Provider, MD  diazepam (VALIUM) 5 MG tablet Take 1 tablet (5 mg total) by mouth 2 (two) times daily. 08/06/15   10/06/15, MD  diclofenac sodium (VOLTAREN) 1 % GEL Apply 2 g topically 4 (four) times daily as needed (pain).     Historical Provider, MD  FLUoxetine (PROZAC) 20 MG capsule Take 60 mg by mouth every morning.     Historical Provider, MD  gabapentin (NEURONTIN) 300 MG capsule Take 1 capsule (300 mg total) by mouth 3 (three) times daily. 01/08/16   03/09/16, FNP  glipiZIDE (GLUCOTROL) 10 MG tablet Take 10 mg by mouth 2 (two) times daily before a meal.    Historical Provider, MD  HYDROcodone-acetaminophen (NORCO) 10-325 MG tablet Take 1 tablet by mouth every 6 (six) hours as needed. 08/10/15    08/12/15, MD  HYDROcodone-acetaminophen (NORCO/VICODIN) 5-325 MG tablet Take 1-2 tablets by mouth every 6 (six) hours as needed for severe pain. 12/17/15   12/19/15, MD  HYDROcodone-homatropine [facility]) 5-1.5 MG/5ML syrup Take 5 mLs by mouth every 6 (six) hours as needed for cough. 08/03/15   10/03/15, PA-C  ipratropium-albuterol (DUONEB) 0.5-2.5 (3) MG/3ML SOLN Take 3 mLs by nebulization every 6 (six) hours as needed (  Asthma).    Historical Provider, MD  levothyroxine (SYNTHROID, LEVOTHROID) 175 MCG tablet Take 175 mcg by mouth every morning.     Historical Provider, MD  lidocaine (LIDODERM) 5 % Place 1 patch onto the skin daily. Remove & Discard patch within 12 hours or as directed by MD 08/03/15   Riki SheerMichelle G Young, PA-C  lisinopril (PRINIVIL,ZESTRIL) 10 MG tablet Take 10 mg by mouth daily.    Historical Provider, MD  loratadine (CLARITIN) 10 MG tablet Take 10 mg by mouth daily as needed for allergies.    Historical Provider, MD  meloxicam (MOBIC) 15 MG tablet Take 15 mg by mouth daily.    Historical Provider, MD  metFORMIN (GLUCOPHAGE) 1000 MG tablet Take 1,000 mg by mouth 2 (two) times daily.     Historical Provider, MD  methocarbamol (ROBAXIN) 500 MG tablet Take 1 tablet (500 mg total) by mouth 2 (two) times daily. 12/07/14   Fayrene HelperBowie Tran, PA-C  methylPREDNISolone (MEDROL DOSEPAK) 4 MG TBPK tablet Take 6-5-4-3-2-1 po qd 01/08/16   Deatra CanterWilliam J Oxford, FNP  naproxen (NAPROSYN) 500 MG tablet Take 1 tablet (500 mg total) by mouth 2 (two) times daily. 08/10/15   Charm RingsErin J Honig, MD  Olopatadine HCl (PATADAY) 0.2 % SOLN Apply 1 drop to eye daily. 12/21/14   Charm RingsErin J Honig, MD  predniSONE (DELTASONE) 20 MG tablet Take 3 tabs po on first day, 2 tabs second day, 2 tabs third day, 1 tab fourth day, 1 tab 5th day. Take with food. 02/02/16   Hayden Rasmussenavid Mabe, NP  pregabalin (LYRICA) 75 MG capsule Take 1 capsule (75 mg total) by mouth 3 (three) times daily. 12/17/15   Arnaldo NatalMichael S Diamond, MD  QUEtiapine (SEROQUEL)  25 MG tablet Take 50 mg by mouth at bedtime.     Historical Provider, MD  sennosides-docusate sodium (SENOKOT-S) 8.6-50 MG tablet Take 2 tablets by mouth daily. Patient taking differently: Take 2 tablets by mouth daily as needed for constipation.  10/13/13   Teryl LucyJoshua Landau, MD  tiotropium (SPIRIVA) 18 MCG inhalation capsule Place 1 capsule (18 mcg total) into inhaler and inhale daily. 12/18/14   Valentino NoseNathan Boswell, MD  traMADol (ULTRAM) 50 MG tablet Take 1 tablet (50 mg total) by mouth every 6 (six) hours as needed. 02/02/16   Hayden Rasmussenavid Mabe, NP  verapamil (CALAN) 40 MG tablet Take 40 mg by mouth 3 (three) times daily.    Historical Provider, MD    Family History Family History  Problem Relation Age of Onset  . Emphysema Mother   . Allergies Mother   . Asthma Mother   . Heart disease Mother   . Rheum arthritis Mother   . Diabetes Mother   . Kidney disease Mother   . Allergies Daughter   . Asthma Brother   . Breast cancer Other     aunt  . Lung cancer Brother   . Throat cancer Brother   . Colon cancer Neg Hx   . Colon polyps Neg Hx   . Esophageal cancer Neg Hx   . Rectal cancer Neg Hx   . Stomach cancer Neg Hx     Social History Social History  Substance Use Topics  . Smoking status: Current Every Day Smoker    Packs/day: 1.00    Years: 40.00    Types: Cigarettes  . Smokeless tobacco: Never Used  . Alcohol use No     Allergies   Ciprofloxacin; Fluocinolone; and Penicillins   Review of Systems Review of Systems 10 systems reviewed  and all are negative for acute change except as noted in the HPI.   Physical Exam Updated Vital Signs BP 143/95   Pulse 90   Temp 99.2 F (37.3 C) (Oral)   Resp 16   SpO2 98%   Physical Exam  Constitutional: She is oriented to person, place, and time. She appears well-developed and well-nourished. No distress.  HENT:  Head: Normocephalic and atraumatic.  Nose: Nose normal.  Mouth/Throat: Oropharynx is clear and moist. No oropharyngeal  exudate.  Eyes: Conjunctivae and EOM are normal. Pupils are equal, round, and reactive to light. No scleral icterus.  Neck: Normal range of motion. Neck supple. No JVD present. No tracheal deviation present. No thyromegaly present.  Cardiovascular: Normal rate, regular rhythm and normal heart sounds.  Exam reveals no gallop and no friction rub.   No murmur heard. Pulmonary/Chest: Effort normal and breath sounds normal. No respiratory distress. She has no wheezes. She exhibits no tenderness.  Abdominal: Soft. Bowel sounds are normal. She exhibits no distension and no mass. There is no tenderness. There is no rebound and no guarding.  Musculoskeletal: Normal range of motion. She exhibits no edema or tenderness.  Lymphadenopathy:    She has no cervical adenopathy.  Neurological: She is alert and oriented to person, place, and time. No cranial nerve deficit. She exhibits normal muscle tone.  Skin: Skin is warm and dry. No rash noted. No erythema. No pallor.  Nursing note and vitals reviewed.    ED Treatments / Results  DIAGNOSTIC STUDIES:  Oxygen Saturation is 98% on RA, normal by my interpretation.    COORDINATION OF CARE:  11:26 PM Discussed treatment plan with pt at bedside and pt agreed to plan.  Labs (all labs ordered are listed, but only abnormal results are displayed) Labs Reviewed  COMPREHENSIVE METABOLIC PANEL - Abnormal; Notable for the following:       Result Value   Glucose, Bld 140 (*)    Creatinine, Ser 1.18 (*)    GFR calc non Af Amer 50 (*)    GFR calc Af Amer 58 (*)    All other components within normal limits  CBC - Abnormal; Notable for the following:    WBC 11.6 (*)    All other components within normal limits  RAPID URINE DRUG SCREEN, HOSP PERFORMED - Abnormal; Notable for the following:    Opiates POSITIVE (*)    Tetrahydrocannabinol POSITIVE (*)    All other components within normal limits  ETHANOL    EKG  EKG Interpretation None        Radiology No results found.  Procedures Procedures (including critical care time)  Medications Ordered in ED Medications - No data to display   Initial Impression / Assessment and Plan / ED Course  I have reviewed the triage vital signs and the nursing notes.  Pertinent labs & imaging results that were available during my care of the patient were reviewed by me and considered in my medical decision making (see chart for details).  Clinical Course     Patient presents to the ED for med refill.  History also reveals some depression but no SI.  She was counseled on the need to find a PCP who takes medicaid and a referral was given.  Will give 12 tabs of percocet to help with her pain and deter her from buy drugs on the streets.  Also seroquel refilled for sleep.  She was advised that she can not continue coming to the ED for  med refills and she demonstrates good understanding.   She appears well and in NAD.  There are no further complaints and patient is safe for DC.   Final Clinical Impressions(s) / ED Diagnoses   Final diagnoses:  None    New Prescriptions New Prescriptions   No medications on file     I personally performed the services described in this documentation, which was scribed in my presence. The recorded information has been reviewed and is accurate.       Vanessa Crumble, MD 03/04/16 2340

## 2016-03-04 NOTE — ED Notes (Signed)
ED Provider at bedside. 

## 2016-03-04 NOTE — ED Triage Notes (Signed)
Pt has been taking opioids x 9 years. Pt has not had a PCP since September, who was originally prescribing medications. Pt has been buying opioids on the street, reports taking 25 tablets since Friday. Pt also reports chroniic pain, has not been on her Lyrica since September. Pt denies SI/HI, requesting detox

## 2016-03-10 ENCOUNTER — Encounter (HOSPITAL_COMMUNITY): Payer: Self-pay | Admitting: *Deleted

## 2016-03-10 ENCOUNTER — Emergency Department (HOSPITAL_COMMUNITY)
Admission: EM | Admit: 2016-03-10 | Discharge: 2016-03-10 | Disposition: A | Discharge status: Home / Self Care | Payer: Medicaid Other | Attending: Emergency Medicine | Admitting: Emergency Medicine

## 2016-03-10 DIAGNOSIS — Z79899 Other long term (current) drug therapy: Secondary | ICD-10-CM | POA: Insufficient documentation

## 2016-03-10 DIAGNOSIS — B029 Zoster without complications: Secondary | ICD-10-CM | POA: Diagnosis not present

## 2016-03-10 DIAGNOSIS — M545 Low back pain, unspecified: Secondary | ICD-10-CM

## 2016-03-10 DIAGNOSIS — I10 Essential (primary) hypertension: Secondary | ICD-10-CM | POA: Insufficient documentation

## 2016-03-10 DIAGNOSIS — M791 Myalgia: Secondary | ICD-10-CM | POA: Insufficient documentation

## 2016-03-10 DIAGNOSIS — E039 Hypothyroidism, unspecified: Secondary | ICD-10-CM | POA: Insufficient documentation

## 2016-03-10 DIAGNOSIS — G8929 Other chronic pain: Secondary | ICD-10-CM

## 2016-03-10 DIAGNOSIS — J449 Chronic obstructive pulmonary disease, unspecified: Secondary | ICD-10-CM | POA: Insufficient documentation

## 2016-03-10 DIAGNOSIS — F17210 Nicotine dependence, cigarettes, uncomplicated: Secondary | ICD-10-CM | POA: Diagnosis not present

## 2016-03-10 DIAGNOSIS — E1140 Type 2 diabetes mellitus with diabetic neuropathy, unspecified: Secondary | ICD-10-CM | POA: Diagnosis not present

## 2016-03-10 DIAGNOSIS — Z7984 Long term (current) use of oral hypoglycemic drugs: Secondary | ICD-10-CM | POA: Diagnosis not present

## 2016-03-10 DIAGNOSIS — M7918 Myalgia, other site: Secondary | ICD-10-CM

## 2016-03-10 DIAGNOSIS — R21 Rash and other nonspecific skin eruption: Secondary | ICD-10-CM | POA: Diagnosis present

## 2016-03-10 LAB — BASIC METABOLIC PANEL
ANION GAP: 7 (ref 5–15)
BUN: 9 mg/dL (ref 6–20)
CHLORIDE: 104 mmol/L (ref 101–111)
CO2: 29 mmol/L (ref 22–32)
Calcium: 9.1 mg/dL (ref 8.9–10.3)
Creatinine, Ser: 0.96 mg/dL (ref 0.44–1.00)
GFR calc Af Amer: >60 mL/min (ref >60)
GFR calc non Af Amer: >60 mL/min (ref >60)
GLUCOSE: 102 mg/dL — AB (ref 65–99)
POTASSIUM: 3.9 mmol/L (ref 3.5–5.1)
SODIUM: 140 mmol/L (ref 135–145)

## 2016-03-10 LAB — RAPID URINE DRUG SCREEN, HOSP PERFORMED
Amphetamines: NOT DETECTED
BARBITURATES: NOT DETECTED
Benzodiazepines: NOT DETECTED
COCAINE: NOT DETECTED
Opiates: POSITIVE — AB
Tetrahydrocannabinol: POSITIVE — AB

## 2016-03-10 LAB — CBC
HCT: 38.2 % (ref 36.0–46.0)
HEMOGLOBIN: 12.5 g/dL (ref 12.0–15.0)
MCH: 28.3 pg (ref 26.0–34.0)
MCHC: 32.7 g/dL (ref 30.0–36.0)
MCV: 86.6 fL (ref 78.0–100.0)
PLATELETS: 224 10*3/uL (ref 150–400)
RBC: 4.41 MIL/uL (ref 3.87–5.11)
RDW: 13.7 % (ref 11.5–15.5)
WBC: 10.8 10*3/uL — AB (ref 4.0–10.5)

## 2016-03-10 MED ORDER — ONDANSETRON 4 MG PO TBDP
8.0000 mg | ORAL_TABLET | Freq: Once | ORAL | Status: AC
  Administered 2016-03-10: 8 mg via ORAL
  Filled 2016-03-10: qty 2

## 2016-03-10 MED ORDER — ACYCLOVIR 800 MG PO TABS: 800.0000 mg | ORAL_TABLET | Freq: Every day | ORAL | 0 refills | Status: AC

## 2016-03-10 MED ORDER — LISINOPRIL 10 MG PO TABS: 10.0000 mg | ORAL_TABLET | Freq: Every day | ORAL | 0 refills | Status: DC

## 2016-03-10 MED ORDER — NAPROXEN 500 MG PO TABS: 500.0000 mg | ORAL_TABLET | Freq: Two times a day (BID) | ORAL | 0 refills | Status: DC

## 2016-03-10 MED ORDER — METFORMIN HCL 1000 MG PO TABS: 1000.0000 mg | ORAL_TABLET | Freq: Two times a day (BID) | ORAL | 0 refills | Status: AC

## 2016-03-10 MED ORDER — LIDOCAINE 5 % EX PTCH: 1.0000 | MEDICATED_PATCH | CUTANEOUS | 0 refills | Status: DC

## 2016-03-10 MED ORDER — TRAMADOL HCL 50 MG PO TABS: 50.0000 mg | ORAL_TABLET | Freq: Four times a day (QID) | ORAL | 0 refills | Status: DC | PRN

## 2016-03-10 MED ORDER — LEVOTHYROXINE SODIUM 175 MCG PO TABS: 175.0000 ug | ORAL_TABLET | Freq: Every morning | ORAL | 0 refills | Status: DC

## 2016-03-10 MED ORDER — OXYCODONE-ACETAMINOPHEN 5-325 MG PO TABS
1.0000 | ORAL_TABLET | Freq: Once | ORAL | Status: AC
  Administered 2016-03-10: 1 via ORAL
  Filled 2016-03-10: qty 1

## 2016-03-10 NOTE — Care Management (Addendum)
ED CM noted patient has had 5 ED visits in the past 6 months.Met with patient to discuss establishing primary care, when patient stated, " I really want to stop using narcotics"  She reports her PCP discharged her from the practice due to narcotic seeking. She was in a [facility] treatment program previously and states she is fearful about going back to the life of drugs.  CM offered substance abuse treatment resources CM can fax the referral for detox to [facility], patient is agreeable and appreciative of the assistance. Updated Stevphen Rochester PA-C he is agreeable with discharge plan.  Patient signed consent a release of HI form. CM will fax labs and ED H&P to [facility].  [phone]. Patient states she will find transportation to [facility] in [place].  Patient awaiting medical clearance for discharge.

## 2016-03-10 NOTE — Discharge Instructions (Signed)
Please read and follow all provided instructions.  Your diagnoses today include:  1. Chronic midline low back pain without sciatica   2. Musculoskeletal pain   3. Herpes zoster without complication    Tests performed today include: Vital signs. See below for your results today.   Medications prescribed:  Take as prescribed   Home care instructions:  Follow any educational materials contained in this packet.  Follow-up instructions: Please follow-up with your primary care provider for further evaluation of symptoms and treatment   Return instructions:  Please return to the Emergency Department if you do not get better, if you get worse, or new symptoms OR  - Fever (temperature greater than 101.50F)  - Bleeding that does not stop with holding pressure to the area    -Severe pain (please note that you may be more sore the day after your accident)  - Chest Pain  - Difficulty breathing  - Severe nausea or vomiting  - Inability to tolerate food and liquids  - Passing out  - Skin becoming red around your wounds  - Change in mental status (confusion or lethargy)  - New numbness or weakness    Please return if you have any other emergent concerns.  Additional Information:  Your vital signs today were: BP 144/93 (BP Location: Left Arm)    Pulse 94    Temp 98.5 F (36.9 C) (Oral)    Resp 18    Ht 5\' 7"  (1.702 m)    Wt 98.4 kg    SpO2 98%    BMI 33.99 kg/m  If your blood pressure (BP) was elevated above 135/85 this visit, please have this repeated by your doctor within one month. --------------

## 2016-03-10 NOTE — ED Notes (Signed)
Care handoff to [place], [place].

## 2016-03-10 NOTE — ED Triage Notes (Signed)
Pt reports having chronic pain and unable to get her pain meds. Pt has reoccurring shingles rash that occurs with stress. Rash is on lower back.

## 2016-03-10 NOTE — ED Notes (Signed)
Case manager in room with patient at this time.

## 2016-03-10 NOTE — ED Provider Notes (Signed)
[facility]-EMER[facility]ENCY DEPT Provider Note   CSN: [id number] Arrival date & time: 03/10/16  1353  By signing my name below, I, Placido Sou, attest that this documentation has been prepared under the direction and in the presence of Audry Pili, PA-C. Electronically Signed: Placido Sou, ED Scribe. 03/10/16. 3:40 PM.   History   Chief Complaint Chief Complaint  Patient presents with  . Pain  . Rash    HPI HPI Comments: Vanessa [name] is a 60 y.o. female who presents to the Emergency Department requesting a medication refill. Pt states she was seen on 03/05/2016 with chronic pain and was given hydrocodone and Seroquel. Pt attempted to make an appointment with [facility] but could not get one until January of 2018 and "hung up because I was on a minute phone and was on hold". She regularly takes valium, 10 mg hydrocodone, Prozac and Lyrica. She states she has chronic knee and back pain as well as neuropathy. Pt states she has been attempting to find a PCP for many months but cannot do so due to her list of regular medications. She states she is currently experiencing lower back pain as well as a mild rash to the bridge of her nose and her right lower back which she believes is related to recurrent shingles. Pt has acyclovir at home which she began taking on 03/06/2016. No new symptoms noted.   The history is provided by the patient and medical records. No language interpreter was used.    Past Medical History:  Diagnosis Date  . Alcoholism (HCC)   . Allergic rhinitis   . Allergy   . Anxiety   . Arthritis    rheumatoid  . Asthma   . Back pain, chronic   . Chronic back pain 05/27/2012   Secondary to L4-5 herniated disc per patient  . Chronic headache   . COPD (chronic obstructive pulmonary disease) (HCC)   . Depression   . Diabetes mellitus   . Emphysema   . Emphysema of lung (HCC)   . Generalized anxiety disorder 05/26/2012  . GERD (gastroesophageal reflux disease)     . Grave's disease   . 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 Heart murmur   . Hernia, umbilical   . Herniated disc   . Herniated disc   . Hyperlipidemia   . Hypertension   . Menopause   . Neuropathy (HCC)   . OSA (obstructive sleep apnea)    has a cpap  . Osteoporosis   . Rheumatoid arthritis (HCC)   . Shingles   . Sleep apnea    wear c-pap  . Tear of lateral meniscus of right knee 10/13/2013  . Wears dentures    top  . Wears glasses     Patient Active Problem List   Diagnosis Date Noted  . Atypical pneumonia 12/18/2014  . Essential hypertension 12/17/2014  . COPD exacerbation (HCC) 12/16/2014  . Tear of lateral meniscus of right knee 10/13/2013  . Chest pain 07/23/2013  . GERD (gastroesophageal reflux disease) 07/23/2013  . Morbid obesity (HCC) 07/23/2013  . Renal failure (ARF), acute on chronic (HCC) 07/23/2013  . HLD (hyperlipidemia) 07/23/2013  . Dysarthria 10/05/2012  . Diarrhea 05/27/2012  . Chronic back pain 05/27/2012  . Diabetes mellitus (HCC) 05/26/2012  . Hypothyroidism 05/26/2012  . 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 Depression 05/26/2012  . Generalized anxiety disorder 05/26/2012  . OSA (obstructive sleep apnea) 03/18/2011  Past Surgical History:  Procedure Laterality Date  . CHOLECYSTECTOMY    . EYE SURGERY     eye back in socker-rt  . FOOT SURGERY     cyst rt foot  . KNEE ARTHROSCOPY WITH LATERAL MENISECTOMY Right 10/13/2013   Procedure: RIGHT KNEE ARTHROSCOPY WITH PARTIAL LATERAL MENISECTOMY/DEBRIDEMENT/SHAVING ;  Surgeon: Eulas Post, MD;  Location: [facility];  Service: Orthopedics;  Laterality: Right;  . LAPAROSCOPY     age 59  . LEFT HEART CATHETERIZATION WITH CORONARY ANGIOGRAM N/A 05/30/2012   Procedure: LEFT HEART CATHETERIZATION WITH CORONARY ANGIOGRAM;  Surgeon: [name], MD;  Location: [facility] CATH LAB;  Service: Cardiovascular;   Laterality: N/A;  . TUBAL LIGATION      OB History    Gravida Para Term Preterm AB Living   5 3     2 3    SAB TAB Ectopic Multiple Live Births   2               Home Medications    Prior to Admission medications   Medication Sig Start Date End Date Taking? Authorizing Provider  acyclovir (ZOVIRAX) 400 MG tablet Take 400 mg by mouth 2 (two) times daily.     Historical Provider, MD  acyclovir (ZOVIRAX) 800 MG tablet Take 1 tablet (800 mg total) by mouth 4 (four) times daily. 09/09/15   11/09/15, NP  albuterol (PROAIR HFA) 108 (90 BASE) MCG/ACT inhaler Inhale 2 puffs into the lungs 4 (four) times daily. For COPD    Historical Provider, MD  atorvastatin (LIPITOR) 40 MG tablet Take 1 tablet (40 mg total) by mouth daily. 10/06/12   12/07/12, DO  azithromycin (ZITHROMAX) 250 MG tablet Take 1 tablet (250 mg total) by mouth daily. Take first 2 tablets together, then 1 every day until finished. 01/08/16   03/09/16, FNP  BuPROPion HCl (WELLBUTRIN PO) Take 1 tablet by mouth 2 (two) times daily.    Historical Provider, MD  busPIRone (BUSPAR) 10 MG tablet Take 20 mg by mouth 3 (three) times daily.     Historical Provider, MD  cyclobenzaprine (FLEXERIL) 10 MG tablet 1 tablet 3 times a day 04/14/15   04/16/15, PA  cycloSPORINE (RESTASIS) 0.05 % ophthalmic emulsion Place 1 drop into both eyes 2 (two) times daily.    Historical Provider, MD  diazepam (VALIUM) 5 MG tablet Take 1 tablet (5 mg total) by mouth 2 (two) times daily. 08/06/15   10/06/15, MD  diclofenac sodium (VOLTAREN) 1 % GEL Apply 2 g topically 4 (four) times daily as needed (pain).     Historical Provider, MD  FLUoxetine (PROZAC) 20 MG capsule Take 60 mg by mouth every morning.     Historical Provider, MD  gabapentin (NEURONTIN) 300 MG capsule Take 1 capsule (300 mg total) by mouth 3 (three) times daily. 01/08/16   03/09/16, FNP  glipiZIDE (GLUCOTROL) 10 MG tablet Take 10 mg by mouth 2 (two) times daily  before a meal.    Historical Provider, MD  HYDROcodone-acetaminophen (NORCO) 10-325 MG tablet Take 1 tablet by mouth every 6 (six) hours as needed. 08/10/15   08/12/15, MD  HYDROcodone-acetaminophen (NORCO/VICODIN) 5-325 MG tablet Take 1-2 tablets by mouth every 6 (six) hours as needed for severe pain. 12/17/15   12/19/15, MD  HYDROcodone-homatropine [facility]) 5-1.5 MG/5ML syrup Take 5 mLs by mouth every 6 (six) hours as needed for cough. 08/03/15   10/03/15  Young, PA-C  ipratropium-albuterol (DUONEB) 0.5-2.5 (3) MG/3ML SOLN Take 3 mLs by nebulization every 6 (six) hours as needed (Asthma).    Historical Provider, MD  levothyroxine (SYNTHROID, LEVOTHROID) 175 MCG tablet Take 175 mcg by mouth every morning.     Historical Provider, MD  lidocaine (LIDODERM) 5 % Place 1 patch onto the skin daily. Remove & Discard patch within 12 hours or as directed by MD 08/03/15   Riki Sheer, PA-C  lisinopril (PRINIVIL,ZESTRIL) 10 MG tablet Take 10 mg by mouth daily.    Historical Provider, MD  loratadine (CLARITIN) 10 MG tablet Take 10 mg by mouth daily as needed for allergies.    Historical Provider, MD  meloxicam (MOBIC) 15 MG tablet Take 15 mg by mouth daily.    Historical Provider, MD  metFORMIN (GLUCOPHAGE) 1000 MG tablet Take 1,000 mg by mouth 2 (two) times daily.     Historical Provider, MD  methocarbamol (ROBAXIN) 500 MG tablet Take 1 tablet (500 mg total) by mouth 2 (two) times daily. 12/07/14   Fayrene Helper, PA-C  methylPREDNISolone (MEDROL DOSEPAK) 4 MG TBPK tablet Take 6-5-4-3-2-1 po qd 01/08/16   Deatra Canter, FNP  naproxen (NAPROSYN) 500 MG tablet Take 1 tablet (500 mg total) by mouth 2 (two) times daily. 08/10/15   Charm Rings, MD  Olopatadine HCl (PATADAY) 0.2 % SOLN Apply 1 drop to eye daily. 12/21/14   Charm Rings, MD  oxyCODONE-acetaminophen (PERCOCET) 10-325 MG tablet Take 1 tablet by mouth 2 (two) times daily as needed for pain. 03/04/16   [name], MD  predniSONE (DELTASONE) 20  MG tablet Take 3 tabs po on first day, 2 tabs second day, 2 tabs third day, 1 tab fourth day, 1 tab 5th day. Take with food. 02/02/16   Hayden Rasmussen, NP  pregabalin (LYRICA) 75 MG capsule Take 1 capsule (75 mg total) by mouth 3 (three) times daily. 12/17/15   Arnaldo Natal, MD  QUEtiapine (SEROQUEL) 50 MG tablet Take 1 tablet (50 mg total) by mouth at bedtime. 03/04/16   [name], MD  sennosides-docusate sodium (SENOKOT-S) 8.6-50 MG tablet Take 2 tablets by mouth daily. Patient taking differently: Take 2 tablets by mouth daily as needed for constipation.  10/13/13   Teryl Lucy, MD  tiotropium (SPIRIVA) 18 MCG inhalation capsule Place 1 capsule (18 mcg total) into inhaler and inhale daily. 12/18/14   Valentino Nose, MD  traMADol (ULTRAM) 50 MG tablet Take 1 tablet (50 mg total) by mouth every 6 (six) hours as needed. 02/02/16   Hayden Rasmussen, NP  verapamil (CALAN) 40 MG tablet Take 40 mg by mouth 3 (three) times daily.    Historical Provider, MD    Family History Family History  Problem Relation Age of Onset  . Emphysema Mother   . Allergies Mother   . Asthma Mother   . Heart disease Mother   . Rheum arthritis Mother   . Diabetes Mother   . Kidney disease Mother   . Allergies Daughter   . Asthma Brother   . Breast cancer Other     aunt  . Lung cancer Brother   . Throat cancer Brother   . Colon cancer Neg Hx   . Colon polyps Neg Hx   . Esophageal cancer Neg Hx   . Rectal cancer Neg Hx   . Stomach cancer Neg Hx     Social History Social History  Substance Use Topics  . Smoking status: Current Every Day Smoker  Packs/day: 1.00    Years: 40.00    Types: Cigarettes  . Smokeless tobacco: Never Used  . Alcohol use No     Allergies   Ciprofloxacin; Fluocinolone; and Penicillins   Review of Systems Review of Systems  Musculoskeletal: Positive for back pain and myalgias.  Skin: Positive for color change and rash.   Physical Exam Updated Vital Signs BP 144/93 (BP  Location: Left Arm)   Pulse 94   Temp 98.5 F (36.9 C) (Oral)   Resp 18   Ht 5\' 7"  (1.702 m)   Wt 217 lb (98.4 kg)   SpO2 98%   BMI 33.99 kg/m   Physical Exam  Constitutional: She is oriented to person, place, and time. Vital signs are normal. She appears well-developed and well-nourished.  HENT:  Head: Normocephalic and atraumatic.  Right Ear: Hearing normal.  Left Ear: Hearing normal.  Eyes: Conjunctivae and EOM are normal. Pupils are equal, round, and reactive to light.  Neck: Normal range of motion. Neck supple.  Cardiovascular: Normal rate, regular rhythm, normal heart sounds and intact distal pulses.   Pulmonary/Chest: Effort normal and breath sounds normal. No respiratory distress.  Abdominal: Soft.  Musculoskeletal: Normal range of motion.  Neurological: She is alert and oriented to person, place, and time.  Skin: Skin is warm and dry. Rash noted. No erythema.  Above right gluteal cleft there is a dermatomal rash consistent with shingles. No erythema, swelling or signs of infection.   Psychiatric: She has a normal mood and affect. Her speech is normal and behavior is normal. Thought content normal.  Nursing note and vitals reviewed.  ED Treatments / Results  Labs (all labs ordered are listed, but only abnormal results are displayed) Labs Reviewed - No data to display  EKG  EKG Interpretation None       Radiology No results found.  Procedures Procedures  DIAGNOSTIC STUDIES: Oxygen Saturation is 98% on RA, normal by my interpretation.    COORDINATION OF CARE: 3:40 PM Discussed next steps with pt. Pt verbalized understanding and is agreeable with the plan.    Medications Ordered in ED Medications - No data to display   Initial Impression / Assessment and Plan / ED Course  I have reviewed the triage vital signs and the nursing notes.  Pertinent labs & imaging results that were available during my care of the patient were reviewed by me and considered in  my medical decision making (see chart for details).  Clinical Course    Final Clinical Impressions(s) / ED Diagnoses  {I have reviewed and evaluated the relevant laboratory values.   {I have reviewed the relevant previous healthcare records.  {I obtained HPI from historian.   ED Course:  Assessment: Pt is a 60yF with hx chronic pain, multiple ED visit who presents with medication refill as well as possible shingles. Pt seen on and off for several months since moving from [place] to [place]. Does not have established PCP. Seen in ED on 03-04-16 for same. Given Rx #12 percocet. On exam, pt in NAD. Nontoxic/nonseptic appearing. VSS. Afebrile. Lungs CTA. Heart RRR. Rash above gluteal cleft consistent with shingles .Dermatomal pattern. Given analgesia in ED. Consulted case management due to frequent visits and will assist. Will also assist patient with Detox from narcotics as patient interested in help. Will obtain CBC/BMP as well as UDS for medical clearance. Pt otherwise medically cleared if labs are unremarkable. Counseled on Narcotic abuse. Reveals some depression from chronic pain, but no SI. Advised  to not continue to return to ED for medication refills. Plan is to DC home with follow up to PCP. Refilled necessary home medications for BP, Thyroid. Will Treat pain with Naprosyn, Lidoderm Patch. Will treat shingles with Acyclovir. At time of discharge, Patient is in no acute distress. Vital Signs are stable. Patient is able to ambulate. Patient able to tolerate PO.    Disposition/Plan:  DC Home Additional Verbal discharge instructions given and discussed with patient.  Pt Instructed to f/u with PCP in the next week for evaluation and treatment of symptoms. Return precautions given Pt acknowledges and agrees with plan  Supervising Physician Linwood Dibbles, MD  Final diagnoses:  Chronic midline low back pain without sciatica  Musculoskeletal pain  Herpes zoster without complication    New  Prescriptions Current Discharge Medication List       Audry Pili, PA-C 03/10/16 [address] PA-C 03/10/16 1708    Linwood Dibbles, MD 03/11/16 1256

## 2016-03-10 NOTE — ED Notes (Signed)
PT reports she has chronic pain and is no longer seeing her PCP because the office closed. PT was seen in ED 03/05/16 and given percocet tablets, she is out of those. PT believes she has a shingles rash to right buttocks. PT reports this appeared 03/06/16. PT has acyclovir and has been taking it since rash appeared. PT also has a lidocaine patch on rash.

## 2016-03-11 NOTE — Progress Notes (Addendum)
[name] engaged with Patient in the waiting room lobby on today 03/11/2016. Patient reports that she was referred to [facility] in [place] by RN Case Manager on yesterday evening but when she arrived to [facility] today, they informed her that they had not received a referral and also informed her that while they do detox (3-7 days), they only do residential treatment for males. Patient reports that she was then sent to [facility] in [place] but does not need mental health treatment and is looking for substance abuse treatment. Patient reports that she is need of pain medication and inquired about obtaining a prescription from the ED. [name] informed Patient that Patient was seen yesterday and that ED Physician would likely not prescribe any narcotics if they were not prescribed on yesterday. [name] provided Patient with information for local opioid treatment facilities. [name] encouraged Patient to utilize these facilities new patient walk in process to obtain medication-assisted treatment. Patient also reports her PCP discharged her from the practice due to narcotic seeking and she is in need of a PCP. [name] encouraged Patient to contact DSS as they will need to assign her a PCP on her Medicaid Card. Patient provided [name] with current medicaid card which has [employer] Department of [employer] on it. Patient reports that she was unaware of this and agreed to go to [facility] for medical needs. Patient also agreed to utilize opoid treatment facilities for substance abuse treatment. This [name] signing off.     Enos Fling, MSW, LCSW [facility] ED/53M Clinical Social Worker [phone]

## 2016-03-12 ENCOUNTER — Encounter (HOSPITAL_COMMUNITY): Payer: Self-pay | Admitting: Family Medicine

## 2016-03-12 ENCOUNTER — Ambulatory Visit (HOSPITAL_COMMUNITY)
Admission: EM | Admit: 2016-03-12 | Discharge: 2016-03-12 | Disposition: A | Discharge status: Home / Self Care | Payer: Medicaid Other | Attending: Emergency Medicine | Admitting: Emergency Medicine

## 2016-03-12 DIAGNOSIS — S39012A Strain of muscle, fascia and tendon of lower back, initial encounter: Secondary | ICD-10-CM

## 2016-03-12 LAB — POCT URINALYSIS DIP (DEVICE)
Bilirubin Urine: NEGATIVE
Glucose, UA: NEGATIVE mg/dL
HGB URINE DIPSTICK: NEGATIVE
Ketones, ur: NEGATIVE mg/dL
Leukocytes, UA: NEGATIVE
NITRITE: NEGATIVE
PH: 6 (ref 5.0–8.0)
PROTEIN: NEGATIVE mg/dL
UROBILINOGEN UA: 0.2 mg/dL (ref 0.0–1.0)

## 2016-03-12 MED ORDER — KETOROLAC TROMETHAMINE 60 MG/2ML IM SOLN
INTRAMUSCULAR | Status: AC
  Filled 2016-03-12: qty 2

## 2016-03-12 MED ORDER — KETOROLAC TROMETHAMINE 60 MG/2ML IM SOLN
60.0000 mg | Freq: Once | INTRAMUSCULAR | Status: AC
  Administered 2016-03-12: 60 mg via INTRAMUSCULAR

## 2016-03-12 NOTE — ED Triage Notes (Signed)
Pt here for right back pain and flank pain. sts it has been going on for 2 weeks and she was told she was dehydrated in the ER and told to drink lots of fluids. sts she is also withdrawing from pain meds and has an appointment on Monday with Dr. Bruna Potter. sts also shingles ion lower back.

## 2016-03-12 NOTE — ED Provider Notes (Signed)
CSN: [id number]     Arrival date & time 03/12/16  1213 History   First MD Initiated Contact with Patient 03/12/16 1240     Chief Complaint  Patient presents with  . Back Pain   (Consider location/radiation/quality/duration/timing/severity/associated sxs/prior Treatment) Patient c/o right lower back pain and worried it is her kidneys.  She states she is going throught withdrawals from opiates and has to buy them off the street.  She is going to see her pcp in a few days.    The history is provided by the patient.  Back Pain  Location:  Generalized Quality:  Aching Radiates to:  Does not radiate Pain severity:  Moderate Pain is:  Same all the time Duration:  2 days Timing:  Constant Chronicity:  New Context: emotional stress   Relieved by:  Nothing Worsened by:  Nothing Ineffective treatments:  None tried   Past Medical History:  Diagnosis Date  . Alcoholism (HCC)   . Allergic rhinitis   . Allergy   . Anxiety   . Arthritis    rheumatoid  . Asthma   . Back pain, chronic   . Chronic back pain 05/27/2012   Secondary to L4-5 herniated disc per patient  . Chronic headache   . COPD (chronic obstructive pulmonary disease) (HCC)   . Depression   . Diabetes mellitus   . Emphysema   . Emphysema of lung (HCC)   . Generalized anxiety disorder 05/26/2012  . GERD (gastroesophageal reflux disease)   . Grave's disease   . 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 Heart murmur   . Hernia, umbilical   . Herniated disc   . Herniated disc   . Hyperlipidemia   . Hypertension   . Menopause   . Neuropathy (HCC)   . OSA (obstructive sleep apnea)    has a cpap  . Osteoporosis   . Rheumatoid arthritis (HCC)   . Shingles   . Sleep apnea    wear c-pap  . Tear of lateral meniscus of right knee 10/13/2013  . Wears dentures    top  . Wears glasses    Past Surgical History:  Procedure Laterality Date  . CHOLECYSTECTOMY     . EYE SURGERY     eye back in socker-rt  . FOOT SURGERY     cyst rt foot  . KNEE ARTHROSCOPY WITH LATERAL MENISECTOMY Right 10/13/2013   Procedure: RIGHT KNEE ARTHROSCOPY WITH PARTIAL LATERAL MENISECTOMY/DEBRIDEMENT/SHAVING ;  Surgeon: Eulas Post, MD;  Location: [facility];  Service: Orthopedics;  Laterality: Right;  . LAPAROSCOPY     age 23  . LEFT HEART CATHETERIZATION WITH CORONARY ANGIOGRAM N/A 05/30/2012   Procedure: LEFT HEART CATHETERIZATION WITH CORONARY ANGIOGRAM;  Surgeon: [name], MD;  Location: [facility] CATH LAB;  Service: Cardiovascular;  Laterality: N/A;  . TUBAL LIGATION     Family History  Problem Relation Age of Onset  . Emphysema Mother   . Allergies Mother   . Asthma Mother   . Heart disease Mother   . Rheum arthritis Mother   . Diabetes Mother   . Kidney disease Mother   . Allergies Daughter   . Asthma Brother   . Breast cancer Other     aunt  . Lung cancer Brother   . Throat cancer Brother   . Colon cancer Neg Hx   . Colon polyps Neg Hx   . Esophageal cancer Neg Hx   .  Rectal cancer Neg Hx   . Stomach cancer Neg Hx    Social History  Substance Use Topics  . Smoking status: Current Every Day Smoker    Packs/day: 1.00    Years: 40.00    Types: Cigarettes  . Smokeless tobacco: Never Used  . Alcohol use No   OB History    Gravida Para Term Preterm AB Living   5 3     2 3    SAB TAB Ectopic Multiple Live Births   2             Review of Systems  Constitutional: Negative.   HENT: Negative.   Eyes: Negative.   Respiratory: Negative.   Cardiovascular: Negative.   Gastrointestinal: Negative.   Endocrine: Negative.   Genitourinary: Negative.   Musculoskeletal: Positive for back pain.  Allergic/Immunologic: Negative.   Neurological: Negative.   Hematological: Negative.   Psychiatric/Behavioral: Negative.     Allergies  Ciprofloxacin; Fluocinolone; and Penicillins  Home Medications   Prior to Admission medications    Medication Sig Start Date End Date Taking? Authorizing Provider  acyclovir (ZOVIRAX) 800 MG tablet Take 1 tablet (800 mg total) by mouth 5 (five) times daily. 03/10/16 03/20/16  Audry Pili, PA-C  albuterol (PROAIR HFA) 108 (90 BASE) MCG/ACT inhaler Inhale 2 puffs into the lungs 4 (four) times daily. For COPD    Historical Provider, MD  atorvastatin (LIPITOR) 40 MG tablet Take 1 tablet (40 mg total) by mouth daily. 10/06/12   Gust Rung, DO  azithromycin (ZITHROMAX) 250 MG tablet Take 1 tablet (250 mg total) by mouth daily. Take first 2 tablets together, then 1 every day until finished. 01/08/16   Deatra Canter, FNP  BuPROPion HCl (WELLBUTRIN PO) Take 1 tablet by mouth 2 (two) times daily.    Historical Provider, MD  busPIRone (BUSPAR) 10 MG tablet Take 20 mg by mouth 3 (three) times daily.     Historical Provider, MD  cyclobenzaprine (FLEXERIL) 10 MG tablet 1 tablet 3 times a day 04/14/15   Tharon Aquas, PA  cycloSPORINE (RESTASIS) 0.05 % ophthalmic emulsion Place 1 drop into both eyes 2 (two) times daily.    Historical Provider, MD  diazepam (VALIUM) 5 MG tablet Take 1 tablet (5 mg total) by mouth 2 (two) times daily. 08/06/15   Derwood Kaplan, MD  diclofenac sodium (VOLTAREN) 1 % GEL Apply 2 g topically 4 (four) times daily as needed (pain).     Historical Provider, MD  FLUoxetine (PROZAC) 20 MG capsule Take 60 mg by mouth every morning.     Historical Provider, MD  gabapentin (NEURONTIN) 300 MG capsule Take 1 capsule (300 mg total) by mouth 3 (three) times daily. 01/08/16   Deatra Canter, FNP  glipiZIDE (GLUCOTROL) 10 MG tablet Take 10 mg by mouth 2 (two) times daily before a meal.    Historical Provider, MD  HYDROcodone-acetaminophen (NORCO) 10-325 MG tablet Take 1 tablet by mouth every 6 (six) hours as needed. 08/10/15   Charm Rings, MD  HYDROcodone-acetaminophen (NORCO/VICODIN) 5-325 MG tablet Take 1-2 tablets by mouth every 6 (six) hours as needed for severe pain. 12/17/15   Arnaldo Natal, MD  HYDROcodone-homatropine [facility]) 5-1.5 MG/5ML syrup Take 5 mLs by mouth every 6 (six) hours as needed for cough. 08/03/15   Riki Sheer, PA-C  ipratropium-albuterol (DUONEB) 0.5-2.5 (3) MG/3ML SOLN Take 3 mLs by nebulization every 6 (six) hours as needed (Asthma).    Historical Provider, MD  levothyroxine (  SYNTHROID, LEVOTHROID) 175 MCG tablet Take 1 tablet (175 mcg total) by mouth every morning. 03/10/16   Audry Pili, PA-C  lidocaine (LIDODERM) 5 % Place 1 patch onto the skin daily. Remove & Discard patch within 12 hours or as directed by MD 03/10/16   Audry Pili, PA-C  lisinopril (PRINIVIL,ZESTRIL) 10 MG tablet Take 1 tablet (10 mg total) by mouth daily. 03/10/16   Audry Pili, PA-C  loratadine (CLARITIN) 10 MG tablet Take 10 mg by mouth daily as needed for allergies.    Historical Provider, MD  meloxicam (MOBIC) 15 MG tablet Take 15 mg by mouth daily.    Historical Provider, MD  metFORMIN (GLUCOPHAGE) 1000 MG tablet Take 1 tablet (1,000 mg total) by mouth 2 (two) times daily. 03/10/16   Audry Pili, PA-C  methocarbamol (ROBAXIN) 500 MG tablet Take 1 tablet (500 mg total) by mouth 2 (two) times daily. 12/07/14   Fayrene Helper, PA-C  methylPREDNISolone (MEDROL DOSEPAK) 4 MG TBPK tablet Take 6-5-4-3-2-1 po qd 01/08/16   Deatra Canter, FNP  naproxen (NAPROSYN) 500 MG tablet Take 1 tablet (500 mg total) by mouth 2 (two) times daily. 03/10/16   Audry Pili, PA-C  Olopatadine HCl (PATADAY) 0.2 % SOLN Apply 1 drop to eye daily. 12/21/14   Charm Rings, MD  oxyCODONE-acetaminophen (PERCOCET) 10-325 MG tablet Take 1 tablet by mouth 2 (two) times daily as needed for pain. 03/04/16   [name], MD  predniSONE (DELTASONE) 20 MG tablet Take 3 tabs po on first day, 2 tabs second day, 2 tabs third day, 1 tab fourth day, 1 tab 5th day. Take with food. 02/02/16   Hayden Rasmussen, NP  pregabalin (LYRICA) 75 MG capsule Take 1 capsule (75 mg total) by mouth 3 (three) times daily. 12/17/15   Arnaldo Natal, MD   QUEtiapine (SEROQUEL) 50 MG tablet Take 1 tablet (50 mg total) by mouth at bedtime. 03/04/16   [name], MD  sennosides-docusate sodium (SENOKOT-S) 8.6-50 MG tablet Take 2 tablets by mouth daily. Patient taking differently: Take 2 tablets by mouth daily as needed for constipation.  10/13/13   Teryl Lucy, MD  tiotropium (SPIRIVA) 18 MCG inhalation capsule Place 1 capsule (18 mcg total) into inhaler and inhale daily. 12/18/14   Valentino Nose, MD  verapamil (CALAN) 40 MG tablet Take 40 mg by mouth 3 (three) times daily.    Historical Provider, MD   Meds Ordered and Administered this Visit   Medications  ketorolac (TORADOL) injection 60 mg (not administered)    BP 152/77 (BP Location: Left Arm)   Pulse 86   Temp 97.8 F (36.6 C) (Oral)   Resp 14   SpO2 100%  No data found.   Physical Exam  Constitutional: She appears well-developed and well-nourished.  HENT:  Head: Normocephalic and atraumatic.  Right Ear: External ear normal.  Left Ear: External ear normal.  Mouth/Throat: Oropharynx is clear and moist.  Eyes: EOM are normal. Pupils are equal, round, and reactive to light.  Neck: Normal range of motion. Neck supple.  Cardiovascular: Normal rate, regular rhythm and normal heart sounds.   Pulmonary/Chest: Effort normal and breath sounds normal.  Abdominal: Soft.  Musculoskeletal: She exhibits tenderness.  Right lumbar muscle spasm.  Nursing note and vitals reviewed.   Urgent Care Course   Clinical Course     Procedures (including critical care time)  Labs Review Labs Reviewed - No data to display  Imaging Review No results found.   Visual Acuity Review  Right Eye Distance:   Left Eye Distance:   Bilateral Distance:    Right Eye Near:   Left Eye Near:    Bilateral Near:         MDM  Back pain - Toradol 60mg  IM Follow up Dr. on Monday      Thursday, Deatra Canter 03/12/16 1402

## 2016-03-17 ENCOUNTER — Encounter (HOSPITAL_COMMUNITY): Payer: Self-pay | Admitting: Emergency Medicine

## 2016-03-17 DIAGNOSIS — Z7984 Long term (current) use of oral hypoglycemic drugs: Secondary | ICD-10-CM | POA: Diagnosis not present

## 2016-03-17 DIAGNOSIS — J449 Chronic obstructive pulmonary disease, unspecified: Secondary | ICD-10-CM | POA: Diagnosis not present

## 2016-03-17 DIAGNOSIS — Z79899 Other long term (current) drug therapy: Secondary | ICD-10-CM | POA: Insufficient documentation

## 2016-03-17 DIAGNOSIS — F17210 Nicotine dependence, cigarettes, uncomplicated: Secondary | ICD-10-CM | POA: Diagnosis not present

## 2016-03-17 DIAGNOSIS — E039 Hypothyroidism, unspecified: Secondary | ICD-10-CM | POA: Diagnosis not present

## 2016-03-17 DIAGNOSIS — N898 Other specified noninflammatory disorders of vagina: Secondary | ICD-10-CM | POA: Insufficient documentation

## 2016-03-17 DIAGNOSIS — E1140 Type 2 diabetes mellitus with diabetic neuropathy, unspecified: Secondary | ICD-10-CM | POA: Insufficient documentation

## 2016-03-17 NOTE — ED Triage Notes (Signed)
Pt. reports vaginal itchy rashes / skin irritation onset this week . Denies dysuria / no vaginal discharge .

## 2016-03-18 ENCOUNTER — Emergency Department (HOSPITAL_COMMUNITY)
Admission: EM | Admit: 2016-03-18 | Discharge: 2016-03-18 | Disposition: A | Discharge status: Home / Self Care | Payer: Medicaid Other | Attending: Emergency Medicine | Admitting: Emergency Medicine

## 2016-03-18 DIAGNOSIS — L299 Pruritus, unspecified: Secondary | ICD-10-CM

## 2016-03-18 LAB — URINALYSIS, ROUTINE W REFLEX MICROSCOPIC
BILIRUBIN URINE: NEGATIVE
Glucose, UA: NEGATIVE mg/dL
Hgb urine dipstick: NEGATIVE
KETONES UR: NEGATIVE mg/dL
Leukocytes, UA: NEGATIVE
NITRITE: NEGATIVE
PROTEIN: NEGATIVE mg/dL
SPECIFIC GRAVITY, URINE: 1.01 (ref 1.005–1.030)
pH: 5 (ref 5.0–8.0)

## 2016-03-18 LAB — GC/CHLAMYDIA PROBE AMP (~~LOC~~) NOT AT ARMC
Chlamydia: NEGATIVE
NEISSERIA GONORRHEA: NEGATIVE

## 2016-03-18 LAB — WET PREP, GENITAL
SPERM: NONE SEEN
Trich, Wet Prep: NONE SEEN
Yeast Wet Prep HPF POC: NONE SEEN

## 2016-03-18 LAB — CBG MONITORING, ED: Glucose-Capillary: 163 mg/dL — ABNORMAL HIGH (ref 65–99)

## 2016-03-18 MED ORDER — HYDROXYZINE HCL 25 MG PO TABS: 25.0000 mg | ORAL_TABLET | Freq: Four times a day (QID) | ORAL | 0 refills | Status: DC

## 2016-03-18 MED ORDER — HYDROXYZINE HCL 25 MG PO TABS
25.0000 mg | ORAL_TABLET | Freq: Once | ORAL | Status: AC
  Administered 2016-03-18: 25 mg via ORAL
  Filled 2016-03-18: qty 1

## 2016-03-18 NOTE — ED Notes (Signed)
339 ml noted on bladder scanner

## 2016-03-18 NOTE — ED Notes (Signed)
Pt verbalized understanding of d/c instructions and has no further questions. Pt is stable, A&Ox4, VSS.  

## 2016-03-18 NOTE — ED Provider Notes (Signed)
[facility]-EMERGENCY DEPT Provider Note   CSN: [id number] Arrival date & time: 03/17/16  2233     History   Chief Complaint Chief Complaint  Patient presents with  . Vaginal Itching    Rash    HPI Vanessa [name] is a 59 y.o. female with hx of diabetes, chronic pain, HTN, Graves disease, substance abuse and multiple other chronic health problems who presents to the ED with vaginal itching that started 2 days ago after starting Suboxone 2 days ago. Patient reports taking Benadryl for itching. She states that tonight she could not sleepy for feeling the itching in her lower extremities as well as the vaginal, rectal area. She was afraid she was having an allergic reaction to the medication. Patient denies difficulty swallowing, sore throat or feeling of swelling of throat. She also denies shortness of breath or any respiratory symptoms.  She also reports having the urge to void but when she goes she can only do a few drops.   HPI  Past Medical History:  Diagnosis Date  . Alcoholism (HCC)   . Allergic rhinitis   . Allergy   . Anxiety   . Arthritis    rheumatoid  . Asthma   . Back pain, chronic   . Chronic back pain 05/27/2012   Secondary to L4-5 herniated disc per patient  . Chronic headache   . COPD (chronic obstructive pulmonary disease) (HCC)   . Depression   . Diabetes mellitus   . Emphysema   . Emphysema of lung (HCC)   . Generalized anxiety disorder 05/26/2012  . GERD (gastroesophageal reflux disease)   . Grave's disease   . 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 Heart murmur   . Hernia, umbilical   . Herniated disc   . Herniated disc   . Hyperlipidemia   . Hypertension   . Menopause   . Neuropathy (HCC)   . OSA (obstructive sleep apnea)    has a cpap  . Osteoporosis   . Rheumatoid arthritis (HCC)   . Shingles   . Sleep apnea    wear c-pap  . Tear of lateral meniscus of right knee 10/13/2013  .  Wears dentures    top  . Wears glasses     Patient Active Problem List   Diagnosis Date Noted  . Atypical pneumonia 12/18/2014  . Essential hypertension 12/17/2014  . COPD exacerbation (HCC) 12/16/2014  . Tear of lateral meniscus of right knee 10/13/2013  . Chest pain 07/23/2013  . GERD (gastroesophageal reflux disease) 07/23/2013  . Morbid obesity (HCC) 07/23/2013  . Renal failure (ARF), acute on chronic (HCC) 07/23/2013  . HLD (hyperlipidemia) 07/23/2013  . Dysarthria 10/05/2012  . Diarrhea 05/27/2012  . Chronic back pain 05/27/2012  . Diabetes mellitus (HCC) 05/26/2012  . Hypothyroidism 05/26/2012  . 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 Depression 05/26/2012  . Generalized anxiety disorder 05/26/2012  . OSA (obstructive sleep apnea) 03/18/2011    Past Surgical History:  Procedure Laterality Date  . CHOLECYSTECTOMY    . EYE SURGERY     eye back in socker-rt  . FOOT SURGERY     cyst rt foot  . KNEE ARTHROSCOPY WITH LATERAL MENISECTOMY Right 10/13/2013   Procedure: RIGHT KNEE ARTHROSCOPY WITH PARTIAL LATERAL MENISECTOMY/DEBRIDEMENT/SHAVING ;  Surgeon: Eulas Post, MD;  Location: [facility];  Service: Orthopedics;  Laterality: Right;  . LAPAROSCOPY     age 26  . LEFT  HEART CATHETERIZATION WITH CORONARY ANGIOGRAM N/A 05/30/2012   Procedure: LEFT HEART CATHETERIZATION WITH CORONARY ANGIOGRAM;  Surgeon: [name], MD;  Location: [facility] CATH LAB;  Service: Cardiovascular;  Laterality: N/A;  . TUBAL LIGATION      OB History    Gravida Para Term Preterm AB Living   5 3     2 3    SAB TAB Ectopic Multiple Live Births   2               Home Medications    Prior to Admission medications   Medication Sig Start Date End Date Taking? Authorizing Provider  acyclovir (ZOVIRAX) 800 MG tablet Take 1 tablet (800 mg total) by mouth 5 (five) times daily. 03/10/16 03/20/16  03/22/16, PA-C  albuterol (PROAIR HFA) 108 (90 BASE) MCG/ACT inhaler Inhale  2 puffs into the lungs 4 (four) times daily. For COPD    Historical Provider, MD  atorvastatin (LIPITOR) 40 MG tablet Take 1 tablet (40 mg total) by mouth daily. 10/06/12   12/07/12, DO  azithromycin (ZITHROMAX) 250 MG tablet Take 1 tablet (250 mg total) by mouth daily. Take first 2 tablets together, then 1 every day until finished. 01/08/16   03/09/16, FNP  BuPROPion HCl (WELLBUTRIN PO) Take 1 tablet by mouth 2 (two) times daily.    Historical Provider, MD  busPIRone (BUSPAR) 10 MG tablet Take 20 mg by mouth 3 (three) times daily.     Historical Provider, MD  cyclobenzaprine (FLEXERIL) 10 MG tablet 1 tablet 3 times a day 04/14/15   04/16/15, PA  cycloSPORINE (RESTASIS) 0.05 % ophthalmic emulsion Place 1 drop into both eyes 2 (two) times daily.    Historical Provider, MD  diazepam (VALIUM) 5 MG tablet Take 1 tablet (5 mg total) by mouth 2 (two) times daily. 08/06/15   10/06/15, MD  diclofenac sodium (VOLTAREN) 1 % GEL Apply 2 g topically 4 (four) times daily as needed (pain).     Historical Provider, MD  FLUoxetine (PROZAC) 20 MG capsule Take 60 mg by mouth every morning.     Historical Provider, MD  gabapentin (NEURONTIN) 300 MG capsule Take 1 capsule (300 mg total) by mouth 3 (three) times daily. 01/08/16   03/09/16, FNP  glipiZIDE (GLUCOTROL) 10 MG tablet Take 10 mg by mouth 2 (two) times daily before a meal.    Historical Provider, MD  HYDROcodone-acetaminophen (NORCO) 10-325 MG tablet Take 1 tablet by mouth every 6 (six) hours as needed. 08/10/15   08/12/15, MD  HYDROcodone-acetaminophen (NORCO/VICODIN) 5-325 MG tablet Take 1-2 tablets by mouth every 6 (six) hours as needed for severe pain. 12/17/15   12/19/15, MD  HYDROcodone-homatropine [facility]) 5-1.5 MG/5ML syrup Take 5 mLs by mouth every 6 (six) hours as needed for cough. 08/03/15   10/03/15, PA-C  ipratropium-albuterol (DUONEB) 0.5-2.5 (3) MG/3ML SOLN Take 3 mLs by nebulization every 6 (six)  hours as needed (Asthma).    Historical Provider, MD  levothyroxine (SYNTHROID, LEVOTHROID) 175 MCG tablet Take 1 tablet (175 mcg total) by mouth every morning. 03/10/16   14/12/17, PA-C  lidocaine (LIDODERM) 5 % Place 1 patch onto the skin daily. Remove & Discard patch within 12 hours or as directed by MD 03/10/16   14/12/17, PA-C  lisinopril (PRINIVIL,ZESTRIL) 10 MG tablet Take 1 tablet (10 mg total) by mouth daily. 03/10/16   14/12/17, PA-C  loratadine (CLARITIN) 10 MG tablet  Take 10 mg by mouth daily as needed for allergies.    Historical Provider, MD  meloxicam (MOBIC) 15 MG tablet Take 15 mg by mouth daily.    Historical Provider, MD  metFORMIN (GLUCOPHAGE) 1000 MG tablet Take 1 tablet (1,000 mg total) by mouth 2 (two) times daily. 03/10/16   Audry Pili, PA-C  methocarbamol (ROBAXIN) 500 MG tablet Take 1 tablet (500 mg total) by mouth 2 (two) times daily. 12/07/14   Fayrene Helper, PA-C  methylPREDNISolone (MEDROL DOSEPAK) 4 MG TBPK tablet Take 6-5-4-3-2-1 po qd 01/08/16   Deatra Canter, FNP  naproxen (NAPROSYN) 500 MG tablet Take 1 tablet (500 mg total) by mouth 2 (two) times daily. 03/10/16   Audry Pili, PA-C  Olopatadine HCl (PATADAY) 0.2 % SOLN Apply 1 drop to eye daily. 12/21/14   Charm Rings, MD  oxyCODONE-acetaminophen (PERCOCET) 10-325 MG tablet Take 1 tablet by mouth 2 (two) times daily as needed for pain. 03/04/16   [name], MD  predniSONE (DELTASONE) 20 MG tablet Take 3 tabs po on first day, 2 tabs second day, 2 tabs third day, 1 tab fourth day, 1 tab 5th day. Take with food. 02/02/16   Hayden Rasmussen, NP  pregabalin (LYRICA) 75 MG capsule Take 1 capsule (75 mg total) by mouth 3 (three) times daily. 12/17/15   Arnaldo Natal, MD  QUEtiapine (SEROQUEL) 50 MG tablet Take 1 tablet (50 mg total) by mouth at bedtime. 03/04/16   [name], MD  sennosides-docusate sodium (SENOKOT-S) 8.6-50 MG tablet Take 2 tablets by mouth daily. Patient taking differently: Take 2 tablets by mouth daily  as needed for constipation.  10/13/13   Teryl Lucy, MD  tiotropium (SPIRIVA) 18 MCG inhalation capsule Place 1 capsule (18 mcg total) into inhaler and inhale daily. 12/18/14   Valentino Nose, MD  verapamil (CALAN) 40 MG tablet Take 40 mg by mouth 3 (three) times daily.    Historical Provider, MD    Family History Family History  Problem Relation Age of Onset  . Emphysema Mother   . Allergies Mother   . Asthma Mother   . Heart disease Mother   . Rheum arthritis Mother   . Diabetes Mother   . Kidney disease Mother   . Allergies Daughter   . Asthma Brother   . Breast cancer Other     aunt  . Lung cancer Brother   . Throat cancer Brother   . Colon cancer Neg Hx   . Colon polyps Neg Hx   . Esophageal cancer Neg Hx   . Rectal cancer Neg Hx   . Stomach cancer Neg Hx     Social History Social History  Substance Use Topics  . Smoking status: Current Every Day Smoker    Packs/day: 1.00    Years: 40.00    Types: Cigarettes  . Smokeless tobacco: Never Used  . Alcohol use No     Allergies   Ciprofloxacin; Fluocinolone; and Penicillins   Review of Systems Review of Systems  Gastrointestinal: Positive for nausea.  Genitourinary: Positive for urgency, vaginal discharge and vaginal pain.  Skin:       itching  All other systems reviewed and are negative.    Physical Exam Updated Vital Signs BP 119/74 (BP Location: Left Arm)   Pulse 92   Temp 100.3 F (37.9 C) (Oral)   Resp 16   Ht 5\' 7"  (1.702 m)   Wt 96.6 kg   SpO2 96%   BMI 33.36 kg/m  Physical Exam  Constitutional: She is oriented to person, place, and time. She appears well-developed and well-nourished. No distress.  HENT:  Head: Normocephalic.  No difficulty swallowing.  Eyes: EOM are normal.  Neck: Neck supple.  Cardiovascular: Normal rate.   Pulmonary/Chest: Effort normal. No respiratory distress.  Abdominal: Soft. There is no tenderness.  Genitourinary:  Genitourinary Comments: External genitalia  without lesions, white d/c vaginal vault. No CMT, no adnexal tenderness, uterus without palpable enlargement.   Musculoskeletal: Normal range of motion.  Neurological: She is alert and oriented to person, place, and time. No cranial nerve deficit.  Skin: Skin is warm and dry.  Dry scabbed over areas left lower back s/p Zoster recently. No other rash noted, no hives.  Psychiatric: She has a normal mood and affect. Her behavior is normal.  Nursing note and vitals reviewed.    ED Treatments / Results  Labs (all labs ordered are listed, but only abnormal results are displayed) Labs Reviewed  WET PREP, GENITAL - Abnormal; Notable for the following:       Result Value   Clue Cells Wet Prep HPF POC PRESENT (*)    WBC, Wet Prep HPF POC FEW (*)    All other components within normal limits  CBG MONITORING, ED - Abnormal; Notable for the following:    Glucose-Capillary 163 (*)    All other components within normal limits  URINE CULTURE  URINALYSIS, ROUTINE W REFLEX MICROSCOPIC  GC/CHLAMYDIA PROBE AMP ([place]) NOT AT [facility]    Radiology No results found.  Procedures Procedures (including critical care time)  Medications Ordered in ED Medications  hydrOXYzine (ATARAX/VISTARIL) tablet 25 mg (25 mg Oral Given 03/18/16 0158)     Initial Impression / Assessment and Plan / ED Course  I have reviewed the triage vital signs and the nursing notes.  Pertinent labs & imaging results that were available during my care of the patient were reviewed by me and considered in my medical decision making (see chart for details).  Clinical Course    Care turned over to [facility], [facility] @ [phone]. Labs pending.   [address] [place], [place] 03/18/16 7824    Dione Booze, MD 03/18/16 [phone]

## 2016-03-18 NOTE — ED Provider Notes (Signed)
Patient care signed out at end of shift by Kerrie Buffalo, NP, with UA pending. She presented with symptoms that began as vaginal/groin itching immediately following starting Suboxone yesterday. When she took her pill tonight she reports the itching spread to the torso. No SOB, facial swelling or difficulty swallowing.   UA negative. Patient can be discharged home per plan of previous treatment team.    Elpidio Anis, PA-C 03/18/16 0316    Dione Booze, MD 03/18/16 [phone]

## 2016-03-19 LAB — URINE CULTURE: Culture: >=100000 — AB

## 2016-03-20 ENCOUNTER — Telehealth: Payer: Self-pay

## 2016-03-20 NOTE — Progress Notes (Signed)
ED Antimicrobial Stewardship Positive Culture Follow Up   Vanessa [name] is an 60 y.o. female who presented to [facility] on 03/18/2016 with a chief complaint of  Chief Complaint  Patient presents with  . Vaginal Itching    Rash    Recent Results (from the past 720 hour(s))  Wet prep, genital     Status: Abnormal   Collection Time: 03/18/16  1:12 AM  Result Value Ref Range Status   Yeast Wet Prep HPF POC NONE SEEN NONE SEEN Final   Trich, Wet Prep NONE SEEN NONE SEEN Final   Clue Cells Wet Prep HPF POC PRESENT (A) NONE SEEN Final   WBC, Wet Prep HPF POC FEW (A) NONE SEEN Final   Sperm NONE SEEN  Final  Urine culture     Status: Abnormal   Collection Time: 03/18/16  2:33 AM  Result Value Ref Range Status   Specimen Description URINE, RANDOM  Final   Special Requests NONE  Final   Culture >=100,000 COLONIES/mL LACTOBACILLUS SPECIES (A)  Final   Report Status 03/19/2016 FINAL  Final   No abx likely colonizer  ED Provider: Fayrene Helper, PA-C  Bertram Millard 03/20/2016, 8:20 AM Infectious Diseases Pharmacist Phone# [phone]

## 2016-03-20 NOTE — Telephone Encounter (Signed)
No treatment needed for UC per Fayrene Helper PA-C

## 2016-04-13 ENCOUNTER — Emergency Department (HOSPITAL_COMMUNITY): Payer: Medicaid Other

## 2016-04-13 ENCOUNTER — Encounter (HOSPITAL_COMMUNITY): Payer: Self-pay | Admitting: Emergency Medicine

## 2016-04-13 ENCOUNTER — Emergency Department (HOSPITAL_COMMUNITY)
Admission: EM | Admit: 2016-04-13 | Discharge: 2016-04-14 | Disposition: A | Discharge status: Home / Self Care | Payer: Medicaid Other | Attending: Emergency Medicine | Admitting: Emergency Medicine

## 2016-04-13 DIAGNOSIS — R1013 Epigastric pain: Secondary | ICD-10-CM | POA: Diagnosis present

## 2016-04-13 DIAGNOSIS — I10 Essential (primary) hypertension: Secondary | ICD-10-CM | POA: Insufficient documentation

## 2016-04-13 DIAGNOSIS — K2960 Other gastritis without bleeding: Secondary | ICD-10-CM | POA: Diagnosis not present

## 2016-04-13 DIAGNOSIS — F17210 Nicotine dependence, cigarettes, uncomplicated: Secondary | ICD-10-CM | POA: Insufficient documentation

## 2016-04-13 DIAGNOSIS — J069 Acute upper respiratory infection, unspecified: Secondary | ICD-10-CM | POA: Diagnosis not present

## 2016-04-13 DIAGNOSIS — E039 Hypothyroidism, unspecified: Secondary | ICD-10-CM | POA: Insufficient documentation

## 2016-04-13 DIAGNOSIS — J449 Chronic obstructive pulmonary disease, unspecified: Secondary | ICD-10-CM | POA: Insufficient documentation

## 2016-04-13 DIAGNOSIS — K2900 Acute gastritis without bleeding: Secondary | ICD-10-CM

## 2016-04-13 DIAGNOSIS — Z79899 Other long term (current) drug therapy: Secondary | ICD-10-CM | POA: Diagnosis not present

## 2016-04-13 DIAGNOSIS — E1140 Type 2 diabetes mellitus with diabetic neuropathy, unspecified: Secondary | ICD-10-CM | POA: Insufficient documentation

## 2016-04-13 DIAGNOSIS — Z7984 Long term (current) use of oral hypoglycemic drugs: Secondary | ICD-10-CM | POA: Insufficient documentation

## 2016-04-13 LAB — BASIC METABOLIC PANEL
ANION GAP: 9 (ref 5–15)
BUN: 7 mg/dL (ref 6–20)
CHLORIDE: 102 mmol/L (ref 101–111)
CO2: 25 mmol/L (ref 22–32)
Calcium: 8.9 mg/dL (ref 8.9–10.3)
Creatinine, Ser: 1.04 mg/dL — ABNORMAL HIGH (ref 0.44–1.00)
GFR calc non Af Amer: 58 mL/min — ABNORMAL LOW (ref >60)
GLUCOSE: 106 mg/dL — AB (ref 65–99)
POTASSIUM: 3.9 mmol/L (ref 3.5–5.1)
Sodium: 136 mmol/L (ref 135–145)

## 2016-04-13 LAB — LIPASE, BLOOD
Lipase: 38 U/L (ref 11–51)
Lipase: 50 U/L (ref 11–51)

## 2016-04-13 LAB — HEPATIC FUNCTION PANEL
ALBUMIN: 3.6 g/dL (ref 3.5–5.0)
ALK PHOS: 51 U/L (ref 38–126)
ALT: 11 U/L — ABNORMAL LOW (ref 14–54)
AST: 15 U/L (ref 15–41)
BILIRUBIN TOTAL: 0.1 mg/dL — AB (ref 0.3–1.2)
Bilirubin, Direct: <0.1 mg/dL — ABNORMAL LOW (ref 0.1–0.5)
TOTAL PROTEIN: 6.1 g/dL — AB (ref 6.5–8.1)

## 2016-04-13 LAB — CBC
HEMATOCRIT: 35.2 % — AB (ref 36.0–46.0)
HEMOGLOBIN: 11.8 g/dL — AB (ref 12.0–15.0)
MCH: 29 pg (ref 26.0–34.0)
MCHC: 33.5 g/dL (ref 30.0–36.0)
MCV: 86.5 fL (ref 78.0–100.0)
Platelets: 210 10*3/uL (ref 150–400)
RBC: 4.07 MIL/uL (ref 3.87–5.11)
RDW: 13.9 % (ref 11.5–15.5)
WBC: 11.2 10*3/uL — ABNORMAL HIGH (ref 4.0–10.5)

## 2016-04-13 LAB — I-STAT TROPONIN, ED: Troponin i, poc: 0 ng/mL (ref 0.00–0.08)

## 2016-04-13 MED ORDER — GI COCKTAIL ~~LOC~~
30.0000 mL | Freq: Once | ORAL | Status: AC
  Administered 2016-04-13: 30 mL via ORAL
  Filled 2016-04-13: qty 30

## 2016-04-13 MED ORDER — HYDROCODONE-ACETAMINOPHEN 5-325 MG PO TABS
2.0000 | ORAL_TABLET | Freq: Once | ORAL | Status: AC
  Administered 2016-04-13: 2 via ORAL
  Filled 2016-04-13: qty 2

## 2016-04-13 NOTE — ED Provider Notes (Signed)
[facility]-EMERGENCY DEPT Provider Note   CSN: [id number] Arrival date & time: 04/13/16  0623     History   Chief Complaint Chief Complaint  Patient presents with  . Abdominal Pain  . Chest Pain    HPI Vanessa [name] is a 61 y.o. female.  HPI   61 yo F with PMHx as below here with mild epigastric and chest pain. Pt states that for the past 1 week she has had cough, productive sputum, and subjective fevers. She has been taking ibuprofen, up to 1600 mg, for her pain along with aspirin and goody's powders. She has then developed an aching, burning epigastric pain over the past several days that started in her epigastric area and now radiates into her chest. Pain is burning, sharp. No alleviating factors. Worsens with eating.  Past Medical History:  Diagnosis Date  . Alcoholism (HCC)   . Allergic rhinitis   . Allergy   . Anxiety   . Arthritis    rheumatoid  . Asthma   . Back pain, chronic   . Chronic back pain 05/27/2012   Secondary to L4-5 herniated disc per patient  . Chronic headache   . COPD (chronic obstructive pulmonary disease) (HCC)   . Depression   . Diabetes mellitus   . Emphysema   . Emphysema of lung (HCC)   . Generalized anxiety disorder 05/26/2012  . GERD (gastroesophageal reflux disease)   . Grave's disease   . 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 Heart murmur   . Hernia, umbilical   . Herniated disc   . Herniated disc   . Hyperlipidemia   . Hypertension   . Menopause   . Neuropathy (HCC)   . OSA (obstructive sleep apnea)    has a cpap  . Osteoporosis   . Rheumatoid arthritis (HCC)   . Shingles   . Sleep apnea    wear c-pap  . Tear of lateral meniscus of right knee 10/13/2013  . Wears dentures    top  . Wears glasses     Patient Active Problem List   Diagnosis Date Noted  . Atypical pneumonia 12/18/2014  . Essential hypertension 12/17/2014  . COPD exacerbation (HCC) 12/16/2014    . Tear of lateral meniscus of right knee 10/13/2013  . Chest pain 07/23/2013  . GERD (gastroesophageal reflux disease) 07/23/2013  . Morbid obesity (HCC) 07/23/2013  . Renal failure (ARF), acute on chronic (HCC) 07/23/2013  . HLD (hyperlipidemia) 07/23/2013  . Dysarthria 10/05/2012  . Diarrhea 05/27/2012  . Chronic back pain 05/27/2012  . Diabetes mellitus (HCC) 05/26/2012  . Hypothyroidism 05/26/2012  . 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 Depression 05/26/2012  . Generalized anxiety disorder 05/26/2012  . OSA (obstructive sleep apnea) 03/18/2011    Past Surgical History:  Procedure Laterality Date  . CHOLECYSTECTOMY    . EYE SURGERY     eye back in socker-rt  . FOOT SURGERY     cyst rt foot  . KNEE ARTHROSCOPY WITH LATERAL MENISECTOMY Right 10/13/2013   Procedure: RIGHT KNEE ARTHROSCOPY WITH PARTIAL LATERAL MENISECTOMY/DEBRIDEMENT/SHAVING ;  Surgeon: Eulas Post, MD;  Location: [facility];  Service: Orthopedics;  Laterality: Right;  . LAPAROSCOPY     age 64  . LEFT HEART CATHETERIZATION WITH CORONARY ANGIOGRAM N/A 05/30/2012   Procedure: LEFT HEART CATHETERIZATION WITH CORONARY ANGIOGRAM;  Surgeon: [name], MD;  Location: [facility] CATH LAB;  Service: Cardiovascular;  Laterality:  N/A;  . TUBAL LIGATION      OB History    Gravida Para Term Preterm AB Living   5 3     2 3    SAB TAB Ectopic Multiple Live Births   2               Home Medications    Prior to Admission medications   Medication Sig Start Date End Date Taking? Authorizing Provider  albuterol (PROAIR HFA) 108 (90 BASE) MCG/ACT inhaler Inhale 2 puffs into the lungs 4 (four) times daily. For COPD   Yes Historical Provider, MD  atorvastatin (LIPITOR) 40 MG tablet Take 1 tablet (40 mg total) by mouth daily. 10/06/12  Yes Gust Rung, DO  busPIRone (BUSPAR) 10 MG tablet Take 20 mg by mouth 2 (two) times daily.    Yes Historical Provider, MD  cycloSPORINE (RESTASIS) 0.05 %  ophthalmic emulsion Place 1 drop into both eyes 2 (two) times daily.   Yes Historical Provider, MD  diclofenac sodium (VOLTAREN) 1 % GEL Apply 2 g topically 4 (four) times daily as needed (pain).    Yes Historical Provider, MD  FLUoxetine (PROZAC) 20 MG capsule Take 60 mg by mouth every morning.    Yes Historical Provider, MD  glipiZIDE (GLUCOTROL) 10 MG tablet Take 10 mg by mouth daily before breakfast.    Yes Historical Provider, MD  ipratropium-albuterol (DUONEB) 0.5-2.5 (3) MG/3ML SOLN Take 3 mLs by nebulization every 6 (six) hours as needed (Asthma).   Yes Historical Provider, MD  levothyroxine (SYNTHROID, LEVOTHROID) 175 MCG tablet Take 1 tablet (175 mcg total) by mouth every morning. 03/10/16  Yes Audry Pili, PA-C  lisinopril (PRINIVIL,ZESTRIL) 10 MG tablet Take 1 tablet (10 mg total) by mouth daily. 03/10/16  Yes Audry Pili, PA-C  loratadine (CLARITIN) 10 MG tablet Take 10 mg by mouth daily.    Yes Historical Provider, MD  meloxicam (MOBIC) 15 MG tablet Take 15 mg by mouth daily.   Yes Historical Provider, MD  metFORMIN (GLUCOPHAGE) 1000 MG tablet Take 1 tablet (1,000 mg total) by mouth 2 (two) times daily. Patient taking differently: Take 1,000 mg by mouth daily with breakfast.  03/10/16  Yes Audry Pili, PA-C  mirtazapine (REMERON) 45 MG tablet Take 45 mg by mouth at bedtime.   Yes Historical Provider, MD  sennosides-docusate sodium (SENOKOT-S) 8.6-50 MG tablet Take 2 tablets by mouth daily. Patient taking differently: Take 1 tablet by mouth 4 (four) times daily.  10/13/13  Yes Teryl Lucy, MD  tiotropium (SPIRIVA) 18 MCG inhalation capsule Place 1 capsule (18 mcg total) into inhaler and inhale daily. 12/18/14  Yes Valentino Nose, MD  verapamil (CALAN) 40 MG tablet Take 40 mg by mouth 3 (three) times daily.   Yes Historical Provider, MD  azithromycin (ZITHROMAX) 250 MG tablet Take 1 tablet (250 mg total) by mouth daily. Take first 2 tablets together, then 1 every day until finished. 04/14/16    Shaune Pollack, MD  omeprazole (PRILOSEC) 20 MG capsule Take 1 capsule (20 mg total) by mouth daily. 04/14/16 04/24/16  Shaune Pollack, MD  promethazine-dextromethorphan (PROMETHAZINE-DM) 6.25-15 MG/5ML syrup Take 5 mLs by mouth 4 (four) times daily as needed for cough. 04/14/16   Shaune Pollack, MD  sucralfate (CARAFATE) 1 GM/10ML suspension Take 10 mLs (1 g total) by mouth 4 (four) times daily -  with meals and at bedtime. 04/14/16   Shaune Pollack, MD    Family History Family History  Problem Relation Age of Onset  .  Emphysema Mother   . Allergies Mother   . Asthma Mother   . Heart disease Mother   . Rheum arthritis Mother   . Diabetes Mother   . Kidney disease Mother   . Allergies Daughter   . Asthma Brother   . Breast cancer Other     aunt  . Lung cancer Brother   . Throat cancer Brother   . Colon cancer Neg Hx   . Colon polyps Neg Hx   . Esophageal cancer Neg Hx   . Rectal cancer Neg Hx   . Stomach cancer Neg Hx     Social History Social History  Substance Use Topics  . Smoking status: Current Every Day Smoker    Packs/day: 1.00    Years: 40.00    Types: Cigarettes  . Smokeless tobacco: Never Used  . Alcohol use No     Allergies   Fluocinolone; Suboxone [buprenorphine hcl-naloxone hcl]; Ciprofloxacin; and Penicillins   Review of Systems Review of Systems  Constitutional: Positive for fatigue. Negative for chills, diaphoresis and fever.  HENT: Negative for congestion and rhinorrhea.   Eyes: Negative for visual disturbance.  Respiratory: Negative for cough, shortness of breath and wheezing.   Cardiovascular: Positive for chest pain. Negative for leg swelling.  Gastrointestinal: Positive for abdominal pain. Negative for diarrhea and vomiting.  Genitourinary: Negative for dysuria and flank pain.  Musculoskeletal: Negative for neck pain and neck stiffness.  Skin: Negative for rash and wound.  Allergic/Immunologic: Negative for immunocompromised state.    Neurological: Negative for syncope, weakness and headaches.  All other systems reviewed and are negative.    Physical Exam Updated Vital Signs BP 130/83   Pulse 72   Temp 99.4 F (37.4 C) (Oral)   Resp 11   Ht 5\' 9"  (1.753 m)   Wt 208 lb (94.3 kg)   SpO2 99%   BMI 30.72 kg/m   Physical Exam  Constitutional: She is oriented to person, place, and time. She appears well-developed and well-nourished. No distress.  HENT:  Head: Normocephalic and atraumatic.  Eyes: Conjunctivae are normal.  Neck: Neck supple.  Cardiovascular: Normal rate, regular rhythm and normal heart sounds.  Exam reveals no friction rub.   No murmur heard. Pulmonary/Chest: Effort normal and breath sounds normal. No respiratory distress. She has no wheezes. She has no rales.  Abdominal: Soft. Bowel sounds are normal. She exhibits no distension. There is tenderness (moderate, epigastric).  Musculoskeletal: She exhibits no edema.  Neurological: She is alert and oriented to person, place, and time. She exhibits normal muscle tone.  Skin: Skin is warm. Capillary refill takes less than 2 seconds.  Psychiatric: She has a normal mood and affect.  Nursing note and vitals reviewed.    ED Treatments / Results  Labs (all labs ordered are listed, but only abnormal results are displayed) Labs Reviewed  BASIC METABOLIC PANEL - Abnormal; Notable for the following:       Result Value   Glucose, Bld 106 (*)    Creatinine, Ser 1.04 (*)    GFR calc non Af Amer 58 (*)    All other components within normal limits  CBC - Abnormal; Notable for the following:    WBC 11.2 (*)    Hemoglobin 11.8 (*)    HCT 35.2 (*)    All other components within normal limits  HEPATIC FUNCTION PANEL - Abnormal; Notable for the following:    Total Protein 6.1 (*)    ALT 11 (*)  Total Bilirubin 0.1 (*)    Bilirubin, Direct <0.1 (*)    All other components within normal limits  LIPASE, BLOOD  LIPASE, BLOOD  I-STAT TROPOININ, ED   I-STAT TROPOININ, ED    EKG  EKG Interpretation  Date/Time:  Monday April 13 2016 18:46:17 EST Ventricular Rate:  104 PR Interval:  148 QRS Duration: 78 QT Interval:  334 QTC Calculation: 439 R Axis:   35 Text Interpretation:  Sinus tachycardia Nonspecific T wave abnormality Abnormal ECG No significant change since last tracing Confirmed by Norman Bier MD, Sheria Lang [phone]) on 04/14/2016 12:34:57 AM Also confirmed by Erma Heritage MD, Averey Trompeter [phone]), editor [place]  [facility], [place] ([id number])  on 04/14/2016 7:15:08 AM       Radiology Dg Chest 2 View  Result Date: 04/13/2016 CLINICAL DATA:  Chest pain EXAM: CHEST  2 VIEW COMPARISON:  11/06/2015 FINDINGS: The heart size and mediastinal contours are within normal limits. Both lungs are clear. The visualized skeletal structures are unremarkable. Surgical clips in the upper abdomen. IMPRESSION: No active cardiopulmonary disease. Electronically Signed   By: Jasmine Pang M.D.   On: 04/13/2016 20:17    Procedures Procedures (including critical care time)  Medications Ordered in ED Medications  HYDROcodone-acetaminophen (NORCO/VICODIN) 5-325 MG per tablet 2 tablet (2 tablets Oral Given 04/13/16 2124)  gi cocktail (Maalox,Lidocaine,Donnatal) (30 mLs Oral Given 04/13/16 2125)     Initial Impression / Assessment and Plan / ED Course  I have reviewed the triage vital signs and the nursing notes.  Pertinent labs & imaging results that were available during my care of the patient were reviewed by me and considered in my medical decision making (see chart for details).  Clinical Course     61 yo F who p/w mild burning chest pain/epigastric pain, cough, and subjective fevers. Suspect that her cough/fevers are 2/2 viral URI versus early bronchitis, and pt has harsh, productive cough without wheezing. CXR shows no PNA. She is satting well. WBC normal and she has no signs of sepsis. She does have h/o smoking so will trial albuterol (she has this at home) and  a Z-pack. Regarding her CP, suspect this is multifactorial, with component of chest wall pain 2/2 coughing as well as possible gastritis/GERD 2/2 heavy NSAID use. Her Hgb is normal, CXR without free air, and I see no evidence of active bleed or perforation. Sx improved with GI cocktail c/w GI etiology. Her EKG is unremarkable, delta trop is negative, and I do nto suspect ACS. She has no leg swelling, hypoxia, and pain is not pleuritic and is reproduced on palpation of abdomen - do not suspect PE. No evidence of chole, pancreatitis on lab work. Will treat this with PPI, carafate, stop NSAIDs. Will refer for PCP f/u for her chronic complaints.  Final Clinical Impressions(s) / ED Diagnoses   Final diagnoses:  Other acute gastritis without hemorrhage  Upper respiratory tract infection, unspecified type    New Prescriptions Discharge Medication List as of 04/14/2016 12:38 AM    START taking these medications   Details  promethazine-dextromethorphan (PROMETHAZINE-DM) 6.25-15 MG/5ML syrup Take 5 mLs by mouth 4 (four) times daily as needed for cough., Starting Tue 04/14/2016, Print         Shaune Pollack, MD 04/14/16 1151

## 2016-04-13 NOTE — ED Triage Notes (Signed)
abd pain and cp  X 3 days  Has been taking motrin and she had some sob and was constipated, last time vomited was 3 days ago

## 2016-04-14 LAB — I-STAT TROPONIN, ED: TROPONIN I, POC: 0 ng/mL (ref 0.00–0.08)

## 2016-04-14 MED ORDER — PROMETHAZINE-DM 6.25-15 MG/5ML PO SYRP: 5.0000 mL | ORAL_SOLUTION | Freq: Four times a day (QID) | ORAL | 0 refills | Status: DC | PRN

## 2016-04-14 MED ORDER — SUCRALFATE 1 GM/10ML PO SUSP: 1.0000 g | Freq: Three times a day (TID) | ORAL | 0 refills | Status: DC

## 2016-04-14 MED ORDER — OMEPRAZOLE 20 MG PO CPDR: 20.0000 mg | DELAYED_RELEASE_CAPSULE | Freq: Every day | ORAL | 0 refills | Status: DC

## 2016-04-14 MED ORDER — AZITHROMYCIN 250 MG PO TABS: 250.0000 mg | ORAL_TABLET | Freq: Every day | ORAL | 0 refills | Status: DC

## 2016-04-14 MED ORDER — PROMETHAZINE-CODEINE 6.25-10 MG/5ML PO SYRP: 5.0000 mL | ORAL_SOLUTION | Freq: Four times a day (QID) | ORAL | 0 refills | Status: DC | PRN

## 2016-05-02 ENCOUNTER — Emergency Department (HOSPITAL_COMMUNITY)
Admission: EM | Admit: 2016-05-02 | Discharge: 2016-05-03 | Discharge status: Against Medical Advice | Payer: Medicaid Other | Attending: Emergency Medicine | Admitting: Emergency Medicine

## 2016-05-02 ENCOUNTER — Encounter (HOSPITAL_COMMUNITY): Payer: Self-pay

## 2016-05-02 DIAGNOSIS — Z7984 Long term (current) use of oral hypoglycemic drugs: Secondary | ICD-10-CM | POA: Insufficient documentation

## 2016-05-02 DIAGNOSIS — F17210 Nicotine dependence, cigarettes, uncomplicated: Secondary | ICD-10-CM | POA: Insufficient documentation

## 2016-05-02 DIAGNOSIS — I10 Essential (primary) hypertension: Secondary | ICD-10-CM | POA: Insufficient documentation

## 2016-05-02 DIAGNOSIS — Z79899 Other long term (current) drug therapy: Secondary | ICD-10-CM | POA: Diagnosis not present

## 2016-05-02 DIAGNOSIS — J449 Chronic obstructive pulmonary disease, unspecified: Secondary | ICD-10-CM | POA: Diagnosis not present

## 2016-05-02 DIAGNOSIS — E1140 Type 2 diabetes mellitus with diabetic neuropathy, unspecified: Secondary | ICD-10-CM | POA: Insufficient documentation

## 2016-05-02 DIAGNOSIS — R05 Cough: Secondary | ICD-10-CM | POA: Diagnosis present

## 2016-05-02 DIAGNOSIS — E039 Hypothyroidism, unspecified: Secondary | ICD-10-CM | POA: Diagnosis not present

## 2016-05-02 DIAGNOSIS — J069 Acute upper respiratory infection, unspecified: Secondary | ICD-10-CM | POA: Diagnosis not present

## 2016-05-02 LAB — BASIC METABOLIC PANEL
ANION GAP: 11 (ref 5–15)
BUN: 8 mg/dL (ref 6–20)
CO2: 27 mmol/L (ref 22–32)
Calcium: 9.7 mg/dL (ref 8.9–10.3)
Chloride: 102 mmol/L (ref 101–111)
Creatinine, Ser: 1.19 mg/dL — ABNORMAL HIGH (ref 0.44–1.00)
GFR calc Af Amer: 57 mL/min — ABNORMAL LOW (ref >60)
GFR, EST NON AFRICAN AMERICAN: 49 mL/min — AB (ref >60)
GLUCOSE: 92 mg/dL (ref 65–99)
POTASSIUM: 3.8 mmol/L (ref 3.5–5.1)
Sodium: 140 mmol/L (ref 135–145)

## 2016-05-02 LAB — CBC
HCT: 37.7 % (ref 36.0–46.0)
HEMOGLOBIN: 12.4 g/dL (ref 12.0–15.0)
MCH: 28.6 pg (ref 26.0–34.0)
MCHC: 32.9 g/dL (ref 30.0–36.0)
MCV: 87.1 fL (ref 78.0–100.0)
Platelets: 247 10*3/uL (ref 150–400)
RBC: 4.33 MIL/uL (ref 3.87–5.11)
RDW: 13.6 % (ref 11.5–15.5)
WBC: 14.7 10*3/uL — ABNORMAL HIGH (ref 4.0–10.5)

## 2016-05-02 MED ORDER — ALBUTEROL SULFATE (2.5 MG/3ML) 0.083% IN NEBU
5.0000 mg | INHALATION_SOLUTION | Freq: Once | RESPIRATORY_TRACT | Status: AC
  Administered 2016-05-02: 5 mg via RESPIRATORY_TRACT

## 2016-05-02 MED ORDER — ALBUTEROL SULFATE (2.5 MG/3ML) 0.083% IN NEBU
INHALATION_SOLUTION | RESPIRATORY_TRACT | Status: AC
  Filled 2016-05-02: qty 6

## 2016-05-02 NOTE — ED Triage Notes (Signed)
Onset today cough, shortness of breath, and epigastric pain. Pt took Ibuprofen last night.  Had same symptoms several weeks ago.  NAD in triage.

## 2016-05-03 ENCOUNTER — Emergency Department (HOSPITAL_COMMUNITY): Payer: Medicaid Other

## 2016-05-03 LAB — HEPATIC FUNCTION PANEL
ALT: 16 U/L (ref 14–54)
AST: 16 U/L (ref 15–41)
Albumin: 3.9 g/dL (ref 3.5–5.0)
Alkaline Phosphatase: 46 U/L (ref 38–126)
Total Bilirubin: 0.3 mg/dL (ref 0.3–1.2)
Total Protein: 6.3 g/dL — ABNORMAL LOW (ref 6.5–8.1)

## 2016-05-03 LAB — LIPASE, BLOOD: LIPASE: 33 U/L (ref 11–51)

## 2016-05-03 NOTE — ED Notes (Signed)
The pt reports that she feels like her throat is closing again  No distress sats 98%

## 2016-05-03 NOTE — ED Notes (Signed)
Pt called out stating she was in pain; will inform the primary RN

## 2016-05-03 NOTE — ED Notes (Signed)
The pt has multiple complaints  Cold cough asthma constipation abd pain chest pain  Since yesterday  Her breasts hurt  Rash on her face for 2 weeks maybe a temp for 2-3 months

## 2016-05-03 NOTE — ED Notes (Signed)
Pt wants to leave before the freezing rain starts  At 0500

## 2016-05-03 NOTE — ED Notes (Signed)
Pt demanding monitoring wires be removed, stating "I've been here since 9pm and I can hurt at home." Pt getting dressed

## 2016-05-03 NOTE — ED Notes (Signed)
The pt just retuirned from xray on her call light asking for pain med

## 2016-05-03 NOTE — ED Provider Notes (Signed)
[facility]-EMERGENCY DEPT Provider Note   CSN: [id number] Arrival date & time: 05/02/16  2113   By signing my name below, I, Clovis Pu, attest that this documentation has been prepared under the direction and in the presence of Gilda Crease, MD  Electronically Signed: Clovis Pu, ED Scribe. 05/03/16. 1:13 AM.   History   Chief Complaint Chief Complaint  Patient presents with  . Cough   The history is provided by the patient. No language interpreter was used.   HPI Comments:  Vanessa [name] is a 61 y.o. female, with a hx of COPD, who presents to the Emergency Department complaining of abdominal pain onset 2 AM yesterday. Pt also reports chest pain, a cough, subjective fever, a scab inside her right ear, SOB and a headache. She reports her symptoms are not similar to the symptoms she experiences with COPD. No alleviating factors noted. She denies recent prednisone use. Pt denies any other associated symptoms or complaints at this time.   Past Medical History:  Diagnosis Date  . Alcoholism (HCC)   . Allergic rhinitis   . Allergy   . Anxiety   . Arthritis    rheumatoid  . Asthma   . Back pain, chronic   . Chronic back pain 05/27/2012   Secondary to L4-5 herniated disc per patient  . Chronic headache   . COPD (chronic obstructive pulmonary disease) (HCC)   . Depression   . Diabetes mellitus   . Emphysema   . Emphysema of lung (HCC)   . Generalized anxiety disorder 05/26/2012  . GERD (gastroesophageal reflux disease)   . Grave's disease   . 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 Heart murmur   . Hernia, umbilical   . Herniated disc   . Herniated disc   . Hyperlipidemia   . Hypertension   . Menopause   . Neuropathy (HCC)   . OSA (obstructive sleep apnea)    has a cpap  . Osteoporosis   . Rheumatoid arthritis (HCC)   . Shingles   . Sleep apnea    wear c-pap  . Tear of lateral meniscus of right  knee 10/13/2013  . Wears dentures    top  . Wears glasses     Patient Active Problem List   Diagnosis Date Noted  . Atypical pneumonia 12/18/2014  . Essential hypertension 12/17/2014  . COPD exacerbation (HCC) 12/16/2014  . Tear of lateral meniscus of right knee 10/13/2013  . Chest pain 07/23/2013  . GERD (gastroesophageal reflux disease) 07/23/2013  . Morbid obesity (HCC) 07/23/2013  . Renal failure (ARF), acute on chronic (HCC) 07/23/2013  . HLD (hyperlipidemia) 07/23/2013  . Dysarthria 10/05/2012  . Diarrhea 05/27/2012  . Chronic back pain 05/27/2012  . Diabetes mellitus (HCC) 05/26/2012  . Hypothyroidism 05/26/2012  . 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 Depression 05/26/2012  . Generalized anxiety disorder 05/26/2012  . OSA (obstructive sleep apnea) 03/18/2011    Past Surgical History:  Procedure Laterality Date  . CHOLECYSTECTOMY    . EYE SURGERY     eye back in socker-rt  . FOOT SURGERY     cyst rt foot  . KNEE ARTHROSCOPY WITH LATERAL MENISECTOMY Right 10/13/2013   Procedure: RIGHT KNEE ARTHROSCOPY WITH PARTIAL LATERAL MENISECTOMY/DEBRIDEMENT/SHAVING ;  Surgeon: Eulas Post, MD;  Location: [facility];  Service: Orthopedics;  Laterality: Right;  . LAPAROSCOPY     age 38  . LEFT HEART CATHETERIZATION  WITH CORONARY ANGIOGRAM N/A 05/30/2012   Procedure: LEFT HEART CATHETERIZATION WITH CORONARY ANGIOGRAM;  Surgeon: [name], MD;  Location: [facility] CATH LAB;  Service: Cardiovascular;  Laterality: N/A;  . TUBAL LIGATION      OB History    Gravida Para Term Preterm AB Living   5 3     2 3    SAB TAB Ectopic Multiple Live Births   2               Home Medications    Prior to Admission medications   Medication Sig Start Date End Date Taking? Authorizing Provider  albuterol (PROAIR HFA) 108 (90 BASE) MCG/ACT inhaler Inhale 2 puffs into the lungs 4 (four) times daily. For COPD    Historical Provider, MD  atorvastatin (LIPITOR) 40 MG  tablet Take 1 tablet (40 mg total) by mouth daily. 10/06/12   Gust Rung, DO  azithromycin (ZITHROMAX) 250 MG tablet Take 1 tablet (250 mg total) by mouth daily. Take first 2 tablets together, then 1 every day until finished. 04/14/16   Shaune Pollack, MD  busPIRone (BUSPAR) 10 MG tablet Take 20 mg by mouth 2 (two) times daily.     Historical Provider, MD  cycloSPORINE (RESTASIS) 0.05 % ophthalmic emulsion Place 1 drop into both eyes 2 (two) times daily.    Historical Provider, MD  diclofenac sodium (VOLTAREN) 1 % GEL Apply 2 g topically 4 (four) times daily as needed (pain).     Historical Provider, MD  FLUoxetine (PROZAC) 20 MG capsule Take 60 mg by mouth every morning.     Historical Provider, MD  glipiZIDE (GLUCOTROL) 10 MG tablet Take 10 mg by mouth daily before breakfast.     Historical Provider, MD  ipratropium-albuterol (DUONEB) 0.5-2.5 (3) MG/3ML SOLN Take 3 mLs by nebulization every 6 (six) hours as needed (Asthma).    Historical Provider, MD  levothyroxine (SYNTHROID, LEVOTHROID) 175 MCG tablet Take 1 tablet (175 mcg total) by mouth every morning. 03/10/16   Audry Pili, PA-C  lisinopril (PRINIVIL,ZESTRIL) 10 MG tablet Take 1 tablet (10 mg total) by mouth daily. 03/10/16   Audry Pili, PA-C  loratadine (CLARITIN) 10 MG tablet Take 10 mg by mouth daily.     Historical Provider, MD  meloxicam (MOBIC) 15 MG tablet Take 15 mg by mouth daily.    Historical Provider, MD  metFORMIN (GLUCOPHAGE) 1000 MG tablet Take 1 tablet (1,000 mg total) by mouth 2 (two) times daily. Patient taking differently: Take 1,000 mg by mouth daily with breakfast.  03/10/16   Audry Pili, PA-C  mirtazapine (REMERON) 45 MG tablet Take 45 mg by mouth at bedtime.    Historical Provider, MD  omeprazole (PRILOSEC) 20 MG capsule Take 1 capsule (20 mg total) by mouth daily. 04/14/16 04/24/16  Shaune Pollack, MD  promethazine-dextromethorphan (PROMETHAZINE-DM) 6.25-15 MG/5ML syrup Take 5 mLs by mouth 4 (four) times daily as needed  for cough. 04/14/16   Shaune Pollack, MD  sennosides-docusate sodium (SENOKOT-S) 8.6-50 MG tablet Take 2 tablets by mouth daily. Patient taking differently: Take 1 tablet by mouth 4 (four) times daily.  10/13/13   Teryl Lucy, MD  sucralfate (CARAFATE) 1 GM/10ML suspension Take 10 mLs (1 g total) by mouth 4 (four) times daily -  with meals and at bedtime. 04/14/16   Shaune Pollack, MD  tiotropium (SPIRIVA) 18 MCG inhalation capsule Place 1 capsule (18 mcg total) into inhaler and inhale daily. 12/18/14   Valentino Nose, MD  verapamil (CALAN) 40 MG tablet  Take 40 mg by mouth 3 (three) times daily.    Historical Provider, MD    Family History Family History  Problem Relation Age of Onset  . Emphysema Mother   . Allergies Mother   . Asthma Mother   . Heart disease Mother   . Rheum arthritis Mother   . Diabetes Mother   . Kidney disease Mother   . Allergies Daughter   . Asthma Brother   . Breast cancer Other     aunt  . Lung cancer Brother   . Throat cancer Brother   . Colon cancer Neg Hx   . Colon polyps Neg Hx   . Esophageal cancer Neg Hx   . Rectal cancer Neg Hx   . Stomach cancer Neg Hx     Social History Social History  Substance Use Topics  . Smoking status: Current Every Day Smoker    Packs/day: 1.00    Years: 40.00    Types: Cigarettes  . Smokeless tobacco: Never Used  . Alcohol use No     Allergies   Fluocinolone; Suboxone [buprenorphine hcl-naloxone hcl]; Ciprofloxacin; and Penicillins   Review of Systems Review of Systems  Respiratory: Positive for cough and shortness of breath.   Gastrointestinal: Positive for abdominal pain.  Neurological: Positive for headaches.  All other systems reviewed and are negative.  Physical Exam Updated Vital Signs BP 126/78   Pulse 75   Temp 97.8 F (36.6 C) (Oral)   Resp 20   SpO2 96%   Physical Exam  Constitutional: She is oriented to person, place, and time. She appears well-developed and well-nourished. No  distress.  HENT:  Head: Normocephalic and atraumatic.  Right Ear: Hearing normal.  Left Ear: Hearing normal.  Nose: Nose normal.  Mouth/Throat: Oropharynx is clear and moist and mucous membranes are normal.  Eyes: Conjunctivae and EOM are normal. Pupils are equal, round, and reactive to light.  Neck: Normal range of motion. Neck supple.  Cardiovascular: Regular rhythm, S1 normal and S2 normal.  Exam reveals no gallop and no friction rub.   No murmur heard. Pulmonary/Chest: Effort normal and breath sounds normal. No respiratory distress. She exhibits tenderness.  Abdominal: Soft. Normal appearance and bowel sounds are normal. There is no hepatosplenomegaly. There is tenderness. There is no rebound, no guarding, no tenderness at McBurney's point and negative Murphy's sign. No hernia.  Musculoskeletal: Normal range of motion.  Neurological: She is alert and oriented to person, place, and time. She has normal strength. No cranial nerve deficit or sensory deficit. Coordination normal. GCS eye subscore is 4. GCS verbal subscore is 5. GCS motor subscore is 6.  Skin: Skin is warm, dry and intact. No rash noted. No cyanosis.  Scab in right ear.   Psychiatric: She has a normal mood and affect. Her speech is normal and behavior is normal. Thought content normal.  Nursing note and vitals reviewed.    ED Treatments / Results  DIAGNOSTIC STUDIES:  Oxygen Saturation is 98% on RA, normal by my interpretation.    COORDINATION OF CARE:  1:09 AM Discussed treatment plan with pt at bedside and pt agreed to plan.  Labs (all labs ordered are listed, but only abnormal results are displayed) Labs Reviewed  BASIC METABOLIC PANEL - Abnormal; Notable for the following:       Result Value   Creatinine, Ser 1.19 (*)    GFR calc non Af Amer 49 (*)    GFR calc Af Amer 57 (*)  All other components within normal limits  CBC - Abnormal; Notable for the following:    WBC 14.7 (*)    All other components  within normal limits  HEPATIC FUNCTION PANEL - Abnormal; Notable for the following:    Total Protein 6.3 (*)    Bilirubin, Direct <0.1 (*)    All other components within normal limits  LIPASE, BLOOD    EKG  EKG Interpretation None       Radiology Dg Abd Acute W/chest  Result Date: 05/03/2016 CLINICAL DATA:  Multiple complaints: Cold, cough, constipation, abdominal and chest pain, breast pain, face rash for 2 weeks. EXAM: DG ABDOMEN ACUTE W/ 1V CHEST COMPARISON:  Chest radiograph April 13, 2016 FINDINGS: Cardiomediastinal silhouette is normal. Lungs are clear, no pleural effusions. No pneumothorax. Soft tissue planes and included osseous structures are nonacute. Old RIGHT rib fractures. Bowel gas pattern is nondilated and nonobstructive. No significant retained large bowel stool. Surgical clips in the included right abdomen compatible with cholecystectomy. No intra-abdominal mass effect, pathologic calcifications or free air. Soft tissue planes and included osseous structures are non-suspicious. IMPRESSION: No acute cardiopulmonary process. Normal bowel gas pattern. Electronically Signed   By: Awilda Metro M.D.   On: 05/03/2016 01:54    Procedures Procedures (including critical care time)  Medications Ordered in ED Medications  albuterol (PROVENTIL) (2.5 MG/3ML) 0.083% nebulizer solution 5 mg (5 mg Nebulization Given 05/02/16 2146)     Initial Impression / Assessment and Plan / ED Course  I have reviewed the triage vital signs and the nursing notes.  Pertinent labs & imaging results that were available during my care of the patient were reviewed by me and considered in my medical decision making (see chart for details).     Patient presented with multiple complaints. Symptoms seem consistent with upper respiratory infection cold symptoms. Workup was initiated. Patient became agitated when she was not getting pain medication and ultimately eloped from the ER.  Final Clinical  Impressions(s) / ED Diagnoses   Final diagnoses:  Upper respiratory tract infection, unspecified type    New Prescriptions Discharge Medication List as of 05/03/2016  3:29 AM    I personally performed the services described in this documentation, which was scribed in my presence. The recorded information has been reviewed and is accurate.     Gilda Crease, MD 05/03/16 [phone]

## 2016-05-28 DIAGNOSIS — J392 Other diseases of pharynx: Secondary | ICD-10-CM

## 2016-05-28 HISTORY — DX: Other diseases of pharynx: J39.2

## 2016-05-31 ENCOUNTER — Ambulatory Visit (HOSPITAL_COMMUNITY)
Admission: EM | Admit: 2016-05-31 | Discharge: 2016-05-31 | Discharge status: Against Medical Advice | Payer: Medicaid Other

## 2016-05-31 ENCOUNTER — Ambulatory Visit (HOSPITAL_COMMUNITY)
Admission: EM | Admit: 2016-05-31 | Discharge: 2016-05-31 | Disposition: A | Discharge status: Home / Self Care | Payer: Medicaid Other | Attending: Family Medicine | Admitting: Family Medicine

## 2016-05-31 ENCOUNTER — Encounter (HOSPITAL_COMMUNITY): Payer: Self-pay | Admitting: Emergency Medicine

## 2016-05-31 DIAGNOSIS — L728 Other follicular cysts of the skin and subcutaneous tissue: Secondary | ICD-10-CM | POA: Diagnosis not present

## 2016-05-31 DIAGNOSIS — W57XXXA Bitten or stung by nonvenomous insect and other nonvenomous arthropods, initial encounter: Secondary | ICD-10-CM | POA: Diagnosis not present

## 2016-05-31 NOTE — ED Triage Notes (Signed)
The patient presented to the [facility] with a complaint of a rash on her face, chest, under both arms and on her back.

## 2016-05-31 NOTE — Discharge Instructions (Signed)
The cyst under the arms are not lymph nodes or cancer. These are subcutaneous cyst from hair follicles. Sometimes they can become infected. Yours do not look infected at this time. The small areas that you are concerned about on the skin are likely due to insect bites however this cannot be proven. For itching he may apply hydrocortisone cream. He may also take Benadryl if needed for itching. Follow-up with your primary care doctor as soon as possible.

## 2016-05-31 NOTE — ED Provider Notes (Signed)
CSN: [id number]     Arrival date & time 05/31/16  1808 History   First MD Initiated Contact with Patient 05/31/16 1849     Chief Complaint  Patient presents with  . Rash   (Consider location/radiation/quality/duration/timing/severity/associated sxs/prior Treatment) HPI  Past Medical History:  Diagnosis Date  . Alcoholism (HCC)   . Allergic rhinitis   . Allergy   . Anxiety   . Arthritis    rheumatoid  . Asthma   . Back pain, chronic   . Chronic back pain 05/27/2012   Secondary to L4-5 herniated disc per patient  . Chronic headache   . COPD (chronic obstructive pulmonary disease) (HCC)   . Depression   . Diabetes mellitus   . Emphysema   . Emphysema of lung (HCC)   . Generalized anxiety disorder 05/26/2012  . GERD (gastroesophageal reflux disease)   . Grave's disease   . 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 Heart murmur   . Hernia, umbilical   . Herniated disc   . Herniated disc   . Hyperlipidemia   . Hypertension   . Menopause   . Neuropathy (HCC)   . OSA (obstructive sleep apnea)    has a cpap  . Osteoporosis   . Rheumatoid arthritis (HCC)   . Shingles   . Sleep apnea    wear c-pap  . Tear of lateral meniscus of right knee 10/13/2013  . Wears dentures    top  . Wears glasses    Past Surgical History:  Procedure Laterality Date  . CHOLECYSTECTOMY    . EYE SURGERY     eye back in socker-rt  . FOOT SURGERY     cyst rt foot  . KNEE ARTHROSCOPY WITH LATERAL MENISECTOMY Right 10/13/2013   Procedure: RIGHT KNEE ARTHROSCOPY WITH PARTIAL LATERAL MENISECTOMY/DEBRIDEMENT/SHAVING ;  Surgeon: Eulas Post, MD;  Location: [facility];  Service: Orthopedics;  Laterality: Right;  . LAPAROSCOPY     age 53  . LEFT HEART CATHETERIZATION WITH CORONARY ANGIOGRAM N/A 05/30/2012   Procedure: LEFT HEART CATHETERIZATION WITH CORONARY ANGIOGRAM;  Surgeon: [name], MD;  Location: [facility] CATH LAB;   Service: Cardiovascular;  Laterality: N/A;  . TUBAL LIGATION     Family History  Problem Relation Age of Onset  . Emphysema Mother   . Allergies Mother   . Asthma Mother   . Heart disease Mother   . Rheum arthritis Mother   . Diabetes Mother   . Kidney disease Mother   . Allergies Daughter   . Asthma Brother   . Breast cancer Other     aunt  . Lung cancer Brother   . Throat cancer Brother   . Colon cancer Neg Hx   . Colon polyps Neg Hx   . Esophageal cancer Neg Hx   . Rectal cancer Neg Hx   . Stomach cancer Neg Hx    Social History  Substance Use Topics  . Smoking status: Current Every Day Smoker    Packs/day: 1.00    Years: 40.00    Types: Cigarettes  . Smokeless tobacco: Never Used  . Alcohol use No   OB History    Gravida Para Term Preterm AB Living   5 3     2 3    SAB TAB Ectopic Multiple Live Births   2             Review of Systems  Allergies  Suboxone [buprenorphine hcl-naloxone hcl]; Ciprofloxacin; and Penicillins  Home Medications   Prior to Admission medications   Medication Sig Start Date End Date Taking? Authorizing Provider  albuterol (PROAIR HFA) 108 (90 BASE) MCG/ACT inhaler Inhale 2 puffs into the lungs 4 (four) times daily. For COPD    Historical Provider, MD  atorvastatin (LIPITOR) 40 MG tablet Take 1 tablet (40 mg total) by mouth daily. 10/06/12   Gust Rung, DO  busPIRone (BUSPAR) 10 MG tablet Take 20 mg by mouth 2 (two) times daily.     Historical Provider, MD  cycloSPORINE (RESTASIS) 0.05 % ophthalmic emulsion Place 1 drop into both eyes 2 (two) times daily.    Historical Provider, MD  FLUoxetine (PROZAC) 20 MG capsule Take 60 mg by mouth every morning.     Historical Provider, MD  glipiZIDE (GLUCOTROL) 10 MG tablet Take 10 mg by mouth daily before breakfast.     Historical Provider, MD  ipratropium-albuterol (DUONEB) 0.5-2.5 (3) MG/3ML SOLN Take 3 mLs by nebulization every 6 (six) hours as needed (Asthma).    Historical Provider, MD   levothyroxine (SYNTHROID, LEVOTHROID) 175 MCG tablet Take 1 tablet (175 mcg total) by mouth every morning. 03/10/16   Audry Pili, PA-C  lisinopril (PRINIVIL,ZESTRIL) 10 MG tablet Take 1 tablet (10 mg total) by mouth daily. 03/10/16   Audry Pili, PA-C  loratadine (CLARITIN) 10 MG tablet Take 10 mg by mouth daily.     Historical Provider, MD  meloxicam (MOBIC) 15 MG tablet Take 15 mg by mouth daily.    Historical Provider, MD  metFORMIN (GLUCOPHAGE) 1000 MG tablet Take 1 tablet (1,000 mg total) by mouth 2 (two) times daily. Patient taking differently: Take 1,000 mg by mouth daily with breakfast.  03/10/16   Audry Pili, PA-C  mirtazapine (REMERON) 45 MG tablet Take 45 mg by mouth at bedtime.    Historical Provider, MD  promethazine-dextromethorphan (PROMETHAZINE-DM) 6.25-15 MG/5ML syrup Take 5 mLs by mouth 4 (four) times daily as needed for cough. 04/14/16   Shaune Pollack, MD  sennosides-docusate sodium (SENOKOT-S) 8.6-50 MG tablet Take 2 tablets by mouth daily. Patient taking differently: Take 1 tablet by mouth 4 (four) times daily.  10/13/13   Teryl Lucy, MD  tiotropium (SPIRIVA) 18 MCG inhalation capsule Place 1 capsule (18 mcg total) into inhaler and inhale daily. 12/18/14   Valentino Nose, MD  verapamil (CALAN) 40 MG tablet Take 40 mg by mouth 3 (three) times daily.    Historical Provider, MD   Meds Ordered and Administered this Visit  Medications - No data to display  BP 156/73 (BP Location: Right Arm)   Pulse 94   Temp 98.9 F (37.2 C) (Oral)   Resp 18   SpO2 98%  No data found.   Physical Exam  Urgent Care Course     Procedures (including critical care time)  Labs Review Labs Reviewed - No data to display  Imaging Review No results found.   Visual Acuity Review  Right Eye Distance:   Left Eye Distance:   Bilateral Distance:    Right Eye Near:   Left Eye Near:    Bilateral Near:         MDM   1. Other follicular cysts of the skin and subcutaneous tissue    2. Insect bite, initial encounter    The cyst under the arms are not lymph nodes or cancer. These are subcutaneous cyst from hair follicles. Sometimes they can become infected. Yours do not look infected at  this time. The small areas that you are concerned about on the skin are likely due to insect bites however this cannot be proven. For itching he may apply hydrocortisone cream. He may also take Benadryl if needed for itching. Follow-up with your primary care doctor as soon as possible.     Hayden Rasmussen, NP 05/31/16 [phone]

## 2016-06-06 ENCOUNTER — Encounter (HOSPITAL_COMMUNITY): Payer: Self-pay | Admitting: Emergency Medicine

## 2016-06-06 ENCOUNTER — Emergency Department (HOSPITAL_COMMUNITY)
Admission: EM | Admit: 2016-06-06 | Discharge: 2016-06-06 | Disposition: A | Discharge status: Home / Self Care | Payer: Medicaid Other | Attending: Emergency Medicine | Admitting: Emergency Medicine

## 2016-06-06 DIAGNOSIS — J449 Chronic obstructive pulmonary disease, unspecified: Secondary | ICD-10-CM | POA: Insufficient documentation

## 2016-06-06 DIAGNOSIS — E1140 Type 2 diabetes mellitus with diabetic neuropathy, unspecified: Secondary | ICD-10-CM | POA: Diagnosis not present

## 2016-06-06 DIAGNOSIS — J029 Acute pharyngitis, unspecified: Secondary | ICD-10-CM | POA: Diagnosis present

## 2016-06-06 DIAGNOSIS — J00 Acute nasopharyngitis [common cold]: Secondary | ICD-10-CM | POA: Diagnosis not present

## 2016-06-06 DIAGNOSIS — Z7984 Long term (current) use of oral hypoglycemic drugs: Secondary | ICD-10-CM | POA: Diagnosis not present

## 2016-06-06 DIAGNOSIS — F17210 Nicotine dependence, cigarettes, uncomplicated: Secondary | ICD-10-CM | POA: Insufficient documentation

## 2016-06-06 LAB — RAPID STREP SCREEN (MED CTR MEBANE ONLY): STREPTOCOCCUS, GROUP A SCREEN (DIRECT): NEGATIVE

## 2016-06-06 NOTE — ED Triage Notes (Signed)
Patient with sore throat that started today.  She states that the pain was sudden.  She is taking sudafed and salt water gargles.  She did take a vicodin which helped with the pain.  She wants to make sure she does not have strep.

## 2016-06-06 NOTE — Discharge Instructions (Signed)
Continue over the counter medicines for your symptoms Follow up with your PCP Return for worsening symptoms

## 2016-06-06 NOTE — ED Provider Notes (Signed)
[facility]Y DEPT Provider Note   CSN: [id number] Arrival date & time: 06/06/16  2123   By signing my name below, I, Soijett Blue, attest that this documentation has been prepared under the direction and in the presence of Bethel Born, PA-C Electronically Signed: Soijett Blue, ED Scribe. 06/06/16. 10:11 PM.  History   Chief Complaint Chief Complaint  Patient presents with  . Sore Throat  . URI    HPI Vanessa [name] is a 61 y.o. female with a PMHx of COPD, DM, who presents to the Emergency Department complaining of sore throat onset yesterday. Pt reports associated painful swallowing, cough, nasal congestion, tinnitus, bilateral ear itching, and mild HA. Pt has tried sudafed, ibuprofen, vicodin, and nebulizer treatments with mild relief of her symptoms. Pt reports that she has sick contacts who have had strep throat. She states that she has had recurrent shingles that she is currently being treated for. Denies wheezing, SOB, inability to swallow, and any other symptoms. She reports that she smoked cigarettes for 40 years and is enrolling in a smoking cessation course in May 2018.   The history is provided by the patient. No language interpreter was used.    Past Medical History:  Diagnosis Date  . Alcoholism (HCC)   . Allergic rhinitis   . Allergy   . Anxiety   . Arthritis    rheumatoid  . Asthma   . Back pain, chronic   . Chronic back pain 05/27/2012   Secondary to L4-5 herniated disc per patient  . Chronic headache   . COPD (chronic obstructive pulmonary disease) (HCC)   . Depression   . Diabetes mellitus   . Emphysema   . Emphysema of lung (HCC)   . Generalized anxiety disorder 05/26/2012  . GERD (gastroesophageal reflux disease)   . Grave's disease   . 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 Heart murmur   . Hernia, umbilical   . Herniated disc   . Herniated disc   . Hyperlipidemia   .  Hypertension   . Menopause   . Neuropathy (HCC)   . OSA (obstructive sleep apnea)    has a cpap  . Osteoporosis   . Rheumatoid arthritis (HCC)   . Shingles   . Sleep apnea    wear c-pap  . Tear of lateral meniscus of right knee 10/13/2013  . Wears dentures    top  . Wears glasses     Patient Active Problem List   Diagnosis Date Noted  . Atypical pneumonia 12/18/2014  . Essential hypertension 12/17/2014  . COPD exacerbation (HCC) 12/16/2014  . Tear of lateral meniscus of right knee 10/13/2013  . Chest pain 07/23/2013  . GERD (gastroesophageal reflux disease) 07/23/2013  . Morbid obesity (HCC) 07/23/2013  . Renal failure (ARF), acute on chronic (HCC) 07/23/2013  . HLD (hyperlipidemia) 07/23/2013  . Dysarthria 10/05/2012  . Diarrhea 05/27/2012  . Chronic back pain 05/27/2012  . Diabetes mellitus (HCC) 05/26/2012  . Hypothyroidism 05/26/2012  . 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 Depression 05/26/2012  . Generalized anxiety disorder 05/26/2012  . OSA (obstructive sleep apnea) 03/18/2011    Past Surgical History:  Procedure Laterality Date  . CHOLECYSTECTOMY    . EYE SURGERY     eye back in socker-rt  . FOOT SURGERY     cyst rt foot  . KNEE ARTHROSCOPY WITH LATERAL MENISECTOMY Right 10/13/2013   Procedure: RIGHT KNEE ARTHROSCOPY WITH PARTIAL  LATERAL MENISECTOMY/DEBRIDEMENT/SHAVING ;  Surgeon: Eulas Post, MD;  Location:  SUR[facility]ERY [facility];  Service: Orthopedics;  Laterality: Right;  . LAPAROSCOPY     age 41  . LEFT HEART CATHETERIZATION WITH CORONARY ANGIOGRAM N/A 05/30/2012   Procedure: LEFT HEART CATHETERIZATION WITH CORONARY ANGIOGRAM;  Surgeon: [name], MD;  Location: [facility] CATH LAB;  Service: Cardiovascular;  Laterality: N/A;  . TUBAL LIGATION      OB History    Gravida Para Term Preterm AB Living   5 3     2 3    SAB TAB Ectopic Multiple Live Births   2               Home Medications    Prior to Admission medications     Medication Sig Start Date End Date Taking? Authorizing Provider  albuterol (PROAIR HFA) 108 (90 BASE) MCG/ACT inhaler Inhale 2 puffs into the lungs 4 (four) times daily. For COPD    Historical Provider, MD  atorvastatin (LIPITOR) 40 MG tablet Take 1 tablet (40 mg total) by mouth daily. 10/06/12   12/07/12, DO  busPIRone (BUSPAR) 10 MG tablet Take 20 mg by mouth 2 (two) times daily.     Historical Provider, MD  cycloSPORINE (RESTASIS) 0.05 % ophthalmic emulsion Place 1 drop into both eyes 2 (two) times daily.    Historical Provider, MD  FLUoxetine (PROZAC) 20 MG capsule Take 60 mg by mouth every morning.     Historical Provider, MD  glipiZIDE (GLUCOTROL) 10 MG tablet Take 10 mg by mouth daily before breakfast.     Historical Provider, MD  ipratropium-albuterol (DUONEB) 0.5-2.5 (3) MG/3ML SOLN Take 3 mLs by nebulization every 6 (six) hours as needed (Asthma).    Historical Provider, MD  levothyroxine (SYNTHROID, LEVOTHROID) 175 MCG tablet Take 1 tablet (175 mcg total) by mouth every morning. 03/10/16   14/12/17, PA-C  lisinopril (PRINIVIL,ZESTRIL) 10 MG tablet Take 1 tablet (10 mg total) by mouth daily. 03/10/16   14/12/17, PA-C  loratadine (CLARITIN) 10 MG tablet Take 10 mg by mouth daily.     Historical Provider, MD  meloxicam (MOBIC) 15 MG tablet Take 15 mg by mouth daily.    Historical Provider, MD  metFORMIN (GLUCOPHAGE) 1000 MG tablet Take 1 tablet (1,000 mg total) by mouth 2 (two) times daily. Patient taking differently: Take 1,000 mg by mouth daily with breakfast.  03/10/16   14/12/17, PA-C  mirtazapine (REMERON) 45 MG tablet Take 45 mg by mouth at bedtime.    Historical Provider, MD  promethazine-dextromethorphan (PROMETHAZINE-DM) 6.25-15 MG/5ML syrup Take 5 mLs by mouth 4 (four) times daily as needed for cough. 04/14/16   04/16/16, MD  sennosides-docusate sodium (SENOKOT-S) 8.6-50 MG tablet Take 2 tablets by mouth daily. Patient taking differently: Take 1 tablet by mouth 4  (four) times daily.  10/13/13   10/15/13, MD  tiotropium (SPIRIVA) 18 MCG inhalation capsule Place 1 capsule (18 mcg total) into inhaler and inhale daily. 12/18/14   12/20/14, MD  verapamil (CALAN) 40 MG tablet Take 40 mg by mouth 3 (three) times daily.    Historical Provider, MD    Family History Family History  Problem Relation Age of Onset  . Emphysema Mother   . Allergies Mother   . Asthma Mother   . Heart disease Mother   . Rheum arthritis Mother   . Diabetes Mother   . Kidney disease Mother   . Allergies Daughter   .  Asthma Brother   . Breast cancer Other     aunt  . Lung cancer Brother   . Throat cancer Brother   . Colon cancer Neg Hx   . Colon polyps Neg Hx   . Esophageal cancer Neg Hx   . Rectal cancer Neg Hx   . Stomach cancer Neg Hx     Social History Social History  Substance Use Topics  . Smoking status: Current Every Day Smoker    Packs/day: 1.00    Years: 40.00    Types: Cigarettes  . Smokeless tobacco: Never Used  . Alcohol use No     Allergies   Suboxone [buprenorphine hcl-naloxone hcl]; Ciprofloxacin; and Penicillins   Review of Systems Review of Systems  Constitutional: Negative for fever.  HENT: Positive for congestion, ear pain, sore throat, tinnitus and voice change ("lost voice"). Negative for trouble swallowing.        +Painful swallowing. +Bilateral ear itching  Respiratory: Positive for cough. Negative for shortness of breath and wheezing.   Neurological: Positive for headaches.     Physical Exam Updated Vital Signs BP 123/89 (BP Location: Right Arm)   Pulse 97   Temp 98.5 F (36.9 C) (Oral)   Resp 20   Ht 5\' 9"  (1.753 m)   Wt 209 lb (94.8 kg)   SpO2 97%   BMI 30.86 kg/m   Physical Exam  Constitutional: She is oriented to person, place, and time. She appears well-developed and well-nourished. No distress.  HENT:  Head: Normocephalic and atraumatic.  Right Ear: Hearing, tympanic membrane, external ear and ear  canal normal.  Left Ear: Hearing, tympanic membrane, external ear and ear canal normal.  Nose: Nose normal.  Mouth/Throat: Uvula is midline, oropharynx is clear and moist and mucous membranes are normal.  Eyes: EOM are normal.  Neck: Normal range of motion. Neck supple.  Cardiovascular: Normal rate, regular rhythm and normal heart sounds.  Exam reveals no gallop and no friction rub.   No murmur heard. Pulmonary/Chest: Effort normal and breath sounds normal. No respiratory distress. She has no wheezes. She has no rales.  Abdominal: She exhibits no distension.  Musculoskeletal: Normal range of motion.  Lymphadenopathy:    She has no cervical adenopathy.  Neurological: She is alert and oriented to person, place, and time.  Skin: Skin is warm and dry.  Psychiatric: She has a normal mood and affect. Her behavior is normal.  Nursing note and vitals reviewed.    ED Treatments / Results  DIAGNOSTIC STUDIES: Oxygen Saturation is 97% on RA, nl by my interpretation.    COORDINATION OF CARE: 10:05 PM Discussed treatment plan with pt at bedside which includes rapid strep screen and culture and pt agreed to plan.    Labs (all labs ordered are listed, but only abnormal results are displayed) Labs Reviewed  RAPID STREP SCREEN (NOT AT [facility])  CULTURE, GROUP A STREP [facility])    Procedures Procedures (including critical care time)  Medications Ordered in ED Medications - No data to display   Initial Impression / Assessment and Plan / ED Course  I have reviewed the triage vital signs and the nursing notes.  Pertinent labs that were available during my care of the patient were reviewed by me and considered in my medical decision making (see chart for details).  Pt with negative strep. Diagnosis of viral pharyngitis. No abx indicated at this time. Discussed that results of strep culture are pending and patient will be informed if positive result  and abx will be called in at that time.  Discharge with symptomatic tx. No evidence of dehydration. Pt is tolerating secretions. Presentation not concerning for peritonsillar abscess or spread of infection to deep spaces of the throat; patent airway. Specific return precautions discussed. Recommended PCP follow up. Pt appears safe for discharge.  Final Clinical Impressions(s) / ED Diagnoses   Final diagnoses:  Acute nasopharyngitis    New Prescriptions Discharge Medication List as of 06/06/2016 10:26 PM     I personally performed the services described in this documentation, which was scribed in my presence. The recorded information has been reviewed and is accurate.     Bethel Born, PA-C 06/07/16 0025    Eber Hong, MD 06/07/16 1027

## 2016-06-06 NOTE — ED Notes (Signed)
Pt stable, ambulatory, states understanding of discharge inistructions

## 2016-06-07 ENCOUNTER — Emergency Department (HOSPITAL_COMMUNITY)
Admission: EM | Admit: 2016-06-07 | Discharge: 2016-06-07 | Disposition: A | Discharge status: Home / Self Care | Payer: Medicaid Other | Attending: Emergency Medicine | Admitting: Emergency Medicine

## 2016-06-07 ENCOUNTER — Encounter (HOSPITAL_COMMUNITY): Payer: Self-pay

## 2016-06-07 DIAGNOSIS — I10 Essential (primary) hypertension: Secondary | ICD-10-CM | POA: Diagnosis not present

## 2016-06-07 DIAGNOSIS — Z7984 Long term (current) use of oral hypoglycemic drugs: Secondary | ICD-10-CM | POA: Insufficient documentation

## 2016-06-07 DIAGNOSIS — J029 Acute pharyngitis, unspecified: Secondary | ICD-10-CM

## 2016-06-07 DIAGNOSIS — F17210 Nicotine dependence, cigarettes, uncomplicated: Secondary | ICD-10-CM | POA: Diagnosis not present

## 2016-06-07 DIAGNOSIS — E1140 Type 2 diabetes mellitus with diabetic neuropathy, unspecified: Secondary | ICD-10-CM | POA: Insufficient documentation

## 2016-06-07 DIAGNOSIS — E039 Hypothyroidism, unspecified: Secondary | ICD-10-CM | POA: Diagnosis not present

## 2016-06-07 DIAGNOSIS — J449 Chronic obstructive pulmonary disease, unspecified: Secondary | ICD-10-CM | POA: Diagnosis not present

## 2016-06-07 DIAGNOSIS — Z79899 Other long term (current) drug therapy: Secondary | ICD-10-CM | POA: Insufficient documentation

## 2016-06-07 MED ORDER — DEXAMETHASONE SODIUM PHOSPHATE 10 MG/ML IJ SOLN
10.0000 mg | Freq: Once | INTRAMUSCULAR | Status: AC
  Administered 2016-06-07: 10 mg via INTRAMUSCULAR
  Filled 2016-06-07: qty 1

## 2016-06-07 MED ORDER — CLINDAMYCIN HCL 150 MG PO CAPS: 300.0000 mg | ORAL_CAPSULE | Freq: Three times a day (TID) | ORAL | 0 refills | Status: DC

## 2016-06-07 MED ORDER — IBUPROFEN 800 MG PO TABS
800.0000 mg | ORAL_TABLET | Freq: Once | ORAL | Status: AC
  Administered 2016-06-07: 800 mg via ORAL
  Filled 2016-06-07: qty 1

## 2016-06-07 MED ORDER — LIDOCAINE VISCOUS 2 % MT SOLN
15.0000 mL | Freq: Once | OROMUCOSAL | Status: AC
  Administered 2016-06-07: 15 mL via OROMUCOSAL
  Filled 2016-06-07: qty 15

## 2016-06-07 MED ORDER — LIDOCAINE VISCOUS 2 % MT SOLN: 15.0000 mL | OROMUCOSAL | 0 refills | Status: DC | PRN

## 2016-06-07 NOTE — ED Triage Notes (Signed)
Patient seen last night and requesting recheck. States that her sore throat is worse, negative strept last night, complains of ear pain and headache with nausea. Also thinks she has another outburst of shingles on buttocks, alert and oriented, NAD

## 2016-06-07 NOTE — ED Provider Notes (Signed)
[facility] Provider Note   CSN: [id number] Arrival date & time: 06/07/16  2423     History   Chief Complaint Chief Complaint  Patient presents with  . Shingles, cough,  sore throat    HPI Vanessa [name] is a 61 y.o. female.  The history is provided by the patient and medical records. No language interpreter was used.   Vanessa [name] is a 61 y.o. female  with an extensive PMH listed below including DM, COPD, anxiety, depression, chronic pain who presents to the Emergency Department complaining of worsening sore throat since yesterday. She reports associated nasal congestion, dry cough and generalized headache. She has taken TheraFlu and Vicodin with little relief of her pain. Denies chest pain, difficulty breathing/shortness of breath. She has been able to tolerate PO and keeping medication down without a problem. She was seen in the emergency department yesterday where rapid strep was performed and negative. No rx given at that visit. Patient does report that granddaughter was recently diagnosed with strep and on ABX. Additionally, Patient complaining of ankle rash consistent with prior shingles outbreaks on the top of her buttocks. She has been on acyclovir daily for her shingles for the last 3 years. No fevers. No drainage from the site.  Past Medical History:  Diagnosis Date  . Alcoholism (HCC)   . Allergic rhinitis   . Allergy   . Anxiety   . Arthritis    rheumatoid  . Asthma   . Back pain, chronic   . Chronic back pain 05/27/2012   Secondary to L4-5 herniated disc per patient  . Chronic headache   . COPD (chronic obstructive pulmonary disease) (HCC)   . Depression   . Diabetes mellitus   . Emphysema   . Emphysema of lung (HCC)   . Generalized anxiety disorder 05/26/2012  . GERD (gastroesophageal reflux disease)   . Grave's disease   . 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 Heart murmur   .  Hernia, umbilical   . Herniated disc   . Herniated disc   . Hyperlipidemia   . Hypertension   . Menopause   . Neuropathy (HCC)   . OSA (obstructive sleep apnea)    has a cpap  . Osteoporosis   . Rheumatoid arthritis (HCC)   . Shingles   . Sleep apnea    wear c-pap  . Tear of lateral meniscus of right knee 10/13/2013  . Wears dentures    top  . Wears glasses     Patient Active Problem List   Diagnosis Date Noted  . Atypical pneumonia 12/18/2014  . Essential hypertension 12/17/2014  . COPD exacerbation (HCC) 12/16/2014  . Tear of lateral meniscus of right knee 10/13/2013  . Chest pain 07/23/2013  . GERD (gastroesophageal reflux disease) 07/23/2013  . Morbid obesity (HCC) 07/23/2013  . Renal failure (ARF), acute on chronic (HCC) 07/23/2013  . HLD (hyperlipidemia) 07/23/2013  . Dysarthria 10/05/2012  . Diarrhea 05/27/2012  . Chronic back pain 05/27/2012  . Diabetes mellitus (HCC) 05/26/2012  . Hypothyroidism 05/26/2012  . 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 Depression 05/26/2012  . Generalized anxiety disorder 05/26/2012  . OSA (obstructive sleep apnea) 03/18/2011    Past Surgical History:  Procedure Laterality Date  . CHOLECYSTECTOMY    . EYE SURGERY     eye back in socker-rt  . FOOT SURGERY     cyst rt foot  . KNEE ARTHROSCOPY WITH LATERAL  MENISECTOMY Right 10/13/2013   Procedure: RIGHT KNEE ARTHROSCOPY WITH PARTIAL LATERAL MENISECTOMY/DEBRIDEMENT/SHAVING ;  Surgeon: Eulas Post, MD;  Location: [facility];  Service: Orthopedics;  Laterality: Right;  . LAPAROSCOPY     age 89  . LEFT HEART CATHETERIZATION WITH CORONARY ANGIOGRAM N/A 05/30/2012   Procedure: LEFT HEART CATHETERIZATION WITH CORONARY ANGIOGRAM;  Surgeon: [name], MD;  Location: [facility] CATH LAB;  Service: Cardiovascular;  Laterality: N/A;  . TUBAL LIGATION      OB History    Gravida Para Term Preterm AB Living   5 3     2 3    SAB TAB Ectopic Multiple Live Births    2               Home Medications    Prior to Admission medications   Medication Sig Start Date End Date Taking? Authorizing Provider  albuterol (PROAIR HFA) 108 (90 BASE) MCG/ACT inhaler Inhale 2 puffs into the lungs 4 (four) times daily. For COPD    Historical Provider, MD  atorvastatin (LIPITOR) 40 MG tablet Take 1 tablet (40 mg total) by mouth daily. 10/06/12   Gust Rung, DO  busPIRone (BUSPAR) 10 MG tablet Take 20 mg by mouth 2 (two) times daily.     Historical Provider, MD  clindamycin (CLEOCIN) 150 MG capsule Take 2 capsules (300 mg total) by mouth 3 (three) times daily. 06/07/16   Chase Picket Ward, PA-C  cycloSPORINE (RESTASIS) 0.05 % ophthalmic emulsion Place 1 drop into both eyes 2 (two) times daily.    Historical Provider, MD  FLUoxetine (PROZAC) 20 MG capsule Take 60 mg by mouth every morning.     Historical Provider, MD  glipiZIDE (GLUCOTROL) 10 MG tablet Take 10 mg by mouth daily before breakfast.     Historical Provider, MD  ipratropium-albuterol (DUONEB) 0.5-2.5 (3) MG/3ML SOLN Take 3 mLs by nebulization every 6 (six) hours as needed (Asthma).    Historical Provider, MD  levothyroxine (SYNTHROID, LEVOTHROID) 175 MCG tablet Take 1 tablet (175 mcg total) by mouth every morning. 03/10/16   Audry Pili, PA-C  lidocaine (XYLOCAINE) 2 % solution Use as directed 15 mLs in the mouth or throat as needed for mouth pain. 06/07/16   Chase Picket Ward, PA-C  lisinopril (PRINIVIL,ZESTRIL) 10 MG tablet Take 1 tablet (10 mg total) by mouth daily. 03/10/16   Audry Pili, PA-C  loratadine (CLARITIN) 10 MG tablet Take 10 mg by mouth daily.     Historical Provider, MD  meloxicam (MOBIC) 15 MG tablet Take 15 mg by mouth daily.    Historical Provider, MD  metFORMIN (GLUCOPHAGE) 1000 MG tablet Take 1 tablet (1,000 mg total) by mouth 2 (two) times daily. Patient taking differently: Take 1,000 mg by mouth daily with breakfast.  03/10/16   Audry Pili, PA-C  mirtazapine (REMERON) 45 MG tablet Take 45  mg by mouth at bedtime.    Historical Provider, MD  promethazine-dextromethorphan (PROMETHAZINE-DM) 6.25-15 MG/5ML syrup Take 5 mLs by mouth 4 (four) times daily as needed for cough. 04/14/16   Shaune Pollack, MD  sennosides-docusate sodium (SENOKOT-S) 8.6-50 MG tablet Take 2 tablets by mouth daily. Patient taking differently: Take 1 tablet by mouth 4 (four) times daily.  10/13/13   Teryl Lucy, MD  tiotropium (SPIRIVA) 18 MCG inhalation capsule Place 1 capsule (18 mcg total) into inhaler and inhale daily. 12/18/14   Valentino Nose, MD  verapamil (CALAN) 40 MG tablet Take 40 mg by mouth 3 (three) times daily.  Historical Provider, MD    Family History Family History  Problem Relation Age of Onset  . Emphysema Mother   . Allergies Mother   . Asthma Mother   . Heart disease Mother   . Rheum arthritis Mother   . Diabetes Mother   . Kidney disease Mother   . Allergies Daughter   . Asthma Brother   . Breast cancer Other     aunt  . Lung cancer Brother   . Throat cancer Brother   . Colon cancer Neg Hx   . Colon polyps Neg Hx   . Esophageal cancer Neg Hx   . Rectal cancer Neg Hx   . Stomach cancer Neg Hx     Social History Social History  Substance Use Topics  . Smoking status: Current Every Day Smoker    Packs/day: 1.00    Years: 40.00    Types: Cigarettes  . Smokeless tobacco: Never Used  . Alcohol use No     Allergies   Suboxone [buprenorphine hcl-naloxone hcl]; Ciprofloxacin; and Penicillins   Review of Systems Review of Systems  Constitutional: Negative for chills and fever.  HENT: Positive for congestion and sore throat.   Eyes: Negative for visual disturbance.  Respiratory: Positive for cough. Negative for shortness of breath and wheezing.   Cardiovascular: Negative.   Gastrointestinal: Negative for abdominal pain, nausea and vomiting.  Genitourinary: Negative for dysuria.  Musculoskeletal: Negative for neck stiffness.  Skin: Positive for rash.    Neurological: Negative for headaches.     Physical Exam Updated Vital Signs BP (!) 130/105 (BP Location: Right Arm)   Pulse 92   Temp 98.7 F (37.1 C) (Oral)   Resp 22   SpO2 97%   Physical Exam  Constitutional: She is oriented to person, place, and time. She appears well-developed and well-nourished. No distress.  HENT:  Head: Normocephalic and atraumatic.  Oropharynx with mild erythema, no exudates or tonsillar hypertrophy.  Cardiovascular: Normal rate, regular rhythm and normal heart sounds.   No murmur heard. Pulmonary/Chest: Effort normal and breath sounds normal. No respiratory distress.  Abdominal: Soft. She exhibits no distension. There is no tenderness.  Musculoskeletal: She exhibits no edema.  Lymphadenopathy:    She has cervical adenopathy.  Neurological: She is alert and oriented to person, place, and time.  Skin: Skin is warm and dry.  Red patch of vesicular rash to buttocks.  Nursing note and vitals reviewed.    ED Treatments / Results  Labs (all labs ordered are listed, but only abnormal results are displayed) Labs Reviewed - No data to display  EKG  EKG Interpretation None       Radiology No results found.  Procedures Procedures (including critical care time)  Medications Ordered in ED Medications  dexamethasone (DECADRON) injection 10 mg (10 mg Intramuscular Given 06/07/16 0937)  lidocaine (XYLOCAINE) 2 % viscous mouth solution 15 mL (15 mLs Mouth/Throat Given 06/07/16 0937)  ibuprofen (ADVIL,MOTRIN) tablet 800 mg (800 mg Oral Given 06/07/16 1011)     Initial Impression / Assessment and Plan / ED Course  I have reviewed the triage vital signs and the nursing notes.  Pertinent labs & imaging results that were available during my care of the patient were reviewed by me and considered in my medical decision making (see chart for details).    Vanessa [name] is a 61 y.o. female who presents to ED for two complaints:  1. Persistent sore  throat x 2 days. Seen in ED yesterday for same.  Rapid strep negative at that time. Culture still in process. Exam not concerning for PTA or spread to soft tissue. Afebrile and tolerating PO. Decadron and viscous lidocaine given with some relief. Patient is DM with well-controlled sugars per patient. She has taken prednisone in the past and understands importance of following DM diet and monitoring sugars closely. Rx for viscous lido given. Patient does have granddaughter who tested positive for strep and she is concerned she needs ABX. Given + exposure and 2nd visit to the ER, will treat. She is PCN allergic - rx for clindamycin given.   2. Rash to buttocks c/w prior shingles outbreaks. On prophylactic acyclovir daily. Increase acyclovir to 800mg  dose for the next 3-4 days. Call PCP in the morning to schedule follow up appointment.    Final Clinical Impressions(s) / ED Diagnoses   Final diagnoses:  Pharyngitis, unspecified etiology    New Prescriptions Discharge Medication List as of 06/07/2016 10:07 AM    START taking these medications   Details  clindamycin (CLEOCIN) 150 MG capsule Take 2 capsules (300 mg total) by mouth 3 (three) times daily., Starting Sun 06/07/2016, Print    lidocaine (XYLOCAINE) 2 % solution Use as directed 15 mLs in the mouth or throat as needed for mouth pain., Starting Sun 06/07/2016, Print         [facility], PA-C 06/07/16 1041    08/07/16, MD 06/13/16 [phone]

## 2016-06-07 NOTE — Discharge Instructions (Signed)
Please call your primary care provider tomorrow morning to schedule a follow up appointment.  Return to the ER for new or worsening symptoms, any additional concerns.

## 2016-06-07 NOTE — ED Notes (Addendum)
Pt. Upset no narcotics were prescribed. RN educated about steroids/lidocaine/antibiotics to help with her pain. Pt. Verbalized understanding.   Pt. Also educated about excessive tylenol intake with hydrocodone and OTC cough syrup.

## 2016-06-08 ENCOUNTER — Encounter (HOSPITAL_COMMUNITY): Payer: Self-pay | Admitting: *Deleted

## 2016-06-08 ENCOUNTER — Inpatient Hospital Stay (HOSPITAL_COMMUNITY)
Admission: EM | Admit: 2016-06-08 | Discharge: 2016-06-12 | DRG: 133 | Disposition: A | Discharge status: Home / Self Care | Payer: Medicaid Other | Attending: Internal Medicine | Admitting: Internal Medicine

## 2016-06-08 ENCOUNTER — Emergency Department (HOSPITAL_COMMUNITY): Payer: Medicaid Other

## 2016-06-08 DIAGNOSIS — J029 Acute pharyngitis, unspecified: Secondary | ICD-10-CM

## 2016-06-08 DIAGNOSIS — J392 Other diseases of pharynx: Secondary | ICD-10-CM | POA: Diagnosis present

## 2016-06-08 DIAGNOSIS — Z7984 Long term (current) use of oral hypoglycemic drugs: Secondary | ICD-10-CM

## 2016-06-08 DIAGNOSIS — G8929 Other chronic pain: Secondary | ICD-10-CM | POA: Diagnosis present

## 2016-06-08 DIAGNOSIS — Z833 Family history of diabetes mellitus: Secondary | ICD-10-CM

## 2016-06-08 DIAGNOSIS — J449 Chronic obstructive pulmonary disease, unspecified: Secondary | ICD-10-CM | POA: Diagnosis present

## 2016-06-08 DIAGNOSIS — K219 Gastro-esophageal reflux disease without esophagitis: Secondary | ICD-10-CM | POA: Diagnosis present

## 2016-06-08 DIAGNOSIS — Z801 Family history of malignant neoplasm of trachea, bronchus and lung: Secondary | ICD-10-CM

## 2016-06-08 DIAGNOSIS — Z79899 Other long term (current) drug therapy: Secondary | ICD-10-CM

## 2016-06-08 DIAGNOSIS — F1011 Alcohol abuse, in remission: Secondary | ICD-10-CM | POA: Diagnosis present

## 2016-06-08 DIAGNOSIS — Z880 Allergy status to penicillin: Secondary | ICD-10-CM

## 2016-06-08 DIAGNOSIS — J36 Peritonsillar abscess: Principal | ICD-10-CM | POA: Diagnosis present

## 2016-06-08 DIAGNOSIS — Z841 Family history of disorders of kidney and ureter: Secondary | ICD-10-CM

## 2016-06-08 DIAGNOSIS — E0500 Thyrotoxicosis with diffuse goiter without thyrotoxic crisis or storm: Secondary | ICD-10-CM | POA: Diagnosis present

## 2016-06-08 DIAGNOSIS — I10 Essential (primary) hypertension: Secondary | ICD-10-CM | POA: Diagnosis present

## 2016-06-08 DIAGNOSIS — F329 Major depressive disorder, single episode, unspecified: Secondary | ICD-10-CM | POA: Diagnosis present

## 2016-06-08 DIAGNOSIS — M549 Dorsalgia, unspecified: Secondary | ICD-10-CM | POA: Diagnosis present

## 2016-06-08 DIAGNOSIS — F17210 Nicotine dependence, cigarettes, uncomplicated: Secondary | ICD-10-CM | POA: Diagnosis present

## 2016-06-08 DIAGNOSIS — Z6831 Body mass index (BMI) 31.0-31.9, adult: Secondary | ICD-10-CM

## 2016-06-08 DIAGNOSIS — Z881 Allergy status to other antibiotic agents status: Secondary | ICD-10-CM

## 2016-06-08 DIAGNOSIS — Z8261 Family history of arthritis: Secondary | ICD-10-CM

## 2016-06-08 DIAGNOSIS — Z825 Family history of asthma and other chronic lower respiratory diseases: Secondary | ICD-10-CM

## 2016-06-08 DIAGNOSIS — E871 Hypo-osmolality and hyponatremia: Secondary | ICD-10-CM | POA: Diagnosis present

## 2016-06-08 DIAGNOSIS — Z791 Long term (current) use of non-steroidal anti-inflammatories (NSAID): Secondary | ICD-10-CM

## 2016-06-08 DIAGNOSIS — G4733 Obstructive sleep apnea (adult) (pediatric): Secondary | ICD-10-CM | POA: Diagnosis present

## 2016-06-08 DIAGNOSIS — E119 Type 2 diabetes mellitus without complications: Secondary | ICD-10-CM

## 2016-06-08 DIAGNOSIS — Z8249 Family history of ischemic heart disease and other diseases of the circulatory system: Secondary | ICD-10-CM

## 2016-06-08 HISTORY — DX: Other diseases of pharynx: J39.2

## 2016-06-08 LAB — CBC WITH DIFFERENTIAL/PLATELET
BASOS ABS: 0 10*3/uL (ref 0.0–0.1)
BASOS PCT: 0 %
EOS ABS: 0.1 10*3/uL (ref 0.0–0.7)
EOS PCT: 0 %
HCT: 34.2 % — ABNORMAL LOW (ref 36.0–46.0)
Hemoglobin: 11.2 g/dL — ABNORMAL LOW (ref 12.0–15.0)
Lymphocytes Relative: 16 %
Lymphs Abs: 2.8 10*3/uL (ref 0.7–4.0)
MCH: 28.9 pg (ref 26.0–34.0)
MCHC: 32.7 g/dL (ref 30.0–36.0)
MCV: 88.4 fL (ref 78.0–100.0)
Monocytes Absolute: 1 10*3/uL (ref 0.1–1.0)
Monocytes Relative: 6 %
Neutro Abs: 13.9 10*3/uL — ABNORMAL HIGH (ref 1.7–7.7)
Neutrophils Relative %: 78 %
PLATELETS: 198 10*3/uL (ref 150–400)
RBC: 3.87 MIL/uL (ref 3.87–5.11)
RDW: 13.6 % (ref 11.5–15.5)
WBC: 17.8 10*3/uL — AB (ref 4.0–10.5)

## 2016-06-08 LAB — BASIC METABOLIC PANEL
Anion gap: 10 (ref 5–15)
BUN: 10 mg/dL (ref 6–20)
CALCIUM: 9.1 mg/dL (ref 8.9–10.3)
CHLORIDE: 100 mmol/L — AB (ref 101–111)
CO2: 28 mmol/L (ref 22–32)
CREATININE: 1.16 mg/dL — AB (ref 0.44–1.00)
GFR, EST AFRICAN AMERICAN: 59 mL/min — AB (ref >60)
GFR, EST NON AFRICAN AMERICAN: 51 mL/min — AB (ref >60)
Glucose, Bld: 111 mg/dL — ABNORMAL HIGH (ref 65–99)
Potassium: 4 mmol/L (ref 3.5–5.1)
SODIUM: 138 mmol/L (ref 135–145)

## 2016-06-08 MED ORDER — IOPAMIDOL (ISOVUE-300) INJECTION 61%
INTRAVENOUS | Status: AC
  Administered 2016-06-08: 75 mL
  Filled 2016-06-08: qty 75

## 2016-06-08 MED ORDER — SODIUM CHLORIDE 0.9 % IV BOLUS (SEPSIS)
1000.0000 mL | Freq: Once | INTRAVENOUS | Status: AC
  Administered 2016-06-08: 1000 mL via INTRAVENOUS

## 2016-06-08 MED ORDER — LIDOCAINE VISCOUS 2 % MT SOLN
15.0000 mL | Freq: Once | OROMUCOSAL | Status: AC
  Administered 2016-06-09: 15 mL via OROMUCOSAL
  Filled 2016-06-08: qty 15

## 2016-06-08 MED ORDER — HYDROCODONE-HOMATROPINE 5-1.5 MG/5ML PO SYRP
5.0000 mL | ORAL_SOLUTION | Freq: Once | ORAL | Status: AC
  Administered 2016-06-08: 5 mL via ORAL
  Filled 2016-06-08: qty 5

## 2016-06-08 NOTE — ED Triage Notes (Signed)
The npt is c/o difficulty swallowing that she has had for 3 days  She has been here for the same  She is afraid that she will choke during the night.she only  Has one pain pill left

## 2016-06-08 NOTE — ED Provider Notes (Signed)
[facility]-EMERGENCY DEPT Provider Note   CSN: [id number] Arrival date & time: 06/08/16  2102     History   Chief Complaint Chief Complaint  Patient presents with  . Dysphagia    HPI Vanessa [name] is a 61 y.o. female who presents with sore throat and difficulty swallowing. Past medical history significant for chronic pain syndrome, COPD, diabetes, hx of alcohol abuse, current tobacco use. She was originally seen 2 days ago by me. Exam was unremarkable and her rapid strep was negative and all her vital signs were normal and she was tolerating by mouth she was discharged home. She represented to the ED yesterday. She was started on clindamycin, given steroids, and viscous lidocaine. She has been taking these medicines until today and was unable to tolerate by mouth. Today she states the pain has gotten worse and she has had difficulty swallowing medicines or eating any food. She is afraid that she is "going to die by choking to death". She is unsure if she had a fever. She denies chest pain or shortness of breath.  HPI  Past Medical History:  Diagnosis Date  . Alcoholism (HCC)   . Allergic rhinitis   . Allergy   . Anxiety   . Arthritis    rheumatoid  . Asthma   . Back pain, chronic   . Chronic back pain 05/27/2012   Secondary to L4-5 herniated disc per patient  . Chronic headache   . COPD (chronic obstructive pulmonary disease) (HCC)   . Depression   . Diabetes mellitus   . Emphysema   . Emphysema of lung (HCC)   . Generalized anxiety disorder 05/26/2012  . GERD (gastroesophageal reflux disease)   . Grave's disease   . 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 Heart murmur   . Hernia, umbilical   . Herniated disc   . Herniated disc   . Hyperlipidemia   . Hypertension   . Menopause   . Neuropathy (HCC)   . OSA (obstructive sleep apnea)    has a cpap  . Osteoporosis   . Rheumatoid arthritis (HCC)   . Shingles     . Sleep apnea    wear c-pap  . Tear of lateral meniscus of right knee 10/13/2013  . Wears dentures    top  . Wears glasses     Patient Active Problem List   Diagnosis Date Noted  . Atypical pneumonia 12/18/2014  . Essential hypertension 12/17/2014  . COPD exacerbation (HCC) 12/16/2014  . Tear of lateral meniscus of right knee 10/13/2013  . Chest pain 07/23/2013  . GERD (gastroesophageal reflux disease) 07/23/2013  . Morbid obesity (HCC) 07/23/2013  . Renal failure (ARF), acute on chronic (HCC) 07/23/2013  . HLD (hyperlipidemia) 07/23/2013  . Dysarthria 10/05/2012  . Diarrhea 05/27/2012  . Chronic back pain 05/27/2012  . Diabetes mellitus (HCC) 05/26/2012  . Hypothyroidism 05/26/2012  . 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 Depression 05/26/2012  . Generalized anxiety disorder 05/26/2012  . OSA (obstructive sleep apnea) 03/18/2011    Past Surgical History:  Procedure Laterality Date  . CHOLECYSTECTOMY    . EYE SURGERY     eye back in socker-rt  . FOOT SURGERY     cyst rt foot  . KNEE ARTHROSCOPY WITH LATERAL MENISECTOMY Right 10/13/2013   Procedure: RIGHT KNEE ARTHROSCOPY WITH PARTIAL LATERAL MENISECTOMY/DEBRIDEMENT/SHAVING ;  Surgeon: Eulas Post, MD;  Location: [facility];  Service: Orthopedics;  Laterality: Right;  . LAPAROSCOPY     age 76  . LEFT HEART CATHETERIZATION WITH CORONARY ANGIOGRAM N/A 05/30/2012   Procedure: LEFT HEART CATHETERIZATION WITH CORONARY ANGIOGRAM;  Surgeon: [name], MD;  Location: [facility] CATH [facility];  Service: Cardiovascular;  Laterality: N/A;  . TUBAL LIGATION      OB History    Gravida Para Term Preterm AB Living   5 3     2 3    SAB TAB Ectopic Multiple Live Births   2               Home Medications    Prior to Admission medications   Medication Sig Start Date End Date Taking? Authorizing Provider  albuterol (PROAIR HFA) 108 (90 BASE) MCG/ACT inhaler Inhale 2 puffs into the lungs 4 (four) times daily.  For COPD   Yes Historical Provider, MD  atorvastatin (LIPITOR) 40 MG tablet Take 1 tablet (40 mg total) by mouth daily. 10/06/12  Yes Gust Rung, DO  busPIRone (BUSPAR) 10 MG tablet Take 20 mg by mouth 2 (two) times daily.    Yes Historical Provider, MD  clindamycin (CLEOCIN) 150 MG capsule Take 2 capsules (300 mg total) by mouth 3 (three) times daily. 06/07/16  Yes Jaime Pilcher Ward, PA-C  cycloSPORINE (RESTASIS) 0.05 % ophthalmic emulsion Place 1 drop into both eyes 2 (two) times daily.   Yes Historical Provider, MD  FLUoxetine (PROZAC) 20 MG capsule Take 60 mg by mouth every morning.    Yes Historical Provider, MD  glipiZIDE (GLUCOTROL) 10 MG tablet Take 10 mg by mouth daily before breakfast.    Yes Historical Provider, MD  HYDROcodone-acetaminophen (NORCO) 10-325 MG tablet Take 0.5-1 tablets by mouth every 6 (six) hours as needed for severe pain.    Yes Historical Provider, MD  ibuprofen (ADVIL,MOTRIN) 200 MG tablet Take 400 mg by mouth every 6 (six) hours as needed (for pain).   Yes Historical Provider, MD  ipratropium-albuterol (DUONEB) 0.5-2.5 (3) MG/3ML SOLN Take 3 mLs by nebulization every 6 (six) hours as needed (for wheezing or shortness of breath).    Yes Historical Provider, MD  levothyroxine (SYNTHROID, LEVOTHROID) 175 MCG tablet Take 1 tablet (175 mcg total) by mouth every morning. 03/10/16  Yes Audry Pili, PA-C  lidocaine (XYLOCAINE) 2 % solution Use as directed 15 mLs in the mouth or throat as needed for mouth pain. 06/07/16  Yes Jaime Pilcher Ward, PA-C  lisinopril (PRINIVIL,ZESTRIL) 10 MG tablet Take 1 tablet (10 mg total) by mouth daily. 03/10/16  Yes Audry Pili, PA-C  loratadine (CLARITIN) 10 MG tablet Take 10 mg by mouth daily.    Yes Historical Provider, MD  meloxicam (MOBIC) 15 MG tablet Take 15 mg by mouth daily.   Yes Historical Provider, MD  metFORMIN (GLUCOPHAGE) 1000 MG tablet Take 1 tablet (1,000 mg total) by mouth 2 (two) times daily. Patient taking differently: Take  1,000 mg by mouth daily with breakfast.  03/10/16  Yes Audry Pili, PA-C  mirtazapine (REMERON) 45 MG tablet Take 45 mg by mouth at bedtime as needed (for depression).    Yes Historical Provider, MD  Pseudoeph-Doxylamine-DM-APAP 60-12.08-26-998 MG/30ML LIQD Take 30 mLs by mouth at bedtime.   Yes Historical Provider, MD  QUEtiapine (SEROQUEL) 50 MG tablet Take 50 mg by mouth at bedtime.   Yes Historical Provider, MD  tiotropium (SPIRIVA) 18 MCG inhalation capsule Place 1 capsule (18 mcg total) into inhaler and inhale daily. 12/18/14  Yes Valentino Nose, MD  verapamil Etter Sjogren)  40 MG tablet Take 40 mg by mouth 3 (three) times daily.   Yes Historical Provider, MD  promethazine-dextromethorphan (PROMETHAZINE-DM) 6.25-15 MG/5ML syrup Take 5 mLs by mouth 4 (four) times daily as needed for cough. Patient not taking: Reported on 06/08/2016 04/14/16   Shaune Pollack, MD  sennosides-docusate sodium (SENOKOT-S) 8.6-50 MG tablet Take 2 tablets by mouth daily. Patient taking differently: Take 2 tablets by mouth daily as needed for constipation.  10/13/13   Teryl Lucy, MD    Family History Family History  Problem Relation Age of Onset  . Emphysema Mother   . Allergies Mother   . Asthma Mother   . Heart disease Mother   . Rheum arthritis Mother   . Diabetes Mother   . Kidney disease Mother   . Allergies Daughter   . Asthma Brother   . Breast cancer Other     aunt  . Lung cancer Brother   . Throat cancer Brother   . Colon cancer Neg Hx   . Colon polyps Neg Hx   . Esophageal cancer Neg Hx   . Rectal cancer Neg Hx   . Stomach cancer Neg Hx     Social History Social History  Substance Use Topics  . Smoking status: Current Every Day Smoker    Packs/day: 1.00    Years: 40.00    Types: Cigarettes  . Smokeless tobacco: Never Used  . Alcohol use No     Allergies   Suboxone [buprenorphine hcl-naloxone hcl]; Ciprofloxacin; and Penicillins   Review of Systems Review of Systems  Constitutional:  Negative for chills and fever.  HENT: Positive for ear pain, sore throat, trouble swallowing and voice change.   Respiratory: Negative for shortness of breath.   Cardiovascular: Negative for chest pain.  Neurological: Positive for headaches.  All other systems reviewed and are negative.    Physical Exam Updated Vital Signs BP 101/63 (BP Location: Right Arm)   Pulse 107   Temp 99.1 F (37.3 C) (Oral)   Resp 20   Ht 5\' 9"  (1.753 m)   Wt 100.4 kg   SpO2 100%   BMI 32.68 kg/m   Physical Exam  Constitutional: She is oriented to person, place, and time. She appears well-developed and well-nourished. No distress.  Appears fatigued. Hoarse voice noted  HENT:  Head: Normocephalic and atraumatic.  Mouth/Throat: Tonsillar abscesses (Right side) present. No oropharyngeal exudate or posterior oropharyngeal erythema.  Eyes: Conjunctivae are normal. Pupils are equal, round, and reactive to light. Right eye exhibits no discharge. Left eye exhibits no discharge. No scleral icterus.  Neck: Normal range of motion.  Cardiovascular: Normal rate.   Pulmonary/Chest: Effort normal. No respiratory distress.  Abdominal: She exhibits no distension.  Lymphadenopathy:    She has cervical adenopathy (on right side).  Neurological: She is alert and oriented to person, place, and time.  Skin: Skin is warm and dry.  Psychiatric: She has a normal mood and affect. Her behavior is normal.  Nursing note and vitals reviewed.    ED Treatments / Results  Labs (all labs ordered are listed, but only abnormal results are displayed) Labs Reviewed  BASIC METABOLIC PANEL - Abnormal; Notable for the following:       Result Value   Chloride 100 (*)    Glucose, Bld 111 (*)    Creatinine, Ser 1.16 (*)    GFR calc non Af Amer 51 (*)    GFR calc Af Amer 59 (*)    All other components within  normal limits  CBC WITH DIFFERENTIAL/PLATELET - Abnormal; Notable for the following:    WBC 17.8 (*)    Hemoglobin 11.2 (*)     HCT 34.2 (*)    Neutro Abs 13.9 (*)    All other components within normal limits  MAGNESIUM - Abnormal; Notable for the following:    Magnesium 1.6 (*)    All other components within normal limits  COMPREHENSIVE METABOLIC PANEL - Abnormal; Notable for the following:    Chloride 100 (*)    Glucose, Bld 246 (*)    Calcium 8.8 (*)    Total Protein 6.3 (*)    All other components within normal limits  CBC - Abnormal; Notable for the following:    WBC 18.6 (*)    Hemoglobin 11.2 (*)    HCT 34.1 (*)    All other components within normal limits  GLUCOSE, CAPILLARY - Abnormal; Notable for the following:    Glucose-Capillary 161 (*)    All other components within normal limits  GLUCOSE, CAPILLARY - Abnormal; Notable for the following:    Glucose-Capillary 140 (*)    All other components within normal limits  RESPIRATORY PANEL BY PCR  SURGICAL PCR SCREEN  TROPONIN I  TROPONIN I  TROPONIN I  HIV ANTIBODY (ROUTINE TESTING)  PHOSPHORUS  TSH  HEMOGLOBIN A1C  SURGICAL PATHOLOGY    EKG  EKG Interpretation None       Radiology Ct Soft Tissue Neck W Contrast  Result Date: 06/09/2016 CLINICAL DATA:  Sore throat. History of diabetes, COPD, grave's disease. EXAM: CT NECK WITH CONTRAST TECHNIQUE: Multidetector CT imaging of the neck was performed using the standard protocol following the bolus administration of intravenous contrast. CONTRAST:  61mL ISOVUE-300 IOPAMIDOL (ISOVUE-300) INJECTION 61% COMPARISON:  Cervical spine radiographs April 14, 2015 FINDINGS: PHARYNX AND LARYNX: 2 x 1.2 x 3.3 cm (transverse by AP by CC) ill-defined cystic and solid mass RIGHT palatine tonsil extending to the base of tongue. No superimposed findings of acute tonsillitis. Mild RIGHT parapharyngeal fat stranding. Edema and mass effect extending to RIGHT hypopharynx with medial deviation RIGHT cricoarytenoid joint. However, symmetric piriform sinuses and normal larynx. SALIVARY GLANDS: Normal. THYROID: Trace  residual thyroid tissue. LYMPH NODES: No lymphadenopathy by CT size criteria. However, 7 mm retropharyngeal lymph node. 8 mm short access RIGHT level IIa lymph node. VASCULAR: Normal. LIMITED INTRACRANIAL: Normal. VISUALIZED ORBITS: Nonacute. Old LEFT orbital floor blowout fracture. MASTOIDS AND VISUALIZED PARANASAL SINUSES: Well-aerated. SKELETON: Nonacute. Patient is edentulous. Mild degenerative change of the cervical spine. UPPER CHEST: Lung apices are clear. No superior mediastinal lymphadenopathy. OTHER: None. IMPRESSION: Cystic and solid 2 x 1.2 x 3.3 cm RIGHT palatine tonsil to base of tongue mass, this may be simple abscess though superinfected neoplasm or, necrotic neoplasm is suspected (including 7 mm RIGHT retropharyngeal lymph node). Patent airway. Recommend follow-up ENT consultation after treatment. Small amount of residual thyroid tissue. Acute findings discussed with and reconfirmed by PA Radley Barto on 06/08/2016 at 11:56 pm. Electronically Signed   By: Awilda Metro M.D.   On: 06/09/2016 00:00    Procedures Procedures (including critical care time)  Medications Ordered in ED Medications  lidocaine (XYLOCAINE) 2 % viscous mouth solution 15 mL (not administered)  HYDROmorphone (DILAUDID) injection 0.5 mg (not administered)  dexamethasone (DECADRON) injection 10 mg (not administered)  clindamycin (CLEOCIN) IVPB 600 mg (not administered)  HYDROcodone-homatropine (HYCODAN) 5-1.5 MG/5ML syrup 5 mL (5 mLs Oral Given 06/08/16 2224)  sodium chloride 0.9 % bolus 1,000 mL (1,000  mLs Intravenous New Bag/Given 06/08/16 2222)  iopamidol (ISOVUE-300) 61 % injection (75 mLs  Contrast Given 06/08/16 2326)     Initial Impression / Assessment and Plan / ED Course  I have reviewed the triage vital signs and the nursing notes.  Pertinent labs & imaging results that were available during my care of the patient were reviewed by me and considered in my medical decision making (see chart for  details).  61 year old female presents with necrotic mass with superimposed infection. She is tachycardic but vitals are otherwise normal. Exam is now remarkable for right sided peritonsillar swelling. Started pt on antibiotics, steroids, fluids, and pain medicine. Labs remarkable for worsening leukocytosis, mild anemia. BMP unremarkable. Shared visit with Dr. Penne Lash.   Spoke with Dr. Lazarus Salines with ENT who will see pt in consult. Spoke with Dr. Adela Glimpse who will admit patient for observation.  Final Clinical Impressions(s) / ED Diagnoses   Final diagnoses:  Pharyngeal mass  Sore throat    New Prescriptions New Prescriptions   No medications on file     Bethel Born, PA-C 06/09/16 1659    Shaune Pollack, MD 06/10/16 1145

## 2016-06-08 NOTE — ED Notes (Signed)
Patient transported to CT 

## 2016-06-09 ENCOUNTER — Encounter (HOSPITAL_COMMUNITY): Payer: Self-pay | Admitting: Internal Medicine

## 2016-06-09 ENCOUNTER — Inpatient Hospital Stay (HOSPITAL_COMMUNITY): Payer: Medicaid Other | Admitting: Certified Registered Nurse Anesthetist

## 2016-06-09 ENCOUNTER — Encounter (HOSPITAL_COMMUNITY)
Admission: EM | Disposition: A | Discharge status: Home / Self Care | Payer: Self-pay | Source: Home / Self Care | Attending: Internal Medicine

## 2016-06-09 DIAGNOSIS — F1011 Alcohol abuse, in remission: Secondary | ICD-10-CM

## 2016-06-09 DIAGNOSIS — E871 Hypo-osmolality and hyponatremia: Secondary | ICD-10-CM | POA: Diagnosis present

## 2016-06-09 DIAGNOSIS — K219 Gastro-esophageal reflux disease without esophagitis: Secondary | ICD-10-CM | POA: Diagnosis present

## 2016-06-09 DIAGNOSIS — E0500 Thyrotoxicosis with diffuse goiter without thyrotoxic crisis or storm: Secondary | ICD-10-CM

## 2016-06-09 DIAGNOSIS — Z7984 Long term (current) use of oral hypoglycemic drugs: Secondary | ICD-10-CM | POA: Diagnosis not present

## 2016-06-09 DIAGNOSIS — E1142 Type 2 diabetes mellitus with diabetic polyneuropathy: Secondary | ICD-10-CM

## 2016-06-09 DIAGNOSIS — J36 Peritonsillar abscess: Secondary | ICD-10-CM | POA: Diagnosis present

## 2016-06-09 DIAGNOSIS — F17210 Nicotine dependence, cigarettes, uncomplicated: Secondary | ICD-10-CM | POA: Diagnosis present

## 2016-06-09 DIAGNOSIS — G8929 Other chronic pain: Secondary | ICD-10-CM | POA: Diagnosis present

## 2016-06-09 DIAGNOSIS — Z833 Family history of diabetes mellitus: Secondary | ICD-10-CM | POA: Diagnosis not present

## 2016-06-09 DIAGNOSIS — Z8249 Family history of ischemic heart disease and other diseases of the circulatory system: Secondary | ICD-10-CM | POA: Diagnosis not present

## 2016-06-09 DIAGNOSIS — M549 Dorsalgia, unspecified: Secondary | ICD-10-CM | POA: Diagnosis present

## 2016-06-09 DIAGNOSIS — F1010 Alcohol abuse, uncomplicated: Secondary | ICD-10-CM | POA: Insufficient documentation

## 2016-06-09 DIAGNOSIS — Z791 Long term (current) use of non-steroidal anti-inflammatories (NSAID): Secondary | ICD-10-CM | POA: Diagnosis not present

## 2016-06-09 DIAGNOSIS — Z825 Family history of asthma and other chronic lower respiratory diseases: Secondary | ICD-10-CM | POA: Diagnosis not present

## 2016-06-09 DIAGNOSIS — Z6831 Body mass index (BMI) 31.0-31.9, adult: Secondary | ICD-10-CM | POA: Diagnosis not present

## 2016-06-09 DIAGNOSIS — I10 Essential (primary) hypertension: Secondary | ICD-10-CM | POA: Diagnosis present

## 2016-06-09 DIAGNOSIS — E119 Type 2 diabetes mellitus without complications: Secondary | ICD-10-CM | POA: Diagnosis present

## 2016-06-09 DIAGNOSIS — G4733 Obstructive sleep apnea (adult) (pediatric): Secondary | ICD-10-CM

## 2016-06-09 DIAGNOSIS — Z801 Family history of malignant neoplasm of trachea, bronchus and lung: Secondary | ICD-10-CM | POA: Diagnosis not present

## 2016-06-09 DIAGNOSIS — Z880 Allergy status to penicillin: Secondary | ICD-10-CM | POA: Diagnosis not present

## 2016-06-09 DIAGNOSIS — Z881 Allergy status to other antibiotic agents status: Secondary | ICD-10-CM | POA: Diagnosis not present

## 2016-06-09 DIAGNOSIS — Z79899 Other long term (current) drug therapy: Secondary | ICD-10-CM | POA: Diagnosis not present

## 2016-06-09 DIAGNOSIS — J029 Acute pharyngitis, unspecified: Secondary | ICD-10-CM | POA: Diagnosis not present

## 2016-06-09 DIAGNOSIS — J392 Other diseases of pharynx: Secondary | ICD-10-CM | POA: Diagnosis not present

## 2016-06-09 DIAGNOSIS — F329 Major depressive disorder, single episode, unspecified: Secondary | ICD-10-CM | POA: Diagnosis present

## 2016-06-09 DIAGNOSIS — J449 Chronic obstructive pulmonary disease, unspecified: Secondary | ICD-10-CM | POA: Diagnosis present

## 2016-06-09 HISTORY — PX: TONSILLECTOMY AND ADENOIDECTOMY: SHX28

## 2016-06-09 HISTORY — PX: INCISION AND DRAINAGE OF PERITONSILLAR ABCESS: SHX6257

## 2016-06-09 LAB — RESPIRATORY PANEL BY PCR
ADENOVIRUS-RVPPCR: NOT DETECTED
BORDETELLA PERTUSSIS-RVPCR: NOT DETECTED
CHLAMYDOPHILA PNEUMONIAE-RVPPCR: NOT DETECTED
CORONAVIRUS HKU1-RVPPCR: NOT DETECTED
CORONAVIRUS NL63-RVPPCR: NOT DETECTED
Coronavirus 229E: NOT DETECTED
Coronavirus OC43: NOT DETECTED
Influenza A: NOT DETECTED
Influenza B: NOT DETECTED
MYCOPLASMA PNEUMONIAE-RVPPCR: NOT DETECTED
Metapneumovirus: NOT DETECTED
Parainfluenza Virus 1: NOT DETECTED
Parainfluenza Virus 2: NOT DETECTED
Parainfluenza Virus 3: NOT DETECTED
Parainfluenza Virus 4: NOT DETECTED
RHINOVIRUS / ENTEROVIRUS - RVPPCR: NOT DETECTED
Respiratory Syncytial Virus: NOT DETECTED

## 2016-06-09 LAB — COMPREHENSIVE METABOLIC PANEL
ALT: 19 U/L (ref 14–54)
AST: 17 U/L (ref 15–41)
Albumin: 3.7 g/dL (ref 3.5–5.0)
Alkaline Phosphatase: 45 U/L (ref 38–126)
Anion gap: 10 (ref 5–15)
BUN: 11 mg/dL (ref 6–20)
CHLORIDE: 100 mmol/L — AB (ref 101–111)
CO2: 25 mmol/L (ref 22–32)
CREATININE: 0.95 mg/dL (ref 0.44–1.00)
Calcium: 8.8 mg/dL — ABNORMAL LOW (ref 8.9–10.3)
GFR calc non Af Amer: >60 mL/min (ref >60)
Glucose, Bld: 246 mg/dL — ABNORMAL HIGH (ref 65–99)
Potassium: 4.8 mmol/L (ref 3.5–5.1)
SODIUM: 135 mmol/L (ref 135–145)
Total Bilirubin: 0.3 mg/dL (ref 0.3–1.2)
Total Protein: 6.3 g/dL — ABNORMAL LOW (ref 6.5–8.1)

## 2016-06-09 LAB — CBC
HEMATOCRIT: 34.1 % — AB (ref 36.0–46.0)
HEMOGLOBIN: 11.2 g/dL — AB (ref 12.0–15.0)
MCH: 28.9 pg (ref 26.0–34.0)
MCHC: 32.8 g/dL (ref 30.0–36.0)
MCV: 87.9 fL (ref 78.0–100.0)
PLATELETS: 210 10*3/uL (ref 150–400)
RBC: 3.88 MIL/uL (ref 3.87–5.11)
RDW: 13.7 % (ref 11.5–15.5)
WBC: 18.6 10*3/uL — ABNORMAL HIGH (ref 4.0–10.5)

## 2016-06-09 LAB — PHOSPHORUS: Phosphorus: 3.3 mg/dL (ref 2.5–4.6)

## 2016-06-09 LAB — GLUCOSE, CAPILLARY
GLUCOSE-CAPILLARY: 140 mg/dL — AB (ref 65–99)
GLUCOSE-CAPILLARY: 154 mg/dL — AB (ref 65–99)
Glucose-Capillary: 161 mg/dL — ABNORMAL HIGH (ref 65–99)
Glucose-Capillary: 135 mg/dL — ABNORMAL HIGH (ref 65–99)

## 2016-06-09 LAB — MAGNESIUM: Magnesium: 1.6 mg/dL — ABNORMAL LOW (ref 1.7–2.4)

## 2016-06-09 LAB — CULTURE, GROUP A STREP (THRC)

## 2016-06-09 LAB — TROPONIN I

## 2016-06-09 LAB — HIV ANTIBODY (ROUTINE TESTING W REFLEX): HIV SCREEN 4TH GENERATION: NONREACTIVE

## 2016-06-09 LAB — TSH: TSH: 2.008 u[IU]/mL (ref 0.350–4.500)

## 2016-06-09 LAB — SURGICAL PCR SCREEN
MRSA, PCR: NEGATIVE
STAPHYLOCOCCUS AUREUS: NEGATIVE

## 2016-06-09 SURGERY — TONSILLECTOMY AND ADENOIDECTOMY
Anesthesia: General | Laterality: Right

## 2016-06-09 MED ORDER — FENTANYL CITRATE (PF) 100 MCG/2ML IJ SOLN
INTRAMUSCULAR | Status: DC | PRN
  Administered 2016-06-09: 150 ug via INTRAVENOUS
  Administered 2016-06-09 (×2): 50 ug via INTRAVENOUS

## 2016-06-09 MED ORDER — LIDOCAINE HCL 4 % EX SOLN: 0.0000 mL | Freq: Once | CUTANEOUS | Status: DC | PRN

## 2016-06-09 MED ORDER — VERAPAMIL HCL 40 MG PO TABS
40.0000 mg | ORAL_TABLET | Freq: Three times a day (TID) | ORAL | Status: DC
  Administered 2016-06-09 – 2016-06-12 (×9): 40 mg via ORAL
  Filled 2016-06-09 (×11): qty 1

## 2016-06-09 MED ORDER — SODIUM CHLORIDE 0.9 % IV SOLN
INTRAVENOUS | Status: AC
  Administered 2016-06-09: 12:00:00 via INTRAVENOUS

## 2016-06-09 MED ORDER — LIDOCAINE HCL 4 % EX SOLN
CUTANEOUS | Status: DC | PRN
  Administered 2016-06-09: 4 mL via TOPICAL

## 2016-06-09 MED ORDER — LEVOTHYROXINE SODIUM 100 MCG IV SOLR
87.5000 ug | Freq: Every day | INTRAVENOUS | Status: DC
  Administered 2016-06-09 – 2016-06-12 (×4): 87.5 ug via INTRAVENOUS
  Filled 2016-06-09 (×4): qty 5

## 2016-06-09 MED ORDER — MAGNESIUM SULFATE 2 GM/50ML IV SOLN
2.0000 g | Freq: Once | INTRAVENOUS | Status: AC
  Administered 2016-06-09: 2 g via INTRAVENOUS
  Filled 2016-06-09: qty 50

## 2016-06-09 MED ORDER — LIDOCAINE HCL (CARDIAC) 20 MG/ML IV SOLN
INTRAVENOUS | Status: DC | PRN
  Administered 2016-06-09: 100 mg via INTRAVENOUS

## 2016-06-09 MED ORDER — 0.9 % SODIUM CHLORIDE (POUR BTL) OPTIME
TOPICAL | Status: DC | PRN
  Administered 2016-06-09: 1000 mL

## 2016-06-09 MED ORDER — ONDANSETRON HCL 4 MG PO TABS
4.0000 mg | ORAL_TABLET | Freq: Four times a day (QID) | ORAL | Status: DC | PRN
Start: 2016-06-09 — End: 2016-06-12

## 2016-06-09 MED ORDER — HYDROMORPHONE HCL 2 MG/ML IJ SOLN
0.5000 mg | Freq: Once | INTRAMUSCULAR | Status: AC
  Administered 2016-06-09: 0.5 mg via INTRAVENOUS
  Filled 2016-06-09: qty 1

## 2016-06-09 MED ORDER — ONDANSETRON HCL 4 MG/2ML IJ SOLN: 4.0000 mg | Freq: Once | INTRAMUSCULAR | Status: DC | PRN

## 2016-06-09 MED ORDER — CLINDAMYCIN PHOSPHATE 600 MG/50ML IV SOLN
600.0000 mg | Freq: Once | INTRAVENOUS | Status: AC
  Administered 2016-06-09: 600 mg via INTRAVENOUS
  Filled 2016-06-09: qty 50

## 2016-06-09 MED ORDER — DEXAMETHASONE SODIUM PHOSPHATE 10 MG/ML IJ SOLN
10.0000 mg | Freq: Once | INTRAMUSCULAR | Status: AC
  Administered 2016-06-09: 10 mg via INTRAVENOUS
  Filled 2016-06-09: qty 1

## 2016-06-09 MED ORDER — PROPOFOL 10 MG/ML IV BOLUS
INTRAVENOUS | Status: AC
  Filled 2016-06-09: qty 20

## 2016-06-09 MED ORDER — HYDROMORPHONE HCL 2 MG/ML IJ SOLN: 1.0000 mg | Freq: Once | INTRAMUSCULAR | Status: DC

## 2016-06-09 MED ORDER — PHENYLEPHRINE 40 MCG/ML (10ML) SYRINGE FOR IV PUSH (FOR BLOOD PRESSURE SUPPORT)
PREFILLED_SYRINGE | INTRAVENOUS | Status: AC
  Filled 2016-06-09: qty 10

## 2016-06-09 MED ORDER — OXYMETAZOLINE HCL 0.05 % NA SOLN: 1.0000 | Freq: Once | NASAL | Status: DC | PRN

## 2016-06-09 MED ORDER — ALBUTEROL SULFATE HFA 108 (90 BASE) MCG/ACT IN AERS
INHALATION_SPRAY | RESPIRATORY_TRACT | Status: DC | PRN
  Administered 2016-06-09 (×2): 4 via RESPIRATORY_TRACT

## 2016-06-09 MED ORDER — LACTATED RINGERS IV SOLN
INTRAVENOUS | Status: DC | PRN
  Administered 2016-06-09: 16:00:00 via INTRAVENOUS

## 2016-06-09 MED ORDER — DEXAMETHASONE SODIUM PHOSPHATE 10 MG/ML IJ SOLN
INTRAMUSCULAR | Status: DC | PRN
  Administered 2016-06-09: 5 mg via INTRAVENOUS

## 2016-06-09 MED ORDER — HYDROCODONE-ACETAMINOPHEN 7.5-325 MG/15ML PO SOLN
10.0000 mL | ORAL | Status: DC | PRN
  Administered 2016-06-09 – 2016-06-10 (×5): 20 mL via ORAL
  Filled 2016-06-09 (×5): qty 30

## 2016-06-09 MED ORDER — IPRATROPIUM BROMIDE 0.02 % IN SOLN: 0.5000 mg | Freq: Four times a day (QID) | RESPIRATORY_TRACT | Status: DC

## 2016-06-09 MED ORDER — GABAPENTIN 250 MG/5ML PO SOLN
300.0000 mg | Freq: Three times a day (TID) | ORAL | Status: DC
  Administered 2016-06-09 – 2016-06-12 (×8): 300 mg via ORAL
  Filled 2016-06-09 (×10): qty 6

## 2016-06-09 MED ORDER — LIDOCAINE HCL 2 % EX GEL
1.0000 "application " | Freq: Once | CUTANEOUS | Status: AC
  Administered 2016-06-09: 1 via TOPICAL
  Filled 2016-06-09: qty 5

## 2016-06-09 MED ORDER — LACTATED RINGERS IV SOLN
Freq: Once | INTRAVENOUS | Status: AC
  Administered 2016-06-09: 15:00:00 via INTRAVENOUS

## 2016-06-09 MED ORDER — MIDAZOLAM HCL 5 MG/5ML IJ SOLN
INTRAMUSCULAR | Status: DC | PRN
  Administered 2016-06-09 (×2): 1 mg via INTRAVENOUS

## 2016-06-09 MED ORDER — CLINDAMYCIN PHOSPHATE 600 MG/50ML IV SOLN: 600.0000 mg | Freq: Four times a day (QID) | INTRAVENOUS | Status: DC

## 2016-06-09 MED ORDER — HYDROMORPHONE HCL 1 MG/ML IJ SOLN
INTRAMUSCULAR | Status: AC
  Filled 2016-06-09: qty 0.5

## 2016-06-09 MED ORDER — CYCLOSPORINE 0.05 % OP EMUL
1.0000 [drp] | Freq: Two times a day (BID) | OPHTHALMIC | Status: DC
  Administered 2016-06-09 – 2016-06-12 (×7): 1 [drp] via OPHTHALMIC
  Filled 2016-06-09 (×7): qty 1

## 2016-06-09 MED ORDER — SUGAMMADEX SODIUM 200 MG/2ML IV SOLN
INTRAVENOUS | Status: AC
  Filled 2016-06-09: qty 2

## 2016-06-09 MED ORDER — BUSPIRONE HCL 10 MG PO TABS
20.0000 mg | ORAL_TABLET | Freq: Two times a day (BID) | ORAL | Status: DC
  Administered 2016-06-10 – 2016-06-12 (×5): 20 mg via ORAL
  Filled 2016-06-09 (×5): qty 2

## 2016-06-09 MED ORDER — QUETIAPINE FUMARATE 25 MG PO TABS
50.0000 mg | ORAL_TABLET | Freq: Every day | ORAL | Status: DC
  Administered 2016-06-10 – 2016-06-11 (×2): 50 mg via ORAL
  Filled 2016-06-09 (×2): qty 2

## 2016-06-09 MED ORDER — TRIPLE ANTIBIOTIC 3.5-400-5000 EX OINT
1.0000 "application " | TOPICAL_OINTMENT | Freq: Once | CUTANEOUS | Status: DC | PRN
  Filled 2016-06-09: qty 1

## 2016-06-09 MED ORDER — CLINDAMYCIN PHOSPHATE 600 MG/50ML IV SOLN
600.0000 mg | Freq: Three times a day (TID) | INTRAVENOUS | Status: DC
  Administered 2016-06-09 – 2016-06-12 (×11): 600 mg via INTRAVENOUS
  Filled 2016-06-09 (×10): qty 50

## 2016-06-09 MED ORDER — PROPOFOL 10 MG/ML IV BOLUS
INTRAVENOUS | Status: DC | PRN
  Administered 2016-06-09: 150 mg via INTRAVENOUS

## 2016-06-09 MED ORDER — ONDANSETRON HCL 4 MG/2ML IJ SOLN
INTRAMUSCULAR | Status: AC
  Filled 2016-06-09: qty 2

## 2016-06-09 MED ORDER — ACETAMINOPHEN 650 MG RE SUPP: 650.0000 mg | Freq: Four times a day (QID) | RECTAL | Status: DC | PRN

## 2016-06-09 MED ORDER — MEPERIDINE HCL 25 MG/ML IJ SOLN: 6.2500 mg | INTRAMUSCULAR | Status: DC | PRN

## 2016-06-09 MED ORDER — SILVER NITRATE-POT NITRATE 75-25 % EX MISC
1.0000 | Freq: Once | CUTANEOUS | Status: DC | PRN
  Filled 2016-06-09: qty 1

## 2016-06-09 MED ORDER — SODIUM CHLORIDE 0.9% FLUSH
3.0000 mL | Freq: Two times a day (BID) | INTRAVENOUS | Status: DC
  Administered 2016-06-10 – 2016-06-12 (×3): 3 mL via INTRAVENOUS

## 2016-06-09 MED ORDER — ONDANSETRON HCL 4 MG/2ML IJ SOLN
INTRAMUSCULAR | Status: DC | PRN
  Administered 2016-06-09: 4 mg via INTRAVENOUS

## 2016-06-09 MED ORDER — LIDOCAINE-EPINEPHRINE (PF) 1 %-1:200000 IJ SOLN
0.0000 mL | Freq: Once | INTRAMUSCULAR | Status: AC | PRN
  Administered 2016-06-09: 30 mL via INTRADERMAL
  Filled 2016-06-09: qty 30

## 2016-06-09 MED ORDER — FENTANYL CITRATE (PF) 100 MCG/2ML IJ SOLN
INTRAMUSCULAR | Status: AC
  Filled 2016-06-09: qty 4

## 2016-06-09 MED ORDER — CLINDAMYCIN PHOSPHATE 900 MG/50ML IV SOLN
INTRAVENOUS | Status: AC
  Filled 2016-06-09: qty 50

## 2016-06-09 MED ORDER — CLINDAMYCIN PHOSPHATE 600 MG/50ML IV SOLN: 600.0000 mg | Freq: Once | INTRAVENOUS | Status: DC

## 2016-06-09 MED ORDER — DEXAMETHASONE SODIUM PHOSPHATE 10 MG/ML IJ SOLN
INTRAMUSCULAR | Status: AC
  Filled 2016-06-09: qty 1

## 2016-06-09 MED ORDER — SUCCINYLCHOLINE CHLORIDE 20 MG/ML IJ SOLN
INTRAMUSCULAR | Status: DC | PRN
  Administered 2016-06-09: 120 mg via INTRAVENOUS

## 2016-06-09 MED ORDER — ATORVASTATIN CALCIUM 40 MG PO TABS
40.0000 mg | ORAL_TABLET | Freq: Every day | ORAL | Status: DC
  Administered 2016-06-09 – 2016-06-11 (×3): 40 mg via ORAL
  Filled 2016-06-09 (×3): qty 1

## 2016-06-09 MED ORDER — FENTANYL CITRATE (PF) 100 MCG/2ML IJ SOLN
INTRAMUSCULAR | Status: AC
  Filled 2016-06-09: qty 2

## 2016-06-09 MED ORDER — SUGAMMADEX SODIUM 200 MG/2ML IV SOLN
INTRAVENOUS | Status: DC | PRN
  Administered 2016-06-09: 400 mg via INTRAVENOUS

## 2016-06-09 MED ORDER — INSULIN ASPART 100 UNIT/ML ~~LOC~~ SOLN
0.0000 [IU] | SUBCUTANEOUS | Status: DC
  Administered 2016-06-09: 2 [IU] via SUBCUTANEOUS
  Administered 2016-06-09 – 2016-06-10 (×3): 1 [IU] via SUBCUTANEOUS
  Administered 2016-06-10: 2 [IU] via SUBCUTANEOUS
  Administered 2016-06-10: 1 [IU] via SUBCUTANEOUS
  Administered 2016-06-11 (×2): 2 [IU] via SUBCUTANEOUS
  Administered 2016-06-12: 1 [IU] via SUBCUTANEOUS

## 2016-06-09 MED ORDER — HYDROMORPHONE HCL 1 MG/ML IJ SOLN
0.2500 mg | INTRAMUSCULAR | Status: DC | PRN
  Administered 2016-06-09: 0.5 mg via INTRAVENOUS

## 2016-06-09 MED ORDER — DEXAMETHASONE SODIUM PHOSPHATE 10 MG/ML IJ SOLN
4.0000 mg | Freq: Four times a day (QID) | INTRAMUSCULAR | Status: DC
  Administered 2016-06-09: 4 mg via INTRAVENOUS
  Filled 2016-06-09 (×5): qty 1

## 2016-06-09 MED ORDER — MORPHINE SULFATE (PF) 4 MG/ML IV SOLN
2.0000 mg | INTRAVENOUS | Status: DC | PRN
  Administered 2016-06-09 – 2016-06-10 (×6): 2 mg via INTRAVENOUS
  Filled 2016-06-09 (×7): qty 1

## 2016-06-09 MED ORDER — LIDOCAINE HCL 2 % EX GEL
1.0000 "application " | Freq: Once | CUTANEOUS | Status: DC | PRN
  Filled 2016-06-09: qty 5

## 2016-06-09 MED ORDER — ACETAMINOPHEN 325 MG PO TABS: 650.0000 mg | ORAL_TABLET | Freq: Four times a day (QID) | ORAL | Status: DC | PRN

## 2016-06-09 MED ORDER — ROCURONIUM BROMIDE 100 MG/10ML IV SOLN
INTRAVENOUS | Status: DC | PRN
  Administered 2016-06-09: 30 mg via INTRAVENOUS

## 2016-06-09 MED ORDER — ALBUTEROL SULFATE (2.5 MG/3ML) 0.083% IN NEBU: 2.5000 mg | INHALATION_SOLUTION | RESPIRATORY_TRACT | Status: DC | PRN

## 2016-06-09 MED ORDER — LIDOCAINE HCL (PF) 0.5 % IJ SOLN
INTRAMUSCULAR | Status: AC
  Filled 2016-06-09: qty 50

## 2016-06-09 MED ORDER — ONDANSETRON HCL 4 MG/2ML IJ SOLN
4.0000 mg | Freq: Four times a day (QID) | INTRAMUSCULAR | Status: DC | PRN
  Administered 2016-06-09: 4 mg via INTRAVENOUS
  Filled 2016-06-09 (×2): qty 2

## 2016-06-09 MED ORDER — MIDAZOLAM HCL 2 MG/2ML IJ SOLN
INTRAMUSCULAR | Status: AC
  Filled 2016-06-09: qty 2

## 2016-06-09 SURGICAL SUPPLY — 42 items
BLADE SURG 15 STRL LF DISP TIS (BLADE) IMPLANT
BLADE SURG 15 STRL SS (BLADE)
CANISTER SUCT 3000ML PPV (MISCELLANEOUS) ×2 IMPLANT
CATH ROBINSON RED A/P 10FR (CATHETERS) IMPLANT
CLEANER TIP ELECTROSURG 2X2 (MISCELLANEOUS) ×2 IMPLANT
COAGULATOR SUCT SWTCH 10FR 6 (ELECTROSURGICAL) ×2 IMPLANT
CRADLE DONUT ADULT HEAD (MISCELLANEOUS) IMPLANT
DECANTER SPIKE VIAL GLASS SM (MISCELLANEOUS) ×2 IMPLANT
DRAPE PROXIMA HALF (DRAPES) IMPLANT
ELECT COATED BLADE 2.86 ST (ELECTRODE) ×2 IMPLANT
ELECT REM PT RETURN 9FT ADLT (ELECTROSURGICAL)
ELECT REM PT RETURN 9FT PED (ELECTROSURGICAL)
ELECTRODE REM PT RETRN 9FT PED (ELECTROSURGICAL) IMPLANT
ELECTRODE REM PT RTRN 9FT ADLT (ELECTROSURGICAL) IMPLANT
FORCEPS TISS BAYO ENTCEPS (INSTRUMENTS) ×2 IMPLANT
GAUZE SPONGE 4X4 16PLY XRAY LF (GAUZE/BANDAGES/DRESSINGS) ×2 IMPLANT
GLOVE ECLIPSE 8.0 STRL XLNG CF (GLOVE) ×2 IMPLANT
GOWN STRL REUS W/ TWL LRG LVL3 (GOWN DISPOSABLE) ×1 IMPLANT
GOWN STRL REUS W/ TWL XL LVL3 (GOWN DISPOSABLE) ×1 IMPLANT
GOWN STRL REUS W/TWL LRG LVL3 (GOWN DISPOSABLE) ×1
GOWN STRL REUS W/TWL XL LVL3 (GOWN DISPOSABLE) ×1
KIT BASIN OR (CUSTOM PROCEDURE TRAY) ×2 IMPLANT
KIT ROOM TURNOVER OR (KITS) ×2 IMPLANT
NEEDLE HYPO 25GX1X1/2 BEV (NEEDLE) IMPLANT
NEEDLE SPNL 22GX3.5 QUINCKE BK (NEEDLE) ×2 IMPLANT
NS IRRIG 1000ML POUR BTL (IV SOLUTION) ×2 IMPLANT
PACK SURGICAL SETUP 50X90 (CUSTOM PROCEDURE TRAY) ×2 IMPLANT
PAD ARMBOARD 7.5X6 YLW CONV (MISCELLANEOUS) ×4 IMPLANT
PENCIL FOOT CONTROL (ELECTRODE) ×2 IMPLANT
SOLUTION BUTLER CLEAR DIP (MISCELLANEOUS) ×2 IMPLANT
SPECIMEN JAR SMALL (MISCELLANEOUS) ×4 IMPLANT
SPONGE TONSIL 1 RF SGL (DISPOSABLE) ×2 IMPLANT
SWAB CULTURE LIQUID MINI MALE (MISCELLANEOUS) ×2 IMPLANT
SYR BULB 3OZ (MISCELLANEOUS) ×2 IMPLANT
SYR CONTROL 10ML LL (SYRINGE) ×2 IMPLANT
TOWEL OR 17X24 6PK STRL BLUE (TOWEL DISPOSABLE) ×4 IMPLANT
TUBE ANAEROBIC SPECIMEN COL (MISCELLANEOUS) ×2 IMPLANT
TUBE CONNECTING 12X1/4 (SUCTIONS) ×2 IMPLANT
TUBE SALEM SUMP 14F W/ARV (TUBING) IMPLANT
TUBE SALEM SUMP 16 FR W/ARV (TUBING) IMPLANT
WATER STERILE IRR 1000ML POUR (IV SOLUTION) ×2 IMPLANT
YANKAUER SUCT BULB TIP NO VENT (SUCTIONS) ×2 IMPLANT

## 2016-06-09 NOTE — Discharge Instructions (Signed)
Drink plenty of fluids.  Advance diet as comfortable. No strenuous activity x 1 week OK to wear CPAP when comfortable Recheck my office 2 weeks please  [phone] for an appointment.   Call for bleeding, excessive pain.

## 2016-06-09 NOTE — Op Note (Signed)
06/09/2016 4:51 PM    [name], [name]  [id number]   Pre-Op Dx:  RIGHT  Peritonsillar Abscess  Post-op Dx: same  Proc:  I&D right PTA/ RIGHT tonsillectomy Surg:  Flo Shanks T MD  Anes:  GOT  EBL:  20 ml  Comp:  none  Findings:  Microabscesses within and behind tonsil.  10 mm retropharyngeal node.   Procedure: With the patient in a comfortable supine position, GOT was administered in standard fashion.    At an appropriate level,  the table was turned 90 degrees away from anesthesia  and placed in Trendelenberg position. Routine clean preparation and draping was performed. Taking care to protect lips, teeth and endotracheal tube, the McIvor mouth gag was introduced, expanded for visualization, and suspended from the Mayo stand in the standard fashion.  The findings were as described as above.  Electrocautery was used to perform a crescent incision above the tonsil.  This was carried down on the capsule of the tonsil.  A small loculations of pus were encountered, and widely opened.  Given concern about parapharyngeal swelling, and Radiologic concern about possible cancer, a formal RIGHT tonsillectomy was performed with the cautery.   Hemostasis was spontaneous. The field was palpated with no abscess cavity nor induration identified.   The cavity was irrigated with sterile saline.  The mouth gag was relaxed for several minutes.  Upon re-expansion, hemostasis was observed.An NG tube was placed and minimal clear gastric contents were evacuated.    At this point the procedure was completed.  The mouth gag was relaxed and removed.  The dental status was  Intact.  The patient was returned to Anesthesia, awakened, extubated, and transferred to PACU in stable condition.    Dispo:   PACU to 3E  Plan:  Hydration, antibiosis, analgesia.    Cephus Richer MD

## 2016-06-09 NOTE — Transfer of Care (Signed)
Immediate Anesthesia Transfer of Care Note  Patient: Vanessa [name]  Procedure(s) Performed: Procedure(s): RIGHT TONSILECTOMY (Right) INCISION AND DRAINAGE OF PERITONSILLAR ABCESS (Right)  Patient Location: PACU  Anesthesia Type:General  Level of Consciousness: awake and alert   Airway & Oxygen Therapy: Patient Spontanous Breathing and Patient connected to face mask oxygen  Post-op Assessment: Report given to RN, Post -op Vital signs reviewed and stable and Patient moving all extremities X 4  Post vital signs: Reviewed and stable  Last Vitals:  Vitals:   06/09/16 1324 06/09/16 1705  BP: 116/87 134/74  Pulse: 74 (!) 114  Resp: 20 18  Temp: 37 C 36.6 C    Last Pain:  Vitals:   06/09/16 1705  TempSrc:   PainSc: 0-No pain         Complications: No apparent anesthesia complications

## 2016-06-09 NOTE — Progress Notes (Signed)
Pt is alert and oriented talking at baseline, in pain from the ED Gave PRn Morphine.

## 2016-06-09 NOTE — Anesthesia Preprocedure Evaluation (Signed)
Anesthesia Evaluation  Patient identified by MRN, date of birth, ID band Patient awake    Reviewed: Allergy & Precautions, NPO status , Patient's Chart, lab work & pertinent test results  Airway Mallampati: I  TM Distance: >3 FB Neck ROM: Full    Dental   Pulmonary Current Smoker,    Pulmonary exam normal        Cardiovascular hypertension, Pt. on medications Normal cardiovascular exam     Neuro/Psych Anxiety Depression    GI/Hepatic GERD  Medicated and Controlled,  Endo/Other  diabetes, Type 2, Insulin Dependent  Renal/GU      Musculoskeletal   Abdominal   Peds  Hematology   Anesthesia Other Findings   Reproductive/Obstetrics                             Anesthesia Physical Anesthesia Plan  ASA: III  Anesthesia Plan: General   Post-op Pain Management:    Induction: Intravenous  Airway Management Planned: Oral ETT  Additional Equipment:   Intra-op Plan:   Post-operative Plan: Extubation in OR  Informed Consent: I have reviewed the patients History and Physical, chart, labs and discussed the procedure including the risks, benefits and alternatives for the proposed anesthesia with the patient or authorized representative who has indicated his/her understanding and acceptance.     Plan Discussed with: CRNA and Surgeon  Anesthesia Plan Comments:         Anesthesia Quick Evaluation

## 2016-06-09 NOTE — Progress Notes (Signed)
Pt is alert and oriented ENT at bedside with Procedure cart at Bedside .

## 2016-06-09 NOTE — Progress Notes (Signed)
Pt is alert and oriented with a swelling in throat and cough, Plan to today to have a Biopsy of the mass found. Will obtain a procedure cart from OR.

## 2016-06-09 NOTE — Progress Notes (Signed)
61yo F with history of COPD, tobacco and alcohol use, OSA on CPAP, Graves' disease, HTN and T2DM presented for sore throat and dysphagia for the 3rd time in the past week found to have tonsillitis superimposed on pharyngeal mass. ENT was consulted, will see this AM, and the patient was brought in for observation. Please review H&P for full details.   Vital signs within normal limits without hypoxia, respiratory distress, or stridor.   - ENT to evaluate pt. May need Bx, certainly has risk factors for malignancy.  - Continue clindamycin, decadron - Cycling CE's - RVP pending - Otherwise, per full plan in H&P.   Hazeline Junker, MD 06/09/2016 7:32 AM

## 2016-06-09 NOTE — ED Notes (Signed)
Pt ambulated to restroom. 

## 2016-06-09 NOTE — H&P (Signed)
Vanessa [name] ZOX:[id number] DOB: 07-08-1955 DOA: 06/08/2016     PCP: Gilda Crease, MD   Outpatient Specialists: [facility]    Patient coming from:   home Lives alone,    Chief Complaint: Difficulty swallowing  HPI: Vanessa [name] is a 61 y.o. female with medical history significant of HTN, DM 2 alcohol abuse, tobacco abuse, chronic back pain, OSA asthma/COPD, Graves' disease,  depression    Presented with three-day history of throat swelling and pain with difficulty swallowing she is being evaluated in the emergency room for this twice in the past 3 days. She was seen and started on antibiotics clindamycin and steroids yesterday but continued to have significant swelling and pain and was scared that she is going to choke so she presented to emergency department again.  During prior evaluation exam was unremarkable and Rapid strep was negative she was discharged home. She is unsure if had any fevers she had been having intermittent abdominal pain and chest pains or past one month. Regarding pertinent Chronic problems: History of COPD continues to smoke Hx of sleep apnea on CPAP  IN ER:  Temp (24hrs), Avg:99.1 F (37.3 C), Min:99.1 F (37.3 C), Max:99.1 F (37.3 C)     RR 20 satting 95% HR 86 BP 117/82 Creatinine 1.16 which is baseline WBC 17.8 which is up from February hemoglobin 11.2 CT showing 2 x 1 x 3 cm ill-defined cystic and solid mass of the right pontine tonsil extending to the base of the tongue acute tonsillitis also noted Following Medications were ordered in ER: Medications  lidocaine (XYLOCAINE) 2 % viscous mouth solution 15 mL (not administered)  HYDROmorphone (DILAUDID) injection 0.5 mg (not administered)  dexamethasone (DECADRON) injection 10 mg (not administered)  clindamycin (CLEOCIN) IVPB 600 mg (not administered)  HYDROcodone-homatropine (HYCODAN) 5-1.5 MG/5ML syrup 5 mL (5 mLs Oral Given 06/08/16 2224)  sodium chloride 0.9 % bolus 1,000 mL  (1,000 mLs Intravenous New Bag/Given 06/08/16 2222)  iopamidol (ISOVUE-300) 61 % injection (75 mLs  Contrast Given 06/08/16 2326)     ER provider discussed case with:  ENT: who will see her in the morning but recommended admission to medicine for observation for tonight  Hospitalist was called for admission for a diagnosis of pharyngeal mass as well as tonsillitis resulting in swallowing compromise  Review of Systems:    Pertinent positives include: fatigue,  Constitutional:  No weight loss, night sweats, Fevers, chills,  weight loss Sore throat,  HEENT:  No headaches, Difficulty swallowing,Tooth/dental problems, No sneezing, itching, ear ache, nasal congestion, post nasal drip,  Cardio-vascular:  No chest pain, Orthopnea, PND, anasarca, dizziness, palpitations.no Bilateral lower extremity swelling  GI:  No heartburn, indigestion, abdominal pain, nausea, vomiting, diarrhea, change in bowel habits, loss of appetite, melena, blood in stool, hematemesis Resp:  no shortness of breath at rest. No dyspnea on exertion, No excess mucus, no productive cough, No non-productive cough, No coughing up of blood.No change in color of mucus.No wheezing. Skin:  no rash or lesions. No jaundice GU:  no dysuria, change in color of urine, no urgency or frequency. No straining to urinate.  No flank pain.  Musculoskeletal:  No joint pain or no joint swelling. No decreased range of motion. No back pain.  Psych:  No change in mood or affect. No depression or anxiety. No memory loss.  Neuro: no localizing neurological complaints, no tingling, no weakness, no double vision, no gait abnormality, no slurred speech, no confusion  As per HPI otherwise  10 point review of systems negative.   Past Medical History: Past Medical History:  Diagnosis Date  . Alcoholism (HCC)   . Allergic rhinitis   . Allergy   . Anxiety   . Arthritis    rheumatoid  . Asthma   . Back pain, chronic   . Chronic back pain  05/27/2012   Secondary to L4-5 herniated disc per patient  . Chronic headache   . COPD (chronic obstructive pulmonary disease) (HCC)   . Depression   . Diabetes mellitus   . Emphysema   . Emphysema of lung (HCC)   . Generalized anxiety disorder 05/26/2012  . GERD (gastroesophageal reflux disease)   . Grave's disease   . 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 Heart murmur   . Hernia, umbilical   . Herniated disc   . Herniated disc   . Hyperlipidemia   . Hypertension   . Menopause   . Neuropathy (HCC)   . OSA (obstructive sleep apnea)    has a cpap  . Osteoporosis   . Rheumatoid arthritis (HCC)   . Shingles   . Sleep apnea    wear c-pap  . Tear of lateral meniscus of right knee 10/13/2013  . Wears dentures    top  . Wears glasses    Past Surgical History:  Procedure Laterality Date  . CHOLECYSTECTOMY    . EYE SURGERY     eye back in socker-rt  . FOOT SURGERY     cyst rt foot  . KNEE ARTHROSCOPY WITH LATERAL MENISECTOMY Right 10/13/2013   Procedure: RIGHT KNEE ARTHROSCOPY WITH PARTIAL LATERAL MENISECTOMY/DEBRIDEMENT/SHAVING ;  Surgeon: Eulas Post, MD;  Location: [facility];  Service: Orthopedics;  Laterality: Right;  . LAPAROSCOPY     age 7  . LEFT HEART CATHETERIZATION WITH CORONARY ANGIOGRAM N/A 05/30/2012   Procedure: LEFT HEART CATHETERIZATION WITH CORONARY ANGIOGRAM;  Surgeon: [name], MD;  Location: [facility] CATH LAB;  Service: Cardiovascular;  Laterality: N/A;  . TUBAL LIGATION       Social History:  Ambulatory   Independently     reports that she has been smoking Cigarettes.  She has a 40.00 pack-year smoking history. She has never used smokeless tobacco. She reports that she does not drink alcohol or use drugs.  Allergies:   Allergies  Allergen Reactions  . Suboxone [Buprenorphine Hcl-Naloxone Hcl] Itching    Vaginal itching   . Ciprofloxacin Hives and Itching     Hives  . Penicillins Hives and Itching    Has patient had a PCN reaction causing immediate rash, facial/tongue/throat swelling, SOB or lightheadedness with hypotension: Yes Has patient had a PCN reaction causing severe rash involving mucus membranes or skin necrosis: No Has patient had a PCN reaction that required hospitalization: No Has patient had a PCN reaction occurring within the last 10 years: No If all of the above answers are "NO", then may proceed with Cephalosporin use.        Family History:   Family History  Problem Relation Age of Onset  . Emphysema Mother   . Allergies Mother   . Asthma Mother   . Heart disease Mother   . Rheum arthritis Mother   . Diabetes Mother   . Kidney disease Mother   . Allergies Daughter   . Asthma Brother   . Breast cancer Other     aunt  . Lung cancer Brother   .  Throat cancer Brother   . Colon cancer Neg Hx   . Colon polyps Neg Hx   . Esophageal cancer Neg Hx   . Rectal cancer Neg Hx   . Stomach cancer Neg Hx     Medications: Prior to Admission medications   Medication Sig Start Date End Date Taking? Authorizing Provider  albuterol (PROAIR HFA) 108 (90 BASE) MCG/ACT inhaler Inhale 2 puffs into the lungs 4 (four) times daily. For COPD   Yes Historical Provider, MD  atorvastatin (LIPITOR) 40 MG tablet Take 1 tablet (40 mg total) by mouth daily. 10/06/12  Yes Gust Rung, DO  busPIRone (BUSPAR) 10 MG tablet Take 20 mg by mouth 2 (two) times daily.    Yes Historical Provider, MD  clindamycin (CLEOCIN) 150 MG capsule Take 2 capsules (300 mg total) by mouth 3 (three) times daily. 06/07/16  Yes Jaime Pilcher Ward, PA-C  cycloSPORINE (RESTASIS) 0.05 % ophthalmic emulsion Place 1 drop into both eyes 2 (two) times daily.   Yes Historical Provider, MD  FLUoxetine (PROZAC) 20 MG capsule Take 60 mg by mouth every morning.    Yes Historical Provider, MD  glipiZIDE (GLUCOTROL) 10 MG tablet Take 10 mg by mouth daily before breakfast.    Yes  Historical Provider, MD  HYDROcodone-acetaminophen (NORCO) 10-325 MG tablet Take 0.5-1 tablets by mouth every 6 (six) hours as needed for severe pain.    Yes Historical Provider, MD  ibuprofen (ADVIL,MOTRIN) 200 MG tablet Take 400 mg by mouth every 6 (six) hours as needed (for pain).   Yes Historical Provider, MD  ipratropium-albuterol (DUONEB) 0.5-2.5 (3) MG/3ML SOLN Take 3 mLs by nebulization every 6 (six) hours as needed (for wheezing or shortness of breath).    Yes Historical Provider, MD  levothyroxine (SYNTHROID, LEVOTHROID) 175 MCG tablet Take 1 tablet (175 mcg total) by mouth every morning. 03/10/16  Yes Audry Pili, PA-C  lidocaine (XYLOCAINE) 2 % solution Use as directed 15 mLs in the mouth or throat as needed for mouth pain. 06/07/16  Yes Jaime Pilcher Ward, PA-C  lisinopril (PRINIVIL,ZESTRIL) 10 MG tablet Take 1 tablet (10 mg total) by mouth daily. 03/10/16  Yes Audry Pili, PA-C  loratadine (CLARITIN) 10 MG tablet Take 10 mg by mouth daily.    Yes Historical Provider, MD  meloxicam (MOBIC) 15 MG tablet Take 15 mg by mouth daily.   Yes Historical Provider, MD  metFORMIN (GLUCOPHAGE) 1000 MG tablet Take 1 tablet (1,000 mg total) by mouth 2 (two) times daily. Patient taking differently: Take 1,000 mg by mouth daily with breakfast.  03/10/16  Yes Audry Pili, PA-C  mirtazapine (REMERON) 45 MG tablet Take 45 mg by mouth at bedtime as needed (for depression).    Yes Historical Provider, MD  Pseudoeph-Doxylamine-DM-APAP 60-12.08-26-998 MG/30ML LIQD Take 30 mLs by mouth at bedtime.   Yes Historical Provider, MD  QUEtiapine (SEROQUEL) 50 MG tablet Take 50 mg by mouth at bedtime.   Yes Historical Provider, MD  tiotropium (SPIRIVA) 18 MCG inhalation capsule Place 1 capsule (18 mcg total) into inhaler and inhale daily. 12/18/14  Yes Valentino Nose, MD  verapamil (CALAN) 40 MG tablet Take 40 mg by mouth 3 (three) times daily.   Yes Historical Provider, MD  promethazine-dextromethorphan (PROMETHAZINE-DM)  6.25-15 MG/5ML syrup Take 5 mLs by mouth 4 (four) times daily as needed for cough. Patient not taking: Reported on 06/08/2016 04/14/16   Shaune Pollack, MD  sennosides-docusate sodium (SENOKOT-S) 8.6-50 MG tablet Take 2 tablets by mouth daily. Patient taking  differently: Take 2 tablets by mouth daily as needed for constipation.  10/13/13   Teryl Lucy, MD    Physical Exam: Patient Vitals for the past 24 hrs:  BP Temp Temp src Pulse Resp SpO2 Height Weight  06/08/16 2300 117/82 - - 86 - 95 % - -  06/08/16 2230 109/69 - - 91 - 97 % - -  06/08/16 2200 112/73 - - 93 - 98 % - -  06/08/16 2130 123/65 - - 98 - 100 % - -  06/08/16 2109 101/63 99.1 F (37.3 C) Oral 107 20 100 % 5\' 9"  (1.753 m) 100.4 kg (221 lb 5 oz)    1. General:  in No Acute distress, horse voice 2. Psychological: Alert and   Oriented 3. Head/ENT:     Dry Mucous Membranes                          Head Non traumatic, neck supple                           Poor Dentition                          Oropharynx slightly erythematous                          Lymphadenopathy present right worsening left 4. SKIN: normal  Skin turgor,  Skin clean Dry and intact no rash 5. Heart: Regular rate and rhythm no  Murmur, Rub or gallop 6. Lungs:  Clear to auscultation bilaterally, no wheezes or crackles   7. Abdomen: Soft,  non-tender, Non distended obese 8. Lower extremities: no clubbing, cyanosis, or edema 9. Neurologically Grossly intact, moving all 4 extremities equally   10. MSK: Normal range of motion   body mass index is 32.68 kg/m.  Labs on Admission:   Labs on Admission: I have personally reviewed following labs and imaging studies  CBC:  Recent Labs Lab 06/08/16 2157  WBC 17.8*  NEUTROABS 13.9*  HGB 11.2*  HCT 34.2*  MCV 88.4  PLT 198   Basic Metabolic Panel:  Recent Labs Lab 06/08/16 2157  NA 138  K 4.0  CL 100*  CO2 28  GLUCOSE 111*  BUN 10  CREATININE 1.16*  CALCIUM 9.1   GFR: Estimated Creatinine  Clearance: 66.7 mL/min (by C-G formula based on SCr of 1.16 mg/dL (H)). Liver Function Tests: No results for input(s): AST, ALT, ALKPHOS, BILITOT, PROT, ALBUMIN in the last 168 hours. No results for input(s): LIPASE, AMYLASE in the last 168 hours. No results for input(s): AMMONIA in the last 168 hours. Coagulation Profile: No results for input(s): INR, PROTIME in the last 168 hours. Cardiac Enzymes: No results for input(s): CKTOTAL, CKMB, CKMBINDEX, TROPONINI in the last 168 hours. BNP (last 3 results) No results for input(s): PROBNP in the last 8760 hours. HbA1C: No results for input(s): HGBA1C in the last 72 hours. CBG: No results for input(s): GLUCAP in the last 168 hours. Lipid Profile: No results for input(s): CHOL, HDL, LDLCALC, TRIG, CHOLHDL, LDLDIRECT in the last 72 hours. Thyroid Function Tests: No results for input(s): TSH, T4TOTAL, FREET4, T3FREE, THYROIDAB in the last 72 hours. Anemia Panel: No results for input(s): VITAMINB12, FOLATE, FERRITIN, TIBC, IRON, RETICCTPCT in the last 72 hours.  Sepsis Labs: @LABRCNTIP (procalcitonin:4,lacticidven:4) ) Recent Results (from the past 240 hour(s))  Rapid strep screen     Status: None   Collection Time: 06/06/16  9:35 PM  Result Value Ref Range Status   Streptococcus, Group A Screen (Direct) NEGATIVE NEGATIVE Final    Comment: (NOTE) A Rapid Antigen test may result negative if the antigen level in the sample is below the detection level of this test. The FDA has not cleared this test as a stand-alone test therefore the rapid antigen negative result has reflexed to a Group A Strep culture.   Culture, group A strep     Status: None (Preliminary result)   Collection Time: 06/06/16  9:35 PM  Result Value Ref Range Status   Specimen Description THROAT  Final   Special Requests NONE Reflexed from [id number]  Final   Culture CULTURE REINCUBATED FOR BETTER GROWTH  Final   Report Status PENDING  Incomplete      UA  not ordered  Lab  Results  Component Value Date   HGBA1C 6.9 (H) 12/18/2014    Estimated Creatinine Clearance: 66.7 mL/min (by C-G formula based on SCr of 1.16 mg/dL (H)).  BNP (last 3 results) No results for input(s): PROBNP in the last 8760 hours.   ECG REPORT Not obtained  [facility] Weights   06/08/16 2109  Weight: 100.4 kg (221 lb 5 oz)     Cultures:    Component Value Date/Time   SDES THROAT 06/06/2016 2135   SPECREQUEST NONE Reflexed from [id number] 06/06/2016 2135   CULT CULTURE REINCUBATED FOR BETTER GROWTH 06/06/2016 2135   REPTSTATUS PENDING 06/06/2016 2135     Radiological Exams on Admission: Ct Soft Tissue Neck W Contrast  Result Date: 06/09/2016 CLINICAL DATA:  Sore throat. History of diabetes, COPD, grave's disease. EXAM: CT NECK WITH CONTRAST TECHNIQUE: Multidetector CT imaging of the neck was performed using the standard protocol following the bolus administration of intravenous contrast. CONTRAST:  37mL ISOVUE-300 IOPAMIDOL (ISOVUE-300) INJECTION 61% COMPARISON:  Cervical spine radiographs April 14, 2015 FINDINGS: PHARYNX AND LARYNX: 2 x 1.2 x 3.3 cm (transverse by AP by CC) ill-defined cystic and solid mass RIGHT palatine tonsil extending to the base of tongue. No superimposed findings of acute tonsillitis. Mild RIGHT parapharyngeal fat stranding. Edema and mass effect extending to RIGHT hypopharynx with medial deviation RIGHT cricoarytenoid joint. However, symmetric piriform sinuses and normal larynx. SALIVARY GLANDS: Normal. THYROID: Trace residual thyroid tissue. LYMPH NODES: No lymphadenopathy by CT size criteria. However, 7 mm retropharyngeal lymph node. 8 mm short access RIGHT level IIa lymph node. VASCULAR: Normal. LIMITED INTRACRANIAL: Normal. VISUALIZED ORBITS: Nonacute. Old LEFT orbital floor blowout fracture. MASTOIDS AND VISUALIZED PARANASAL SINUSES: Well-aerated. SKELETON: Nonacute. Patient is edentulous. Mild degenerative change of the cervical spine. UPPER CHEST: Lung apices  are clear. No superior mediastinal lymphadenopathy. OTHER: None. IMPRESSION: Cystic and solid 2 x 1.2 x 3.3 cm RIGHT palatine tonsil to base of tongue mass, this may be simple abscess though superinfected neoplasm or, necrotic neoplasm is suspected (including 7 mm RIGHT retropharyngeal lymph node). Patent airway. Recommend follow-up ENT consultation after treatment. Small amount of residual thyroid tissue. Acute findings discussed with and reconfirmed by PA KELLY GEKAS on 06/08/2016 at 11:56 pm. Electronically Signed   By: Awilda Metro M.D.   On: 06/09/2016 00:00    Chart has been reviewed    Assessment/Plan   61 y.o. female with medical history significant of HTN, DM 2 alcohol abuse, tobacco abuse, chronic back pain, OSA asthma/COPD, Graves' disease,  depression was found to have new diagnoses of pharyngeal mass  with swallowing compromise likely worsened by tonsillitis  Present on Admission:   . Pharyngeal mass ENT has been consulted may need biopsy Pharyngitis/tonsillitis - obtain viral panel rapid strep negative given possibility of abscess will continue with clindamycin and Decadron monitor for airway obstruction. Patient this point was tolerate he denies chips speaking in hoarse voice no drooling noted Chest pain most likely musculoskeletal but given risk factors will admit to telemetry cycle cardiac enzymes Diabetes mellitus hold by mouth medications order sliding scale . Alcohol abuse, in remission currently denies continue to monitor . Graves' disease with exophthalmos patient on Synthroid while unable to tolerate by mouth changed to IV . OSA (obstructive sleep apnea) C Pap daily at bedtime  . Essential hypertension stable continue home medication when able to tolerate Other plan as per orders.  DVT prophylaxis:  SCD      Code Status:  FULL CODE  s per patient    Family Communication:   Family not  at  Bedside    Disposition Plan:     To home once workup is complete and  patient is stable                                             Consults called: ENT   Admission status:  obs   Level of care     tele            I have spent a total of 56 min on this admission  Maxx Pham 06/09/2016, 2:07 AM    [facility]  Pager [phone]   after 2 AM please page floor coverage PA If 7AM-7PM, please contact the day team taking care of the patient  [email or web address]  Password [email or web address]

## 2016-06-09 NOTE — Progress Notes (Signed)
Pt states she takes Neurontin for nerve pain until her paperwork gets cleared for Lyrica. These meds do not appear on PTA MAR. Pt states she had been on Lyrica for 5 years, but just got a prescription for Gabapentin last Tuesday until Lyrica can be resumed.

## 2016-06-09 NOTE — Consult Note (Signed)
Vanessa [name], Vanessa [name] 61 y.o., female [id number]     Chief Complaint: progressive RIGHT sore throat  HPI: 61 yo bf, onset sore throat with difficulty swallowing 4 days ago.  Ist ED visit strep -, felt to be viral.  2nd visit given IM steroids and Clindamycin.  T2DM, sugar went to 500.  Yest evening handling secretions poorly, seen in ED and admitted.  CT showed heterogeneous RIGHT tonsil, either abscess or possible necrotic tumor.  No breathing difficulty, no voice change.  No fever.  No prior tonsil issues.  No hx cancer.  Smokes 1/2 ppd.  Small amt wine consumption.  Past hx drug use, marijuana, alcoholism, heavier cigarette use.  No neck masses.  No weight loss.    Saw Dr. Simeon Craft, ENT, 1-2 yrs ago who diagnosed inflammation.  She has known GERD.  PMH: Past Medical History:  Diagnosis Date  . Alcoholism ([place])   . Allergic rhinitis   . Allergy   . Anxiety   . Arthritis    rheumatoid  . Asthma   . Back pain, chronic   . Chronic back pain 05/27/2012   Secondary to L4-5 herniated disc per patient  . Chronic headache   . COPD (chronic obstructive pulmonary disease) ([place])   . Depression   . Diabetes mellitus   . Emphysema   . Emphysema of lung ([place])   . Generalized anxiety disorder 05/26/2012  . GERD (gastroesophageal reflux disease)   . Grave's disease   . 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 Heart murmur   . Hernia, umbilical   . Herniated disc   . Herniated disc   . Hyperlipidemia   . Hypertension   . Menopause   . Neuropathy ([place])   . OSA (obstructive sleep apnea)    has a cpap  . Osteoporosis   . 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 Rheumatoid arthritis ([place])   . Shingles   . Sleep apnea    wear c-pap  . Tear of lateral meniscus of right knee 10/13/2013  . Wears dentures    top  . Wears glasses     Surg Hx: Past Surgical History:  Procedure Laterality Date  . CHOLECYSTECTOMY    . EYE SURGERY     eye back in  [place]-rt  . FOOT SURGERY     cyst rt foot  . KNEE ARTHROSCOPY WITH LATERAL MENISECTOMY Right 10/13/2013   Procedure: RIGHT KNEE ARTHROSCOPY WITH PARTIAL LATERAL MENISECTOMY/DEBRIDEMENT/SHAVING ;  Surgeon: Johnny Bridge, MD;  Location: [facility];  Service: Orthopedics;  Laterality: Right;  . LAPAROSCOPY     age 35  . LEFT HEART CATHETERIZATION WITH CORONARY ANGIOGRAM N/A 05/30/2012   Procedure: LEFT HEART CATHETERIZATION WITH CORONARY ANGIOGRAM;  Surgeon: Laverda Page, MD;  Location: [facility] CATH LAB;  Service: Cardiovascular;  Laterality: N/A;  . TUBAL LIGATION      FHx:   Family History  Problem Relation Age of Onset  . Emphysema Mother   . Allergies Mother   . Asthma Mother   . Heart disease Mother   . Rheum arthritis Mother   . Diabetes Mother   . Kidney disease Mother   . Allergies Daughter   . Asthma Brother   . Breast cancer Other     aunt  . Lung cancer Brother   . Throat cancer Brother   . Colon cancer Neg Hx   . Colon polyps Neg Hx   . Esophageal  cancer Neg Hx   . Rectal cancer Neg Hx   . Stomach cancer Neg Hx    SocHx:  reports that she has been smoking Cigarettes.  She has a 40.00 pack-year smoking history. She has never used smokeless tobacco. She reports that she uses drugs, including Marijuana. She reports that she does not drink alcohol.  ALLERGIES:  Allergies  Allergen Reactions  . Suboxone [Buprenorphine Hcl-Naloxone Hcl] Itching    Vaginal itching   . Ciprofloxacin Hives and Itching    Hives  . Penicillins Hives and Itching    Has patient had a PCN reaction causing immediate rash, facial/tongue/throat swelling, SOB or lightheadedness with hypotension: Yes Has patient had a PCN reaction causing severe rash involving mucus membranes or skin necrosis: No Has patient had a PCN reaction that required hospitalization: No Has patient had a PCN reaction occurring within the last 10 years: No If all of the above answers are "NO", then may  proceed with Cephalosporin use.     Medications Prior to Admission  Medication Sig Dispense Refill  . albuterol (PROAIR HFA) 108 (90 BASE) MCG/ACT inhaler Inhale 2 puffs into the lungs 4 (four) times daily. For COPD    . atorvastatin (LIPITOR) 40 MG tablet Take 1 tablet (40 mg total) by mouth daily. 30 tablet 0  . busPIRone (BUSPAR) 10 MG tablet Take 20 mg by mouth 2 (two) times daily.     . clindamycin (CLEOCIN) 150 MG capsule Take 2 capsules (300 mg total) by mouth 3 (three) times daily. 60 capsule 0  . cycloSPORINE (RESTASIS) 0.05 % ophthalmic emulsion Place 1 drop into both eyes 2 (two) times daily.    Marland Kitchen FLUoxetine (PROZAC) 20 MG capsule Take 60 mg by mouth every morning.     Marland Kitchen glipiZIDE (GLUCOTROL) 10 MG tablet Take 10 mg by mouth daily before breakfast.     . HYDROcodone-acetaminophen (NORCO) 10-325 MG tablet Take 0.5-1 tablets by mouth every 6 (six) hours as needed for severe pain.     Marland Kitchen ibuprofen (ADVIL,MOTRIN) 200 MG tablet Take 400 mg by mouth every 6 (six) hours as needed (for pain).    Marland Kitchen ipratropium-albuterol (DUONEB) 0.5-2.5 (3) MG/3ML SOLN Take 3 mLs by nebulization every 6 (six) hours as needed (for wheezing or shortness of breath).     Marland Kitchen levothyroxine (SYNTHROID, LEVOTHROID) 175 MCG tablet Take 1 tablet (175 mcg total) by mouth every morning. 30 tablet 0  . lidocaine (XYLOCAINE) 2 % solution Use as directed 15 mLs in the mouth or throat as needed for mouth pain. 100 mL 0  . lisinopril (PRINIVIL,ZESTRIL) 10 MG tablet Take 1 tablet (10 mg total) by mouth daily. 30 tablet 0  . loratadine (CLARITIN) 10 MG tablet Take 10 mg by mouth daily.     . meloxicam (MOBIC) 15 MG tablet Take 15 mg by mouth daily.    . metFORMIN (GLUCOPHAGE) 1000 MG tablet Take 1 tablet (1,000 mg total) by mouth 2 (two) times daily. (Patient taking differently: Take 1,000 mg by mouth daily with breakfast. ) 30 tablet 0  . mirtazapine (REMERON) 45 MG tablet Take 45 mg by mouth at bedtime as needed (for  depression).     . Pseudoeph-Doxylamine-DM-APAP 60-12.08-26-998 MG/30ML LIQD Take 30 mLs by mouth at bedtime.    Marland Kitchen QUEtiapine (SEROQUEL) 50 MG tablet Take 50 mg by mouth at bedtime.    Marland Kitchen tiotropium (SPIRIVA) 18 MCG inhalation capsule Place 1 capsule (18 mcg total) into inhaler and inhale daily. 30 capsule 0  .  verapamil (CALAN) 40 MG tablet Take 40 mg by mouth 3 (three) times daily.    . promethazine-dextromethorphan (PROMETHAZINE-DM) 6.25-15 MG/5ML syrup Take 5 mLs by mouth 4 (four) times daily as needed for cough. (Patient not taking: Reported on 06/08/2016) 100 mL 0  . sennosides-docusate sodium (SENOKOT-S) 8.6-50 MG tablet Take 2 tablets by mouth daily. (Patient taking differently: Take 2 tablets by mouth daily as needed for constipation. ) 30 tablet 1    Results for orders placed or performed during the hospital encounter of 06/08/16 (from the past 48 hour(s))  Basic metabolic panel     Status: Abnormal   Collection Time: 06/08/16  9:57 PM  Result Value Ref Range   Sodium 138 135 - 145 mmol/L   Potassium 4.0 3.5 - 5.1 mmol/L   Chloride 100 (L) 101 - 111 mmol/L   CO2 28 22 - 32 mmol/L   Glucose, Bld 111 (H) 65 - 99 mg/dL   BUN 10 6 - 20 mg/dL   Creatinine, Ser 1.16 (H) 0.44 - 1.00 mg/dL   Calcium 9.1 8.9 - 10.3 mg/dL   GFR calc non Af Amer 51 (L) >60 mL/min   GFR calc Af Amer 59 (L) >60 mL/min    Comment: (NOTE) The eGFR has been calculated using the CKD EPI equation. This calculation has not been validated in all clinical situations. eGFR's persistently <60 mL/min signify possible Chronic Kidney Disease.    Anion gap 10 5 - 15  CBC with Differential     Status: Abnormal   Collection Time: 06/08/16  9:57 PM  Result Value Ref Range   WBC 17.8 (H) 4.0 - 10.5 K/uL   RBC 3.87 3.87 - 5.11 MIL/uL   Hemoglobin 11.2 (L) 12.0 - 15.0 g/dL   HCT 34.2 (L) 36.0 - 46.0 %   MCV 88.4 78.0 - 100.0 fL   MCH 28.9 26.0 - 34.0 pg   MCHC 32.7 30.0 - 36.0 g/dL   RDW 13.6 11.5 - 15.5 %    Platelets 198 150 - 400 K/uL   Neutrophils Relative % 78 %   Neutro Abs 13.9 (H) 1.7 - 7.7 K/uL   Lymphocytes Relative 16 %   Lymphs Abs 2.8 0.7 - 4.0 K/uL   Monocytes Relative 6 %   Monocytes Absolute 1.0 0.1 - 1.0 K/uL   Eosinophils Relative 0 %   Eosinophils Absolute 0.1 0.0 - 0.7 K/uL   Basophils Relative 0 %   Basophils Absolute 0.0 0.0 - 0.1 K/uL  Troponin I (q 6hr x 3)     Status: None   Collection Time: 06/09/16  1:28 AM  Result Value Ref Range   Troponin I <0.03 <0.03 ng/mL  Troponin I (q 6hr x 3)     Status: None   Collection Time: 06/09/16  7:30 AM  Result Value Ref Range   Troponin I <0.03 <0.03 ng/mL  Magnesium     Status: Abnormal   Collection Time: 06/09/16  7:30 AM  Result Value Ref Range   Magnesium 1.6 (L) 1.7 - 2.4 mg/dL  Phosphorus     Status: None   Collection Time: 06/09/16  7:30 AM  Result Value Ref Range   Phosphorus 3.3 2.5 - 4.6 mg/dL  TSH     Status: None   Collection Time: 06/09/16  7:30 AM  Result Value Ref Range   TSH 2.008 0.350 - 4.500 uIU/mL    Comment: Performed by a 3rd Generation assay with a functional sensitivity of <=0.01 uIU/mL.  Comprehensive metabolic panel     Status: Abnormal   Collection Time: 06/09/16  7:30 AM  Result Value Ref Range   Sodium 135 135 - 145 mmol/L   Potassium 4.8 3.5 - 5.1 mmol/L   Chloride 100 (L) 101 - 111 mmol/L   CO2 25 22 - 32 mmol/L   Glucose, Bld 246 (H) 65 - 99 mg/dL   BUN 11 6 - 20 mg/dL   Creatinine, Ser 0.95 0.44 - 1.00 mg/dL   Calcium 8.8 (L) 8.9 - 10.3 mg/dL   Total Protein 6.3 (L) 6.5 - 8.1 g/dL   Albumin 3.7 3.5 - 5.0 g/dL   AST 17 15 - 41 U/L   ALT 19 14 - 54 U/L   Alkaline Phosphatase 45 38 - 126 U/L   Total Bilirubin 0.3 0.3 - 1.2 mg/dL   GFR calc non Af Amer >60 >60 mL/min   GFR calc Af Amer >60 >60 mL/min    Comment: (NOTE) The eGFR has been calculated using the CKD EPI equation. This calculation has not been validated in all clinical situations. eGFR's persistently <60 mL/min  signify possible Chronic Kidney Disease.    Anion gap 10 5 - 15  CBC     Status: Abnormal   Collection Time: 06/09/16  7:30 AM  Result Value Ref Range   WBC 18.6 (H) 4.0 - 10.5 K/uL   RBC 3.88 3.87 - 5.11 MIL/uL   Hemoglobin 11.2 (L) 12.0 - 15.0 g/dL   HCT 34.1 (L) 36.0 - 46.0 %   MCV 87.9 78.0 - 100.0 fL   MCH 28.9 26.0 - 34.0 pg   MCHC 32.8 30.0 - 36.0 g/dL   RDW 13.7 11.5 - 15.5 %   Platelets 210 150 - 400 K/uL  Glucose, capillary     Status: Abnormal   Collection Time: 06/09/16 12:09 PM  Result Value Ref Range   Glucose-Capillary 161 (H) 65 - 99 mg/dL   Ct Soft Tissue Neck W Contrast  Result Date: 06/09/2016 CLINICAL DATA:  Sore throat. History of diabetes, COPD, grave's disease. EXAM: CT NECK WITH CONTRAST TECHNIQUE: Multidetector CT imaging of the neck was performed using the standard protocol following the bolus administration of intravenous contrast. CONTRAST:  54m ISOVUE-300 IOPAMIDOL (ISOVUE-300) INJECTION 61% COMPARISON:  Cervical spine radiographs April 14, 2015 FINDINGS: PHARYNX AND LARYNX: 2 x 1.2 x 3.3 cm (transverse by AP by CC) ill-defined cystic and solid mass RIGHT palatine tonsil extending to the base of tongue. No superimposed findings of acute tonsillitis. Mild RIGHT parapharyngeal fat stranding. Edema and mass effect extending to RIGHT hypopharynx with medial deviation RIGHT cricoarytenoid joint. However, symmetric piriform sinuses and normal larynx. SALIVARY GLANDS: Normal. THYROID: Trace residual thyroid tissue. LYMPH NODES: No lymphadenopathy by CT size criteria. However, 7 mm retropharyngeal lymph node. 8 mm short access RIGHT level IIa lymph node. VASCULAR: Normal. LIMITED INTRACRANIAL: Normal. VISUALIZED ORBITS: Nonacute. Old LEFT orbital floor blowout fracture. MASTOIDS AND VISUALIZED PARANASAL SINUSES: Well-aerated. SKELETON: Nonacute. Patient is edentulous. Mild degenerative change of the cervical spine. UPPER CHEST: Lung apices are clear. No superior  mediastinal lymphadenopathy. OTHER: None. IMPRESSION: Cystic and solid 2 x 1.2 x 3.3 cm RIGHT palatine tonsil to base of tongue mass, this may be simple abscess though superinfected neoplasm or, necrotic neoplasm is suspected (including 7 mm RIGHT retropharyngeal lymph node). Patent airway. Recommend follow-up ENT consultation after treatment. Small amount of residual thyroid tissue. Acute findings discussed with and reconfirmed by PA KELLY GEKAS on 06/08/2016 at 11:56 pm.  Electronically Signed   By: Elon Alas M.D.   On: 06/09/2016 00:00    ROS:No SOB or chest pain.  No hemoptysis.  No aspiration.  Sl RIGHT referred otalgia.   Blood pressure 122/71, pulse 78, temperature 98.6 F (37 C), temperature source Oral, resp. rate 18, height _0  (1.753 m), weight 95.6 kg (210 lb 12.8 oz), SpO2 95 %.  PHYSICAL EXAM: Overall appearance:  Alert and appropriate.  Ox4.  Voice loud and clear.  No fetor oris.  No coughing or throat clearing.  Resps unlabored through the nose.  Overall obese. Head:  NCAT Ears:  clear Nose:  Dry bilat. Oral Cavity:  Moist.  No teeth or plates.   Oral Pharynx/Hypopharynx/Larynx:  1-2+ residual tonsils with sl swelling behind RIGHT posterior tonsillar pillar.  By flexible laryngoscopy, NP clear.  OP with asymmetric lateral swelling on RIGHT.  BOT nl.  Larynx with drying and vocal cord polyps.   Neuro:  Cranial nerves intact Neck:  muscular.  No palpable adenopathy.  Studies Reviewed:  CT neck    Assessment/Plan Clincal story c/w acute PTA.  Risk factors for OP Ca.  No gross lesions identified.  Plan:  To OR later today for I&D RIGHT PTA, possible tonsil bx, possible tonsillectomy (RIGHT).  Discussed with pt.  Questions were answered and informed consent obtained. Cont IV Clinda for now.   Jodi Marble 2/88/3374, 12:42 PM

## 2016-06-09 NOTE — Anesthesia Postprocedure Evaluation (Signed)
Anesthesia Post Note  Patient: [name]  Procedure(s) Performed: Procedure(s) (LRB): RIGHT TONSILECTOMY (Right) INCISION AND DRAINAGE OF PERITONSILLAR ABCESS (Right)  Patient location during evaluation: PACU Anesthesia Type: General Level of consciousness: awake and alert Pain management: pain level controlled Vital Signs Assessment: post-procedure vital signs reviewed and stable Respiratory status: spontaneous breathing, nonlabored ventilation, respiratory function stable and patient connected to nasal cannula oxygen Cardiovascular status: blood pressure returned to baseline and stable Postop Assessment: no signs of nausea or vomiting Anesthetic complications: no       Last Vitals:  Vitals:   06/09/16 1735 06/09/16 1744  BP: 126/81 137/79  Pulse: 85 87  Resp: 19 15  Temp:  36.7 C    Last Pain:  Vitals:   06/09/16 1744  TempSrc:   PainSc: 3                  Young Mulvey DAVID

## 2016-06-09 NOTE — Anesthesia Procedure Notes (Signed)
Procedure Name: Intubation Date/Time: 06/09/2016 4:11 PM Performed by: Rejeana Brock L Pre-anesthesia Checklist: Patient identified, Emergency Drugs available, Suction available and Patient being monitored Patient Re-evaluated:Patient Re-evaluated prior to inductionOxygen Delivery Method: Circle System Utilized Preoxygenation: Pre-oxygenation with 100% oxygen Intubation Type: IV induction Ventilation: Mask ventilation without difficulty and Oral airway inserted - appropriate to patient size Laryngoscope Size: Mac and 3 Grade View: Grade I Tube type: Oral Tube size: 7.0 mm Number of attempts: 1 Airway Equipment and Method: Stylet and Oral airway Placement Confirmation: ETT inserted through vocal cords under direct vision,  positive ETCO2 and breath sounds checked- equal and bilateral Secured at: 22 cm Tube secured with: Tape Dental Injury: Teeth and Oropharynx as per pre-operative assessment

## 2016-06-10 ENCOUNTER — Encounter (HOSPITAL_COMMUNITY): Payer: Self-pay | Admitting: Otolaryngology

## 2016-06-10 LAB — CBC
HEMATOCRIT: 33.8 % — AB (ref 36.0–46.0)
HEMOGLOBIN: 10.9 g/dL — AB (ref 12.0–15.0)
MCH: 28.6 pg (ref 26.0–34.0)
MCHC: 32.2 g/dL (ref 30.0–36.0)
MCV: 88.7 fL (ref 78.0–100.0)
Platelets: 237 10*3/uL (ref 150–400)
RBC: 3.81 MIL/uL — AB (ref 3.87–5.11)
RDW: 13.7 % (ref 11.5–15.5)
WBC: 21.2 10*3/uL — ABNORMAL HIGH (ref 4.0–10.5)

## 2016-06-10 LAB — GLUCOSE, CAPILLARY
GLUCOSE-CAPILLARY: 145 mg/dL — AB (ref 65–99)
GLUCOSE-CAPILLARY: 124 mg/dL — AB (ref 65–99)
Glucose-Capillary: 105 mg/dL — ABNORMAL HIGH (ref 65–99)
Glucose-Capillary: 105 mg/dL — ABNORMAL HIGH (ref 65–99)
Glucose-Capillary: 134 mg/dL — ABNORMAL HIGH (ref 65–99)
Glucose-Capillary: 183 mg/dL — ABNORMAL HIGH (ref 65–99)

## 2016-06-10 LAB — BASIC METABOLIC PANEL
Anion gap: 7 (ref 5–15)
BUN: 14 mg/dL (ref 6–20)
CHLORIDE: 96 mmol/L — AB (ref 101–111)
CO2: 31 mmol/L (ref 22–32)
CREATININE: 0.92 mg/dL (ref 0.44–1.00)
Calcium: 8.7 mg/dL — ABNORMAL LOW (ref 8.9–10.3)
GFR calc Af Amer: >60 mL/min (ref >60)
GFR calc non Af Amer: >60 mL/min (ref >60)
Glucose, Bld: 142 mg/dL — ABNORMAL HIGH (ref 65–99)
POTASSIUM: 4.4 mmol/L (ref 3.5–5.1)
SODIUM: 134 mmol/L — AB (ref 135–145)

## 2016-06-10 LAB — HEMOGLOBIN A1C
HEMOGLOBIN A1C: 5.9 % — AB (ref 4.8–5.6)
MEAN PLASMA GLUCOSE: 123 mg/dL

## 2016-06-10 MED ORDER — ACYCLOVIR 800 MG PO TABS
800.0000 mg | ORAL_TABLET | Freq: Every day | ORAL | Status: DC
  Administered 2016-06-10 – 2016-06-12 (×11): 800 mg via ORAL
  Filled 2016-06-10 (×12): qty 1

## 2016-06-10 MED ORDER — OXYCODONE HCL 5 MG/5ML PO SOLN
5.0000 mg | ORAL | Status: DC | PRN
  Administered 2016-06-10 – 2016-06-12 (×10): 10 mg via ORAL
  Filled 2016-06-10 (×10): qty 10

## 2016-06-10 MED ORDER — LIDOCAINE VISCOUS 2 % MT SOLN
15.0000 mL | OROMUCOSAL | Status: DC | PRN
  Administered 2016-06-10 – 2016-06-12 (×4): 15 mL via OROMUCOSAL
  Filled 2016-06-10 (×7): qty 15

## 2016-06-10 MED ORDER — MAGIC MOUTHWASH W/LIDOCAINE
5.0000 mL | Freq: Four times a day (QID) | ORAL | Status: DC
  Administered 2016-06-10 – 2016-06-12 (×10): 5 mL via ORAL
  Filled 2016-06-10 (×11): qty 5

## 2016-06-10 MED ORDER — SODIUM CHLORIDE 0.9 % IV SOLN
INTRAVENOUS | Status: DC
  Administered 2016-06-10 – 2016-06-11 (×3): via INTRAVENOUS

## 2016-06-10 MED ORDER — PANTOPRAZOLE SODIUM 40 MG PO PACK
40.0000 mg | PACK | Freq: Two times a day (BID) | ORAL | Status: DC
  Administered 2016-06-10 – 2016-06-12 (×4): 40 mg
  Filled 2016-06-10 (×4): qty 20

## 2016-06-10 NOTE — Progress Notes (Signed)
Patient states she can place herself on CPAP when ready. RT will continue to monitor.  

## 2016-06-10 NOTE — Progress Notes (Signed)
06/10/2016 12:06 PM  [name], [name] [id number]  Post-Op Day 1    Temp:  [97.8 F (36.6 C)-98.6 F (37 C)] 98.2 F (36.8 C) (03/14 0438) Pulse Rate:  [71-114] 71 (03/14 0438) Resp:  [15-20] 18 (03/14 0438) BP: (100-137)/(59-87) 101/63 (03/14 0438) SpO2:  [94 %-100 %] 98 % (03/14 0438) Weight:  [95.4 kg (210 lb 4.8 oz)] 95.4 kg (210 lb 4.8 oz) (03/14 0438),     Intake/Output Summary (Last 24 hours) at 06/10/16 1206 Last data filed at 06/10/16 0943  Gross per 24 hour  Intake             1645 ml  Output             1315 ml  Net              330 ml    Results for orders placed or performed during the hospital encounter of 06/08/16 (from the past 24 hour(s))  Glucose, capillary     Status: Abnormal   Collection Time: 06/09/16 12:09 PM  Result Value Ref Range   Glucose-Capillary 161 (H) 65 - 99 mg/dL  Troponin I (q 6hr x 3)     Status: None   Collection Time: 06/09/16  2:20 PM  Result Value Ref Range   Troponin I <0.03 <0.03 ng/mL  Surgical pcr screen     Status: None   Collection Time: 06/09/16  3:11 PM  Result Value Ref Range   MRSA, PCR NEGATIVE NEGATIVE   Staphylococcus aureus NEGATIVE NEGATIVE  Glucose, capillary     Status: Abnormal   Collection Time: 06/09/16  3:11 PM  Result Value Ref Range   Glucose-Capillary 140 (H) 65 - 99 mg/dL  Glucose, capillary     Status: Abnormal   Collection Time: 06/09/16  5:09 PM  Result Value Ref Range   Glucose-Capillary 154 (H) 65 - 99 mg/dL   Comment 1 Notify RN   Glucose, capillary     Status: Abnormal   Collection Time: 06/09/16  8:13 PM  Result Value Ref Range   Glucose-Capillary 135 (H) 65 - 99 mg/dL   Comment 1 Notify RN    Comment 2 Document in Chart   Glucose, capillary     Status: Abnormal   Collection Time: 06/10/16 12:39 AM  Result Value Ref Range   Glucose-Capillary 183 (H) 65 - 99 mg/dL   Comment 1 Notify RN    Comment 2 Document in Chart   Glucose, capillary     Status: Abnormal   Collection Time:  06/10/16  4:37 AM  Result Value Ref Range   Glucose-Capillary 145 (H) 65 - 99 mg/dL   Comment 1 Notify RN    Comment 2 Document in Chart   CBC     Status: Abnormal   Collection Time: 06/10/16  5:31 AM  Result Value Ref Range   WBC 21.2 (H) 4.0 - 10.5 K/uL   RBC 3.81 (L) 3.87 - 5.11 MIL/uL   Hemoglobin 10.9 (L) 12.0 - 15.0 g/dL   HCT 07.3 (L) 71.0 - 62.6 %   MCV 88.7 78.0 - 100.0 fL   MCH 28.6 26.0 - 34.0 pg   MCHC 32.2 30.0 - 36.0 g/dL   RDW 94.8 54.6 - 27.0 %   Platelets 237 150 - 400 K/uL  Basic metabolic panel     Status: Abnormal   Collection Time: 06/10/16  5:31 AM  Result Value Ref Range   Sodium 134 (L) 135 - 145 mmol/L  Potassium 4.4 3.5 - 5.1 mmol/L   Chloride 96 (L) 101 - 111 mmol/L   CO2 31 22 - 32 mmol/L   Glucose, Bld 142 (H) 65 - 99 mg/dL   BUN 14 6 - 20 mg/dL   Creatinine, Ser 8.54 0.44 - 1.00 mg/dL   Calcium 8.7 (L) 8.9 - 10.3 mg/dL   GFR calc non Af Amer >60 >60 mL/min   GFR calc Af Amer >60 >60 mL/min   Anion gap 7 5 - 15  Glucose, capillary     Status: Abnormal   Collection Time: 06/10/16  7:30 AM  Result Value Ref Range   Glucose-Capillary 124 (H) 65 - 99 mg/dL   Comment 1 Notify RN   Glucose, capillary     Status: Abnormal   Collection Time: 06/10/16 11:47 AM  Result Value Ref Range   Glucose-Capillary 105 (H) 65 - 99 mg/dL   Comment 1 Notify RN     SUBJECTIVE:  Reports large pain not relieved by Hydrocodone.  Still cannot swallow well.   OBJECTIVE:  Fossa clean and dry.  Voice clear.  IMPRESSION:  Satisfactory pop check.  PLAN:  Will wait a few days.  If she is still having swallowing issues will have to address this further.  For now will add Oxycodone, viscous xylocaine, Protonix for GERD, Acyclovir for shingles.  Await pathology.  Flo Shanks

## 2016-06-10 NOTE — Progress Notes (Signed)
Pt states that she takes acyclovir for shingles.  Received order for same.  After assessing the pt, she does have blisters x2 in the sacral crevice, however, they are healing, and not weeping; therefore, no airborne precautions should be necessary/

## 2016-06-10 NOTE — Progress Notes (Signed)
PROGRESS NOTE  Vanessa [name]  JOI:[id number] DOB: 11-02-1955 DOA: 06/08/2016 PCP: Gilda Crease, MD Outpatient Specialists:  Subjective: Status post right-sided tonsillectomy, still complaining about a lot of pain in the back of her throat. Worsening of white blood cell count  Brief Narrative:  Vanessa [name] is a 61 y.o. female with medical history significant of HTN, DM 2 alcohol abuse, tobacco abuse, chronic back pain, OSA asthma/COPD, Graves' disease,  depression   Presented with three-day history of throat swelling and pain with difficulty swallowing she is being evaluated in the emergency room for this twice in the past 3 days. She was seen and started on antibiotics clindamycin and steroids yesterday but continued to have significant swelling and pain and was scared that she is going to choke so she presented to emergency department again.  During prior evaluation exam was unremarkable and Rapid strep was negative she was discharged home. She is unsure if had any fevers she had been having intermittent abdominal pain and chest pains or past one month. Regarding pertinent Chronic problems: History of COPD continues to smoke Hx of sleep apnea on CPAP  Assessment & Plan:   Principal Problem:   Pharyngeal mass Active Problems:   OSA (obstructive sleep apnea)   Diabetes mellitus (HCC)   Graves' disease with exophthalmos   Morbid obesity (HCC)   Essential hypertension   Alcohol abuse, in remission   Pharyngeal mass/right side tonsillar abscess -Patient presented with pharyngeal mass and difficulty swallowing and managing her on secretions. -ENT consulted, patient was taken to the OR yesterday with right-sided tonsillectomy. -Per ENT operative note patient did have right side tonsillar abscess. Viral panel and rapid strep antigen negative. -Worsening of WBCs, continue current antibiotics, and Magic mouthwash. Check CBC in a.m.  Alcohol abuse -Currently in remission,  denies any recent use.  Graves' disease with exophthalmos -Normal TSH, continue current Synthroid dose. Stable  OSA -On CPAP at daytime, continue. Stable  Hyponatremia -BMP reviewed, slight hyponatremia at 134, change IV fluids to normal saline, check BMP in a.m.  DVT prophylaxis: SCDs Code Status: Full Code Family Communication:  Disposition Plan:  Diet: Diet full liquid Room service appropriate? Yes; Fluid consistency: Thin  Consultants:   ENT  Procedures:   Right-sided tonsillectomy  Antimicrobials:   Clindamycin   Objective: Vitals:   06/09/16 2137 06/10/16 0029 06/10/16 0041 06/10/16 0438  BP: 102/64  (!) 100/59 101/63  Pulse: 84 83 73 71  Resp: 18 18 16 18   Temp: 97.8 F (36.6 C)  98.5 F (36.9 C) 98.2 F (36.8 C)  TempSrc: Oral  Oral Oral  SpO2: 99% 96% 98% 98%  Weight:    95.4 kg (210 lb 4.8 oz)  Height:        Intake/Output Summary (Last 24 hours) at 06/10/16 0943 Last data filed at 06/10/16 0943  Gross per 24 hour  Intake             1645 ml  Output             1315 ml  Net              330 ml   Filed Weights   06/08/16 2109 06/09/16 0847 06/10/16 0438  Weight: 100.4 kg (221 lb 5 oz) 95.6 kg (210 lb 12.8 oz) 95.4 kg (210 lb 4.8 oz)    Examination: General exam: Appears calm and comfortable  Respiratory system: Clear to auscultation. Respiratory effort normal. Cardiovascular system: S1 & S2 heard, RRR. No JVD,  murmurs, rubs, gallops or clicks. No pedal edema. Gastrointestinal system: Abdomen is nondistended, soft and nontender. No organomegaly or masses felt. Normal bowel sounds heard. Central nervous system: Alert and oriented. No focal neurological deficits. Extremities: Symmetric 5 x 5 power. Skin: No rashes, lesions or ulcers Psychiatry: Judgement and insight appear normal. Mood & affect appropriate.   Data Reviewed: I have personally reviewed following labs and imaging studies  CBC:  Recent Labs Lab 06/08/16 2157 06/09/16 0730  06/10/16 0531  WBC 17.8* 18.6* 21.2*  NEUTROABS 13.9*  --   --   HGB 11.2* 11.2* 10.9*  HCT 34.2* 34.1* 33.8*  MCV 88.4 87.9 88.7  PLT 198 210 237   Basic Metabolic Panel:  Recent Labs Lab 06/08/16 2157 06/09/16 0730 06/10/16 0531  NA 138 135 134*  K 4.0 4.8 4.4  CL 100* 100* 96*  CO2 28 25 31   GLUCOSE 111* 246* 142*  BUN 10 11 14   CREATININE 1.16* 0.95 0.92  CALCIUM 9.1 8.8* 8.7*  MG  --  1.6*  --   PHOS  --  3.3  --    GFR: Estimated Creatinine Clearance: 82 mL/min (by C-G formula based on SCr of 0.92 mg/dL). Liver Function Tests:  Recent Labs Lab 06/09/16 0730  AST 17  ALT 19  ALKPHOS 45  BILITOT 0.3  PROT 6.3*  ALBUMIN 3.7   No results for input(s): LIPASE, AMYLASE in the last 168 hours. No results for input(s): AMMONIA in the last 168 hours. Coagulation Profile: No results for input(s): INR, PROTIME in the last 168 hours. Cardiac Enzymes:  Recent Labs Lab 06/09/16 0128 06/09/16 0730 06/09/16 1420  TROPONINI <0.03 <0.03 <0.03   BNP (last 3 results) No results for input(s): PROBNP in the last 8760 hours. HbA1C:  Recent Labs  06/09/16 0730  HGBA1C 5.9*   CBG:  Recent Labs Lab 06/09/16 1709 06/09/16 2013 06/10/16 0039 06/10/16 0437 06/10/16 0730  GLUCAP 154* 135* 183* 145* 124*   Lipid Profile: No results for input(s): CHOL, HDL, LDLCALC, TRIG, CHOLHDL, LDLDIRECT in the last 72 hours. Thyroid Function Tests:  Recent Labs  06/09/16 0730  TSH 2.008   Anemia Panel: No results for input(s): VITAMINB12, FOLATE, FERRITIN, TIBC, IRON, RETICCTPCT in the last 72 hours. Urine analysis:    Component Value Date/Time   COLORURINE YELLOW 03/18/2016 0233   APPEARANCEUR HAZY (A) 03/18/2016 0233   LABSPEC 1.010 03/18/2016 0233   PHURINE 5.0 03/18/2016 0233   GLUCOSEU NEGATIVE 03/18/2016 0233   HGBUR NEGATIVE 03/18/2016 0233   BILIRUBINUR NEGATIVE 03/18/2016 0233   KETONESUR NEGATIVE 03/18/2016 0233   PROTEINUR NEGATIVE 03/18/2016 0233    UROBILINOGEN 0.2 03/12/2016 1308   NITRITE NEGATIVE 03/18/2016 0233   LEUKOCYTESUR NEGATIVE 03/18/2016 0233   Sepsis Labs: @LABRCNTIP (procalcitonin:4,lacticidven:4)  ) Recent Results (from the past 240 hour(s))  Rapid strep screen     Status: None   Collection Time: 06/06/16  9:35 PM  Result Value Ref Range Status   Streptococcus, Group A Screen (Direct) NEGATIVE NEGATIVE Final    Comment: (NOTE) A Rapid Antigen test may result negative if the antigen level in the sample is below the detection level of this test. The FDA has not cleared this test as a stand-alone test therefore the rapid antigen negative result has reflexed to a Group A Strep culture.   Culture, group A strep     Status: None   Collection Time: 06/06/16  9:35 PM  Result Value Ref Range Status   Specimen Description THROAT  Final   Special Requests NONE Reflexed from [id number]  Final   Culture NO GROUP A STREP (S.PYOGENES) ISOLATED  Final   Report Status 06/09/2016 FINAL  Final  Respiratory Panel by PCR     Status: None   Collection Time: 06/09/16  1:29 AM  Result Value Ref Range Status   Adenovirus NOT DETECTED NOT DETECTED Final   Coronavirus 229E NOT DETECTED NOT DETECTED Final   Coronavirus HKU1 NOT DETECTED NOT DETECTED Final   Coronavirus NL63 NOT DETECTED NOT DETECTED Final   Coronavirus OC43 NOT DETECTED NOT DETECTED Final   Metapneumovirus NOT DETECTED NOT DETECTED Final   Rhinovirus / Enterovirus NOT DETECTED NOT DETECTED Final   Influenza A NOT DETECTED NOT DETECTED Final   Influenza B NOT DETECTED NOT DETECTED Final   Parainfluenza Virus 1 NOT DETECTED NOT DETECTED Final   Parainfluenza Virus 2 NOT DETECTED NOT DETECTED Final   Parainfluenza Virus 3 NOT DETECTED NOT DETECTED Final   Parainfluenza Virus 4 NOT DETECTED NOT DETECTED Final   Respiratory Syncytial Virus NOT DETECTED NOT DETECTED Final   Bordetella pertussis NOT DETECTED NOT DETECTED Final   Chlamydophila pneumoniae NOT DETECTED NOT  DETECTED Final   Mycoplasma pneumoniae NOT DETECTED NOT DETECTED Final  Surgical pcr screen     Status: None   Collection Time: 06/09/16  3:11 PM  Result Value Ref Range Status   MRSA, PCR NEGATIVE NEGATIVE Final   Staphylococcus aureus NEGATIVE NEGATIVE Final    Comment:        The Xpert SA Assay (FDA approved for NASAL specimens in patients over 26 years of age), is one component of a comprehensive surveillance program.  Test performance has been validated by [facility] for patients greater than or equal to 76 year old. It is not intended to diagnose infection nor to guide or monitor treatment.      Invalid input(s): PROCALCITONIN, LACTICACIDVEN   Radiology Studies: Ct Soft Tissue Neck W Contrast  Result Date: 06/09/2016 CLINICAL DATA:  Sore throat. History of diabetes, COPD, grave's disease. EXAM: CT NECK WITH CONTRAST TECHNIQUE: Multidetector CT imaging of the neck was performed using the standard protocol following the bolus administration of intravenous contrast. CONTRAST:  75mL ISOVUE-300 IOPAMIDOL (ISOVUE-300) INJECTION 61% COMPARISON:  Cervical spine radiographs April 14, 2015 FINDINGS: PHARYNX AND LARYNX: 2 x 1.2 x 3.3 cm (transverse by AP by CC) ill-defined cystic and solid mass RIGHT palatine tonsil extending to the base of tongue. No superimposed findings of acute tonsillitis. Mild RIGHT parapharyngeal fat stranding. Edema and mass effect extending to RIGHT hypopharynx with medial deviation RIGHT cricoarytenoid joint. However, symmetric piriform sinuses and normal larynx. SALIVARY GLANDS: Normal. THYROID: Trace residual thyroid tissue. LYMPH NODES: No lymphadenopathy by CT size criteria. However, 7 mm retropharyngeal lymph node. 8 mm short access RIGHT level IIa lymph node. VASCULAR: Normal. LIMITED INTRACRANIAL: Normal. VISUALIZED ORBITS: Nonacute. Old LEFT orbital floor blowout fracture. MASTOIDS AND VISUALIZED PARANASAL SINUSES: Well-aerated. SKELETON: Nonacute. Patient  is edentulous. Mild degenerative change of the cervical spine. UPPER CHEST: Lung apices are clear. No superior mediastinal lymphadenopathy. OTHER: None. IMPRESSION: Cystic and solid 2 x 1.2 x 3.3 cm RIGHT palatine tonsil to base of tongue mass, this may be simple abscess though superinfected neoplasm or, necrotic neoplasm is suspected (including 7 mm RIGHT retropharyngeal lymph node). Patent airway. Recommend follow-up ENT consultation after treatment. Small amount of residual thyroid tissue. Acute findings discussed with and reconfirmed by PA KELLY GEKAS on 06/08/2016 at 11:56 pm. Electronically Signed  By: Awilda Metro M.D.   On: 06/09/2016 00:00        Scheduled Meds: . atorvastatin  40 mg Oral q1800  . busPIRone  20 mg Oral BID  . clindamycin (CLEOCIN) IV  600 mg Intravenous Q8H  . cycloSPORINE  1 drop Both Eyes BID  . gabapentin  300 mg Oral Q8H  . insulin aspart  0-9 Units Subcutaneous Q4H  . levothyroxine  87.5 mcg Intravenous Daily  . QUEtiapine  50 mg Oral QHS  . sodium chloride flush  3 mL Intravenous Q12H  . verapamil  40 mg Oral TID   Continuous Infusions:   LOS: 1 day    Time spent: 35 minutes    Elky Funches A, MD [facility] Pager [phone]  If 7PM-7AM, please contact night-coverage [email or web address] Password [facility] 06/10/2016, 9:43 AM

## 2016-06-11 LAB — GLUCOSE, CAPILLARY
GLUCOSE-CAPILLARY: 103 mg/dL — AB (ref 65–99)
GLUCOSE-CAPILLARY: 112 mg/dL — AB (ref 65–99)
GLUCOSE-CAPILLARY: 118 mg/dL — AB (ref 65–99)
Glucose-Capillary: 100 mg/dL — ABNORMAL HIGH (ref 65–99)
Glucose-Capillary: 163 mg/dL — ABNORMAL HIGH (ref 65–99)
Glucose-Capillary: 180 mg/dL — ABNORMAL HIGH (ref 65–99)

## 2016-06-11 LAB — BASIC METABOLIC PANEL
ANION GAP: 8 (ref 5–15)
BUN: 12 mg/dL (ref 6–20)
CALCIUM: 8.4 mg/dL — AB (ref 8.9–10.3)
CO2: 28 mmol/L (ref 22–32)
Chloride: 101 mmol/L (ref 101–111)
Creatinine, Ser: 0.86 mg/dL (ref 0.44–1.00)
GFR calc Af Amer: >60 mL/min (ref >60)
GFR calc non Af Amer: >60 mL/min (ref >60)
GLUCOSE: 108 mg/dL — AB (ref 65–99)
Potassium: 4.4 mmol/L (ref 3.5–5.1)
Sodium: 137 mmol/L (ref 135–145)

## 2016-06-11 LAB — CBC
HEMATOCRIT: 33.6 % — AB (ref 36.0–46.0)
Hemoglobin: 11.2 g/dL — ABNORMAL LOW (ref 12.0–15.0)
MCH: 29.4 pg (ref 26.0–34.0)
MCHC: 33.3 g/dL (ref 30.0–36.0)
MCV: 88.2 fL (ref 78.0–100.0)
Platelets: 193 10*3/uL (ref 150–400)
RBC: 3.81 MIL/uL — ABNORMAL LOW (ref 3.87–5.11)
RDW: 13.6 % (ref 11.5–15.5)
WBC: 12 10*3/uL — AB (ref 4.0–10.5)

## 2016-06-11 NOTE — Progress Notes (Signed)
Patient states she is able to place self on and off CPAP when ready.

## 2016-06-11 NOTE — Progress Notes (Signed)
06/11/2016 12:48 PM  [name], [name] [id number]  Post-Op Day 2    Temp:  [98.4 F (36.9 C)-99.9 F (37.7 C)] 99.9 F (37.7 C) (03/15 1100) Pulse Rate:  [66-84] 76 (03/15 1100) Resp:  [18-22] 22 (03/15 1100) BP: (108-137)/(55-82) 135/82 (03/15 1100) SpO2:  [96 %-100 %] 96 % (03/15 0800) Weight:  [96.1 kg (211 lb 12.8 oz)] 96.1 kg (211 lb 12.8 oz) (03/15 0416),     Intake/Output Summary (Last 24 hours) at 06/11/16 1248 Last data filed at 06/11/16 1000  Gross per 24 hour  Intake          2691.25 ml  Output             3900 ml  Net         -1208.75 ml    Results for orders placed or performed during the hospital encounter of 06/08/16 (from the past 24 hour(s))  Glucose, capillary     Status: Abnormal   Collection Time: 06/10/16  4:17 PM  Result Value Ref Range   Glucose-Capillary 134 (H) 65 - 99 mg/dL   Comment 1 Notify RN   Glucose, capillary     Status: Abnormal   Collection Time: 06/10/16  7:50 PM  Result Value Ref Range   Glucose-Capillary 105 (H) 65 - 99 mg/dL   Comment 1 Notify RN    Comment 2 Document in Chart   Glucose, capillary     Status: Abnormal   Collection Time: 06/11/16 12:22 AM  Result Value Ref Range   Glucose-Capillary 112 (H) 65 - 99 mg/dL   Comment 1 Notify RN    Comment 2 Document in Chart   Glucose, capillary     Status: Abnormal   Collection Time: 06/11/16  4:12 AM  Result Value Ref Range   Glucose-Capillary 100 (H) 65 - 99 mg/dL   Comment 1 Notify RN    Comment 2 Document in Chart   CBC     Status: Abnormal   Collection Time: 06/11/16  4:41 AM  Result Value Ref Range   WBC 12.0 (H) 4.0 - 10.5 K/uL   RBC 3.81 (L) 3.87 - 5.11 MIL/uL   Hemoglobin 11.2 (L) 12.0 - 15.0 g/dL   HCT 62.6 (L) 94.8 - 54.6 %   MCV 88.2 78.0 - 100.0 fL   MCH 29.4 26.0 - 34.0 pg   MCHC 33.3 30.0 - 36.0 g/dL   RDW 27.0 35.0 - 09.3 %   Platelets 193 150 - 400 K/uL  Basic metabolic panel     Status: Abnormal   Collection Time: 06/11/16  4:41 AM  Result Value  Ref Range   Sodium 137 135 - 145 mmol/L   Potassium 4.4 3.5 - 5.1 mmol/L   Chloride 101 101 - 111 mmol/L   CO2 28 22 - 32 mmol/L   Glucose, Bld 108 (H) 65 - 99 mg/dL   BUN 12 6 - 20 mg/dL   Creatinine, Ser 8.18 0.44 - 1.00 mg/dL   Calcium 8.4 (L) 8.9 - 10.3 mg/dL   GFR calc non Af Amer >60 >60 mL/min   GFR calc Af Amer >60 >60 mL/min   Anion gap 8 5 - 15  Glucose, capillary     Status: Abnormal   Collection Time: 06/11/16  7:47 AM  Result Value Ref Range   Glucose-Capillary 118 (H) 65 - 99 mg/dL   Comment 1 Notify RN   Glucose, capillary     Status: Abnormal   Collection Time: 06/11/16 11:22  AM  Result Value Ref Range   Glucose-Capillary 163 (H) 65 - 99 mg/dL   Comment 1 Notify RN     SUBJECTIVE:  Still significant throat pain.  Able to swallow liquids.  OBJECTIVE:   Voice clear. Breathing well.  Fossa healing.  IMPRESSION:  improving  PLAN:  OK for discharge.  Does not need to wait for pathology report.  Recheck my office 2 weeks if doing well, sooner as needed.  Flo Shanks

## 2016-06-11 NOTE — Progress Notes (Signed)
Pt now refusing bed alarm tonight. Will continue to monitor.

## 2016-06-11 NOTE — Progress Notes (Signed)
PROGRESS NOTE  Vanessa [name]  MCN:[id number] DOB: 16-Jun-1955 DOA: 06/08/2016 PCP: Gilda Crease, MD Outpatient Specialists:  Subjective: Feels better, although she still complaining about severe pain and how she still needs the suction, she consistent 100% of her meals. Discharge home whenever it's okay with ENT.  Brief Narrative:  Vanessa [name] is a 61 y.o. female with medical history significant of HTN, DM 2 alcohol abuse, tobacco abuse, chronic back pain, OSA asthma/COPD, Graves' disease,  depression   Presented with three-day history of throat swelling and pain with difficulty swallowing she is being evaluated in the emergency room for this twice in the past 3 days. She was seen and started on antibiotics clindamycin and steroids yesterday but continued to have significant swelling and pain and was scared that she is going to choke so she presented to emergency department again.  During prior evaluation exam was unremarkable and Rapid strep was negative she was discharged home. She is unsure if had any fevers she had been having intermittent abdominal pain and chest pains or past one month. Regarding pertinent Chronic problems: History of COPD continues to smoke Hx of sleep apnea on CPAP  Assessment & Plan:   Principal Problem:   Pharyngeal mass Active Problems:   OSA (obstructive sleep apnea)   Diabetes mellitus (HCC)   Graves' disease with exophthalmos   Morbid obesity (HCC)   Essential hypertension   Alcohol abuse, in remission   Pharyngeal mass/right side tonsillar abscess -Patient presented with pharyngeal mass and difficulty swallowing and managing her on secretions. -ENT consulted, patient was taken to the OR on 06/09/16 with right-sided tonsillectomy. -Per ENT operative note patient did have right side tonsillar abscess. Viral panel and rapid strep antigen negative. -WBC improved to 12 today, appears much better. -Await final recommendation from ENT, likely  can discharge home later today or tomorrow morning.  Alcohol abuse -Currently in remission, denies any recent use.  Graves' disease with exophthalmos -Normal TSH, continue current Synthroid dose. Stable  OSA -On CPAP at daytime, continue. Stable  Hyponatremia -BMP reviewed, slight hyponatremia at 134, change IV fluids to normal saline, check BMP in a.m.  DVT prophylaxis: SCDs Code Status: Full Code Family Communication:  Disposition Plan:  Diet: Diet full liquid Room service appropriate? Yes; Fluid consistency: Thin  Consultants:   ENT  Procedures:   Right-sided tonsillectomy  Antimicrobials:   Clindamycin   Objective: Vitals:   06/11/16 0026 06/11/16 0416 06/11/16 0800 06/11/16 1100  BP: 120/79 (!) 114/55 137/77 135/82  Pulse: 66 66 84 76  Resp: 18 20 20  (!) 22  Temp:  98.5 F (36.9 C)  99.9 F (37.7 C)  TempSrc:  Oral  Oral  SpO2: 100% 98% 96%   Weight:  96.1 kg (211 lb 12.8 oz)    Height:        Intake/Output Summary (Last 24 hours) at 06/11/16 1128 Last data filed at 06/11/16 1000  Gross per 24 hour  Intake          2691.25 ml  Output             3900 ml  Net         -1208.75 ml   Filed Weights   06/09/16 0847 06/10/16 0438 06/11/16 0416  Weight: 95.6 kg (210 lb 12.8 oz) 95.4 kg (210 lb 4.8 oz) 96.1 kg (211 lb 12.8 oz)    Examination: General exam: Appears calm and comfortable  Respiratory system: Clear to auscultation. Respiratory effort normal. Cardiovascular system: S1 &  S2 heard, RRR. No JVD, murmurs, rubs, gallops or clicks. No pedal edema. Gastrointestinal system: Abdomen is nondistended, soft and nontender. No organomegaly or masses felt. Normal bowel sounds heard. Central nervous system: Alert and oriented. No focal neurological deficits. Extremities: Symmetric 5 x 5 power. Skin: No rashes, lesions or ulcers Psychiatry: Judgement and insight appear normal. Mood & affect appropriate.   Data Reviewed: I have personally reviewed following  labs and imaging studies  CBC:  Recent Labs Lab 06/08/16 2157 06/09/16 0730 06/10/16 0531 06/11/16 0441  WBC 17.8* 18.6* 21.2* 12.0*  NEUTROABS 13.9*  --   --   --   HGB 11.2* 11.2* 10.9* 11.2*  HCT 34.2* 34.1* 33.8* 33.6*  MCV 88.4 87.9 88.7 88.2  PLT 198 210 237 193   Basic Metabolic Panel:  Recent Labs Lab 06/08/16 2157 06/09/16 0730 06/10/16 0531 06/11/16 0441  NA 138 135 134* 137  K 4.0 4.8 4.4 4.4  CL 100* 100* 96* 101  CO2 28 25 31 28   GLUCOSE 111* 246* 142* 108*  BUN 10 11 14 12   CREATININE 1.16* 0.95 0.92 0.86  CALCIUM 9.1 8.8* 8.7* 8.4*  MG  --  1.6*  --   --   PHOS  --  3.3  --   --    GFR: Estimated Creatinine Clearance: 88 mL/min (by C-G formula based on SCr of 0.86 mg/dL). Liver Function Tests:  Recent Labs Lab 06/09/16 0730  AST 17  ALT 19  ALKPHOS 45  BILITOT 0.3  PROT 6.3*  ALBUMIN 3.7   No results for input(s): LIPASE, AMYLASE in the last 168 hours. No results for input(s): AMMONIA in the last 168 hours. Coagulation Profile: No results for input(s): INR, PROTIME in the last 168 hours. Cardiac Enzymes:  Recent Labs Lab 06/09/16 0128 06/09/16 0730 06/09/16 1420  TROPONINI <0.03 <0.03 <0.03   BNP (last 3 results) No results for input(s): PROBNP in the last 8760 hours. HbA1C:  Recent Labs  06/09/16 0730  HGBA1C 5.9*   CBG:  Recent Labs Lab 06/10/16 1950 06/11/16 0022 06/11/16 0412 06/11/16 0747 06/11/16 1122  GLUCAP 105* 112* 100* 118* 163*   Lipid Profile: No results for input(s): CHOL, HDL, LDLCALC, TRIG, CHOLHDL, LDLDIRECT in the last 72 hours. Thyroid Function Tests:  Recent Labs  06/09/16 0730  TSH 2.008   Anemia Panel: No results for input(s): VITAMINB12, FOLATE, FERRITIN, TIBC, IRON, RETICCTPCT in the last 72 hours. Urine analysis:    Component Value Date/Time   COLORURINE YELLOW 03/18/2016 0233   APPEARANCEUR HAZY (A) 03/18/2016 0233   LABSPEC 1.010 03/18/2016 0233   PHURINE 5.0 03/18/2016 0233     GLUCOSEU NEGATIVE 03/18/2016 0233   HGBUR NEGATIVE 03/18/2016 0233   BILIRUBINUR NEGATIVE 03/18/2016 0233   KETONESUR NEGATIVE 03/18/2016 0233   PROTEINUR NEGATIVE 03/18/2016 0233   UROBILINOGEN 0.2 03/12/2016 1308   NITRITE NEGATIVE 03/18/2016 0233   LEUKOCYTESUR NEGATIVE 03/18/2016 0233   Sepsis Labs: @LABRCNTIP (procalcitonin:4,lacticidven:4)  ) Recent Results (from the past 240 hour(s))  Rapid strep screen     Status: None   Collection Time: 06/06/16  9:35 PM  Result Value Ref Range Status   Streptococcus, Group A Screen (Direct) NEGATIVE NEGATIVE Final    Comment: (NOTE) A Rapid Antigen test may result negative if the antigen level in the sample is below the detection level of this test. The FDA has not cleared this test as a stand-alone test therefore the rapid antigen negative result has reflexed to a Group A Strep  culture.   Culture, group A strep     Status: None   Collection Time: 06/06/16  9:35 PM  Result Value Ref Range Status   Specimen Description THROAT  Final   Special Requests NONE Reflexed from [id number]  Final   Culture NO GROUP A STREP (S.PYOGENES) ISOLATED  Final   Report Status 06/09/2016 FINAL  Final  Respiratory Panel by PCR     Status: None   Collection Time: 06/09/16  1:29 AM  Result Value Ref Range Status   Adenovirus NOT DETECTED NOT DETECTED Final   Coronavirus 229E NOT DETECTED NOT DETECTED Final   Coronavirus HKU1 NOT DETECTED NOT DETECTED Final   Coronavirus NL63 NOT DETECTED NOT DETECTED Final   Coronavirus OC43 NOT DETECTED NOT DETECTED Final   Metapneumovirus NOT DETECTED NOT DETECTED Final   Rhinovirus / Enterovirus NOT DETECTED NOT DETECTED Final   Influenza A NOT DETECTED NOT DETECTED Final   Influenza B NOT DETECTED NOT DETECTED Final   Parainfluenza Virus 1 NOT DETECTED NOT DETECTED Final   Parainfluenza Virus 2 NOT DETECTED NOT DETECTED Final   Parainfluenza Virus 3 NOT DETECTED NOT DETECTED Final   Parainfluenza Virus 4 NOT  DETECTED NOT DETECTED Final   Respiratory Syncytial Virus NOT DETECTED NOT DETECTED Final   Bordetella pertussis NOT DETECTED NOT DETECTED Final   Chlamydophila pneumoniae NOT DETECTED NOT DETECTED Final   Mycoplasma pneumoniae NOT DETECTED NOT DETECTED Final  Surgical pcr screen     Status: None   Collection Time: 06/09/16  3:11 PM  Result Value Ref Range Status   MRSA, PCR NEGATIVE NEGATIVE Final   Staphylococcus aureus NEGATIVE NEGATIVE Final    Comment:        The Xpert SA Assay (FDA approved for NASAL specimens in patients over 73 years of age), is one component of a comprehensive surveillance program.  Test performance has been validated by [facility] for patients greater than or equal to 10 year old. It is not intended to diagnose infection nor to guide or monitor treatment.      Invalid input(s): PROCALCITONIN, LACTICACIDVEN   Radiology Studies: No results found.      Scheduled Meds: . acyclovir  800 mg Oral 5 X Daily  . atorvastatin  40 mg Oral q1800  . busPIRone  20 mg Oral BID  . clindamycin (CLEOCIN) IV  600 mg Intravenous Q8H  . cycloSPORINE  1 drop Both Eyes BID  . gabapentin  300 mg Oral Q8H  . insulin aspart  0-9 Units Subcutaneous Q4H  . levothyroxine  87.5 mcg Intravenous Daily  . magic mouthwash w/lidocaine  5 mL Oral QID  . pantoprazole sodium  40 mg Per Tube BID AC  . QUEtiapine  50 mg Oral QHS  . sodium chloride flush  3 mL Intravenous Q12H  . verapamil  40 mg Oral TID   Continuous Infusions: . sodium chloride 75 mL/hr at 06/10/16 1458     LOS: 2 days    Time spent: 35 minutes    Mykela Mewborn A, MD [facility] Pager [phone]  If 7PM-7AM, please contact night-coverage [email or web address] Password [facility] 06/11/2016, 11:28 AM

## 2016-06-12 DIAGNOSIS — J029 Acute pharyngitis, unspecified: Secondary | ICD-10-CM

## 2016-06-12 LAB — GLUCOSE, CAPILLARY
GLUCOSE-CAPILLARY: 137 mg/dL — AB (ref 65–99)
GLUCOSE-CAPILLARY: 131 mg/dL — AB (ref 65–99)
Glucose-Capillary: 108 mg/dL — ABNORMAL HIGH (ref 65–99)
Glucose-Capillary: 118 mg/dL — ABNORMAL HIGH (ref 65–99)

## 2016-06-12 MED ORDER — MAGIC MOUTHWASH W/LIDOCAINE: 5.0000 mL | Freq: Three times a day (TID) | ORAL | 0 refills | Status: DC

## 2016-06-12 MED ORDER — OXYCODONE-ACETAMINOPHEN 5-325 MG PO TABS
1.0000 | ORAL_TABLET | ORAL | 0 refills | Status: DC | PRN
Start: 2016-06-12 — End: 2017-02-15

## 2016-06-12 MED ORDER — AMOXICILLIN-POT CLAVULANATE 875-125 MG PO TABS: 1.0000 | ORAL_TABLET | Freq: Two times a day (BID) | ORAL | 0 refills | Status: DC

## 2016-06-12 NOTE — Discharge Summary (Signed)
Physician Discharge Summary  Vanessa [name] WUJ:[id number] DOB: 1955-10-01 DOA: 06/08/2016  PCP: Gilda Crease, MD  Admit date: 06/08/2016 Discharge date: 06/12/2016  Admitted From: Home Disposition: Home  Recommendations for Outpatient Follow-up:  1. Follow up with PCP in 1-2 weeks 2. Please obtain BMP/CBC in one week  Home Health: NA Equipment/Devices:NA  Discharge Condition: Stable CODE STATUS: Full Code Diet recommendation: DIET SOFT Room service appropriate? Yes; Fluid consistency: Thin Diet - low sodium heart healthy  Brief/Interim Summary: Vanessa Harringtonis a 61 y.o.femalewith medical history significant of HTN, DM 2 alcohol abuse, tobacco abuse, chronic back pain, OSA asthma/COPD, Graves' disease, depression  Presented with three-day history of throat swelling and pain with difficulty swallowing she is being evaluated in the emergency room for this twice in the past 3 days. She was seen and started on antibiotics clindamycin and steroids yesterday but continued to have significant swelling and pain and was scared that she is going to choke so she presented to emergency department again. During prior evaluation exam was unremarkable and Rapid strep was negative she was discharged home. She is unsure if had any fevers she had been having intermittent abdominal pain and chest pains or past one month. Regarding pertinent Chronic problems:History of COPD continues to smoke Hx of sleep apnea on CPAP  Discharge Diagnoses:  Principal Problem:   Pharyngeal mass Active Problems:   OSA (obstructive sleep apnea)   Diabetes mellitus (HCC)   Graves' disease with exophthalmos   Morbid obesity (HCC)   Essential hypertension   Alcohol abuse, in remission   Pharyngeal mass/right side tonsillar abscess -Patient presented with pharyngeal mass and difficulty swallowing and managing her on secretions. -ENT consulted, patient was taken to the OR on 06/09/16 with right-sided  tonsillectomy. -Per ENT operative note patient did have right side tonsillar abscess. Viral panel and rapid strep antigen negative. -WBC improved to 12 today, appears much better. -Patient was on clindamycin while she was in the hospital, discharged on Augmentin for 4 more days. -Follow-up with ENT as outpatient in 10-14 days. -Patient complained about pain and requested pain medications on discharge, 20 pills of 5/325 Percocet prescribed. -Also magic mouthwash with lidocaine to control the pain in her back.  Alcohol abuse -Currently in remission, denies any recent use.  Graves' disease with exophthalmos -Normal TSH, continue current Synthroid dose. Stable  OSA -On CPAP at daytime, continue. Stable  Hyponatremia -BMP reviewed, slight hyponatremia at 134, change IV fluids to normal saline, check BMP in a.m.  Discharge Instructions  Discharge Instructions    Diet - low sodium heart healthy    Complete by:  As directed    Increase activity slowly    Complete by:  As directed      Allergies as of 06/12/2016      Reactions   Suboxone [buprenorphine Hcl-naloxone Hcl] Itching   Vaginal itching   Ciprofloxacin Hives, Itching   Hives   Penicillins Hives, Itching   Has patient had a PCN reaction causing immediate rash, facial/tongue/throat swelling, SOB or lightheadedness with hypotension: Yes Has patient had a PCN reaction causing severe rash involving mucus membranes or skin necrosis: No Has patient had a PCN reaction that required hospitalization: No Has patient had a PCN reaction occurring within the last 10 years: No If all of the above answers are "NO", then may proceed with Cephalosporin use.      Medication List    STOP taking these medications   clindamycin 150 MG capsule Commonly known as:  CLEOCIN   promethazine-dextromethorphan 6.25-15 MG/5ML syrup Commonly known as:  PROMETHAZINE-DM   Pseudoeph-Doxylamine-DM-APAP 60-12.08-26-998 MG/30ML Liqd     TAKE these  medications   amoxicillin-clavulanate 875-125 MG tablet Commonly known as:  AUGMENTIN Take 1 tablet by mouth 2 (two) times daily.   atorvastatin 40 MG tablet Commonly known as:  LIPITOR Take 1 tablet (40 mg total) by mouth daily.   busPIRone 10 MG tablet Commonly known as:  BUSPAR Take 20 mg by mouth 2 (two) times daily.   cycloSPORINE 0.05 % ophthalmic emulsion Commonly known as:  RESTASIS Place 1 drop into both eyes 2 (two) times daily.   gabapentin 300 MG capsule Commonly known as:  NEURONTIN Take 300 mg by mouth 3 (three) times daily.   glipiZIDE 10 MG tablet Commonly known as:  GLUCOTROL Take 10 mg by mouth daily before breakfast.   HYDROcodone-acetaminophen 10-325 MG tablet Commonly known as:  NORCO Take 0.5-1 tablets by mouth every 6 (six) hours as needed for severe pain.   ibuprofen 200 MG tablet Commonly known as:  ADVIL,MOTRIN Take 400 mg by mouth every 6 (six) hours as needed (for pain).   ipratropium-albuterol 0.5-2.5 (3) MG/3ML Soln Commonly known as:  DUONEB Take 3 mLs by nebulization every 6 (six) hours as needed (for wheezing or shortness of breath).   levothyroxine 175 MCG tablet Commonly known as:  SYNTHROID, LEVOTHROID Take 1 tablet (175 mcg total) by mouth every morning.   lidocaine 2 % solution Commonly known as:  XYLOCAINE Use as directed 15 mLs in the mouth or throat as needed for mouth pain.   lisinopril 10 MG tablet Commonly known as:  PRINIVIL,ZESTRIL Take 1 tablet (10 mg total) by mouth daily.   loratadine 10 MG tablet Commonly known as:  CLARITIN Take 10 mg by mouth daily.   magic mouthwash w/lidocaine Soln Take 5 mLs by mouth 3 (three) times daily.   meloxicam 15 MG tablet Commonly known as:  MOBIC Take 15 mg by mouth daily.   metFORMIN 1000 MG tablet Commonly known as:  GLUCOPHAGE Take 1 tablet (1,000 mg total) by mouth 2 (two) times daily. What changed:  when to take this   mirtazapine 45 MG tablet Commonly known as:   REMERON Take 45 mg by mouth at bedtime as needed (for depression).   oxyCODONE-acetaminophen 5-325 MG tablet Commonly known as:  ROXICET Take 1 tablet by mouth every 4 (four) hours as needed.   PROAIR HFA 108 (90 Base) MCG/ACT inhaler Generic drug:  albuterol Inhale 2 puffs into the lungs 4 (four) times daily. For COPD   PROZAC 20 MG capsule Generic drug:  FLUoxetine Take 60 mg by mouth every morning.   QUEtiapine 50 MG tablet Commonly known as:  SEROQUEL Take 50 mg by mouth at bedtime.   sennosides-docusate sodium 8.6-50 MG tablet Commonly known as:  SENOKOT-S Take 2 tablets by mouth daily. What changed:  when to take this  reasons to take this   tiotropium 18 MCG inhalation capsule Commonly known as:  SPIRIVA Place 1 capsule (18 mcg total) into inhaler and inhale daily.   verapamil 40 MG tablet Commonly known as:  CALAN Take 40 mg by mouth 3 (three) times daily.      Follow-up Information    Flo Shanks, MD. Schedule an appointment as soon as possible for a visit in 2 weeks.   Specialty:  Otolaryngology Contact information: [address] [place] [address] [phone]  Allergies  Allergen Reactions  . Suboxone [Buprenorphine Hcl-Naloxone Hcl] Itching    Vaginal itching   . Ciprofloxacin Hives and Itching    Hives  . Penicillins Hives and Itching    Has patient had a PCN reaction causing immediate rash, facial/tongue/throat swelling, SOB or lightheadedness with hypotension: Yes Has patient had a PCN reaction causing severe rash involving mucus membranes or skin necrosis: No Has patient had a PCN reaction that required hospitalization: No Has patient had a PCN reaction occurring within the last 10 years: No If all of the above answers are "NO", then may proceed with Cephalosporin use.     Consultations:  ENT  Procedures Status post right-sided tonsillectomy  Radiological studies: Ct Soft Tissue Neck W  Contrast  Result Date: 06/09/2016 CLINICAL DATA:  Sore throat. History of diabetes, COPD, grave's disease. EXAM: CT NECK WITH CONTRAST TECHNIQUE: Multidetector CT imaging of the neck was performed using the standard protocol following the bolus administration of intravenous contrast. CONTRAST:  44mL ISOVUE-300 IOPAMIDOL (ISOVUE-300) INJECTION 61% COMPARISON:  Cervical spine radiographs April 14, 2015 FINDINGS: PHARYNX AND LARYNX: 2 x 1.2 x 3.3 cm (transverse by AP by CC) ill-defined cystic and solid mass RIGHT palatine tonsil extending to the base of tongue. No superimposed findings of acute tonsillitis. Mild RIGHT parapharyngeal fat stranding. Edema and mass effect extending to RIGHT hypopharynx with medial deviation RIGHT cricoarytenoid joint. However, symmetric piriform sinuses and normal larynx. SALIVARY GLANDS: Normal. THYROID: Trace residual thyroid tissue. LYMPH NODES: No lymphadenopathy by CT size criteria. However, 7 mm retropharyngeal lymph node. 8 mm short access RIGHT level IIa lymph node. VASCULAR: Normal. LIMITED INTRACRANIAL: Normal. VISUALIZED ORBITS: Nonacute. Old LEFT orbital floor blowout fracture. MASTOIDS AND VISUALIZED PARANASAL SINUSES: Well-aerated. SKELETON: Nonacute. Patient is edentulous. Mild degenerative change of the cervical spine. UPPER CHEST: Lung apices are clear. No superior mediastinal lymphadenopathy. OTHER: None. IMPRESSION: Cystic and solid 2 x 1.2 x 3.3 cm RIGHT palatine tonsil to base of tongue mass, this may be simple abscess though superinfected neoplasm or, necrotic neoplasm is suspected (including 7 mm RIGHT retropharyngeal lymph node). Patent airway. Recommend follow-up ENT consultation after treatment. Small amount of residual thyroid tissue. Acute findings discussed with and reconfirmed by PA KELLY GEKAS on 06/08/2016 at 11:56 pm. Electronically Signed   By: Awilda Metro M.D.   On: 06/09/2016 00:00     Subjective:  Discharge Exam: Vitals:   06/11/16  2005 06/12/16 0354 06/12/16 0613 06/12/16 1024  BP: (!) 147/83  134/71 (!) 144/79  Pulse: 89  91   Resp: 20     Temp: 98.5 F (36.9 C)  97.9 F (36.6 C)   TempSrc: Oral  Oral   SpO2: 98%  96%   Weight:  94.8 kg (208 lb 14.4 oz)    Height:       General: Pt is alert, awake, not in acute distress Cardiovascular: RRR, S1/S2 +, no rubs, no gallops Respiratory: CTA bilaterally, no wheezing, no rhonchi Abdominal: Soft, NT, ND, bowel sounds + Extremities: no edema, no cyanosis   The results of significant diagnostics from this hospitalization (including imaging, microbiology, ancillary and laboratory) are listed below for reference.    Microbiology: Recent Results (from the past 240 hour(s))  Rapid strep screen     Status: None   Collection Time: 06/06/16  9:35 PM  Result Value Ref Range Status   Streptococcus, Group A Screen (Direct) NEGATIVE NEGATIVE Final    Comment: (NOTE) A Rapid Antigen test may result negative if  the antigen level in the sample is below the detection level of this test. The FDA has not cleared this test as a stand-alone test therefore the rapid antigen negative result has reflexed to a Group A Strep culture.   Culture, group A strep     Status: None   Collection Time: 06/06/16  9:35 PM  Result Value Ref Range Status   Specimen Description THROAT  Final   Special Requests NONE Reflexed from [id number]  Final   Culture NO GROUP A STREP (S.PYOGENES) ISOLATED  Final   Report Status 06/09/2016 FINAL  Final  Respiratory Panel by PCR     Status: None   Collection Time: 06/09/16  1:29 AM  Result Value Ref Range Status   Adenovirus NOT DETECTED NOT DETECTED Final   Coronavirus 229E NOT DETECTED NOT DETECTED Final   Coronavirus HKU1 NOT DETECTED NOT DETECTED Final   Coronavirus NL63 NOT DETECTED NOT DETECTED Final   Coronavirus OC43 NOT DETECTED NOT DETECTED Final   Metapneumovirus NOT DETECTED NOT DETECTED Final   Rhinovirus / Enterovirus NOT DETECTED NOT DETECTED  Final   Influenza A NOT DETECTED NOT DETECTED Final   Influenza B NOT DETECTED NOT DETECTED Final   Parainfluenza Virus 1 NOT DETECTED NOT DETECTED Final   Parainfluenza Virus 2 NOT DETECTED NOT DETECTED Final   Parainfluenza Virus 3 NOT DETECTED NOT DETECTED Final   Parainfluenza Virus 4 NOT DETECTED NOT DETECTED Final   Respiratory Syncytial Virus NOT DETECTED NOT DETECTED Final   Bordetella pertussis NOT DETECTED NOT DETECTED Final   Chlamydophila pneumoniae NOT DETECTED NOT DETECTED Final   Mycoplasma pneumoniae NOT DETECTED NOT DETECTED Final  Surgical pcr screen     Status: None   Collection Time: 06/09/16  3:11 PM  Result Value Ref Range Status   MRSA, PCR NEGATIVE NEGATIVE Final   Staphylococcus aureus NEGATIVE NEGATIVE Final    Comment:        The Xpert SA Assay (FDA approved for NASAL specimens in patients over 19 years of age), is one component of a comprehensive surveillance program.  Test performance has been validated by [facility] for patients greater than or equal to 69 year old. It is not intended to diagnose infection nor to guide or monitor treatment.      Labs: BNP (last 3 results) No results for input(s): BNP in the last 8760 hours. Basic Metabolic Panel:  Recent Labs Lab 06/08/16 2157 06/09/16 0730 06/10/16 0531 06/11/16 0441  NA 138 135 134* 137  K 4.0 4.8 4.4 4.4  CL 100* 100* 96* 101  CO2 28 25 31 28   GLUCOSE 111* 246* 142* 108*  BUN 10 11 14 12   CREATININE 1.16* 0.95 0.92 0.86  CALCIUM 9.1 8.8* 8.7* 8.4*  MG  --  1.6*  --   --   PHOS  --  3.3  --   --    Liver Function Tests:  Recent Labs Lab 06/09/16 0730  AST 17  ALT 19  ALKPHOS 45  BILITOT 0.3  PROT 6.3*  ALBUMIN 3.7   No results for input(s): LIPASE, AMYLASE in the last 168 hours. No results for input(s): AMMONIA in the last 168 hours. CBC:  Recent Labs Lab 06/08/16 2157 06/09/16 0730 06/10/16 0531 06/11/16 0441  WBC 17.8* 18.6* 21.2* 12.0*  NEUTROABS 13.9*  --    --   --   HGB 11.2* 11.2* 10.9* 11.2*  HCT 34.2* 34.1* 33.8* 33.6*  MCV 88.4 87.9 88.7 88.2  PLT  198 210 237 193   Cardiac Enzymes:  Recent Labs Lab 06/09/16 0128 06/09/16 0730 06/09/16 1420  TROPONINI <0.03 <0.03 <0.03   BNP: Invalid input(s): POCBNP CBG:  Recent Labs Lab 06/11/16 1659 06/11/16 1956 06/12/16 0018 06/12/16 0351 06/12/16 0738  GLUCAP 103* 180* 108* 118* 137*   D-Dimer No results for input(s): DDIMER in the last 72 hours. Hgb A1c No results for input(s): HGBA1C in the last 72 hours. Lipid Profile No results for input(s): CHOL, HDL, LDLCALC, TRIG, CHOLHDL, LDLDIRECT in the last 72 hours. Thyroid function studies No results for input(s): TSH, T4TOTAL, T3FREE, THYROIDAB in the last 72 hours.  Invalid input(s): FREET3 Anemia work up No results for input(s): VITAMINB12, FOLATE, FERRITIN, TIBC, IRON, RETICCTPCT in the last 72 hours. Urinalysis    Component Value Date/Time   COLORURINE YELLOW 03/18/2016 0233   APPEARANCEUR HAZY (A) 03/18/2016 0233   LABSPEC 1.010 03/18/2016 0233   PHURINE 5.0 03/18/2016 0233   GLUCOSEU NEGATIVE 03/18/2016 0233   HGBUR NEGATIVE 03/18/2016 0233   BILIRUBINUR NEGATIVE 03/18/2016 0233   KETONESUR NEGATIVE 03/18/2016 0233   PROTEINUR NEGATIVE 03/18/2016 0233   UROBILINOGEN 0.2 03/12/2016 1308   NITRITE NEGATIVE 03/18/2016 0233   LEUKOCYTESUR NEGATIVE 03/18/2016 0233   Sepsis Labs Invalid input(s): PROCALCITONIN,  WBC,  LACTICIDVEN Microbiology Recent Results (from the past 240 hour(s))  Rapid strep screen     Status: None   Collection Time: 06/06/16  9:35 PM  Result Value Ref Range Status   Streptococcus, Group A Screen (Direct) NEGATIVE NEGATIVE Final    Comment: (NOTE) A Rapid Antigen test may result negative if the antigen level in the sample is below the detection level of this test. The FDA has not cleared this test as a stand-alone test therefore the rapid antigen negative result has reflexed to a Group A  Strep culture.   Culture, group A strep     Status: None   Collection Time: 06/06/16  9:35 PM  Result Value Ref Range Status   Specimen Description THROAT  Final   Special Requests NONE Reflexed from [id number]  Final   Culture NO GROUP A STREP (S.PYOGENES) ISOLATED  Final   Report Status 06/09/2016 FINAL  Final  Respiratory Panel by PCR     Status: None   Collection Time: 06/09/16  1:29 AM  Result Value Ref Range Status   Adenovirus NOT DETECTED NOT DETECTED Final   Coronavirus 229E NOT DETECTED NOT DETECTED Final   Coronavirus HKU1 NOT DETECTED NOT DETECTED Final   Coronavirus NL63 NOT DETECTED NOT DETECTED Final   Coronavirus OC43 NOT DETECTED NOT DETECTED Final   Metapneumovirus NOT DETECTED NOT DETECTED Final   Rhinovirus / Enterovirus NOT DETECTED NOT DETECTED Final   Influenza A NOT DETECTED NOT DETECTED Final   Influenza B NOT DETECTED NOT DETECTED Final   Parainfluenza Virus 1 NOT DETECTED NOT DETECTED Final   Parainfluenza Virus 2 NOT DETECTED NOT DETECTED Final   Parainfluenza Virus 3 NOT DETECTED NOT DETECTED Final   Parainfluenza Virus 4 NOT DETECTED NOT DETECTED Final   Respiratory Syncytial Virus NOT DETECTED NOT DETECTED Final   Bordetella pertussis NOT DETECTED NOT DETECTED Final   Chlamydophila pneumoniae NOT DETECTED NOT DETECTED Final   Mycoplasma pneumoniae NOT DETECTED NOT DETECTED Final  Surgical pcr screen     Status: None   Collection Time: 06/09/16  3:11 PM  Result Value Ref Range Status   MRSA, PCR NEGATIVE NEGATIVE Final   Staphylococcus aureus NEGATIVE NEGATIVE Final  Comment:        The Xpert SA Assay (FDA approved for NASAL specimens in patients over 68 years of age), is one component of a comprehensive surveillance program.  Test performance has been validated by [facility] for patients greater than or equal to 67 year old. It is not intended to diagnose infection nor to guide or monitor treatment.      Time coordinating discharge: Over  30 minutes  SIGNED:   Clint Lipps, MD  [facility] 06/12/2016, 10:44 AM Pager   If 7PM-7AM, please contact night-coverage [email or web address] Password [email or web address]

## 2016-08-13 ENCOUNTER — Emergency Department (HOSPITAL_COMMUNITY)
Admission: EM | Admit: 2016-08-13 | Discharge: 2016-08-14 | Disposition: A | Discharge status: Home / Self Care | Payer: Medicaid Other | Attending: Emergency Medicine | Admitting: Emergency Medicine

## 2016-08-13 ENCOUNTER — Encounter (HOSPITAL_COMMUNITY): Payer: Self-pay | Admitting: Emergency Medicine

## 2016-08-13 DIAGNOSIS — E1140 Type 2 diabetes mellitus with diabetic neuropathy, unspecified: Secondary | ICD-10-CM | POA: Insufficient documentation

## 2016-08-13 DIAGNOSIS — Z7984 Long term (current) use of oral hypoglycemic drugs: Secondary | ICD-10-CM | POA: Insufficient documentation

## 2016-08-13 DIAGNOSIS — Z79899 Other long term (current) drug therapy: Secondary | ICD-10-CM | POA: Insufficient documentation

## 2016-08-13 DIAGNOSIS — J029 Acute pharyngitis, unspecified: Secondary | ICD-10-CM | POA: Diagnosis present

## 2016-08-13 DIAGNOSIS — J449 Chronic obstructive pulmonary disease, unspecified: Secondary | ICD-10-CM | POA: Diagnosis not present

## 2016-08-13 DIAGNOSIS — I10 Essential (primary) hypertension: Secondary | ICD-10-CM | POA: Diagnosis not present

## 2016-08-13 DIAGNOSIS — Z139 Encounter for screening, unspecified: Secondary | ICD-10-CM

## 2016-08-13 DIAGNOSIS — Z9089 Acquired absence of other organs: Secondary | ICD-10-CM | POA: Diagnosis not present

## 2016-08-13 DIAGNOSIS — F17210 Nicotine dependence, cigarettes, uncomplicated: Secondary | ICD-10-CM | POA: Insufficient documentation

## 2016-08-13 DIAGNOSIS — E039 Hypothyroidism, unspecified: Secondary | ICD-10-CM | POA: Insufficient documentation

## 2016-08-13 LAB — BASIC METABOLIC PANEL
Anion gap: 8 (ref 5–15)
BUN: 15 mg/dL (ref 6–20)
CHLORIDE: 97 mmol/L — AB (ref 101–111)
CO2: 25 mmol/L (ref 22–32)
CREATININE: 1.15 mg/dL — AB (ref 0.44–1.00)
Calcium: 8.9 mg/dL (ref 8.9–10.3)
GFR calc non Af Amer: 51 mL/min — ABNORMAL LOW (ref >60)
GFR, EST AFRICAN AMERICAN: 59 mL/min — AB (ref >60)
Glucose, Bld: 108 mg/dL — ABNORMAL HIGH (ref 65–99)
POTASSIUM: 3.9 mmol/L (ref 3.5–5.1)
SODIUM: 130 mmol/L — AB (ref 135–145)

## 2016-08-13 LAB — CBC WITH DIFFERENTIAL/PLATELET
BASOS ABS: 0 10*3/uL (ref 0.0–0.1)
Basophils Relative: 0 %
EOS ABS: 0.1 10*3/uL (ref 0.0–0.7)
EOS PCT: 1 %
HCT: 35.2 % — ABNORMAL LOW (ref 36.0–46.0)
Hemoglobin: 11.5 g/dL — ABNORMAL LOW (ref 12.0–15.0)
LYMPHS ABS: 2.5 10*3/uL (ref 0.7–4.0)
Lymphocytes Relative: 17 %
MCH: 28.6 pg (ref 26.0–34.0)
MCHC: 32.7 g/dL (ref 30.0–36.0)
MCV: 87.6 fL (ref 78.0–100.0)
Monocytes Absolute: 1.1 10*3/uL — ABNORMAL HIGH (ref 0.1–1.0)
Monocytes Relative: 8 %
Neutro Abs: 11 10*3/uL — ABNORMAL HIGH (ref 1.7–7.7)
Neutrophils Relative %: 74 %
Platelets: 251 10*3/uL (ref 150–400)
RBC: 4.02 MIL/uL (ref 3.87–5.11)
RDW: 13.4 % (ref 11.5–15.5)
WBC: 14.7 10*3/uL — AB (ref 4.0–10.5)

## 2016-08-13 MED ORDER — IOPAMIDOL (ISOVUE-300) INJECTION 61%
INTRAVENOUS | Status: AC
  Administered 2016-08-14: 75 mL
  Filled 2016-08-13: qty 75

## 2016-08-13 NOTE — ED Triage Notes (Signed)
Pt presents to ED for assessment of right-sided throat pain x 1 month.  Pt with a recent hx of peritonsillar abscess to the left side, which prompted patient ot have left tonsil removed.  Pt states she is an every day smoker, and has been having sore throat since the surgery.  C/o waking up in the night to coughing and "feeling like my airway is blocked".

## 2016-08-13 NOTE — ED Provider Notes (Signed)
[facility]-EMERGENCY DEPT Provider Note   CSN: [id number] Arrival date & time: 08/13/16  1925     History   Chief Complaint Chief Complaint  Patient presents with  . Sore Throat    HPI Vanessa [name] is a 61 y.o. female.  Patient presents to the ED with a chief complaint of sensation that airway is becoming blocked at night. She reports a recent history of peritonsillar abscess and tonsillectomy performed by Dr. Cloria Spring in March of this year. Additionally she reports that she has sleep apnea, but she has been using her CPAP.  She states that over the past month or so her symptoms have been gradually worsening, and over the past 2 days she has been unable to get any sleep because of fear of not being able to breathe. She reports that she has to sleep sitting up. She denies any difficulty swallowing. She denies any fevers, chills, or throat or neck pain. She states that she was told by Dr. Lazarus Salines, that she may have difficulty swallowing following there surgery, but denies this.  She states that she has a burning sensation in her throat that is not relieved by Vicodin.  She denies any other associated symptoms.   The history is provided by the patient. No language interpreter was used.    Past Medical History:  Diagnosis Date  . Alcoholism (HCC)   . Allergic rhinitis   . Allergy   . Anxiety   . Arthritis    rheumatoid  . Asthma   . Back pain, chronic   . Chronic back pain 05/27/2012   Secondary to L4-5 herniated disc per patient  . Chronic headache   . COPD (chronic obstructive pulmonary disease) (HCC)   . Depression   . Diabetes mellitus   . Emphysema   . Emphysema of lung (HCC)   . Generalized anxiety disorder 05/26/2012  . GERD (gastroesophageal reflux disease)   . Grave's disease   . 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 Heart murmur   . Hernia, umbilical   . Herniated disc   . Herniated disc   .  Hyperlipidemia   . Hypertension   . Menopause   . Neuropathy   . OSA (obstructive sleep apnea)    has a cpap  . Osteoporosis   . 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 Rheumatoid arthritis (HCC)   . Shingles   . Sleep apnea    wear c-pap  . Tear of lateral meniscus of right knee 10/13/2013  . Wears dentures    top  . Wears glasses     Patient Active Problem List   Diagnosis Date Noted  . Pharyngeal mass 06/09/2016  . Alcohol abuse 06/09/2016  . Alcohol abuse, in remission 06/09/2016  . Atypical pneumonia 12/18/2014  . Essential hypertension 12/17/2014  . COPD exacerbation (HCC) 12/16/2014  . Tear of lateral meniscus of right knee 10/13/2013  . Chest pain 07/23/2013  . GERD (gastroesophageal reflux disease) 07/23/2013  . Morbid obesity (HCC) 07/23/2013  . Renal failure (ARF), acute on chronic (HCC) 07/23/2013  . HLD (hyperlipidemia) 07/23/2013  . Dysarthria 10/05/2012  . Diarrhea 05/27/2012  . Chronic back pain 05/27/2012  . Diabetes mellitus (HCC) 05/26/2012  . Hypothyroidism 05/26/2012  . 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 Depression 05/26/2012  . Generalized anxiety disorder 05/26/2012  . OSA (obstructive sleep apnea) 03/18/2011    Past Surgical History:  Procedure Laterality Date  . CHOLECYSTECTOMY    .  EYE SURGERY     eye back in socker-rt  . FOOT SURGERY     cyst rt foot  . INCISION AND DRAINAGE OF PERITONSILLAR ABCESS Right 06/09/2016   Procedure: INCISION AND DRAINAGE OF PERITONSILLAR ABCESS;  Surgeon: Flo Shanks, MD;  Location: [facility] OR;  Service: ENT;  Laterality: Right;  . KNEE ARTHROSCOPY WITH LATERAL MENISECTOMY Right 10/13/2013   Procedure: RIGHT KNEE ARTHROSCOPY WITH PARTIAL LATERAL MENISECTOMY/DEBRIDEMENT/SHAVING ;  Surgeon: Eulas Post, MD;  Location: [facility];  Service: Orthopedics;  Laterality: Right;  . LAPAROSCOPY     age 63  . LEFT HEART CATHETERIZATION WITH CORONARY ANGIOGRAM N/A 05/30/2012   Procedure: LEFT HEART  CATHETERIZATION WITH CORONARY ANGIOGRAM;  Surgeon: [name], MD;  Location: [facility] CATH LAB;  Service: Cardiovascular;  Laterality: N/A;  . TONSILLECTOMY AND ADENOIDECTOMY Right 06/09/2016   Procedure: RIGHT TONSILECTOMY;  Surgeon: Flo Shanks, MD;  Location: [facility] OR;  Service: ENT;  Laterality: Right;  . TUBAL LIGATION      OB History    Gravida Para Term Preterm AB Living   5 3     2 3    SAB TAB Ectopic Multiple Live Births   2               Home Medications    Prior to Admission medications   Medication Sig Start Date End Date Taking? Authorizing Provider  albuterol (PROAIR HFA) 108 (90 BASE) MCG/ACT inhaler Inhale 2 puffs into the lungs 4 (four) times daily. For COPD    [provider]  amoxicillin-clavulanate (AUGMENTIN) 875-125 MG tablet Take 1 tablet by mouth 2 (two) times daily. 06/12/16   Clydia Llano, MD  atorvastatin (LIPITOR) 40 MG tablet Take 1 tablet (40 mg total) by mouth daily. 10/06/12   Gust Rung, DO  busPIRone (BUSPAR) 10 MG tablet Take 20 mg by mouth 2 (two) times daily.     [provider]  cycloSPORINE (RESTASIS) 0.05 % ophthalmic emulsion Place 1 drop into both eyes 2 (two) times daily.    [provider]  FLUoxetine (PROZAC) 20 MG capsule Take 60 mg by mouth every morning.     [provider]  gabapentin (NEURONTIN) 300 MG capsule Take 300 mg by mouth 3 (three) times daily.    [provider]  glipiZIDE (GLUCOTROL) 10 MG tablet Take 10 mg by mouth daily before breakfast.     [provider]  HYDROcodone-acetaminophen (NORCO) 10-325 MG tablet Take 0.5-1 tablets by mouth every 6 (six) hours as needed for severe pain.     [provider]  ibuprofen (ADVIL,MOTRIN) 200 MG tablet Take 400 mg by mouth every 6 (six) hours as needed (for pain).    [provider]  ipratropium-albuterol (DUONEB) 0.5-2.5 (3) MG/3ML SOLN Take 3 mLs by nebulization every 6 (six) hours as needed (for wheezing or  shortness of breath).     [provider]  levothyroxine (SYNTHROID, LEVOTHROID) 175 MCG tablet Take 1 tablet (175 mcg total) by mouth every morning. 03/10/16   Audry Pili, PA-C  lidocaine (XYLOCAINE) 2 % solution Use as directed 15 mLs in the mouth or throat as needed for mouth pain. 06/07/16   Ward, Chase Picket, PA-C  lisinopril (PRINIVIL,ZESTRIL) 10 MG tablet Take 1 tablet (10 mg total) by mouth daily. 03/10/16   Audry Pili, PA-C  loratadine (CLARITIN) 10 MG tablet Take 10 mg by mouth daily.     [provider]  magic mouthwash w/lidocaine SOLN Take 5  mLs by mouth 3 (three) times daily. 06/12/16   Clydia Llano, MD  meloxicam (MOBIC) 15 MG tablet Take 15 mg by mouth daily.    [provider]  metFORMIN (GLUCOPHAGE) 1000 MG tablet Take 1 tablet (1,000 mg total) by mouth 2 (two) times daily. Patient taking differently: Take 1,000 mg by mouth daily with breakfast.  03/10/16   Audry Pili, PA-C  mirtazapine (REMERON) 45 MG tablet Take 45 mg by mouth at bedtime as needed (for depression).     [provider]  oxyCODONE-acetaminophen (ROXICET) 5-325 MG tablet Take 1 tablet by mouth every 4 (four) hours as needed. 06/12/16   Clydia Llano, MD  QUEtiapine (SEROQUEL) 50 MG tablet Take 50 mg by mouth at bedtime.    [provider]  sennosides-docusate sodium (SENOKOT-S) 8.6-50 MG tablet Take 2 tablets by mouth daily. Patient taking differently: Take 2 tablets by mouth daily as needed for constipation.  10/13/13   Teryl Lucy, MD  tiotropium (SPIRIVA) 18 MCG inhalation capsule Place 1 capsule (18 mcg total) into inhaler and inhale daily. 12/18/14   Valentino Nose, MD  verapamil (CALAN) 40 MG tablet Take 40 mg by mouth 3 (three) times daily.    [provider]    Family History Family History  Problem Relation Age of Onset  . Emphysema Mother   . Allergies Mother   . Asthma Mother   . Heart disease Mother   . Rheum arthritis Mother   .  Diabetes Mother   . Kidney disease Mother   . Allergies Daughter   . Asthma Brother   . Breast cancer Other        aunt  . Lung cancer Brother   . Throat cancer Brother   . Colon cancer Neg Hx   . Colon polyps Neg Hx   . Esophageal cancer Neg Hx   . Rectal cancer Neg Hx   . Stomach cancer Neg Hx     Social History Social History  Substance Use Topics  . Smoking status: Current Every Day Smoker    Packs/day: 1.00    Years: 40.00    Types: Cigarettes  . Smokeless tobacco: Never Used  . Alcohol use No     Allergies   Suboxone [buprenorphine hcl-naloxone hcl]; Ciprofloxacin; and Penicillins   Review of Systems Review of Systems  All other systems reviewed and are negative.    Physical Exam Updated Vital Signs BP 124/68 (BP Location: Right Arm)   Pulse 91   Temp 98.8 F (37.1 C) (Oral)   Resp 18   SpO2 94%   Physical Exam  Constitutional: She is oriented to person, place, and time. She appears well-developed and well-nourished.  HENT:  Head: Normocephalic and atraumatic.  No visible abscess or swelling, no visible mass, no stridor, normal phonation Mallampati 4  Eyes: Conjunctivae and EOM are normal. Pupils are equal, round, and reactive to light.  Neck: Normal range of motion. Neck supple.  Cardiovascular: Normal rate and regular rhythm.  Exam reveals no gallop and no friction rub.   No murmur heard. Pulmonary/Chest: Effort normal and breath sounds normal. No respiratory distress. She has no wheezes. She has no rales. She exhibits no tenderness.  Abdominal: Soft. Bowel sounds are normal. She exhibits no distension and no mass. There is no tenderness. There is no rebound and no guarding.  Musculoskeletal: Normal range of motion. She exhibits no edema or tenderness.  Neurological: She is alert and oriented to person, place, and time.  Skin:  Skin is warm and dry.  Psychiatric: She has a normal mood and affect. Her behavior is normal. Judgment and thought content  normal.  Nursing note and vitals reviewed.    ED Treatments / Results  Labs (all labs ordered are listed, but only abnormal results are displayed) Labs Reviewed - No data to display  EKG  EKG Interpretation None       Radiology No results found.  Procedures Procedures (including critical care time)  Medications Ordered in ED Medications - No data to display   Initial Impression / Assessment and Plan / ED Course  I have reviewed the triage vital signs and the nursing notes.  Pertinent labs & imaging results that were available during my care of the patient were reviewed by me and considered in my medical decision making (see chart for details).     Patient with recent tonsillectomy secondary to peritonsillar abscess by Dr. Lazarus Salines.  Patient states that over the past month or so she has had a sensation like she can't breathe at night. She wanted to be evaluated to see if there is any worsening infection. Patient seen by and discussed with Dr. Hyacinth Meeker, who agrees that the visualized portion of the oropharynx is normal, and agrees with plan to order CT to rule out any worsening changes. CT scan is reassuring. When I went to reassess the patient she was asleep lying on her side and had normal O2 sat on monitor.  I feel that she is stable for outpatient follow-up with Dr. Lazarus Salines.  Discussed the plan with the patient, who understands and agrees.  Final Clinical Impressions(s) / ED Diagnoses   Final diagnoses:  Status post tonsillectomy  Encounter for medical screening examination    New Prescriptions New Prescriptions   No medications on file     Felipa Furnace 08/14/16 0120    Eber Hong, MD 08/19/16 1754

## 2016-08-13 NOTE — ED Provider Notes (Signed)
Pt with sore throat and has had prior PTA I and D done last month Having ongoing pain / swelling No sleep the last 2 nights b/c of throat swelling / closing No trouble swallowing / breathing when awake.  On exam has clear OP - Mallampati 4, but phoneation normal - pt laying on her side flat and sleeping when I walk in the room without distress or difficulty breathing.  Stable appaering Repeat CT neck Anticipate d/c if neg.  Medical screening examination/treatment/procedure(s) were conducted as a shared visit with non-physician practitioner(s) and myself.  I personally evaluated the patient during the encounter.  Clinical Impression:   Final diagnoses:  Status post tonsillectomy  Encounter for medical screening examination           Eber Hong, MD 08/19/16 1754

## 2016-08-13 NOTE — ED Notes (Signed)
Her friend debra called to check on you, [phone].

## 2016-08-14 ENCOUNTER — Emergency Department (HOSPITAL_COMMUNITY): Payer: Medicaid Other

## 2016-08-14 ENCOUNTER — Encounter (HOSPITAL_COMMUNITY): Payer: Self-pay

## 2016-08-14 NOTE — ED Notes (Signed)
Patient transported to CT 

## 2016-08-14 NOTE — Discharge Instructions (Signed)
Please follow-up with Dr. Lazarus Salines.  You may return to the ER if your symptoms worsen.

## 2016-08-21 ENCOUNTER — Other Ambulatory Visit: Payer: Self-pay | Admitting: Otolaryngology

## 2016-08-21 DIAGNOSIS — J385 Laryngeal spasm: Secondary | ICD-10-CM

## 2016-08-21 DIAGNOSIS — R1314 Dysphagia, pharyngoesophageal phase: Secondary | ICD-10-CM

## 2016-08-21 DIAGNOSIS — K219 Gastro-esophageal reflux disease without esophagitis: Secondary | ICD-10-CM

## 2016-09-08 ENCOUNTER — Encounter (HOSPITAL_COMMUNITY): Payer: Self-pay

## 2016-09-08 ENCOUNTER — Emergency Department (HOSPITAL_COMMUNITY)
Admission: EM | Admit: 2016-09-08 | Discharge: 2016-09-08 | Disposition: A | Discharge status: Home / Self Care | Payer: Medicaid Other | Attending: Emergency Medicine | Admitting: Emergency Medicine

## 2016-09-08 ENCOUNTER — Emergency Department (HOSPITAL_COMMUNITY): Payer: Medicaid Other

## 2016-09-08 DIAGNOSIS — E039 Hypothyroidism, unspecified: Secondary | ICD-10-CM | POA: Diagnosis not present

## 2016-09-08 DIAGNOSIS — Z7984 Long term (current) use of oral hypoglycemic drugs: Secondary | ICD-10-CM | POA: Diagnosis not present

## 2016-09-08 DIAGNOSIS — I10 Essential (primary) hypertension: Secondary | ICD-10-CM | POA: Diagnosis not present

## 2016-09-08 DIAGNOSIS — E1140 Type 2 diabetes mellitus with diabetic neuropathy, unspecified: Secondary | ICD-10-CM | POA: Insufficient documentation

## 2016-09-08 DIAGNOSIS — F17210 Nicotine dependence, cigarettes, uncomplicated: Secondary | ICD-10-CM | POA: Diagnosis not present

## 2016-09-08 DIAGNOSIS — J449 Chronic obstructive pulmonary disease, unspecified: Secondary | ICD-10-CM | POA: Diagnosis not present

## 2016-09-08 DIAGNOSIS — Z79899 Other long term (current) drug therapy: Secondary | ICD-10-CM | POA: Insufficient documentation

## 2016-09-08 DIAGNOSIS — R220 Localized swelling, mass and lump, head: Secondary | ICD-10-CM | POA: Insufficient documentation

## 2016-09-08 LAB — BASIC METABOLIC PANEL
ANION GAP: 7 (ref 5–15)
BUN: 8 mg/dL (ref 6–20)
CO2: 29 mmol/L (ref 22–32)
Calcium: 9.3 mg/dL (ref 8.9–10.3)
Chloride: 97 mmol/L — ABNORMAL LOW (ref 101–111)
Creatinine, Ser: 1.08 mg/dL — ABNORMAL HIGH (ref 0.44–1.00)
GFR calc Af Amer: >60 mL/min (ref >60)
GFR calc non Af Amer: 55 mL/min — ABNORMAL LOW (ref >60)
GLUCOSE: 84 mg/dL (ref 65–99)
POTASSIUM: 3.8 mmol/L (ref 3.5–5.1)
Sodium: 133 mmol/L — ABNORMAL LOW (ref 135–145)

## 2016-09-08 LAB — CBC WITH DIFFERENTIAL/PLATELET
BASOS ABS: 0 10*3/uL (ref 0.0–0.1)
Basophils Relative: 0 %
EOS PCT: 2 %
Eosinophils Absolute: 0.2 10*3/uL (ref 0.0–0.7)
HEMATOCRIT: 37.1 % (ref 36.0–46.0)
Hemoglobin: 12.3 g/dL (ref 12.0–15.0)
LYMPHS ABS: 2.1 10*3/uL (ref 0.7–4.0)
LYMPHS PCT: 20 %
MCH: 28.9 pg (ref 26.0–34.0)
MCHC: 33.2 g/dL (ref 30.0–36.0)
MCV: 87.1 fL (ref 78.0–100.0)
MONO ABS: 0.6 10*3/uL (ref 0.1–1.0)
Monocytes Relative: 6 %
NEUTROS ABS: 7.7 10*3/uL (ref 1.7–7.7)
Neutrophils Relative %: 72 %
Platelets: 215 10*3/uL (ref 150–400)
RBC: 4.26 MIL/uL (ref 3.87–5.11)
RDW: 13.5 % (ref 11.5–15.5)
WBC: 10.6 10*3/uL — ABNORMAL HIGH (ref 4.0–10.5)

## 2016-09-08 MED ORDER — PREDNISONE 50 MG PO TABS: 50.0000 mg | ORAL_TABLET | Freq: Every day | ORAL | 0 refills | Status: DC

## 2016-09-08 MED ORDER — CYCLOBENZAPRINE HCL 10 MG PO TABS: 10.0000 mg | ORAL_TABLET | Freq: Two times a day (BID) | ORAL | 0 refills | Status: DC | PRN

## 2016-09-08 MED ORDER — HYDROCODONE-ACETAMINOPHEN 5-325 MG PO TABS
1.0000 | ORAL_TABLET | ORAL | Status: AC
  Administered 2016-09-08: 1 via ORAL
  Filled 2016-09-08: qty 1

## 2016-09-08 NOTE — Discharge Instructions (Signed)
Follow-up with your primary care doctor and your ENT doctor, take the steroids to help with the swelling, continue antihistamines.

## 2016-09-08 NOTE — ED Notes (Signed)
Patient transported to X-ray 

## 2016-09-08 NOTE — ED Provider Notes (Signed)
[facility]-EMERGENCY DEPT Provider Note   CSN: [id number] Arrival date & time: 09/08/16  1451     History   Chief Complaint Chief Complaint  Patient presents with  . Facial Swelling    HPI Vanessa [name] is a 61 y.o. female.  HPI Pt has not been feeling well for the last month.  She will wake up at night felling like she gets swelling in her face at night.  She wakes up feeling like she has trouble swallowing and there is something blocking her airway.  Her throat feels dry.   She was seen in the ED on 5/18.   She felt the symptoms again last night.  She has aches in her head, shoulders, neck and head.   SHe has sleep apnea and wears CPAP.  She had tonsillectomy surgery one month ago.  She saw her surgeon on 5/23.  She has plans to see her PCP on Monday.  Past Medical History:  Diagnosis Date  . Alcoholism (HCC)   . Allergic rhinitis   . Allergy   . Anxiety   . Arthritis    rheumatoid  . Asthma   . Back pain, chronic   . Chronic back pain 05/27/2012   Secondary to L4-5 herniated disc per patient  . Chronic headache   . COPD (chronic obstructive pulmonary disease) (HCC)   . Depression   . Diabetes mellitus   . Emphysema   . Emphysema of lung (HCC)   . Generalized anxiety disorder 05/26/2012  . GERD (gastroesophageal reflux disease)   . Grave's disease   . 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 Heart murmur   . Hernia, umbilical   . Herniated disc   . Herniated disc   . Hyperlipidemia   . Hypertension   . Menopause   . Neuropathy   . OSA (obstructive sleep apnea)    has a cpap  . Osteoporosis   . 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 Rheumatoid arthritis (HCC)   . Shingles   . Sleep apnea    wear c-pap  . Tear of lateral meniscus of right knee 10/13/2013  . Wears dentures    top  . Wears glasses     Patient Active Problem List   Diagnosis Date Noted  . Pharyngeal mass 06/09/2016  . Alcohol abuse  06/09/2016  . Alcohol abuse, in remission 06/09/2016  . Atypical pneumonia 12/18/2014  . Essential hypertension 12/17/2014  . COPD exacerbation (HCC) 12/16/2014  . Tear of lateral meniscus of right knee 10/13/2013  . Chest pain 07/23/2013  . GERD (gastroesophageal reflux disease) 07/23/2013  . Morbid obesity (HCC) 07/23/2013  . Renal failure (ARF), acute on chronic (HCC) 07/23/2013  . HLD (hyperlipidemia) 07/23/2013  . Dysarthria 10/05/2012  . Diarrhea 05/27/2012  . Chronic back pain 05/27/2012  . Diabetes mellitus (HCC) 05/26/2012  . Hypothyroidism 05/26/2012  . 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 Depression 05/26/2012  . Generalized anxiety disorder 05/26/2012  . OSA (obstructive sleep apnea) 03/18/2011    Past Surgical History:  Procedure Laterality Date  . CHOLECYSTECTOMY    . EYE SURGERY     eye back in socker-rt  . FOOT SURGERY     cyst rt foot  . INCISION AND DRAINAGE OF PERITONSILLAR ABCESS Right 06/09/2016   Procedure: INCISION AND DRAINAGE OF PERITONSILLAR ABCESS;  Surgeon: Flo Shanks, MD;  Location: [facility] OR;  Service: ENT;  Laterality: Right;  . KNEE ARTHROSCOPY WITH LATERAL  MENISECTOMY Right 10/13/2013   Procedure: RIGHT KNEE ARTHROSCOPY WITH PARTIAL LATERAL MENISECTOMY/DEBRIDEMENT/SHAVING ;  Surgeon: Eulas Post, MD;  Location: [facility];  Service: Orthopedics;  Laterality: Right;  . LAPAROSCOPY     age 46  . LEFT HEART CATHETERIZATION WITH CORONARY ANGIOGRAM N/A 05/30/2012   Procedure: LEFT HEART CATHETERIZATION WITH CORONARY ANGIOGRAM;  Surgeon: [name], MD;  Location: [facility] CATH LAB;  Service: Cardiovascular;  Laterality: N/A;  . TONSILLECTOMY    . TONSILLECTOMY AND ADENOIDECTOMY Right 06/09/2016   Procedure: RIGHT TONSILECTOMY;  Surgeon: Flo Shanks, MD;  Location: [facility] OR;  Service: ENT;  Laterality: Right;  . TUBAL LIGATION      OB History    Gravida Para Term Preterm AB Living   5 3     2 3    SAB TAB Ectopic Multiple  Live Births   2               Home Medications    Prior to Admission medications   Medication Sig Start Date End Date Taking? Authorizing Provider  albuterol (PROAIR HFA) 108 (90 BASE) MCG/ACT inhaler Inhale 2 puffs into the lungs 4 (four) times daily. For COPD   Yes [provider]  amoxicillin-clavulanate (AUGMENTIN) 875-125 MG tablet Take 1 tablet by mouth 2 (two) times daily. Patient not taking: Reported on 09/08/2016 06/12/16   06/14/16, MD  atorvastatin (LIPITOR) 40 MG tablet Take 1 tablet (40 mg total) by mouth daily. 10/06/12   12/07/12, DO  busPIRone (BUSPAR) 10 MG tablet Take 20 mg by mouth 2 (two) times daily.     [provider]  cyclobenzaprine (FLEXERIL) 10 MG tablet Take 1 tablet (10 mg total) by mouth 2 (two) times daily as needed for muscle spasms. 09/08/16   11/08/16, MD  cycloSPORINE (RESTASIS) 0.05 % ophthalmic emulsion Place 1 drop into both eyes 2 (two) times daily.    [provider]  FLUoxetine (PROZAC) 20 MG capsule Take 60 mg by mouth every morning.     [provider]  gabapentin (NEURONTIN) 300 MG capsule Take 300 mg by mouth 3 (three) times daily.    [provider]  glipiZIDE (GLUCOTROL) 10 MG tablet Take 10 mg by mouth daily before breakfast.     [provider]  HYDROcodone-acetaminophen (NORCO) 10-325 MG tablet Take 0.5-1 tablets by mouth every 6 (six) hours as needed for severe pain.     [provider]  ibuprofen (ADVIL,MOTRIN) 200 MG tablet Take 400 mg by mouth every 6 (six) hours as needed (for pain).    [provider]  ipratropium-albuterol (DUONEB) 0.5-2.5 (3) MG/3ML SOLN Take 3 mLs by nebulization every 6 (six) hours as needed (for wheezing or shortness of breath).     [provider]  levothyroxine (SYNTHROID, LEVOTHROID) 175 MCG tablet Take 1 tablet (175 mcg total) by mouth every morning. 03/10/16   14/12/17, PA-C  lidocaine (XYLOCAINE) 2 % solution Use as  directed 15 mLs in the mouth or throat as needed for mouth pain. 06/07/16   Ward, 08/07/16, PA-C  lisinopril (PRINIVIL,ZESTRIL) 10 MG tablet Take 1 tablet (10 mg total) by mouth daily. 03/10/16   14/12/17, PA-C  loratadine (CLARITIN) 10 MG tablet Take 10 mg by mouth daily.     [provider]  magic mouthwash w/lidocaine SOLN Take 5 mLs by mouth 3 (three) times daily. 06/12/16   06/14/16, MD  meloxicam (MOBIC) 15 MG tablet Take 15  mg by mouth daily.    [provider]  metFORMIN (GLUCOPHAGE) 1000 MG tablet Take 1 tablet (1,000 mg total) by mouth 2 (two) times daily. Patient taking differently: Take 1,000 mg by mouth daily with breakfast.  03/10/16   Audry Pili, PA-C  mirtazapine (REMERON) 45 MG tablet Take 45 mg by mouth at bedtime as needed (for depression).     [provider]  oxyCODONE-acetaminophen (ROXICET) 5-325 MG tablet Take 1 tablet by mouth every 4 (four) hours as needed. 06/12/16   Clydia Llano, MD  predniSONE (DELTASONE) 50 MG tablet Take 1 tablet (50 mg total) by mouth daily. 09/08/16   Linwood Dibbles, MD  QUEtiapine (SEROQUEL) 50 MG tablet Take 50 mg by mouth at bedtime.    [provider]  sennosides-docusate sodium (SENOKOT-S) 8.6-50 MG tablet Take 2 tablets by mouth daily. Patient taking differently: Take 2 tablets by mouth daily as needed for constipation.  10/13/13   Teryl Lucy, MD  tiotropium (SPIRIVA) 18 MCG inhalation capsule Place 1 capsule (18 mcg total) into inhaler and inhale daily. 12/18/14   Valentino Nose, MD  verapamil (CALAN) 40 MG tablet Take 40 mg by mouth 3 (three) times daily.    [provider]    Family History Family History  Problem Relation Age of Onset  . Emphysema Mother   . Allergies Mother   . Asthma Mother   . Heart disease Mother   . Rheum arthritis Mother   . Diabetes Mother   . Kidney disease Mother   . Allergies Daughter   . Asthma Brother   . Breast cancer Other        aunt  . Lung  cancer Brother   . Throat cancer Brother   . Colon cancer Neg Hx   . Colon polyps Neg Hx   . Esophageal cancer Neg Hx   . Rectal cancer Neg Hx   . Stomach cancer Neg Hx     Social History Social History  Substance Use Topics  . Smoking status: Current Every Day Smoker    Packs/day: 1.00    Years: 40.00    Types: Cigarettes  . Smokeless tobacco: Never Used  . Alcohol use No     Allergies   Suboxone [buprenorphine hcl-naloxone hcl]; Ciprofloxacin; and Penicillins   Review of Systems Review of Systems  All other systems reviewed and are negative.    Physical Exam Updated Vital Signs BP 140/84 (BP Location: Right Arm)   Pulse 66   Temp 99.5 F (37.5 C) (Oral)   Resp 16   Ht 1.753 m (5\' 9" )   Wt 93.4 kg (206 lb)   SpO2 99%   BMI 30.42 kg/m   Physical Exam  Constitutional: She appears well-developed and well-nourished. No distress.  HENT:  Head: Normocephalic and atraumatic.  Right Ear: External ear normal.  Left Ear: External ear normal.  Mouth/Throat: No oropharyngeal exudate.  No angioedema  Eyes: Conjunctivae are normal. Right eye exhibits no discharge. Left eye exhibits no discharge. No scleral icterus.  Neck: Neck supple. No tracheal deviation present.  Cardiovascular: Normal rate, regular rhythm and intact distal pulses.   Pulmonary/Chest: Effort normal and breath sounds normal. No stridor. No respiratory distress. She has no wheezes. She has no rales.  Abdominal: Soft. Bowel sounds are normal. She exhibits no distension. There is no tenderness. There is no rebound and no guarding.  Musculoskeletal: She exhibits no edema or tenderness.  Neurological: She is alert. She has normal strength. No cranial  nerve deficit (no facial droop, extraocular movements intact, no slurred speech) or sensory deficit. She exhibits normal muscle tone. She displays no seizure activity. Coordination normal.  Skin: Skin is warm and dry. No rash noted.  Psychiatric: She has a  normal mood and affect.  Nursing note and vitals reviewed.    ED Treatments / Results  Labs (all labs ordered are listed, but only abnormal results are displayed) Labs Reviewed  CBC WITH DIFFERENTIAL/PLATELET - Abnormal; Notable for the following:       Result Value   WBC 10.6 (*)    All other components within normal limits  BASIC METABOLIC PANEL - Abnormal; Notable for the following:    Sodium 133 (*)    Chloride 97 (*)    Creatinine, Ser 1.08 (*)    GFR calc non Af Amer 55 (*)    All other components within normal limits    EKG  EKG Interpretation  Date/Time:  Tuesday September 08 2016 17:17:52 EDT Ventricular Rate:  66 PR Interval:    QRS Duration: 100 QT Interval:  431 QTC Calculation: 452 R Axis:   -6 Text Interpretation:  Sinus rhythm Anteroseptal infarct, old Since last tracing rate slower Confirmed by Linwood Dibbles [phone]) on 09/08/2016 5:20:39 PM       Radiology Dg Chest 2 View  Result Date: 09/08/2016 CLINICAL DATA:  Shortness of breath over the last month. Facial swelling and neck pain today. EXAM: CHEST  2 VIEW COMPARISON:  05/03/2016 FINDINGS: Heart size is normal. Mediastinal shadows are normal. The lungs are clear. No bronchial thickening. No infiltrate, mass, effusion or collapse. Pulmonary vascularity is normal. No bony abnormality. IMPRESSION: Normal chest Electronically Signed   By: Paulina Fusi M.D.   On: 09/08/2016 17:53    Procedures Procedures (including critical care time)  Medications Ordered in ED Medications  HYDROcodone-acetaminophen (NORCO/VICODIN) 5-325 MG per tablet 1 tablet (not administered)     Initial Impression / Assessment and Plan / ED Course  I have reviewed the triage vital signs and the nursing notes.  Pertinent labs & imaging results that were available during my care of the patient were reviewed by me and considered in my medical decision making (see chart for details).  Clinical Course as of Sep 08 1856  Tue Sep 08, 2016    1647 Pt saw her ENT doctor on 5/23 according to records.  No acute findings noted on that.  [JK]  1853 Pt requests a pain pill for her chronic neck and back pain.  Will give a  dose of vicodin  [JK]    Clinical Course User Index [JK] Linwood Dibbles, MD    Patient presented to the emergency room with several complaints. Her primary concern was difficulty breathing at night. X-ray does not show any evidence of pneumonia or pulmonary edema. Laboratory tests are reassuring. Patient did have a CT scan performed last month showed improvement of the residual findings associated with her tonsillectomy. She has no evidence of any stridor or airway difficulty here. I do not appreciate any facial swelling. Sure if she's having some allergic type symptoms. It may be multifactorial. I will have her try a course of steroids C if it helps with possible allergic symptoms.  Patient states she is also having trouble with chronic back pain and myalgias. She does have chronic pain issues. She was given a Vicodin for pain. I will have her try some additional muscle relaxants. She can follow-up with her primary doctor  Final Clinical  Impressions(s) / ED Diagnoses   Final diagnoses:  Facial swelling    New Prescriptions New Prescriptions   CYCLOBENZAPRINE (FLEXERIL) 10 MG TABLET    Take 1 tablet (10 mg total) by mouth 2 (two) times daily as needed for muscle spasms.   PREDNISONE (DELTASONE) 50 MG TABLET    Take 1 tablet (50 mg total) by mouth daily.     Linwood Dibbles, MD 09/08/16 [phone]

## 2016-09-08 NOTE — ED Triage Notes (Addendum)
Per Pt, Pt reports significant allergies for the past month. Pt reports some facial swelling and swelling noted to her upper back for the past month. She has been using OTC medications to help. Pt reports itching throat, eyes watering, and congestion. Pt had a recent thyroidectomy in March with abscess noted. Pt continues to have dry mouth and back pain.   Pt also reports concerns of sleep apnea.

## 2016-09-11 ENCOUNTER — Ambulatory Visit
Admission: RE | Admit: 2016-09-11 | Discharge: 2016-09-11 | Disposition: A | Discharge status: Home / Self Care | Payer: Medicaid Other | Source: Ambulatory Visit | Attending: Otolaryngology | Admitting: Otolaryngology

## 2016-09-11 DIAGNOSIS — K219 Gastro-esophageal reflux disease without esophagitis: Secondary | ICD-10-CM

## 2016-09-11 DIAGNOSIS — R1314 Dysphagia, pharyngoesophageal phase: Secondary | ICD-10-CM

## 2016-09-11 DIAGNOSIS — J385 Laryngeal spasm: Secondary | ICD-10-CM

## 2016-10-24 ENCOUNTER — Encounter (HOSPITAL_COMMUNITY): Payer: Self-pay | Admitting: Emergency Medicine

## 2016-10-24 DIAGNOSIS — Z5321 Procedure and treatment not carried out due to patient leaving prior to being seen by health care provider: Secondary | ICD-10-CM | POA: Insufficient documentation

## 2016-10-24 DIAGNOSIS — R21 Rash and other nonspecific skin eruption: Secondary | ICD-10-CM | POA: Diagnosis present

## 2016-10-24 NOTE — ED Triage Notes (Signed)
Pt c/o multiple laceration around her body and on el vulva, pt states she is in constant itchiness pt thinks that she thinks is part of her shingles or it can be scabies wants to be evaluated. No fever or chills.

## 2016-10-24 NOTE — ED Notes (Signed)
Pt up to desk stating that she will follow up with primary care physician.

## 2016-10-25 ENCOUNTER — Emergency Department (HOSPITAL_COMMUNITY)
Admission: EM | Admit: 2016-10-25 | Discharge: 2016-10-25 | Disposition: A | Discharge status: Home / Self Care | Payer: Medicaid Other | Attending: Emergency Medicine | Admitting: Emergency Medicine

## 2016-10-26 ENCOUNTER — Emergency Department (HOSPITAL_COMMUNITY)
Admission: EM | Admit: 2016-10-26 | Discharge: 2016-10-26 | Disposition: A | Discharge status: Home / Self Care | Payer: Medicaid Other | Attending: Emergency Medicine | Admitting: Emergency Medicine

## 2016-10-26 ENCOUNTER — Encounter (HOSPITAL_COMMUNITY): Payer: Self-pay | Admitting: Emergency Medicine

## 2016-10-26 DIAGNOSIS — N898 Other specified noninflammatory disorders of vagina: Secondary | ICD-10-CM | POA: Insufficient documentation

## 2016-10-26 DIAGNOSIS — Z5321 Procedure and treatment not carried out due to patient leaving prior to being seen by health care provider: Secondary | ICD-10-CM | POA: Insufficient documentation

## 2016-10-26 NOTE — ED Notes (Signed)
Bed: WA11 Expected date:  Expected time:  Means of arrival:  Comments: Ems 

## 2016-10-26 NOTE — ED Triage Notes (Addendum)
Pt BIB EMS for multiple complaints. Patient originally called out for shaking, upon arrival to the scene patient is not shaking. Pt stating she is having a herpes flare up with odorous vaginal discharge and open lesions on her labia. Patient given acyclovir for herpes infection, during flare ups, taking x2 the normal dose per PCP. Patient's other complaints include a headache, dry mouth, and her toes hurt. Hx of DM. Patient's mucous membranes are pink and moist. Patient went to [facility] for this x2 days ago, LWBS.

## 2016-10-26 NOTE — ED Notes (Signed)
Pt stated to staff that she wanted to leave. This Clinical research associate was upstairs with another patient when patient left. Upon arrival back downstairs patient had vacated the room.

## 2016-10-26 NOTE — ED Notes (Addendum)
Patient stated to this nurse that she, "Just realized I have not taken my prozac in 3 weeks... I think I'm having an anxiety attack."

## 2016-11-12 ENCOUNTER — Ambulatory Visit (INDEPENDENT_AMBULATORY_CARE_PROVIDER_SITE_OTHER): Payer: Medicaid Other | Admitting: Acute Care

## 2016-11-12 ENCOUNTER — Ambulatory Visit (INDEPENDENT_AMBULATORY_CARE_PROVIDER_SITE_OTHER)
Admission: RE | Admit: 2016-11-12 | Discharge: 2016-11-12 | Disposition: A | Discharge status: Home / Self Care | Payer: Medicaid Other | Source: Ambulatory Visit | Attending: Acute Care | Admitting: Acute Care

## 2016-11-12 ENCOUNTER — Encounter: Payer: Self-pay | Admitting: Acute Care

## 2016-11-12 VITALS — BP 122/68 | HR 62 | Ht 69.0 in | Wt 218.0 lb

## 2016-11-12 DIAGNOSIS — R05 Cough: Secondary | ICD-10-CM

## 2016-11-12 DIAGNOSIS — G4719 Other hypersomnia: Secondary | ICD-10-CM | POA: Diagnosis not present

## 2016-11-12 DIAGNOSIS — R059 Cough, unspecified: Secondary | ICD-10-CM

## 2016-11-12 DIAGNOSIS — Z720 Tobacco use: Secondary | ICD-10-CM | POA: Insufficient documentation

## 2016-11-12 DIAGNOSIS — G4733 Obstructive sleep apnea (adult) (pediatric): Secondary | ICD-10-CM | POA: Diagnosis not present

## 2016-11-12 DIAGNOSIS — J441 Chronic obstructive pulmonary disease with (acute) exacerbation: Secondary | ICD-10-CM

## 2016-11-12 DIAGNOSIS — R040 Epistaxis: Secondary | ICD-10-CM | POA: Insufficient documentation

## 2016-11-12 MED ORDER — HYDROCODONE-HOMATROPINE 5-1.5 MG/5ML PO SYRP: 5.0000 mL | ORAL_SOLUTION | Freq: Every evening | ORAL | 0 refills | Status: DC | PRN

## 2016-11-12 NOTE — Assessment & Plan Note (Signed)
COPD exacerbation resolved by PCP adding Symbicort, Singulair and Flonase to current medical regimen of duo nebs and Spiriva Plan CXR today Delsym for cough suppression Do not drive if sleepy. Continue Symbicort and Spiriva and Singulair as you have been doing Continue [employer] as prescribed. Continue Duonebs prescribed. We will call you with the results. Follow up in 3 months with Dr. Craige Cotta  Or NP. Please contact office for sooner follow up if pulmonary symptoms do not improve or worsen or seek emergency care

## 2016-11-12 NOTE — Assessment & Plan Note (Signed)
Patient would like to reestablish for CPAP treatment for OSA Patient states she has not used her CPAP device in over 6 months Plan We will schedule you for a sleep study to evaluate you for re-starting CPAP. We will call you with the results. Follow up in 3 months with Dr. Craige Cotta  Or NP. Please contact office for sooner follow up if pulmonary symptoms do not improve or worsen or seek emergency care

## 2016-11-12 NOTE — Progress Notes (Signed)
History of Present Illness Vanessa [name] is a 61 y.o. female  current every day smoker with a 40-pack-year smoking history and OSA , asthma and COPD. She is followed by Dr. Craige Cotta.   11/12/2016 Follow up for Dyspnea and Costochondritis: Patient was referred by her PCP for COPD exacerbation about 2 weeks ago. Patient states that since appointment was made, her PCP has added Symbicort, Singulair  and Flonase to her medical regimen, and her COPD exacerbation has resolved.She had previously been managed on Spiriva and Duo Nebs. She is not taking the duo nebs as prescribed 4 times daily, take some as needed. She states she is compliant with these medications. She presents today with complaints of  bleeding from her nose and mouth.  Last nose bleed was Tuesday 11/10/16. She states this is not unusual for her to have small amounts of blood in her nose. However she states that when she coughs hard, she now has blood coming from her mouth. In March she had tonsillectomy secondary to tonsillar abscess performed by Dr. Lazarus Salines, ENT. She has seen him once in follow-up, and not since then. She has been seen in the emergency department 07/2016 and 6/ 2018  for issues related to tonsillectomy. His she has been to the emergency room both on 10/25/2016 and 10/26/2016, and left part of being seen. She has a very raspy voice. She states that this is been an issue for several years, that it occurs at intervals. She complains of feeling a scab deep in her mouth, which I was unable to visualize. Patient is currently using Tessalon Perles for cough suppression per her PCP. She denies any weight loss, or any hemoptysis. She states when she coughs her secretions are clear to light yellow.  Patient also states that she wants to reestablish for CPAP therapy. She states she has not been wearing her CPAP for approximately 6 months. She states this is due to the fact her machine was too old and bright. She can tell she is not sleeping  as well, and has more daytime sleepiness spell not using it. Last sleep study was done November 2016. This will need to be repeated.  Test Results: 01/21/2015>> PFTs>>Mild restriction is suggested by spirometry. Lung volumes show mild restriction. Mild diffusion defect. Positive bronchodilator response.  Chest x-ray 11/12/2016 No active cardiopulmonary disease.  Cannot exclude bronchitis.   CBC Latest Ref Rng & Units 09/08/2016 08/13/2016 06/11/2016  WBC 4.0 - 10.5 K/uL 10.6(H) 14.7(H) 12.0(H)  Hemoglobin 12.0 - 15.0 g/dL 16.1 11.5(L) 11.2(L)  Hematocrit 36.0 - 46.0 % 37.1 35.2(L) 33.6(L)  Platelets 150 - 400 K/uL 215 251 193    BMP Latest Ref Rng & Units 09/08/2016 08/13/2016 06/11/2016  Glucose 65 - 99 mg/dL 84 096(E) 454(U)  BUN 6 - 20 mg/dL 8 15 12   Creatinine 0.44 - 1.00 mg/dL 9.81(X) 9.14(N) 8.29  Sodium 135 - 145 mmol/L 133(L) 130(L) 137  Potassium 3.5 - 5.1 mmol/L 3.8 3.9 4.4  Chloride 101 - 111 mmol/L 97(L) 97(L) 101  CO2 22 - 32 mmol/L 29 25 28   Calcium 8.9 - 10.3 mg/dL 9.3 8.9 5.6(O)     PFT    Component Value Date/Time   FEV1PRE 1.67 01/21/2015 1005   FEV1POST 1.91 01/21/2015 1005   FVCPRE 2.01 01/21/2015 1005   FVCPOST 2.23 01/21/2015 1005   TLC 4.15 01/21/2015 1005   DLCOUNC 19.63 01/21/2015 1005   PREFEV1FVCRT 83 01/21/2015 1005   PSTFEV1FVCRT 86 01/21/2015 1005    Dg Chest  2 View  Result Date: 11/12/2016 CLINICAL DATA:  Cough, congestion, shortness of breath for 2-3 weeks, some hemoptysis EXAM: CHEST  2 VIEW COMPARISON:  Chest x-ray of 09/08/2016 FINDINGS: No pneumonia or effusion is seen. Minimal peribronchial thickening is noted which can be seen with bronchitis. Mediastinal and hilar contours are unremarkable. The heart is within normal limits in size. Old healed right rib fractures are again present. IMPRESSION: No active cardiopulmonary disease.  Cannot exclude bronchitis. Electronically Signed   By: [name] M.D.   On: 11/12/2016 13:36     Past  medical hx Past Medical History:  Diagnosis Date  . Alcoholism (HCC)   . Allergic rhinitis   . Allergy   . Anxiety   . Arthritis    rheumatoid  . Asthma   . Back pain, chronic   . Chronic back pain 05/27/2012   Secondary to L4-5 herniated disc per patient  . Chronic headache   . COPD (chronic obstructive pulmonary disease) (HCC)   . Depression   . Diabetes mellitus   . Emphysema   . Emphysema of lung (HCC)   . Generalized anxiety disorder 05/26/2012  . GERD (gastroesophageal reflux disease)   . Grave's disease   . 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 Heart murmur   . Hernia, umbilical   . Herniated disc   . Herniated disc   . Hyperlipidemia   . Hypertension   . Menopause   . Neuropathy   . OSA (obstructive sleep apnea)    has a cpap  . Osteoporosis   . 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 Rheumatoid arthritis (HCC)   . Shingles   . Sleep apnea    wear c-pap  . Tear of lateral meniscus of right knee 10/13/2013  . Wears dentures    top  . Wears glasses      Social History  Substance Use Topics  . Smoking status: Current Every Day Smoker    Packs/day: 1.00    Years: 40.00    Types: Cigarettes  . Smokeless tobacco: Never Used  . Alcohol use No    Ms.Dials reports that she has been smoking Cigarettes.  She has a 40.00 pack-year smoking history. She has never used smokeless tobacco. She reports that she uses drugs, including Marijuana. She reports that she does not drink alcohol.  Tobacco Cessation: I have spent 3 minutes counseling patient on smoking cessation this visit.  Past surgical hx, Family hx, Social hx all reviewed.  Current Outpatient Prescriptions on File Prior to Visit  Medication Sig  . albuterol (PROAIR HFA) 108 (90 BASE) MCG/ACT inhaler Inhale 2 puffs into the lungs 4 (four) times daily. For COPD  . albuterol (PROVENTIL) (2.5 MG/3ML) 0.083% nebulizer solution Take 2.5 mg by nebulization  3 (three) times daily as needed for wheezing or shortness of breath.   Marland Kitchen amoxicillin-clavulanate (AUGMENTIN) 875-125 MG tablet Take 1 tablet by mouth 2 (two) times daily.  Marland Kitchen atorvastatin (LIPITOR) 40 MG tablet Take 1 tablet (40 mg total) by mouth daily.  . busPIRone (BUSPAR) 10 MG tablet Take 20 mg by mouth 2 (two) times daily.   . cycloSPORINE (RESTASIS) 0.05 % ophthalmic emulsion Place 1 drop into both eyes 2 (two) times daily.  Marland Kitchen HYDROcodone-acetaminophen (NORCO) 10-325 MG tablet Take 0.5-1 tablets by mouth every 6 (six) hours as needed for severe pain.   Marland Kitchen ipratropium-albuterol (DUONEB) 0.5-2.5 (3) MG/3ML SOLN Take 3 mLs by nebulization every  6 (six) hours as needed (for wheezing or shortness of breath).   Marland Kitchen levothyroxine (SYNTHROID, LEVOTHROID) 175 MCG tablet Take 1 tablet (175 mcg total) by mouth every morning.  . loratadine (CLARITIN) 10 MG tablet Take 10 mg by mouth daily.   . meloxicam (MOBIC) 15 MG tablet Take 15 mg by mouth daily.  . metFORMIN (GLUCOPHAGE) 1000 MG tablet Take 1 tablet (1,000 mg total) by mouth 2 (two) times daily. (Patient taking differently: Take 500 mg by mouth daily with breakfast. )  . nystatin (MYCOSTATIN) 100000 UNIT/ML suspension Take 5 mLs by mouth 3 (three) times daily. GARGLE AND SWALLOW  . oxyCODONE-acetaminophen (ROXICET) 5-325 MG tablet Take 1 tablet by mouth every 4 (four) hours as needed.  . pregabalin (LYRICA) 150 MG capsule Take 150 mg by mouth 2 (two) times daily.  . QUEtiapine (SEROQUEL) 50 MG tablet Take 50 mg by mouth at bedtime.  . sennosides-docusate sodium (SENOKOT-S) 8.6-50 MG tablet Take 2 tablets by mouth daily. (Patient taking differently: Take 2 tablets by mouth daily as needed for constipation. )  . tiotropium (SPIRIVA) 18 MCG inhalation capsule Place 1 capsule (18 mcg total) into inhaler and inhale daily.  . verapamil (CALAN) 40 MG tablet Take 40 mg by mouth 3 (three) times daily.   No current facility-administered medications on file  prior to visit.      Allergies  Allergen Reactions  . Suboxone [Buprenorphine Hcl-Naloxone Hcl] Itching    Vaginal itching   . Ciprofloxacin Hives and Itching    Hives  . Penicillins Hives and Itching    Has patient had a PCN reaction causing immediate rash, facial/tongue/throat swelling, SOB or lightheadedness with hypotension: Yes Has patient had a PCN reaction causing severe rash involving mucus membranes or skin necrosis: No Has patient had a PCN reaction that required hospitalization: No Has patient had a PCN reaction occurring within the last 10 years: No If all of the above answers are "NO", then may proceed with Cephalosporin use.     Review Of Systems:  Constitutional:   No  weight loss, night sweats,  Fevers, chills, fatigue, or  lassitude.  HEENT:   No headaches,  Difficulty swallowing,  Tooth/dental problems, or  Sore throat,                No sneezing, itching, ear ache, nasal congestion, post nasal drip, + hoarseness voice, + epistaxis  CV:  No chest pain,  Orthopnea, PND, swelling in lower extremities, anasarca, dizziness, palpitations, syncope.   GI  No heartburn, indigestion, abdominal pain, nausea, vomiting, diarrhea, change in bowel habits, loss of appetite, bloody stools.   Resp: No shortness of breath with exertion or at rest.  No excess mucus, no productive cough,  No non-productive cough,  No coughing up of blood.  No change in color of mucus.  No wheezing.  No chest wall deformity  Skin: no rash or lesions.  GU: no dysuria, change in color of urine, no urgency or frequency.  No flank pain, no hematuria   MS:  No joint pain or swelling.  No decreased range of motion.  No back pain.  Psych:  No change in mood or affect. No depression or anxiety.  No memory loss.   Vital Signs BP 122/68 (BP Location: Right Arm, Patient Position: Sitting, Cuff Size: Normal)   Pulse 62   Ht 5\' 9"  (1.753 m)   Wt 218 lb (98.9 kg)   SpO2 100%   BMI 32.19 kg/m  Physical Exam:  General- No distress,  A&Ox3, very talkative, difficult to follow ENT: No sinus tenderness, TM clear, pale nasal mucosa, no oral exudate,no post nasal drip, no LAN, no bleeding or dried blood noted in either nare  or back throat Cardiac: S1, S2, regular rate and rhythm, no murmur Chest: No wheeze/ rales/ dullness; no accessory muscle use, no nasal flaring, no sternal retractions Abd.: Soft Non-tender, nondistended, bowel sounds positive Ext: No clubbing cyanosis, edema Neuro:  normal strength Skin: No rashes, warm and dry Psych: normal mood and behavior, flighty affect   Assessment/Plan  Epistaxis, recurrent On and off oral/nosebleeds Current every day smoker Hoarse voice Plan Referral to [id number] new  nose bleeds. CXR today Delsym every 12 hours for cough suppression Do not drive if sleepy. Continue Tessalon Perles as prescribed. Continue Duonebs prescribed. Follow up in 3 months with Dr. Craige Cotta  Or NP. Please contact office for sooner follow up if pulmonary symptoms do not improve or worsen or seek emergency care     Tobacco abuse Counseled on smoking cessation, and ongoing health risks with continued tobacco abuse  COPD exacerbation COPD exacerbation resolved by PCP adding Symbicort, Singulair and Flonase to current medical regimen of duo nebs and Spiriva Plan CXR today Delsym for cough suppression Do not drive if sleepy. Continue Symbicort and Spiriva and Singulair as you have been doing Continue [employer] as prescribed. Continue Duonebs prescribed. We will call you with the results. Follow up in 3 months with Dr. Craige Cotta  Or NP. Please contact office for sooner follow up if pulmonary symptoms do not improve or worsen or seek emergency care     OSA (obstructive sleep apnea) Patient would like to reestablish for CPAP treatment for OSA Patient states she has not used her CPAP device in over 6 months Plan We will schedule you for a sleep  study to evaluate you for re-starting CPAP. We will call you with the results. Follow up in 3 months with Dr. Craige Cotta  Or NP. Please contact office for sooner follow up if pulmonary symptoms do not improve or worsen or seek emergency care       Bevelyn Ngo, NP 11/12/2016  2:06 PM

## 2016-11-12 NOTE — Assessment & Plan Note (Signed)
Counseled on smoking cessation, and ongoing health risks with continued tobacco abuse

## 2016-11-12 NOTE — Patient Instructions (Addendum)
It is nice to meet you today. Referral to [id number] new  nose bleeds. CXR today Hydromet 5 cc's at bedtime for cough suppression. Do not drive if sleepy. Continue Symbicort and Spiriva and Singulair as you have been doing Continue [employer] as prescribed. Continue Duonebs prescribed. We will schedule you for a sleep study to evaluate you for re-starting CPAP. We will call you with the results. Follow up in 3 months with Dr. Craige Cotta  Or NP. Please contact office for sooner follow up if pulmonary symptoms do not improve or worsen or seek emergency care

## 2016-11-12 NOTE — Assessment & Plan Note (Signed)
On and off oral/nosebleeds Current every day smoker Hoarse voice Plan Referral to [id number] new  nose bleeds. CXR today Delsym every 12 hours for cough suppression Do not drive if sleepy. Continue Tessalon Perles as prescribed. Continue Duonebs prescribed. Follow up in 3 months with Dr. Craige Cotta  Or NP. Please contact office for sooner follow up if pulmonary symptoms do not improve or worsen or seek emergency care

## 2016-11-13 ENCOUNTER — Other Ambulatory Visit: Payer: Self-pay | Admitting: *Deleted

## 2016-11-13 MED ORDER — PREDNISONE 10 MG PO TABS: ORAL_TABLET | ORAL | 0 refills | Status: DC

## 2016-11-15 NOTE — Progress Notes (Signed)
I have reviewed and agree with assessment/plan.  Coralyn Helling, MD [facility] 11/15/2016, 11:07 AM Pager:  [phone]

## 2016-11-16 ENCOUNTER — Telehealth: Payer: Self-pay | Admitting: Acute Care

## 2016-11-16 NOTE — Telephone Encounter (Signed)
Notes recorded by Pinion, Farley Ly, CMA on 11/13/2016 at 5:04 PM EDT After calling pt, when placing order for meds to be sent to pt's pharmacy, a warning came up with the Z-Pak associated with pt's seroquel. Kandice Robinsons, do you still want pt to be placed on Z-Pak or do you want another antibiotic to be sent in to pt's pharmacy?   Patient calling back to check on status of zpak. She stated that she had picked up the prednisone but the pharmacy did not have an order for the antibiotic. Pt uses [facility] on [address]    Sarah, please advise. Thanks!

## 2016-11-17 MED ORDER — DOXYCYCLINE HYCLATE 100 MG PO TABS: 100.0000 mg | ORAL_TABLET | Freq: Two times a day (BID) | ORAL | 0 refills | Status: DC

## 2016-11-17 NOTE — Telephone Encounter (Signed)
Doxycycline 100 mg twice daily x 7 days. Thanks, Maralyn Sago

## 2016-11-17 NOTE — Telephone Encounter (Signed)
Spoke with pt. She is aware of SG's recommendation. Rx has been sent in. Nothing further was needed.

## 2017-01-05 ENCOUNTER — Ambulatory Visit (HOSPITAL_BASED_OUTPATIENT_CLINIC_OR_DEPARTMENT_OTHER): Payer: Medicaid Other | Attending: Acute Care | Admitting: Pulmonary Disease

## 2017-01-05 VITALS — Ht 69.0 in | Wt 216.0 lb

## 2017-01-05 DIAGNOSIS — I10 Essential (primary) hypertension: Secondary | ICD-10-CM | POA: Insufficient documentation

## 2017-01-05 DIAGNOSIS — J449 Chronic obstructive pulmonary disease, unspecified: Secondary | ICD-10-CM | POA: Insufficient documentation

## 2017-01-05 DIAGNOSIS — G4733 Obstructive sleep apnea (adult) (pediatric): Secondary | ICD-10-CM | POA: Diagnosis not present

## 2017-01-05 DIAGNOSIS — E119 Type 2 diabetes mellitus without complications: Secondary | ICD-10-CM | POA: Insufficient documentation

## 2017-01-05 DIAGNOSIS — G4710 Hypersomnia, unspecified: Secondary | ICD-10-CM | POA: Insufficient documentation

## 2017-01-05 DIAGNOSIS — R5383 Other fatigue: Secondary | ICD-10-CM | POA: Diagnosis not present

## 2017-01-05 DIAGNOSIS — G4719 Other hypersomnia: Secondary | ICD-10-CM

## 2017-01-05 DIAGNOSIS — Z79899 Other long term (current) drug therapy: Secondary | ICD-10-CM | POA: Diagnosis not present

## 2017-01-07 ENCOUNTER — Telehealth: Payer: Self-pay | Admitting: Pulmonary Disease

## 2017-01-07 ENCOUNTER — Encounter (HOSPITAL_COMMUNITY): Payer: Self-pay | Admitting: Emergency Medicine

## 2017-01-07 ENCOUNTER — Emergency Department (HOSPITAL_COMMUNITY)
Admission: EM | Admit: 2017-01-07 | Discharge: 2017-01-08 | Disposition: A | Discharge status: Home / Self Care | Payer: Medicaid Other | Attending: Emergency Medicine | Admitting: Emergency Medicine

## 2017-01-07 DIAGNOSIS — F17210 Nicotine dependence, cigarettes, uncomplicated: Secondary | ICD-10-CM | POA: Diagnosis not present

## 2017-01-07 DIAGNOSIS — E039 Hypothyroidism, unspecified: Secondary | ICD-10-CM | POA: Insufficient documentation

## 2017-01-07 DIAGNOSIS — Z79899 Other long term (current) drug therapy: Secondary | ICD-10-CM | POA: Insufficient documentation

## 2017-01-07 DIAGNOSIS — G4719 Other hypersomnia: Secondary | ICD-10-CM

## 2017-01-07 DIAGNOSIS — Z880 Allergy status to penicillin: Secondary | ICD-10-CM | POA: Insufficient documentation

## 2017-01-07 DIAGNOSIS — J45909 Unspecified asthma, uncomplicated: Secondary | ICD-10-CM | POA: Diagnosis not present

## 2017-01-07 DIAGNOSIS — E119 Type 2 diabetes mellitus without complications: Secondary | ICD-10-CM | POA: Insufficient documentation

## 2017-01-07 DIAGNOSIS — L299 Pruritus, unspecified: Secondary | ICD-10-CM

## 2017-01-07 DIAGNOSIS — Z7984 Long term (current) use of oral hypoglycemic drugs: Secondary | ICD-10-CM | POA: Diagnosis not present

## 2017-01-07 DIAGNOSIS — I10 Essential (primary) hypertension: Secondary | ICD-10-CM | POA: Diagnosis not present

## 2017-01-07 DIAGNOSIS — J449 Chronic obstructive pulmonary disease, unspecified: Secondary | ICD-10-CM | POA: Diagnosis not present

## 2017-01-07 DIAGNOSIS — R21 Rash and other nonspecific skin eruption: Secondary | ICD-10-CM | POA: Insufficient documentation

## 2017-01-07 DIAGNOSIS — G4733 Obstructive sleep apnea (adult) (pediatric): Secondary | ICD-10-CM

## 2017-01-07 MED ORDER — HYDROXYZINE HCL 25 MG PO TABS
25.0000 mg | ORAL_TABLET | Freq: Once | ORAL | Status: AC
  Administered 2017-01-07: 25 mg via ORAL
  Filled 2017-01-07: qty 1

## 2017-01-07 MED ORDER — DEXAMETHASONE SODIUM PHOSPHATE 10 MG/ML IJ SOLN
10.0000 mg | Freq: Once | INTRAMUSCULAR | Status: AC
  Administered 2017-01-07: 10 mg via INTRAMUSCULAR
  Filled 2017-01-07: qty 1

## 2017-01-07 MED ORDER — FAMOTIDINE 20 MG PO TABS
20.0000 mg | ORAL_TABLET | Freq: Once | ORAL | Status: AC
  Administered 2017-01-07: 20 mg via ORAL
  Filled 2017-01-07: qty 1

## 2017-01-07 NOTE — Telephone Encounter (Signed)
Left voice mail on machine for patient to return phone call back regarding results and recommendations.  Will follow up again with patient at later date. 

## 2017-01-07 NOTE — Telephone Encounter (Signed)
PSG 01/05/17 >> AHI 28.8, SaO2 low 83%.  CPAP 9 cm H2O.  Centrals with higher pressures.   Will have my nurse inform pt that sleep study shows moderate sleep apnea.  Please send order for auto CPAP range 5 to 15 cm H2O with heated humidity and mask of choice.  She needs ROV rescheduled from 02/15/17 to be 2 months after getting CPAP set up.

## 2017-01-07 NOTE — ED Provider Notes (Signed)
[facility]-EMER[facility]ENCY DEPT Provider Note   CSN: [id number] Arrival date & time: 01/07/17  2050     History   Chief Complaint Chief Complaint  Patient presents with  . Bug Bites    HPI Vanessa [name] is a 61 y.o. female who presents to the ED with what she thinks are bites to the right upper back.  She states that she went for a sleep study last night and when she put the robe on that they gave her she felt something sting her. She continued to feel the sting all night. When she got home this morning she looked at the area and has been taking benadryl for the itching and stinging without relief. Now she feels like her entire back it itching.   The history is provided by the patient. No language interpreter was used.    Past Medical History:  Diagnosis Date  . Alcoholism (HCC)   . Allergic rhinitis   . Allergy   . Anxiety   . Arthritis    rheumatoid  . Asthma   . Back pain, chronic   . Chronic back pain 05/27/2012   Secondary to L4-5 herniated disc per patient  . Chronic headache   . COPD (chronic obstructive pulmonary disease) (HCC)   . Depression   . Diabetes mellitus   . Emphysema   . Emphysema of lung (HCC)   . Generalized anxiety disorder 05/26/2012  . GERD (gastroesophageal reflux disease)   . Grave's disease   . 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 Heart murmur   . Hernia, umbilical   . Herniated disc   . Herniated disc   . Hyperlipidemia   . Hypertension   . Menopause   . Neuropathy   . OSA (obstructive sleep apnea)    has a cpap  . Osteoporosis   . 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 Rheumatoid arthritis (HCC)   . Shingles   . Sleep apnea    wear c-pap  . Tear of lateral meniscus of right knee 10/13/2013  . Wears dentures    top  . Wears glasses     Patient Active Problem List   Diagnosis Date Noted  . Epistaxis, recurrent 11/12/2016  . Tobacco abuse 11/12/2016  . Pharyngeal mass 06/09/2016    . Alcohol abuse 06/09/2016  . Alcohol abuse, in remission 06/09/2016  . Atypical pneumonia 12/18/2014  . Essential hypertension 12/17/2014  . COPD exacerbation (HCC) 12/16/2014  . Tear of lateral meniscus of right knee 10/13/2013  . Chest pain 07/23/2013  . GERD (gastroesophageal reflux disease) 07/23/2013  . Morbid obesity (HCC) 07/23/2013  . Renal failure (ARF), acute on chronic (HCC) 07/23/2013  . HLD (hyperlipidemia) 07/23/2013  . Dysarthria 10/05/2012  . Diarrhea 05/27/2012  . Chronic back pain 05/27/2012  . Diabetes mellitus (HCC) 05/26/2012  . Hypothyroidism 05/26/2012  . 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 Depression 05/26/2012  . Generalized anxiety disorder 05/26/2012  . OSA (obstructive sleep apnea) 03/18/2011    Past Surgical History:  Procedure Laterality Date  . CHOLECYSTECTOMY    . EYE SURGERY     eye back in socker-rt  . FOOT SURGERY     cyst rt foot  . INCISION AND DRAINAGE OF PERITONSILLAR ABCESS Right 06/09/2016   Procedure: INCISION AND DRAINAGE OF PERITONSILLAR ABCESS;  Surgeon: Flo Shanks, MD;  Location: [facility] OR;  Service: ENT;  Laterality: Right;  . KNEE ARTHROSCOPY WITH LATERAL MENISECTOMY Right 10/13/2013  Procedure: RIGHT KNEE ARTHROSCOPY WITH PARTIAL LATERAL MENISECTOMY/DEBRIDEMENT/SHAVING ;  Surgeon: Eulas Post, MD;  Location: [facility];  Service: Orthopedics;  Laterality: Right;  . LAPAROSCOPY     age 70  . LEFT HEART CATHETERIZATION WITH CORONARY ANGIOGRAM N/A 05/30/2012   Procedure: LEFT HEART CATHETERIZATION WITH CORONARY ANGIOGRAM;  Surgeon: [name], MD;  Location: [facility] CATH LAB;  Service: Cardiovascular;  Laterality: N/A;  . TONSILLECTOMY    . TONSILLECTOMY AND ADENOIDECTOMY Right 06/09/2016   Procedure: RIGHT TONSILECTOMY;  Surgeon: Flo Shanks, MD;  Location: [facility] OR;  Service: ENT;  Laterality: Right;  . TUBAL LIGATION      OB History    Gravida Para Term Preterm AB Living   5 3     2 3    SAB  TAB Ectopic Multiple Live Births   2               Home Medications    Prior to Admission medications   Medication Sig Start Date End Date Taking? Authorizing Provider  albuterol (PROAIR HFA) 108 (90 BASE) MCG/ACT inhaler Inhale 2 puffs into the lungs 4 (four) times daily. For COPD    [provider]  albuterol (PROVENTIL) (2.5 MG/3ML) 0.083% nebulizer solution Take 2.5 mg by nebulization 3 (three) times daily as needed for wheezing or shortness of breath.     [provider]  amoxicillin-clavulanate (AUGMENTIN) 875-125 MG tablet Take 1 tablet by mouth 2 (two) times daily. 06/12/16   06/14/16, MD  atenolol (TENORMIN) 25 MG tablet Take 25 mg by mouth daily.    [provider]  atorvastatin (LIPITOR) 40 MG tablet Take 1 tablet (40 mg total) by mouth daily. 10/06/12   12/07/12, DO  budesonide-formoterol (SYMBICORT) 160-4.5 MCG/ACT inhaler Inhale 2 puffs into the lungs 2 (two) times daily.    [provider]  busPIRone (BUSPAR) 10 MG tablet Take 20 mg by mouth 2 (two) times daily.     [provider]  cycloSPORINE (RESTASIS) 0.05 % ophthalmic emulsion Place 1 drop into both eyes 2 (two) times daily.    [provider]  fluticasone (FLOVENT DISKUS) 50 MCG/BLIST diskus inhaler Inhale 1 puff into the lungs 2 (two) times daily.    [provider]  HYDROcodone-acetaminophen (NORCO) 10-325 MG tablet Take 0.5-1 tablets by mouth every 6 (six) hours as needed for severe pain.     [provider]  hydrOXYzine (ATARAX/VISTARIL) 25 MG tablet Take 1 tablet (25 mg total) by mouth every 6 (six) hours. 01/08/17   03/10/17, NP  ipratropium-albuterol (DUONEB) 0.5-2.5 (3) MG/3ML SOLN Take 3 mLs by nebulization every 6 (six) hours as needed (for wheezing or shortness of breath).     [provider]  levothyroxine (SYNTHROID, LEVOTHROID) 175 MCG tablet Take 1 tablet (175 mcg total) by mouth every morning. 03/10/16   14/12/17, PA-C  loratadine (CLARITIN) 10 MG tablet Take 10 mg by mouth daily.     [provider]  meloxicam (MOBIC) 15 MG tablet Take 15 mg by mouth daily.    [provider]  metFORMIN (GLUCOPHAGE) 1000 MG tablet Take 1 tablet (1,000 mg total) by mouth 2 (two) times daily. Patient taking differently: Take 500 mg by mouth daily with breakfast.  03/10/16   14/12/17, PA-C  nystatin (MYCOSTATIN) 100000 UNIT/ML suspension Take 5 mLs by mouth 3 (three) times daily. GARGLE AND SWALLOW    [provider]  oxyCODONE-acetaminophen (ROXICET) 5-325 MG  tablet Take 1 tablet by mouth every 4 (four) hours as needed. 06/12/16   Clydia Llano, MD  predniSONE (DELTASONE) 10 MG tablet Take  for 2 days, then  for 2 days,  for 2 days,  for 2 days, then stop 11/13/16   Bevelyn Ngo, NP  pregabalin (LYRICA) 150 MG capsule Take 150 mg by mouth 2 (two) times daily.    [provider]  QUEtiapine (SEROQUEL) 50 MG tablet Take 50 mg by mouth at bedtime.    [provider]  ranitidine (ZANTAC) 150 MG tablet Take 1 tablet (150 mg total) by mouth 2 (two) times daily. 01/08/17   Janne Napoleon, NP  sennosides-docusate sodium (SENOKOT-S) 8.6-50 MG tablet Take 2 tablets by mouth daily. Patient taking differently: Take 2 tablets by mouth daily as needed for constipation.  10/13/13   Teryl Lucy, MD  tiotropium (SPIRIVA) 18 MCG inhalation capsule Place 1 capsule (18 mcg total) into inhaler and inhale daily. 12/18/14   Valentino Nose, MD  verapamil (CALAN) 40 MG tablet Take 40 mg by mouth 3 (three) times daily.    [provider]    Family History Family History  Problem Relation Age of Onset  . Emphysema Mother   . Allergies Mother   . Asthma Mother   . Heart disease Mother   . Rheum arthritis Mother   . Diabetes Mother   . Kidney disease Mother   . Allergies Daughter   . Asthma Brother   . Breast cancer Other        aunt  . Lung cancer Brother   .  Throat cancer Brother   . Colon cancer Neg Hx   . Colon polyps Neg Hx   . Esophageal cancer Neg Hx   . Rectal cancer Neg Hx   . Stomach cancer Neg Hx     Social History Social History  Substance Use Topics  . Smoking status: Current Every Day Smoker    Packs/day: 1.00    Years: 40.00    Types: Cigarettes  . Smokeless tobacco: Never Used  . Alcohol use No     Allergies   Suboxone [buprenorphine hcl-naloxone hcl]; Ciprofloxacin; and Penicillins   Review of Systems Review of Systems  Constitutional: Negative for chills and fever.  HENT: Negative.   Respiratory: Negative for shortness of breath.   Cardiovascular: Negative for chest pain.  Gastrointestinal: Negative for nausea and vomiting.  Skin: Positive for rash.  Neurological: Negative for light-headedness and headaches.     Physical Exam Updated Vital Signs BP (!) 141/81 (BP Location: Right Arm)   Pulse 65   Temp 98.8 F (37.1 C) (Oral)   Resp 16   SpO2 97%   Physical Exam  Constitutional: No distress.  Obese A/A female  HENT:  Head: Normocephalic and atraumatic.  Mouth/Throat: Oropharynx is clear and moist.  Eyes: EOM are normal.  Neck: Neck supple.  Cardiovascular: Normal rate and regular rhythm.   Pulmonary/Chest: Effort normal. No respiratory distress. She has no wheezes. She has no rales.  Musculoskeletal: Normal range of motion.  Lymphadenopathy:    She has no cervical adenopathy.  Neurological: She is alert.  Skin: Skin is warm and dry.  There is an area to the right upper back that is slightly raised with mild erythema. That are several area where the patient has been scratching due to the itching.   Psychiatric: She has a normal mood and affect.  Nursing note and vitals reviewed.    ED  Treatments / Results  Labs (all labs ordered are listed, but only abnormal results are displayed) Labs Reviewed - No data to display  Radiology No results found.  Procedures Procedures (including  critical care time)  Medications Ordered in ED Medications  hydrOXYzine (ATARAX/VISTARIL) tablet 25 mg (25 mg Oral Given 01/07/17 2318)  famotidine (PEPCID) tablet 20 mg (20 mg Oral Given 01/07/17 2318)  dexamethasone (DECADRON) injection 10 mg (10 mg Intramuscular Given 01/07/17 2318)   After medications given in the ED patient reports that it helped the itching and she is ready to go home. Patient stable for d/c without swelling of the throat, difficulty swallowing or respiratory symptoms. Rx given for Zantac and Atarax. Patient to call her PCP tomorrow for f/u. Return precautions discussed.   Initial Impression / Assessment and Plan / ED Course  I have reviewed the triage vital signs and the nursing notes.  Final Clinical Impressions(s) / ED Diagnoses   Final diagnoses:  Pruritus    New Prescriptions Discharge Medication List as of 01/08/2017 12:29 AM    START taking these medications   Details  hydrOXYzine (ATARAX/VISTARIL) 25 MG tablet Take 1 tablet (25 mg total) by mouth every 6 (six) hours., Starting Fri 01/08/2017, Print    ranitidine (ZANTAC) 150 MG tablet Take 1 tablet (150 mg total) by mouth 2 (two) times daily., Starting Fri 01/08/2017, Print         [place], [place], NP 01/08/17 0131    Janne Napoleon, NP 01/08/17 8676    Derwood Kaplan, MD 01/09/17 2202

## 2017-01-07 NOTE — Procedures (Signed)
   Patient Name: Vanessa [name], Vanessa [name] Date: 01/05/2017 Gender: Female D.O.B: Aug 09, 1955 Age (years): 19 Referring Provider: Magdalen Spatz NP Height (inches): 22 Interpreting Physician: Chesley Mires MD, ABSM Weight (lbs): 216 RPSGT: Carolin Coy BMI: 33 MRN: [id number] Neck Size: 15.50  CLINICAL INFORMATION Sleep Study Type: Split Night CPAP  Indication for sleep study: COPD, Diabetes, Excessive Daytime Sleepiness, Fatigue, Hypertension, OSA, Snoring  Epworth Sleepiness Score: 7  SLEEP STUDY TECHNIQUE As per the AASM Manual for the Scoring of Sleep and Associated Events v2.3 (April 2016) with a hypopnea requiring 4% desaturations.  The channels recorded and monitored were frontal, central and occipital EEG, electrooculogram (EOG), submentalis EMG (chin), nasal and oral airflow, thoracic and abdominal wall motion, anterior tibialis EMG, snore microphone, electrocardiogram, and pulse oximetry. Continuous positive airway pressure (CPAP) was initiated when the patient met split night criteria and was titrated according to treat sleep-disordered breathing.  MEDICATIONS Medications self-administered by patient taken the night of the study : PRILOSEC, BENADRYL, NORCO, MOBIC  RESPIRATORY PARAMETERS Diagnostic  Total AHI (/hr): 28.8 RDI (/hr): 33.5 OA Index (/hr): 5.6 CA Index (/hr): 0.0 REM AHI (/hr): N/A NREM AHI (/hr): 28.8 Supine AHI (/hr): 31.1 Non-supine AHI (/hr): 16.36 Min O2 Sat (%): 83.00 Mean O2 (%): 90.41 Time below 88% (min): 16.6    Titration  Optimal Pressure (cm): 9 AHI at Optimal Pressure (/hr): 7.0 Min O2 at Optimal Pressure (%): 88.0 Supine % at Optimal (%): 100 Sleep % at Optimal (%): 92   SLEEP ARCHITECTURE The recording time for the entire night was 390.1 minutes.  During a baseline period of 166.1 minutes, the patient slept for 139.7 minutes in REM and nonREM, yielding a sleep efficiency of 84.1%. Sleep onset after lights out was 0.4 minutes with a REM  latency of N/A minutes. The patient spent 39.38% of the night in stage N1 sleep, 60.62% in stage N2 sleep, 0.00% in stage N3 and 0.00% in REM.  During the titration period of 219.4 minutes, the patient slept for 176.0 minutes in REM and nonREM, yielding a sleep efficiency of 80.2%. Sleep onset after CPAP initiation was 1.7 minutes with a REM latency of 164.0 minutes. The patient spent 26.99% of the night in stage N1 sleep, 50.00% in stage N2 sleep, 0.00% in stage N3 and 23.01% in REM.  CARDIAC DATA The 2 lead EKG demonstrated sinus rhythm. The mean heart rate was 65.01 beats per minute. Other EKG findings include: None.  LEG MOVEMENT DATA The total Periodic Limb Movements of Sleep (PLMS) were 0. The PLMS index was 0.00 .  IMPRESSIONS - This study showed moderate obstructive sleep apnea with an AHI of 28.8 and SaO2 low of 83%. - Optimal CPAP setting was 9 cm H2O.  She developed central apneas at higher pressure settings. - She did not require supplemental oxygen during the study.  DIAGNOSIS - Obstructive Sleep Apnea (327.23 [G47.33 ICD-10])  RECOMMENDATIONS - Trial of CPAP therapy on 9 cm H2O with a Medium size Philips Respironics Full Face Mask Dreamwear mask and heated humidification.  [Electronically signed] 01/07/2017 07:43 AM  Chesley Mires MD, ABSM Diplomate, American Board of Sleep Medicine   NPI: [id number]

## 2017-01-07 NOTE — ED Triage Notes (Signed)
Patient reports bug bite at right upper back yesterday with itchy skin rashes " stinging" unrelieved by OTC medications .

## 2017-01-08 ENCOUNTER — Telehealth: Payer: Self-pay

## 2017-01-08 DIAGNOSIS — G4733 Obstructive sleep apnea (adult) (pediatric): Secondary | ICD-10-CM

## 2017-01-08 MED ORDER — RANITIDINE HCL 150 MG PO TABS: 150.0000 mg | ORAL_TABLET | Freq: Two times a day (BID) | ORAL | 0 refills | Status: DC

## 2017-01-08 MED ORDER — HYDROXYZINE HCL 25 MG PO TABS: 25.0000 mg | ORAL_TABLET | Freq: Four times a day (QID) | ORAL | 0 refills | Status: DC

## 2017-01-08 NOTE — Telephone Encounter (Signed)
Vanessa Ngo, NP  Cydney Ok, CMA        Vanessa [name],  Please call patient and let her know that her sleep study indicated moderate obstructive sleep apnea. Recommendation is for CPAP trial on 9 cm water with a medium size Philips respirometer full facemask, dreamer mask and heated humidification. Please place water with DME and set patient up for therapy. Patient will need follow-up appointment 31-90 days after initiation of therapy to evaluate for effectiveness. Thank you so much   IMPRESSIONS  - This study showed moderate obstructive sleep apnea with an AHI of 28.8 and SaO2 low of 83%.  - Optimal CPAP setting was 9 cm H2O. She developed central apneas at higher pressure settings.  - She did not require supplemental oxygen during the study.    DIAGNOSIS  - Obstructive Sleep Apnea (327.23 [G47.33 ICD-10])

## 2017-01-08 NOTE — Discharge Instructions (Signed)
Take the medication as directed. Follow up with your doctor tomorrow for recheck. Return here as needed.

## 2017-01-08 NOTE — Telephone Encounter (Signed)
Spoke with pt and advised results from [facility]. Advised order will be placed for a CPAP machine. Nothing further is needed.

## 2017-01-11 NOTE — Telephone Encounter (Signed)
Called and spoke with patient today regarding results and recommendations regarding needing new set up for CPAP machine, mask and supplies. Spoke with patient and she is agreed to set up on CPAP machine today. Placed order, sending to [facility] for them to reach out to DME and pt for set up.   Advised patient with her results.Patient has verbalized understanding of recommendations and denied any questions or concerns at this time. Nothing further needed at this time.

## 2017-02-15 ENCOUNTER — Ambulatory Visit (INDEPENDENT_AMBULATORY_CARE_PROVIDER_SITE_OTHER): Payer: Medicaid Other | Admitting: Pulmonary Disease

## 2017-02-15 ENCOUNTER — Encounter: Payer: Self-pay | Admitting: Pulmonary Disease

## 2017-02-15 VITALS — BP 114/76 | HR 81 | Ht 68.0 in | Wt 214.4 lb

## 2017-02-15 DIAGNOSIS — G4733 Obstructive sleep apnea (adult) (pediatric): Secondary | ICD-10-CM | POA: Diagnosis not present

## 2017-02-15 DIAGNOSIS — J449 Chronic obstructive pulmonary disease, unspecified: Secondary | ICD-10-CM

## 2017-02-15 NOTE — Patient Instructions (Signed)
Will arrange for new CPAP set up and home nebulizer   Follow up in 3 months

## 2017-02-15 NOTE — Progress Notes (Signed)
Chief Complaint  Patient presents with  . Follow-up    Pt states that she is still waiting on her CPAP machine; states that she has called multiple times to get a replacement machine but has not been successful. DME: [facility]    Sleep tests PSG 02/15/06 > AHI 6 PSG 01/05/17 >> AHI 28.8, SaO2 low 83%.  CPAP 9 cm H2O.  Centrals with higher pressures.  Cardiac tests Echo 07/24/13 >> EF 55 to 60%, grade 1 diastolic dysfx, mild AI CT chest 12/06/14 >> 1.5 cm irregular nodular lesion LUL, 1.5 mm nodule Rt lung base, 3 mm nodule Rt lung base, 2 mm nodule Rt mid lung region  Pulmonary tests PFT 01/21/15 >> FEV1 1.91 (75%), FEV1% 86, TLC 4.15 (73%), DLCO 66%, + BD  Past medical history HTN, DM, HLD, HA, Neuropathy, Grave's disease with hypothyroidism, Anxiety, Depression, Chronic back pain, Rheumatoid arthritis, GERD, COPD/asthma  Past surgical history, Family history, Social history, Allergies reviewed  Vital signs BP 114/76 (BP Location: Left Arm, Cuff Size: Normal)   Pulse 81   Ht 5\' 8"  (1.727 m)   Wt 214 lb 6.4 oz (97.3 kg)   SpO2 97%   BMI 32.60 kg/m   History of Present Illness: Vanessa [name] is a 61 y.o. female smoker with OSA and COPD.  I saw her last in 2016.  She was most recently by 2017 in August 2018.  She had sleep study from October 2018 which showed moderate sleep apnea.  She did well with CPAP 9 cm H2O, but centrals with higher pressures.  She is still waiting for CPAP set up.  She is not having much cough, wheeze, or sputum.  She denies chest pain, fever, or hemoptysis.  No skin rash or leg swelling.  She was confused about her inhalers.  She has been using symbicort and flovent.  She didn't realized these both had steroid medicines.  She continues to smoke cigarettes.  Physical Exam:  General - No distress ENT - No sinus tenderness, no oral exudate, no LAN, raspy voice Cardiac - s1s2 regular, no murmur Chest - No wheeze/rales/dullness Back - No focal  tenderness Abd - Soft, non-tender Ext - No edema Neuro - Normal strength Skin - No rashes Psych - normal mood, and behavior   CMP Latest Ref Rng & Units 09/08/2016 08/13/2016 06/11/2016  Glucose 65 - 99 mg/dL 84 06/13/2016) 657(Q)  BUN 6 - 20 mg/dL 8 15 12   Creatinine 0.44 - 1.00 mg/dL 469(G) ) 2.95(M  Sodium 135 - 145 mmol/L 133(L) 130(L) 137  Potassium 3.5 - 5.1 mmol/L 3.8 3.9 4.4  Chloride 101 - 111 mmol/L 97(L) 97(L) 101  CO2 22 - 32 mmol/L 29 25 28   Calcium 8.9 - 10.3 mg/dL 9.3 8.9 8.41(L)  Total Protein 6.5 - 8.1 g/dL - - -  Total Bilirubin 0.3 - 1.2 mg/dL - - -  Alkaline Phos 38 - 126 U/L - - -  AST 15 - 41 U/L - - -  ALT 14 - 54 U/L - - -    CBC Latest Ref Rng & Units 09/08/2016 08/13/2016 06/11/2016  WBC 4.0 - 10.5 K/uL 10.6(H) 14.7(H) 12.0(H)  Hemoglobin 12.0 - 15.0 g/dL 11/08/2016 11.5(L) 11.2(L)  Hematocrit 36.0 - 46.0 % 37.1 35.2(L) 33.6(L)  Platelets 150 - 400 K/uL 215 251 193    ABG    Component Value Date/Time   PHART 7.316 (L) 05/26/2012 1120   PCO2ART 60.0 (HH) 05/26/2012 1120   PO2ART 89.7 05/26/2012 1120  HCO3 29.7 (H) 05/26/2012 1120   TCO2 30 06/26/2012 2056   O2SAT 96.8 05/26/2012 1120     Assessment/Plan:  Obstructive sleep apnea. - We discussed how sleep apnea can affect various health problems, including risks for hypertension, cardiovascular disease, and diabetes.  We also discussed how sleep disruption can increase risks for accidents, such as while driving.  Weight loss as a means of improving sleep apnea was also reviewed.  Additional treatment options discussed were CPAP therapy, oral appliance, and surgical intervention. - will make sure she gets set up with CPAP 9 cm H2O  COPD with chronic bronchitis. - continue spiriva, symbicort and prn albuterol - advised that she no longer needs to use flovent while on symbicort  Tobacco abuse. - discussed options to help with smoking cessation   Patient Instructions  Will arrange for new CPAP set up  and home nebulizer   Follow up in 3 months  Time spent 28 minutes  Coralyn Helling, MD [place] [facility] Pager:  [phone] 02/15/2017, 12:48

## 2017-02-21 ENCOUNTER — Encounter: Payer: Self-pay | Admitting: Pulmonary Disease

## 2017-03-03 ENCOUNTER — Other Ambulatory Visit: Payer: Self-pay | Admitting: Specialist

## 2017-03-03 DIAGNOSIS — N63 Unspecified lump in unspecified breast: Secondary | ICD-10-CM

## 2017-03-10 ENCOUNTER — Ambulatory Visit
Admission: RE | Admit: 2017-03-10 | Discharge: 2017-03-10 | Disposition: A | Discharge status: Home / Self Care | Payer: Medicaid Other | Source: Ambulatory Visit | Attending: Specialist | Admitting: Specialist

## 2017-03-10 ENCOUNTER — Ambulatory Visit: Payer: Medicaid Other

## 2017-03-10 DIAGNOSIS — N63 Unspecified lump in unspecified breast: Secondary | ICD-10-CM

## 2017-04-13 ENCOUNTER — Other Ambulatory Visit: Payer: Self-pay

## 2017-04-13 ENCOUNTER — Encounter (HOSPITAL_COMMUNITY): Payer: Self-pay | Admitting: Emergency Medicine

## 2017-04-13 ENCOUNTER — Emergency Department (HOSPITAL_COMMUNITY)
Admission: EM | Admit: 2017-04-13 | Discharge: 2017-04-14 | Disposition: A | Discharge status: Home / Self Care | Payer: Medicaid Other | Attending: Emergency Medicine | Admitting: Emergency Medicine

## 2017-04-13 ENCOUNTER — Emergency Department (HOSPITAL_COMMUNITY): Payer: Medicaid Other

## 2017-04-13 DIAGNOSIS — F17210 Nicotine dependence, cigarettes, uncomplicated: Secondary | ICD-10-CM | POA: Insufficient documentation

## 2017-04-13 DIAGNOSIS — J40 Bronchitis, not specified as acute or chronic: Secondary | ICD-10-CM | POA: Insufficient documentation

## 2017-04-13 DIAGNOSIS — Z7984 Long term (current) use of oral hypoglycemic drugs: Secondary | ICD-10-CM | POA: Insufficient documentation

## 2017-04-13 DIAGNOSIS — I10 Essential (primary) hypertension: Secondary | ICD-10-CM | POA: Insufficient documentation

## 2017-04-13 DIAGNOSIS — J449 Chronic obstructive pulmonary disease, unspecified: Secondary | ICD-10-CM | POA: Diagnosis not present

## 2017-04-13 DIAGNOSIS — R21 Rash and other nonspecific skin eruption: Secondary | ICD-10-CM | POA: Insufficient documentation

## 2017-04-13 DIAGNOSIS — R079 Chest pain, unspecified: Secondary | ICD-10-CM | POA: Diagnosis present

## 2017-04-13 DIAGNOSIS — G894 Chronic pain syndrome: Secondary | ICD-10-CM | POA: Diagnosis not present

## 2017-04-13 DIAGNOSIS — L989 Disorder of the skin and subcutaneous tissue, unspecified: Secondary | ICD-10-CM | POA: Insufficient documentation

## 2017-04-13 DIAGNOSIS — E119 Type 2 diabetes mellitus without complications: Secondary | ICD-10-CM | POA: Diagnosis not present

## 2017-04-13 DIAGNOSIS — Z79899 Other long term (current) drug therapy: Secondary | ICD-10-CM | POA: Insufficient documentation

## 2017-04-13 LAB — BASIC METABOLIC PANEL
Anion gap: 14 (ref 5–15)
BUN: 8 mg/dL (ref 6–20)
CO2: 25 mmol/L (ref 22–32)
CREATININE: 1.22 mg/dL — AB (ref 0.44–1.00)
Calcium: 9 mg/dL (ref 8.9–10.3)
Chloride: 94 mmol/L — ABNORMAL LOW (ref 101–111)
GFR calc Af Amer: 55 mL/min — ABNORMAL LOW (ref >60)
GFR, EST NON AFRICAN AMERICAN: 48 mL/min — AB (ref >60)
GLUCOSE: 219 mg/dL — AB (ref 65–99)
Potassium: 3.4 mmol/L — ABNORMAL LOW (ref 3.5–5.1)
SODIUM: 133 mmol/L — AB (ref 135–145)

## 2017-04-13 LAB — CBC
HEMATOCRIT: 40 % (ref 36.0–46.0)
Hemoglobin: 13.4 g/dL (ref 12.0–15.0)
MCH: 29.7 pg (ref 26.0–34.0)
MCHC: 33.5 g/dL (ref 30.0–36.0)
MCV: 88.7 fL (ref 78.0–100.0)
PLATELETS: 208 10*3/uL (ref 150–400)
RBC: 4.51 MIL/uL (ref 3.87–5.11)
RDW: 13.3 % (ref 11.5–15.5)
WBC: 15.5 10*3/uL — AB (ref 4.0–10.5)

## 2017-04-13 LAB — I-STAT BETA HCG BLOOD, ED (MC, WL, AP ONLY)

## 2017-04-13 LAB — I-STAT TROPONIN, ED: Troponin i, poc: 0 ng/mL (ref 0.00–0.08)

## 2017-04-13 MED ORDER — POTASSIUM CHLORIDE CRYS ER 20 MEQ PO TBCR
40.0000 meq | EXTENDED_RELEASE_TABLET | Freq: Once | ORAL | Status: AC
  Administered 2017-04-13: 40 meq via ORAL
  Filled 2017-04-13: qty 2

## 2017-04-13 MED ORDER — AZITHROMYCIN 250 MG PO TABS
500.0000 mg | ORAL_TABLET | Freq: Once | ORAL | Status: AC
  Administered 2017-04-13: 500 mg via ORAL
  Filled 2017-04-13: qty 2

## 2017-04-13 MED ORDER — KETOROLAC TROMETHAMINE 30 MG/ML IJ SOLN
15.0000 mg | Freq: Once | INTRAMUSCULAR | Status: AC
  Administered 2017-04-13: 15 mg via INTRAVENOUS
  Filled 2017-04-13: qty 1

## 2017-04-13 MED ORDER — SODIUM CHLORIDE 0.9 % IV BOLUS (SEPSIS)
1000.0000 mL | Freq: Once | INTRAVENOUS | Status: AC
  Administered 2017-04-13: 1000 mL via INTRAVENOUS

## 2017-04-13 MED ORDER — MORPHINE SULFATE (PF) 4 MG/ML IV SOLN
4.0000 mg | Freq: Once | INTRAVENOUS | Status: AC
  Administered 2017-04-13: 4 mg via INTRAVENOUS
  Filled 2017-04-13: qty 1

## 2017-04-13 NOTE — ED Notes (Signed)
IV attempted, IV team request

## 2017-04-13 NOTE — ED Notes (Signed)
Spoke with POD A PA stated will possible see patient as vertical 3 okay to place patient in waiting room with mask. Informed nurse first.

## 2017-04-13 NOTE — ED Triage Notes (Signed)
Recently diagnoses with shingles on buttocks and developed chest pain and shortness of breath.  Pain currently 10/10 burning along with general body achy.

## 2017-04-13 NOTE — ED Provider Notes (Signed)
[facility] EMERGENCY DEPARTMENT Provider Note   CSN: [id number] Arrival date & time: 04/13/17  1352     History   Chief Complaint Chief Complaint  Patient presents with  . Chest Pain  . Rash  . Shortness of Breath    HPI Vanessa [name] is a 62 y.o. female who presents with chest pain, SOB, and a rash. PMH significant for chronic pain syndrome, DM Type 2, asthma/COPD, Grave's disease, smoker. She states that her symptoms started approximately on Jan 2. She developed a "shingles" rash on her buttocks but it was not where it normally is.  She made an appointment to see her PCP who diagnosed her with shingles and started her on valacyclovir.  She states the rash cleared up and then reappeared where it normally does over the top of the buttocks.  She is also concerned regarding cold symptoms she has been having for over a week.  She has had headache, sore throat, watery eyes, diffuse chest pain and muscle aches, cough, leg cramping.  No fevers, vomiting, diarrhea.  She has been using her inhalers, Norco, Lyrica, Robaxin, lidoderm patches without relief.  She denies history of herpes.  HPI  Past Medical History:  Diagnosis Date  . Alcoholism (HCC)   . Allergic rhinitis   . Allergy   . Anxiety   . Arthritis    rheumatoid  . Asthma   . Back pain, chronic   . Chronic back pain 05/27/2012   Secondary to L4-5 herniated disc per patient  . Chronic headache   . COPD (chronic obstructive pulmonary disease) (HCC)   . Depression   . Diabetes mellitus   . Emphysema   . Emphysema of lung (HCC)   . Generalized anxiety disorder 05/26/2012  . GERD (gastroesophageal reflux disease)   . Grave's disease   . 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 Heart murmur   . Hernia, umbilical   . Herniated disc   . Herniated disc   . Hyperlipidemia   . Hypertension   . Menopause   . Neuropathy   . OSA (obstructive  sleep apnea)    has a cpap  . Osteoporosis   . 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 Rheumatoid arthritis (HCC)   . Shingles   . Sleep apnea    wear c-pap  . Tear of lateral meniscus of right knee 10/13/2013  . Wears dentures    top  . Wears glasses     Patient Active Problem List   Diagnosis Date Noted  . Epistaxis, recurrent 11/12/2016  . Tobacco abuse 11/12/2016  . Pharyngeal mass 06/09/2016  . Alcohol abuse 06/09/2016  . Alcohol abuse, in remission 06/09/2016  . Atypical pneumonia 12/18/2014  . Essential hypertension 12/17/2014  . COPD exacerbation (HCC) 12/16/2014  . Tear of lateral meniscus of right knee 10/13/2013  . Chest pain 07/23/2013  . GERD (gastroesophageal reflux disease) 07/23/2013  . Morbid obesity (HCC) 07/23/2013  . Renal failure (ARF), acute on chronic (HCC) 07/23/2013  . HLD (hyperlipidemia) 07/23/2013  . Dysarthria 10/05/2012  . Diarrhea 05/27/2012  . Chronic back pain 05/27/2012  . Diabetes mellitus (HCC) 05/26/2012  . Hypothyroidism 05/26/2012  . 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 Depression 05/26/2012  . Generalized anxiety disorder 05/26/2012  . OSA (obstructive sleep apnea) 03/18/2011    Past Surgical History:  Procedure Laterality Date  . CHOLECYSTECTOMY    . EYE SURGERY  eye back in socker-rt  . FOOT SURGERY     cyst rt foot  . INCISION AND DRAINAGE OF PERITONSILLAR ABCESS Right 06/09/2016   Procedure: INCISION AND DRAINAGE OF PERITONSILLAR ABCESS;  Surgeon: Flo Shanks, MD;  Location: [facility] OR;  Service: ENT;  Laterality: Right;  . KNEE ARTHROSCOPY WITH LATERAL MENISECTOMY Right 10/13/2013   Procedure: RIGHT KNEE ARTHROSCOPY WITH PARTIAL LATERAL MENISECTOMY/DEBRIDEMENT/SHAVING ;  Surgeon: Eulas Post, MD;  Location: [facility];  Service: Orthopedics;  Laterality: Right;  . LAPAROSCOPY     age 6  . LEFT HEART CATHETERIZATION WITH CORONARY ANGIOGRAM N/A 05/30/2012   Procedure: LEFT HEART CATHETERIZATION WITH  CORONARY ANGIOGRAM;  Surgeon: [name], MD;  Location: [facility] CATH LAB;  Service: Cardiovascular;  Laterality: N/A;  . TONSILLECTOMY    . TONSILLECTOMY AND ADENOIDECTOMY Right 06/09/2016   Procedure: RIGHT TONSILECTOMY;  Surgeon: Flo Shanks, MD;  Location: [facility] OR;  Service: ENT;  Laterality: Right;  . TUBAL LIGATION      OB History    Gravida Para Term Preterm AB Living   5 3     2 3    SAB TAB Ectopic Multiple Live Births   2               Home Medications    Prior to Admission medications   Medication Sig Start Date End Date Taking? Authorizing Provider  albuterol (PROAIR HFA) 108 (90 BASE) MCG/ACT inhaler Inhale 2 puffs into the lungs 4 (four) times daily. For COPD    [provider]  albuterol (PROVENTIL) (2.5 MG/3ML) 0.083% nebulizer solution Take 2.5 mg by nebulization 3 (three) times daily as needed for wheezing or shortness of breath.     [provider]  atenolol (TENORMIN) 25 MG tablet Take 25 mg by mouth daily.    [provider]  atorvastatin (LIPITOR) 40 MG tablet Take 1 tablet (40 mg total) by mouth daily. 10/06/12   12/07/12, DO  budesonide-formoterol (SYMBICORT) 160-4.5 MCG/ACT inhaler Inhale 2 puffs into the lungs 2 (two) times daily.    [provider]  busPIRone (BUSPAR) 10 MG tablet Take 20 mg by mouth 2 (two) times daily.     [provider]  cycloSPORINE (RESTASIS) 0.05 % ophthalmic emulsion Place 1 drop into both eyes 2 (two) times daily.    [provider]  fluticasone (FLOVENT DISKUS) 50 MCG/BLIST diskus inhaler Inhale 1 puff into the lungs 2 (two) times daily.    [provider]  HYDROcodone-acetaminophen (NORCO) 10-325 MG tablet Take 0.5-1 tablets by mouth every 6 (six) hours as needed for severe pain.     [provider]  hydrOXYzine (ATARAX/VISTARIL) 25 MG tablet Take 1 tablet (25 mg total) by mouth every 6 (six) hours. 01/08/17   03/10/17, NP  ipratropium-albuterol  (DUONEB) 0.5-2.5 (3) MG/3ML SOLN Take 3 mLs by nebulization every 6 (six) hours as needed (for wheezing or shortness of breath).     [provider]  levothyroxine (SYNTHROID, LEVOTHROID) 175 MCG tablet Take 1 tablet (175 mcg total) by mouth every morning. 03/10/16   14/12/17, PA-C  loratadine (CLARITIN) 10 MG tablet Take 10 mg by mouth daily.     [provider]  meloxicam (MOBIC) 15 MG tablet Take 15 mg by mouth daily.    [provider]  metFORMIN (GLUCOPHAGE) 1000 MG tablet Take 1 tablet (1,000 mg total) by mouth 2 (two) times daily. Patient taking differently: Take 500 mg by mouth  daily with breakfast.  03/10/16   Audry Pili, PA-C  nystatin (MYCOSTATIN) 100000 UNIT/ML suspension Take 5 mLs by mouth 3 (three) times daily. GARGLE AND SWALLOW    [provider]  pregabalin (LYRICA) 150 MG capsule Take 150 mg by mouth 2 (two) times daily.    [provider]  QUEtiapine (SEROQUEL) 50 MG tablet Take 50 mg by mouth at bedtime.    [provider]  ranitidine (ZANTAC) 150 MG tablet Take 1 tablet (150 mg total) by mouth 2 (two) times daily. 01/08/17   Janne Napoleon, NP  sennosides-docusate sodium (SENOKOT-S) 8.6-50 MG tablet Take 2 tablets by mouth daily. Patient taking differently: Take 2 tablets by mouth daily as needed for constipation.  10/13/13   Teryl Lucy, MD  tiotropium (SPIRIVA) 18 MCG inhalation capsule Place 1 capsule (18 mcg total) into inhaler and inhale daily. 12/18/14   Valentino Nose, MD  verapamil (CALAN) 40 MG tablet Take 40 mg by mouth 3 (three) times daily.    [provider]    Family History Family History  Problem Relation Age of Onset  . Emphysema Mother   . Allergies Mother   . Asthma Mother   . Heart disease Mother   . Rheum arthritis Mother   . Diabetes Mother   . Kidney disease Mother   . Allergies Daughter   . Asthma Brother   . Breast cancer Other        aunt  . Lung cancer Brother   . Throat  cancer Brother   . Colon cancer Neg Hx   . Colon polyps Neg Hx   . Esophageal cancer Neg Hx   . Rectal cancer Neg Hx   . Stomach cancer Neg Hx     Social History Social History   Tobacco Use  . Smoking status: Current Every Day Smoker    Packs/day: 1.00    Years: 40.00    Pack years: 40.00    Types: Cigarettes  . Smokeless tobacco: Never Used  Substance Use Topics  . Alcohol use: No    Alcohol/week: 0.0 oz  . Drug use: Yes    Types: Marijuana    Comment: opioids      Allergies   Suboxone [buprenorphine hcl-naloxone hcl]; Ciprofloxacin; and Penicillins   Review of Systems Review of Systems  Constitutional: Positive for fatigue. Negative for fever.  HENT: Positive for sore throat. Negative for congestion.   Eyes: Positive for discharge.  Respiratory: Positive for cough. Negative for shortness of breath.   Cardiovascular: Positive for chest pain.  Gastrointestinal: Negative for abdominal pain, diarrhea, nausea and vomiting.  Genitourinary: Negative for dysuria.  Musculoskeletal: Positive for back pain and myalgias.  Skin:       +rash  Neurological: Positive for headaches.  All other systems reviewed and are negative.    Physical Exam Updated Vital Signs BP 114/79 (BP Location: Left Arm)   Pulse 84   Temp 98.9 F (37.2 C) (Oral)   Resp 18   Ht 5\' 8"  (1.727 m)   Wt 97.1 kg (214 lb)   SpO2 96%   BMI 32.54 kg/m   Physical Exam  Constitutional: She is oriented to person, place, and time. She appears well-developed and well-nourished. No distress.  HENT:  Head: Normocephalic and atraumatic.  Right Ear: Hearing, tympanic membrane, external ear and ear canal normal.  Left Ear: Hearing, tympanic membrane, external ear and ear canal normal.  Nose: Nose normal.  Mouth/Throat: Uvula is midline, oropharynx is clear  and moist and mucous membranes are normal.  Eyes: Conjunctivae are normal. Pupils are equal, round, and reactive to light. No scleral icterus.    Exopthalmos. Watery discharge from bilateral eyes  Neck: Normal range of motion.  Cardiovascular: Normal rate and regular rhythm. Exam reveals no gallop and no friction rub.  No murmur heard. Pulmonary/Chest: Effort normal and breath sounds normal. No stridor. No respiratory distress. She has no wheezes. She has no rales. She exhibits no tenderness.  Abdominal: Soft. Bowel sounds are normal. She exhibits no distension. There is no tenderness.  Genitourinary:  Genitourinary Comments: Small, scattered lesions over gluteal cleft. No significant rash. Question herpes  Musculoskeletal:  Right lower extremity: No obvious swelling, deformity, or warmth. No tenderness. N/V intact.   Neurological: She is alert and oriented to person, place, and time.  Skin: Skin is warm and dry.  Psychiatric: She has a normal mood and affect. Her behavior is normal.  Nursing note and vitals reviewed.    ED Treatments / Results  Labs (all labs ordered are listed, but only abnormal results are displayed) Labs Reviewed  BASIC METABOLIC PANEL - Abnormal; Notable for the following components:      Result Value   Sodium 133 (*)    Potassium 3.4 (*)    Chloride 94 (*)    Glucose, Bld 219 (*)    Creatinine, Ser 1.22 (*)    GFR calc non Af Amer 48 (*)    GFR calc Af Amer 55 (*)    All other components within normal limits  CBC - Abnormal; Notable for the following components:   WBC 15.5 (*)    All other components within normal limits  HSV 2 ANTIBODY, IGG  I-STAT TROPONIN, ED  I-STAT BETA HCG BLOOD, ED ([facility], [facility], [facility] ONLY)    EKG  EKG Interpretation  Date/Time:  Tuesday April 13 2017 14:02:55 EST Ventricular Rate:  92 PR Interval:  148 QRS Duration: 92 QT Interval:  346 QTC Calculation: 427 R Axis:   -12 Text Interpretation:  Normal sinus rhythm Septal infarct , age undetermined Abnormal ECG No significant change since last tracing Confirmed by Drema Pry [id number]) on 04/13/2017 6:36:14 PM        Radiology Dg Chest 2 View  Result Date: 04/13/2017 CLINICAL DATA:  Chest pain EXAM: CHEST  2 VIEW COMPARISON:  11/12/2016 FINDINGS: The heart size and mediastinal contours are within normal limits. Both lungs are clear. The visualized skeletal structures are unremarkable. IMPRESSION: No active cardiopulmonary disease. Electronically Signed   By: Elige Ko   On: 04/13/2017 15:06    Procedures Procedures (including critical care time)  Medications Ordered in ED Medications  sodium chloride 0.9 % bolus 1,000 mL (0 mLs Intravenous Stopped 04/14/17 0017)  potassium chloride SA (K-DUR,KLOR-CON) CR tablet 40 mEq (40 mEq Oral Given 04/13/17 1933)  ketorolac (TORADOL) 30 MG/ML injection 15 mg (15 mg Intravenous Given 04/13/17 1939)  ketorolac (TORADOL) 30 MG/ML injection 15 mg (15 mg Intravenous Given 04/13/17 2219)  azithromycin (ZITHROMAX) tablet 500 mg (500 mg Oral Given 04/13/17 2218)  morphine 4 MG/ML injection 4 mg (4 mg Intravenous Given 04/13/17 2220)     Initial Impression / Assessment and Plan / ED Course  I have reviewed the triage vital signs and the nursing notes.  Pertinent labs & imaging results that were available during my care of the patient were reviewed by me and considered in my medical decision making (see chart for details).  62 year old female  with multiple complaints. Her main complaints are her cough/congestion and generalized malaise for a couple of weeks and the lesions on her buttocks which she states is shingles. I question she actually has herpes since lesions seem to cross midline. Nonetheless, she is being treated with antivirals. CBC is remarkable for leukocytosis of 15.5. She does have some chronic elevation of her WBC. BMP is remarkable for mild hyponatremia (133), hypokalemia (3.4), hypochloremia (94), hyperglycemia (219), and elevated SCr (1.22) which is consistent with mild dehydration and may explain her muscle cramping. EKG is NSR, Trop is 0. CXR is  negative. She was given fluids, pain medicine, potassium, and will be treated for subclinical CAP with a Z-pack. She was advised to follow up with her doctor and return if worsening.  Final Clinical Impressions(s) / ED Diagnoses   Final diagnoses:  Bronchitis  Chronic pain syndrome  Skin lesions    ED Discharge Orders    None       Bethel Born, PA-C 04/14/17 0046    Nira Conn, MD 04/16/17 [phone]

## 2017-04-13 NOTE — ED Notes (Signed)
Gave pt ice chips, per Kelly - PA. 

## 2017-04-14 MED ORDER — AZITHROMYCIN 250 MG PO TABS: 250.0000 mg | ORAL_TABLET | Freq: Every day | ORAL | 0 refills | Status: DC

## 2017-04-14 NOTE — Discharge Instructions (Signed)
Please make an appointment with Dr. Midge Aver to follow up Please continue your pain medicines prescribed to you Please continue Famciclovir for buttocks lesions Drink plenty of fluids Take Azithromycin for the next 4 days Return if worsening

## 2017-04-15 LAB — HSV 2 ANTIBODY, IGG: HSV 2 Glycoprotein G Ab, IgG: 16.1 index — ABNORMAL HIGH (ref 0.00–0.90)

## 2017-04-21 ENCOUNTER — Telehealth: Payer: Self-pay | Admitting: Pulmonary Disease

## 2017-04-21 NOTE — Telephone Encounter (Signed)
rec'd cpap download on pt dated for 03-19-17 to 04-17-17 Currently VS is out of office this week When he returns he will review  Placed in green folder in VS cubby

## 2017-05-08 ENCOUNTER — Telehealth: Payer: Self-pay | Admitting: Pulmonary Disease

## 2017-05-08 NOTE — Telephone Encounter (Signed)
Auto CPAP 03/19/17 to 04/17/17 >> used on 27 of 30 nights with average 6 hrs 23 min.  Average AHI 4 with median CPAP 12 and 95 th percentile CPAP 14 cm H2O.   Patient is due for follow up visit.  Please schedule ROV with me or NP to review current status of CPAP therapy and COPD.Vanessa [name]

## 2017-05-10 NOTE — Telephone Encounter (Signed)
Spoke with pt. She is aware that she is due for an OV. OV has been scheduled for 05/28/2017 at 11:30am. Pt only wanted to see VS. Nothing further was needed.

## 2017-05-28 ENCOUNTER — Ambulatory Visit: Payer: Medicaid Other | Admitting: Pulmonary Disease

## 2017-06-15 ENCOUNTER — Encounter: Payer: Self-pay | Admitting: Nurse Practitioner

## 2017-07-14 ENCOUNTER — Encounter (HOSPITAL_COMMUNITY): Payer: Self-pay

## 2017-07-14 ENCOUNTER — Emergency Department (HOSPITAL_COMMUNITY): Payer: Medicaid Other

## 2017-07-14 ENCOUNTER — Other Ambulatory Visit: Payer: Self-pay

## 2017-07-14 ENCOUNTER — Emergency Department (HOSPITAL_COMMUNITY)
Admission: EM | Admit: 2017-07-14 | Discharge: 2017-07-15 | Disposition: A | Discharge status: Home / Self Care | Payer: Medicaid Other | Attending: Emergency Medicine | Admitting: Emergency Medicine

## 2017-07-14 DIAGNOSIS — Z7984 Long term (current) use of oral hypoglycemic drugs: Secondary | ICD-10-CM | POA: Diagnosis not present

## 2017-07-14 DIAGNOSIS — M545 Low back pain: Secondary | ICD-10-CM | POA: Insufficient documentation

## 2017-07-14 DIAGNOSIS — R531 Weakness: Secondary | ICD-10-CM | POA: Diagnosis not present

## 2017-07-14 DIAGNOSIS — Z79899 Other long term (current) drug therapy: Secondary | ICD-10-CM | POA: Diagnosis not present

## 2017-07-14 DIAGNOSIS — F17210 Nicotine dependence, cigarettes, uncomplicated: Secondary | ICD-10-CM | POA: Insufficient documentation

## 2017-07-14 DIAGNOSIS — M5441 Lumbago with sciatica, right side: Secondary | ICD-10-CM

## 2017-07-14 DIAGNOSIS — I10 Essential (primary) hypertension: Secondary | ICD-10-CM | POA: Insufficient documentation

## 2017-07-14 DIAGNOSIS — G8929 Other chronic pain: Secondary | ICD-10-CM

## 2017-07-14 DIAGNOSIS — E119 Type 2 diabetes mellitus without complications: Secondary | ICD-10-CM | POA: Diagnosis not present

## 2017-07-14 DIAGNOSIS — J449 Chronic obstructive pulmonary disease, unspecified: Secondary | ICD-10-CM | POA: Insufficient documentation

## 2017-07-14 DIAGNOSIS — E0500 Thyrotoxicosis with diffuse goiter without thyrotoxic crisis or storm: Secondary | ICD-10-CM | POA: Diagnosis not present

## 2017-07-14 DIAGNOSIS — J45909 Unspecified asthma, uncomplicated: Secondary | ICD-10-CM | POA: Insufficient documentation

## 2017-07-14 LAB — URINALYSIS, ROUTINE W REFLEX MICROSCOPIC
BILIRUBIN URINE: NEGATIVE
GLUCOSE, UA: NEGATIVE mg/dL
Hgb urine dipstick: NEGATIVE
Ketones, ur: NEGATIVE mg/dL
Leukocytes, UA: NEGATIVE
Nitrite: NEGATIVE
PH: 7 (ref 5.0–8.0)
Protein, ur: NEGATIVE mg/dL
Specific Gravity, Urine: 1.005 (ref 1.005–1.030)

## 2017-07-14 LAB — I-STAT BETA HCG BLOOD, ED (MC, WL, AP ONLY)

## 2017-07-14 LAB — CBC WITH DIFFERENTIAL/PLATELET
Basophils Absolute: 0 10*3/uL (ref 0.0–0.1)
Basophils Relative: 0 %
EOS ABS: 0.2 10*3/uL (ref 0.0–0.7)
Eosinophils Relative: 2 %
HEMATOCRIT: 38 % (ref 36.0–46.0)
HEMOGLOBIN: 12.6 g/dL (ref 12.0–15.0)
LYMPHS ABS: 3.2 10*3/uL (ref 0.7–4.0)
LYMPHS PCT: 27 %
MCH: 29.3 pg (ref 26.0–34.0)
MCHC: 33.2 g/dL (ref 30.0–36.0)
MCV: 88.4 fL (ref 78.0–100.0)
MONOS PCT: 5 %
Monocytes Absolute: 0.6 10*3/uL (ref 0.1–1.0)
NEUTROS PCT: 66 %
Neutro Abs: 8 10*3/uL — ABNORMAL HIGH (ref 1.7–7.7)
Platelets: 222 10*3/uL (ref 150–400)
RBC: 4.3 MIL/uL (ref 3.87–5.11)
RDW: 13.7 % (ref 11.5–15.5)
WBC: 11.9 10*3/uL — AB (ref 4.0–10.5)

## 2017-07-14 LAB — COMPREHENSIVE METABOLIC PANEL
ALT: 33 U/L (ref 14–54)
AST: 46 U/L — AB (ref 15–41)
Albumin: 4.4 g/dL (ref 3.5–5.0)
Alkaline Phosphatase: 65 U/L (ref 38–126)
Anion gap: 10 (ref 5–15)
BUN: 5 mg/dL — ABNORMAL LOW (ref 6–20)
CHLORIDE: 96 mmol/L — AB (ref 101–111)
CO2: 27 mmol/L (ref 22–32)
Calcium: 8.9 mg/dL (ref 8.9–10.3)
Creatinine, Ser: 1.07 mg/dL — ABNORMAL HIGH (ref 0.44–1.00)
GFR, EST NON AFRICAN AMERICAN: 55 mL/min — AB (ref >60)
Glucose, Bld: 94 mg/dL (ref 65–99)
POTASSIUM: 3.7 mmol/L (ref 3.5–5.1)
SODIUM: 133 mmol/L — AB (ref 135–145)
Total Bilirubin: 0.6 mg/dL (ref 0.3–1.2)
Total Protein: 6.9 g/dL (ref 6.5–8.1)

## 2017-07-14 NOTE — ED Triage Notes (Signed)
Pt endorses lower back pain x 2 weeks, and endorses right leg pain/numbness x 2 days. Pt endorses nausea and generalized pain that began today. VSS

## 2017-07-14 NOTE — ED Notes (Signed)
Urine culture save tube sent down with urine specimen.  

## 2017-07-15 NOTE — Discharge Instructions (Addendum)
Follow up with your doctor if symptoms persist or worsen.

## 2017-07-15 NOTE — ED Provider Notes (Signed)
[facility] EMERGENCY DEPARTMENT Provider Note   CSN: [id number] Arrival date & time: 07/14/17  4920     History   Chief Complaint Chief Complaint  Patient presents with  . Back Pain    HPI Vanessa [name] is a 62 y.o. female.  The patient presents with complaint of right lower back pain that started 3 weeks ago. No known injury. No radiation at that time. Within a week of onset, the pain moved across the lower back to the left side, and caused severe, sharp pain midline. No radiation to the LE's. The pain eventually came around to involve the abdomen. No nausea, vomiting, fever, diarrhea or constipation. She has been lying in bed for 2 days reporting she has been unable to get up. When she was able to walk she states her right leg "felt like it wasn't there". She has had lightheadedness and felt off balance. She was seeing floaters. She was concerned for elevated blood sugar but could not check it because her glucometer's battery was dead. She states her concern in pneumonia though she denies cough or fever. She is managed by Pain Management for chronic back pain and is taking her regular pain medication without relief of current back pain.    The history is provided by the patient. No language interpreter was used.    Past Medical History:  Diagnosis Date  . Alcoholism (HCC)   . Allergic rhinitis   . Allergy   . Anxiety   . Arthritis    rheumatoid  . Asthma   . Back pain, chronic   . Chronic back pain 05/27/2012   Secondary to L4-5 herniated disc per patient  . Chronic headache   . COPD (chronic obstructive pulmonary disease) (HCC)   . Depression   . Diabetes mellitus   . Emphysema   . Emphysema of lung (HCC)   . Generalized anxiety disorder 05/26/2012  . GERD (gastroesophageal reflux disease)   . Grave's disease   . 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 Heart murmur   . Hernia,  umbilical   . Herniated disc   . Herniated disc   . Hyperlipidemia   . Hypertension   . Menopause   . Neuropathy   . OSA (obstructive sleep apnea)    has a cpap  . Osteoporosis   . 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 Rheumatoid arthritis (HCC)   . Shingles   . Sleep apnea    wear c-pap  . Tear of lateral meniscus of right knee 10/13/2013  . Wears dentures    top  . Wears glasses     Patient Active Problem List   Diagnosis Date Noted  . Epistaxis, recurrent 11/12/2016  . Tobacco abuse 11/12/2016  . Pharyngeal mass 06/09/2016  . Alcohol abuse 06/09/2016  . Alcohol abuse, in remission 06/09/2016  . Atypical pneumonia 12/18/2014  . Essential hypertension 12/17/2014  . COPD exacerbation (HCC) 12/16/2014  . Tear of lateral meniscus of right knee 10/13/2013  . Chest pain 07/23/2013  . GERD (gastroesophageal reflux disease) 07/23/2013  . Morbid obesity (HCC) 07/23/2013  . Renal failure (ARF), acute on chronic (HCC) 07/23/2013  . HLD (hyperlipidemia) 07/23/2013  . Dysarthria 10/05/2012  . Diarrhea 05/27/2012  . Chronic back pain 05/27/2012  . Diabetes mellitus (HCC) 05/26/2012  . Hypothyroidism 05/26/2012  . 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 Depression 05/26/2012  . Generalized anxiety disorder 05/26/2012  . OSA (obstructive  sleep apnea) 03/18/2011    Past Surgical History:  Procedure Laterality Date  . CHOLECYSTECTOMY    . EYE SURGERY     eye back in socker-rt  . FOOT SURGERY     cyst rt foot  . INCISION AND DRAINAGE OF PERITONSILLAR ABCESS Right 06/09/2016   Procedure: INCISION AND DRAINAGE OF PERITONSILLAR ABCESS;  Surgeon: Flo Shanks, MD;  Location: [facility] OR;  Service: ENT;  Laterality: Right;  . KNEE ARTHROSCOPY WITH LATERAL MENISECTOMY Right 10/13/2013   Procedure: RIGHT KNEE ARTHROSCOPY WITH PARTIAL LATERAL MENISECTOMY/DEBRIDEMENT/SHAVING ;  Surgeon: Eulas Post, MD;  Location: [facility];  Service: Orthopedics;  Laterality: Right;  .  LAPAROSCOPY     age 56  . LEFT HEART CATHETERIZATION WITH CORONARY ANGIOGRAM N/A 05/30/2012   Procedure: LEFT HEART CATHETERIZATION WITH CORONARY ANGIOGRAM;  Surgeon: [name], MD;  Location: [facility] C[facility] LAB;  Service: Cardiovascular;  Laterality: N/A;  . TONSILLECTOMY    . TONSILLECTOMY AND ADENOIDECTOMY Right 06/09/2016   Procedure: RIGHT TONSILECTOMY;  Surgeon: Flo Shanks, MD;  Location: [facility] OR;  Service: ENT;  Laterality: Right;  . TUBAL LIGATION       OB History    Gravida  5   Para  3   Term      Preterm      AB  2   Living  3     SAB  2   TAB      Ectopic      Multiple      Live Births               Home Medications    Prior to Admission medications   Medication Sig Start Date End Date Taking? Authorizing Provider  albuterol (PROAIR HFA) 108 (90 BASE) MCG/ACT inhaler Inhale 2 puffs into the lungs 4 (four) times daily. For COPD    [provider]  albuterol (PROVENTIL) (2.5 MG/3ML) 0.083% nebulizer solution Take 2.5 mg by nebulization 3 (three) times daily as needed for wheezing or shortness of breath.     [provider]  atenolol (TENORMIN) 25 MG tablet Take 25 mg by mouth daily.    [provider]  atorvastatin (LIPITOR) 40 MG tablet Take 1 tablet (40 mg total) by mouth daily. 10/06/12   Gust Rung, DO  azithromycin (ZITHROMAX) 250 MG tablet Take 1 tablet (250 mg total) by mouth daily. 04/14/17   Bethel Born, PA-C  budesonide-formoterol (SYMBICORT) 160-4.5 MCG/ACT inhaler Inhale 2 puffs into the lungs 2 (two) times daily.    [provider]  busPIRone (BUSPAR) 10 MG tablet Take 20 mg by mouth 2 (two) times daily.     [provider]  cycloSPORINE (RESTASIS) 0.05 % ophthalmic emulsion Place 1 drop into both eyes 2 (two) times daily.    [provider]  fluticasone (FLOVENT DISKUS) 50 MCG/BLIST diskus inhaler Inhale 1 puff into the lungs 2 (two) times daily.    [provider]    HYDROcodone-acetaminophen (NORCO) 10-325 MG tablet Take 0.5-1 tablets by mouth every 6 (six) hours as needed for severe pain.     [provider]  hydrOXYzine (ATARAX/VISTARIL) 25 MG tablet Take 1 tablet (25 mg total) by mouth every 6 (six) hours. 01/08/17   Janne Napoleon, NP  ipratropium-albuterol (DUONEB) 0.5-2.5 (3) MG/3ML SOLN Take 3 mLs by nebulization every 6 (six) hours as needed (for wheezing or shortness of breath).     [provider]  levothyroxine (SYNTHROID, LEVOTHROID)  175 MCG tablet Take 1 tablet (175 mcg total) by mouth every morning. 03/10/16   Audry Pili, PA-C  loratadine (CLARITIN) 10 MG tablet Take 10 mg by mouth daily.     [provider]  meloxicam (MOBIC) 15 MG tablet Take 15 mg by mouth daily.    [provider]  metFORMIN (GLUCOPHAGE) 1000 MG tablet Take 1 tablet (1,000 mg total) by mouth 2 (two) times daily. Patient taking differently: Take 500 mg by mouth daily with breakfast.  03/10/16   Audry Pili, PA-C  nystatin (MYCOSTATIN) 100000 UNIT/ML suspension Take 5 mLs by mouth 3 (three) times daily. GARGLE AND SWALLOW    [provider]  pregabalin (LYRICA) 150 MG capsule Take 150 mg by mouth 2 (two) times daily.    [provider]  QUEtiapine (SEROQUEL) 50 MG tablet Take 50 mg by mouth at bedtime.    [provider]  ranitidine (ZANTAC) 150 MG tablet Take 1 tablet (150 mg total) by mouth 2 (two) times daily. 01/08/17   Janne Napoleon, NP  sennosides-docusate sodium (SENOKOT-S) 8.6-50 MG tablet Take 2 tablets by mouth daily. Patient taking differently: Take 2 tablets by mouth daily as needed for constipation.  10/13/13   Teryl Lucy, MD  tiotropium (SPIRIVA) 18 MCG inhalation capsule Place 1 capsule (18 mcg total) into inhaler and inhale daily. 12/18/14   Valentino Nose, MD  verapamil (CALAN) 40 MG tablet Take 40 mg by mouth 3 (three) times daily.    [provider]    Family History Family History   Problem Relation Age of Onset  . Emphysema Mother   . Allergies Mother   . Asthma Mother   . Heart disease Mother   . Rheum arthritis Mother   . Diabetes Mother   . Kidney disease Mother   . Allergies Daughter   . Asthma Brother   . Breast cancer Other        aunt  . Lung cancer Brother   . Throat cancer Brother   . Colon cancer Neg Hx   . Colon polyps Neg Hx   . Esophageal cancer Neg Hx   . Rectal cancer Neg Hx   . Stomach cancer Neg Hx     Social History Social History   Tobacco Use  . Smoking status: Current Every Day Smoker    Packs/day: 1.00    Years: 40.00    Pack years: 40.00    Types: Cigarettes  . Smokeless tobacco: Never Used  Substance Use Topics  . Alcohol use: No    Alcohol/week: 0.0 oz  . Drug use: Yes    Types: Marijuana    Comment: opioids      Allergies   Fluocinolone; Suboxone [buprenorphine hcl-naloxone hcl]; Ciprofloxacin; and Penicillins   Review of Systems Review of Systems  Constitutional: Negative for chills and fever.  HENT: Negative.   Eyes: Negative for visual disturbance.       Exophthalmos   Respiratory: Negative.  Negative for cough and shortness of breath.   Cardiovascular: Negative.  Negative for chest pain.  Gastrointestinal: Positive for abdominal pain. Negative for nausea and vomiting.  Genitourinary: Negative.   Musculoskeletal: Positive for back pain.  Skin: Negative.   Neurological:       See HPI.     Physical Exam Updated Vital Signs BP (!) 158/100 (BP Location: Right Arm)   Pulse 73   Temp 98.7 F (37.1 C) (Oral)   Resp 19   Ht 5\' 9"  (1.753 m)  Wt 106.6 kg (235 lb)   SpO2 96%   BMI 34.70 kg/m   Physical Exam  Constitutional: She appears well-developed and well-nourished.  HENT:  Head: Normocephalic.  Neck: Normal range of motion. Neck supple.  Cardiovascular: Normal rate and regular rhythm.  Pulmonary/Chest: Effort normal and breath sounds normal. She has no wheezes. She has no rales.   Abdominal: Soft. Bowel sounds are normal. There is no tenderness. There is no rebound and no guarding.  Musculoskeletal: Normal range of motion.  Neurological: She is alert.  CN's 3-12 grossly intact. Speech is clear and focused. No facial asymmetry. No lateralizing weakness. Reflexes are equal. No deficits of coordination. Ambulatory without imbalance.    Skin: Skin is warm and dry. No rash noted.  Psychiatric: She has a normal mood and affect.     ED Treatments / Results  Labs (all labs ordered are listed, but only abnormal results are displayed) Labs Reviewed  COMPREHENSIVE METABOLIC PANEL - Abnormal; Notable for the following components:      Result Value   Sodium 133 (*)    Chloride 96 (*)    BUN 5 (*)    Creatinine, Ser 1.07 (*)    AST 46 (*)    GFR calc non Af Amer 55 (*)    All other components within normal limits  CBC WITH DIFFERENTIAL/PLATELET - Abnormal; Notable for the following components:   WBC 11.9 (*)    Neutro Abs 8.0 (*)    All other components within normal limits  URINALYSIS, ROUTINE W REFLEX MICROSCOPIC  I-STAT BETA HCG BLOOD, ED ([facility], [facility], [facility] ONLY)   Results for orders placed or performed during the hospital encounter of 07/14/17  Comprehensive metabolic panel  Result Value Ref Range   Sodium 133 (L) 135 - 145 mmol/L   Potassium 3.7 3.5 - 5.1 mmol/L   Chloride 96 (L) 101 - 111 mmol/L   CO2 27 22 - 32 mmol/L   Glucose, Bld 94 65 - 99 mg/dL   BUN 5 (L) 6 - 20 mg/dL   Creatinine, Ser 6.29 (H) 0.44 - 1.00 mg/dL   Calcium 8.9 8.9 - 52.8 mg/dL   Total Protein 6.9 6.5 - 8.1 g/dL   Albumin 4.4 3.5 - 5.0 g/dL   AST 46 (H) 15 - 41 U/L   ALT 33 14 - 54 U/L   Alkaline Phosphatase 65 38 - 126 U/L   Total Bilirubin 0.6 0.3 - 1.2 mg/dL   GFR calc non Af Amer 55 (L) >60 mL/min   GFR calc Af Amer >60 >60 mL/min   Anion gap 10 5 - 15  CBC with Differential  Result Value Ref Range   WBC 11.9 (H) 4.0 - 10.5 K/uL   RBC 4.30 3.87 - 5.11 MIL/uL   Hemoglobin 12.6  12.0 - 15.0 g/dL   HCT 41.3 24.4 - 01.0 %   MCV 88.4 78.0 - 100.0 fL   MCH 29.3 26.0 - 34.0 pg   MCHC 33.2 30.0 - 36.0 g/dL   RDW 27.2 53.6 - 64.4 %   Platelets 222 150 - 400 K/uL   Neutrophils Relative % 66 %   Neutro Abs 8.0 (H) 1.7 - 7.7 K/uL   Lymphocytes Relative 27 %   Lymphs Abs 3.2 0.7 - 4.0 K/uL   Monocytes Relative 5 %   Monocytes Absolute 0.6 0.1 - 1.0 K/uL   Eosinophils Relative 2 %   Eosinophils Absolute 0.2 0.0 - 0.7 K/uL   Basophils Relative 0 %  Basophils Absolute 0.0 0.0 - 0.1 K/uL  Urinalysis, Routine w reflex microscopic  Result Value Ref Range   Color, Urine YELLOW YELLOW   APPearance CLEAR CLEAR   Specific Gravity, Urine 1.005 1.005 - 1.030   pH 7.0 5.0 - 8.0   Glucose, UA NEGATIVE NEGATIVE mg/dL   Hgb urine dipstick NEGATIVE NEGATIVE   Bilirubin Urine NEGATIVE NEGATIVE   Ketones, ur NEGATIVE NEGATIVE mg/dL   Protein, ur NEGATIVE NEGATIVE mg/dL   Nitrite NEGATIVE NEGATIVE   Leukocytes, UA NEGATIVE NEGATIVE  I-Stat beta hCG blood, ED  Result Value Ref Range   I-stat hCG, quantitative <5.0 <5 mIU/mL   Comment 3             EKG None  Radiology Dg Chest 2 View  Result Date: 07/14/2017 CLINICAL DATA:  62 y/o  F; rule out pneumonia. EXAM: CHEST - 2 VIEW COMPARISON:  04/13/2017 chest radiograph FINDINGS: Stable heart size and mediastinal contours are within normal limits. Both lungs are clear. The visualized skeletal structures are unremarkable. IMPRESSION: No acute pulmonary process identified. Electronically Signed   By: Mitzi Hansen M.D.   On: 07/14/2017 20:39    Procedures Procedures (including critical care time)  Medications Ordered in ED Medications - No data to display   Initial Impression / Assessment and Plan / ED Course  I have reviewed the triage vital signs and the nursing notes.  Pertinent labs & imaging results that were available during my care of the patient were reviewed by me and considered in my medical decision  making (see chart for details).     Patient presents with low back pain that progressed over the last 3 weeks to include bilateral lower back and generalized abdomen. No cough, fever, vomiting, diarrhea. She expresses concern for having pneumonia. She also reports vague neurologic symptoms of numbness of right leg, feeling off balance and seeing floaters.   Neurologic exam is intact. No deficits. Ambulatory without ataxia. Back pain does not limit her ability for full function and movement. Abdominal exam benign. Lungs clear.   Labs are essentially normal. CXR clear. Feel she is appropriate for discharge home. Patient agrees and is comfortable with discharge.   Final Clinical Impressions(s) / ED Diagnoses   Final diagnoses:  None   1. Low back pain 2. Nonspecific weakness  ED Discharge Orders    None       Danne Harbor 07/15/17 0117    Zadie Rhine, MD 07/15/17 [phone]

## 2017-07-15 NOTE — ED Notes (Signed)
Patient verbalizes understanding of discharge instructions. Opportunity for questioning and answers were provided. Armband removed by staff, pt discharged from ED ambulatory.   

## 2017-07-22 NOTE — Telephone Encounter (Signed)
See 2.9.19 phone note

## 2017-08-04 ENCOUNTER — Other Ambulatory Visit: Payer: Self-pay

## 2017-08-04 ENCOUNTER — Emergency Department (HOSPITAL_COMMUNITY)
Admission: EM | Admit: 2017-08-04 | Discharge: 2017-08-04 | Disposition: A | Discharge status: Home / Self Care | Payer: Medicaid Other | Attending: Emergency Medicine | Admitting: Emergency Medicine

## 2017-08-04 DIAGNOSIS — R21 Rash and other nonspecific skin eruption: Secondary | ICD-10-CM | POA: Diagnosis present

## 2017-08-04 DIAGNOSIS — Z5321 Procedure and treatment not carried out due to patient leaving prior to being seen by health care provider: Secondary | ICD-10-CM | POA: Diagnosis not present

## 2017-08-04 NOTE — ED Notes (Signed)
Pt asks about 'place in line'. Pt told about wait times. Pt does not want to stay. Pt encouraged to see provider before leaving. Pt refuses. Wristband removed and labels returned. Pt seen leaving lobby to parking lot.

## 2017-08-04 NOTE — ED Triage Notes (Addendum)
Patient c/o rash to posterior left axilla. Currently taking Acyclovir for dx of shingles 4 months ago. Wheal visible with a few tiny bumps.

## 2017-08-04 NOTE — ED Notes (Signed)
Patient states that she would follow up w/ dr. Does not want to wait to be seen.

## 2017-09-02 ENCOUNTER — Other Ambulatory Visit: Payer: Self-pay

## 2017-09-02 ENCOUNTER — Emergency Department (HOSPITAL_COMMUNITY)
Admission: EM | Admit: 2017-09-02 | Discharge: 2017-09-03 | Disposition: A | Discharge status: Home / Self Care | Payer: Medicaid Other | Attending: Emergency Medicine | Admitting: Emergency Medicine

## 2017-09-02 ENCOUNTER — Encounter (HOSPITAL_COMMUNITY): Payer: Self-pay | Admitting: *Deleted

## 2017-09-02 DIAGNOSIS — Z7984 Long term (current) use of oral hypoglycemic drugs: Secondary | ICD-10-CM | POA: Diagnosis not present

## 2017-09-02 DIAGNOSIS — J449 Chronic obstructive pulmonary disease, unspecified: Secondary | ICD-10-CM | POA: Insufficient documentation

## 2017-09-02 DIAGNOSIS — Z79899 Other long term (current) drug therapy: Secondary | ICD-10-CM | POA: Insufficient documentation

## 2017-09-02 DIAGNOSIS — K5900 Constipation, unspecified: Secondary | ICD-10-CM | POA: Insufficient documentation

## 2017-09-02 DIAGNOSIS — L299 Pruritus, unspecified: Secondary | ICD-10-CM

## 2017-09-02 DIAGNOSIS — R21 Rash and other nonspecific skin eruption: Secondary | ICD-10-CM | POA: Diagnosis not present

## 2017-09-02 DIAGNOSIS — I10 Essential (primary) hypertension: Secondary | ICD-10-CM | POA: Insufficient documentation

## 2017-09-02 DIAGNOSIS — E1140 Type 2 diabetes mellitus with diabetic neuropathy, unspecified: Secondary | ICD-10-CM | POA: Diagnosis not present

## 2017-09-02 DIAGNOSIS — F17210 Nicotine dependence, cigarettes, uncomplicated: Secondary | ICD-10-CM | POA: Insufficient documentation

## 2017-09-02 NOTE — ED Triage Notes (Signed)
For 8 days, pt has had rash to the back, axilla and to the face area. Drainage to bilateral eyes. Pt says that she is having itching and burning and not able to sleep because of it.

## 2017-09-02 NOTE — ED Triage Notes (Signed)
At end of triage, pt reports she had diarrhea earlier in the week but is now constipated.

## 2017-09-03 MED ORDER — PERMETHRIN 5 % EX CREA: 1.0000 "application " | TOPICAL_CREAM | Freq: Once | CUTANEOUS | 0 refills | Status: AC

## 2017-09-03 MED ORDER — HYDROXYZINE HCL 25 MG PO TABS
25.0000 mg | ORAL_TABLET | Freq: Once | ORAL | Status: AC
  Administered 2017-09-03: 25 mg via ORAL
  Filled 2017-09-03: qty 1

## 2017-09-03 NOTE — ED Provider Notes (Signed)
[facility] EMERGENCY DEPARTMENT Provider Note  CSN: [id number] Arrival date & time: 09/02/17 2034  Chief Complaint(s) Rash and Constipation  HPI Vanessa [name] is a 62 y.o. female with extensive past medical history listed below presents to the emergency department with 8 days of rash to her back, axilla, face and groin area.  Patient reports that she has been seen by her primary care provider who do not believe that this is shingles.  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  She was prescribed topical creams but has not picked them up due to her pharmacy not having them yet.  States that these rash began after shaving.  She is endorsing associated pruritus.  No swelling.  No fevers.  No new hygiene, cosmetic products.  She denies any nausea or vomiting.  No abdominal pain.  She did endorse being constipated.  States that she is on narcotic medication for chronic pain.  HPI  Past Medical History Past Medical History:  Diagnosis Date  . Alcoholism (HCC)   . Allergic rhinitis   . Allergy   . Anxiety   . Arthritis    rheumatoid  . Asthma   . Back pain, chronic   . Chronic back pain 05/27/2012   Secondary to L4-5 herniated disc per patient  . Chronic headache   . COPD (chronic obstructive pulmonary disease) (HCC)   . Depression   . Diabetes mellitus   . Emphysema   . Emphysema of lung (HCC)   . Generalized anxiety disorder 05/26/2012  . GERD (gastroesophageal reflux disease)   . Grave's disease   . 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 Heart murmur   . Hernia, umbilical   . Herniated disc   . Herniated disc   . Hyperlipidemia   . Hypertension   . Menopause   . Neuropathy   . OSA (obstructive sleep apnea)    has a cpap  . Osteoporosis   . 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 Rheumatoid arthritis (HCC)   . Shingles   . Sleep apnea    wear c-pap  . Tear of lateral meniscus of right knee  10/13/2013  . Wears dentures    top  . Wears glasses    Patient Active Problem List   Diagnosis Date Noted  . Epistaxis, recurrent 11/12/2016  . Tobacco abuse 11/12/2016  . Pharyngeal mass 06/09/2016  . Alcohol abuse 06/09/2016  . Alcohol abuse, in remission 06/09/2016  . Atypical pneumonia 12/18/2014  . Essential hypertension 12/17/2014  . COPD exacerbation (HCC) 12/16/2014  . Tear of lateral meniscus of right knee 10/13/2013  . Chest pain 07/23/2013  . GERD (gastroesophageal reflux disease) 07/23/2013  . Morbid obesity (HCC) 07/23/2013  . Renal failure (ARF), acute on chronic (HCC) 07/23/2013  . HLD (hyperlipidemia) 07/23/2013  . Dysarthria 10/05/2012  . Diarrhea 05/27/2012  . Chronic back pain 05/27/2012  . Diabetes mellitus (HCC) 05/26/2012  . Hypothyroidism 05/26/2012  . 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 Depression 05/26/2012  . Generalized anxiety disorder 05/26/2012  . OSA (obstructive sleep apnea) 03/18/2011   Home Medication(s) Prior to Admission medications   Medication Sig Start Date End Date Taking? Authorizing Provider  albuterol (PROAIR HFA) 108 (90 BASE) MCG/ACT inhaler Inhale 2 puffs into the lungs 4 (four) times daily. For COPD    [provider]  albuterol (PROVENTIL) (2.5 MG/3ML) 0.083% nebulizer solution Take 2.5 mg by nebulization 3 (three) times  daily as needed for wheezing or shortness of breath.     [provider]  atenolol (TENORMIN) 25 MG tablet Take 25 mg by mouth daily.    [provider]  atorvastatin (LIPITOR) 40 MG tablet Take 1 tablet (40 mg total) by mouth daily. 10/06/12   Gust Rung, DO  azithromycin (ZITHROMAX) 250 MG tablet Take 1 tablet (250 mg total) by mouth daily. 04/14/17   Bethel Born, PA-C  budesonide-formoterol (SYMBICORT) 160-4.5 MCG/ACT inhaler Inhale 2 puffs into the lungs 2 (two) times daily.    [provider]  busPIRone (BUSPAR) 10 MG tablet Take 20 mg by mouth 2  (two) times daily.     [provider]  cycloSPORINE (RESTASIS) 0.05 % ophthalmic emulsion Place 1 drop into both eyes 2 (two) times daily.    [provider]  fluticasone (FLOVENT DISKUS) 50 MCG/BLIST diskus inhaler Inhale 1 puff into the lungs 2 (two) times daily.    [provider]  HYDROcodone-acetaminophen (NORCO) 10-325 MG tablet Take 0.5-1 tablets by mouth every 6 (six) hours as needed for severe pain.     [provider]  hydrOXYzine (ATARAX/VISTARIL) 25 MG tablet Take 1 tablet (25 mg total) by mouth every 6 (six) hours. 01/08/17   Janne Napoleon, NP  ipratropium-albuterol (DUONEB) 0.5-2.5 (3) MG/3ML SOLN Take 3 mLs by nebulization every 6 (six) hours as needed (for wheezing or shortness of breath).     [provider]  levothyroxine (SYNTHROID, LEVOTHROID) 175 MCG tablet Take 1 tablet (175 mcg total) by mouth every morning. 03/10/16   Audry Pili, PA-C  loratadine (CLARITIN) 10 MG tablet Take 10 mg by mouth daily.     [provider]  meloxicam (MOBIC) 15 MG tablet Take 15 mg by mouth daily.    [provider]  metFORMIN (GLUCOPHAGE) 1000 MG tablet Take 1 tablet (1,000 mg total) by mouth 2 (two) times daily. Patient taking differently: Take 500 mg by mouth daily with breakfast.  03/10/16   Audry Pili, PA-C  nystatin (MYCOSTATIN) 100000 UNIT/ML suspension Take 5 mLs by mouth 3 (three) times daily. GARGLE AND SWALLOW    [provider]  permethrin (ELIMITE) 5 % cream Apply 1 application topically once for 1 dose. Apply from neck down Leave on for 8-12hr before washing off 09/03/17 09/03/17  Cardama, Amadeo Garnet, MD  pregabalin (LYRICA) 150 MG capsule Take 150 mg by mouth 2 (two) times daily.    [provider]  QUEtiapine (SEROQUEL) 50 MG tablet Take 50 mg by mouth at bedtime.    [provider]  ranitidine (ZANTAC) 150 MG tablet Take 1 tablet (150 mg total) by mouth 2 (two) times daily. 01/08/17   Janne Napoleon, NP  sennosides-docusate sodium (SENOKOT-S) 8.6-50 MG tablet Take 2 tablets by mouth daily. Patient taking differently: Take 2 tablets by mouth daily as needed for constipation.  10/13/13   Teryl Lucy, MD  tiotropium (SPIRIVA) 18 MCG inhalation capsule Place 1 capsule (18 mcg total) into inhaler and inhale daily. 12/18/14   Valentino Nose, MD  verapamil (CALAN) 40 MG tablet Take 40 mg by mouth 3 (three) times daily.    [provider]  Past Surgical History Past Surgical History:  Procedure Laterality Date  . CHOLECYSTECTOMY    . EYE SURGERY     eye back in socker-rt  . FOOT SURGERY     cyst rt foot  . INCISION AND DRAINAGE OF PERITONSILLAR ABCESS Right 06/09/2016   Procedure: INCISION AND DRAINAGE OF PERITONSILLAR ABCESS;  Surgeon: Flo Shanks, MD;  Location: [facility] OR;  Service: ENT;  Laterality: Right;  . KNEE ARTHROSCOPY WITH LATERAL MENISECTOMY Right 10/13/2013   Procedure: RIGHT KNEE ARTHROSCOPY WITH PARTIAL LATERAL MENISECTOMY/DEBRIDEMENT/SHAVING ;  Surgeon: Eulas Post, MD;  Location:  SURGERY CENTER;  Service: Orthopedics;  Laterality: Right;  . LAPAROSCOPY     age 6  . LEFT HEART CATHETERIZATION WITH CORONARY ANGIOGRAM N/A 05/30/2012   Procedure: LEFT HEART CATHETERIZATION WITH CORONARY ANGIOGRAM;  Surgeon: [name], MD;  Location: [facility] CATH LAB;  Service: Cardiovascular;  Laterality: N/A;  . TONSILLECTOMY    . TONSILLECTOMY AND ADENOIDECTOMY Right 06/09/2016   Procedure: RIGHT TONSILECTOMY;  Surgeon: Flo Shanks, MD;  Location: [facility] OR;  Service: ENT;  Laterality: Right;  . TUBAL LIGATION     Family History Family History  Problem Relation Age of Onset  . Emphysema Mother   . Allergies Mother   . Asthma Mother   . Heart disease Mother   . Rheum arthritis Mother   . Diabetes Mother   . Kidney disease Mother     . Allergies Daughter   . Asthma Brother   . Breast cancer Other        aunt  . Lung cancer Brother   . Throat cancer Brother   . Colon cancer Neg Hx   . Colon polyps Neg Hx   . Esophageal cancer Neg Hx   . Rectal cancer Neg Hx   . Stomach cancer Neg Hx     Social History Social History   Tobacco Use  . Smoking status: Current Every Day Smoker    Packs/day: 1.00    Years: 40.00    Pack years: 40.00    Types: Cigarettes  . Smokeless tobacco: Never Used  Substance Use Topics  . Alcohol use: No    Alcohol/week: 0.0 oz  . Drug use: Yes    Types: Marijuana    Comment: opioids    Allergies Fluocinolone; Suboxone [buprenorphine hcl-naloxone hcl]; Ciprofloxacin; and Penicillins  Review of Systems Review of Systems All other systems are reviewed and are negative for acute change except as noted in the HPI  Physical Exam Vital Signs  I have reviewed the triage vital signs BP 103/85 (BP Location: Right Arm)   Pulse 79   Temp 99 F (37.2 C)   Resp 18   SpO2 99%   Physical Exam  Constitutional: She is oriented to person, place, and time. She appears well-developed and well-nourished. No distress.  HENT:  Head: Normocephalic and atraumatic.  Right Ear: External ear normal.  Left Ear: External ear normal.  Nose: Nose normal.  Eyes: Conjunctivae and EOM are normal. No scleral icterus.  Neck: Normal range of motion and phonation normal.  Cardiovascular: Normal rate and regular rhythm.  Pulmonary/Chest: Effort normal. No stridor. No respiratory distress.  Abdominal: She exhibits no distension.  Musculoskeletal: Normal range of motion. She exhibits no edema.  Neurological: She is alert and oriented to person, place, and time.  Skin: Rash noted. Rash is papular ( Small papules with overlying excoriation on upper and lower back, axilla, groin region.). She is not diaphoretic.  Psychiatric: She  has a normal mood and affect. Her behavior is normal.  Vitals reviewed.   ED  Results and Treatments Labs (all labs ordered are listed, but only abnormal results are displayed) Labs Reviewed - No data to display                                                                                                                       EKG  EKG Interpretation  Date/Time:    Ventricular Rate:    PR Interval:    QRS Duration:   QT Interval:    QTC Calculation:   R Axis:     Text Interpretation:        Radiology No results found. Pertinent labs & imaging results that were available during my care of the patient were reviewed by me and considered in my medical decision making (see chart for details).  Medications Ordered in ED Medications  hydrOXYzine (ATARAX/VISTARIL) tablet 25 mg (has no administration in time range)                                                                                                                                    Procedures Procedures  (including critical care time)  Medical Decision Making / ED Course I have reviewed the nursing notes for this encounter and the patient's prior records (if available in EHR or on provided paperwork).    Pruritic rash.  No evidence of superimposed infection.  Unable to rule out scabies.  Possible skin irritation from shaving.  Will prescribe permethrin.  Recommend she follow-up with her primary care provider  Final Clinical Impression(s) / ED Diagnoses Final diagnoses:  Rash  Itching   Disposition: Discharge  Condition: Good  I have discussed the results, Dx and Tx plan with the patient who expressed understanding and agree(s) with the plan. Discharge instructions discussed at great length. The patient was given strict return precautions who verbalized understanding of the instructions. No further questions at time of discharge.    ED Discharge Orders        Ordered    permethrin (ELIMITE) 5 % cream   Once     09/03/17 0250       Follow Up: Gilda Crease, MD [address] [place] [address] [phone]         This chart was dictated using voice recognition software.  Despite best efforts to  proofread,  errors can occur which can change the documentation meaning.   Nira Conn, MD 09/03/17 [phone]

## 2017-10-04 ENCOUNTER — Emergency Department (HOSPITAL_COMMUNITY)
Admission: EM | Admit: 2017-10-04 | Discharge: 2017-10-04 | Discharge status: Against Medical Advice | Payer: Medicaid Other | Attending: Emergency Medicine | Admitting: Emergency Medicine

## 2017-10-04 ENCOUNTER — Encounter (HOSPITAL_COMMUNITY): Payer: Self-pay | Admitting: Emergency Medicine

## 2017-10-04 DIAGNOSIS — Z79899 Other long term (current) drug therapy: Secondary | ICD-10-CM | POA: Insufficient documentation

## 2017-10-04 DIAGNOSIS — R195 Other fecal abnormalities: Secondary | ICD-10-CM | POA: Insufficient documentation

## 2017-10-04 LAB — I-STAT BETA HCG BLOOD, ED (MC, WL, AP ONLY): I-stat hCG, quantitative: <5 m[IU]/mL (ref <5)

## 2017-10-04 LAB — COMPREHENSIVE METABOLIC PANEL
ALBUMIN: 3.9 g/dL (ref 3.5–5.0)
ALT: 20 U/L (ref 0–44)
ANION GAP: 13 (ref 5–15)
AST: 23 U/L (ref 15–41)
Alkaline Phosphatase: 54 U/L (ref 38–126)
BUN: 11 mg/dL (ref 6–20)
CO2: 20 mmol/L — AB (ref 22–32)
Calcium: 9 mg/dL (ref 8.9–10.3)
Chloride: 108 mmol/L (ref 98–111)
Creatinine, Ser: 1.11 mg/dL — ABNORMAL HIGH (ref 0.44–1.00)
GFR calc Af Amer: >60 mL/min (ref >60)
GFR calc non Af Amer: 53 mL/min — ABNORMAL LOW (ref >60)
GLUCOSE: 90 mg/dL (ref 70–99)
POTASSIUM: 4.1 mmol/L (ref 3.5–5.1)
SODIUM: 141 mmol/L (ref 135–145)
TOTAL PROTEIN: 6.3 g/dL — AB (ref 6.5–8.1)
Total Bilirubin: 0.4 mg/dL (ref 0.3–1.2)

## 2017-10-04 LAB — TYPE AND SCREEN
ABO/RH(D): O POS
ANTIBODY SCREEN: NEGATIVE

## 2017-10-04 LAB — CBC
HEMATOCRIT: 38.5 % (ref 36.0–46.0)
Hemoglobin: 12.3 g/dL (ref 12.0–15.0)
MCH: 28.8 pg (ref 26.0–34.0)
MCHC: 31.9 g/dL (ref 30.0–36.0)
MCV: 90.2 fL (ref 78.0–100.0)
Platelets: 204 10*3/uL (ref 150–400)
RBC: 4.27 MIL/uL (ref 3.87–5.11)
RDW: 13.5 % (ref 11.5–15.5)
WBC: 9 10*3/uL (ref 4.0–10.5)

## 2017-10-04 LAB — ABO/RH: ABO/RH(D): O POS

## 2017-10-04 NOTE — ED Notes (Signed)
Pt states she is unable to get comfortable in chairs and feels better at this time, pt states she will f/u with her MD or return if s/s reoccur. Pt A & O, ambulated from ED WR with steady gait.

## 2017-10-04 NOTE — ED Triage Notes (Signed)
Pt presents with abd pain with bright red blood in her stool; pt having issues with pain management also

## 2017-10-08 ENCOUNTER — Encounter (HOSPITAL_COMMUNITY): Payer: Self-pay | Admitting: *Deleted

## 2017-10-08 ENCOUNTER — Emergency Department (HOSPITAL_COMMUNITY)
Admission: EM | Admit: 2017-10-08 | Discharge: 2017-10-08 | Disposition: A | Discharge status: Home / Self Care | Payer: Medicaid Other | Attending: Emergency Medicine | Admitting: Emergency Medicine

## 2017-10-08 ENCOUNTER — Other Ambulatory Visit: Payer: Self-pay

## 2017-10-08 DIAGNOSIS — E039 Hypothyroidism, unspecified: Secondary | ICD-10-CM | POA: Diagnosis not present

## 2017-10-08 DIAGNOSIS — I10 Essential (primary) hypertension: Secondary | ICD-10-CM | POA: Insufficient documentation

## 2017-10-08 DIAGNOSIS — F17210 Nicotine dependence, cigarettes, uncomplicated: Secondary | ICD-10-CM | POA: Insufficient documentation

## 2017-10-08 DIAGNOSIS — E119 Type 2 diabetes mellitus without complications: Secondary | ICD-10-CM | POA: Diagnosis not present

## 2017-10-08 DIAGNOSIS — J449 Chronic obstructive pulmonary disease, unspecified: Secondary | ICD-10-CM | POA: Insufficient documentation

## 2017-10-08 DIAGNOSIS — M549 Dorsalgia, unspecified: Secondary | ICD-10-CM | POA: Insufficient documentation

## 2017-10-08 DIAGNOSIS — Z79899 Other long term (current) drug therapy: Secondary | ICD-10-CM | POA: Diagnosis not present

## 2017-10-08 DIAGNOSIS — G8929 Other chronic pain: Secondary | ICD-10-CM | POA: Insufficient documentation

## 2017-10-08 DIAGNOSIS — M542 Cervicalgia: Secondary | ICD-10-CM | POA: Insufficient documentation

## 2017-10-08 DIAGNOSIS — Z7984 Long term (current) use of oral hypoglycemic drugs: Secondary | ICD-10-CM | POA: Diagnosis not present

## 2017-10-08 DIAGNOSIS — M436 Torticollis: Secondary | ICD-10-CM | POA: Diagnosis present

## 2017-10-08 MED ORDER — HYDROCODONE-ACETAMINOPHEN 5-325 MG PO TABS
2.0000 | ORAL_TABLET | Freq: Once | ORAL | Status: AC
  Administered 2017-10-08: 2 via ORAL
  Filled 2017-10-08: qty 2

## 2017-10-08 MED ORDER — ACETAMINOPHEN 325 MG PO TABS
325.0000 mg | ORAL_TABLET | Freq: Once | ORAL | Status: AC
  Administered 2017-10-08: 325 mg via ORAL
  Filled 2017-10-08: qty 1

## 2017-10-08 NOTE — ED Notes (Signed)
Patient able to ambulate independently with cane  

## 2017-10-08 NOTE — ED Notes (Signed)
Pt expressed discontent with wait time to see provider.  This RN explained provider is in doing a procedure.  Patient rolled eye at this RN.

## 2017-10-08 NOTE — ED Provider Notes (Signed)
[facility] EMERGENCY DEPARTMENT Provider Note   CSN: [id number] Arrival date & time: 10/08/17  2706     History   Chief Complaint Chief Complaint  Patient presents with  . Torticollis    HPI Vanessa [name] is a 62 y.o. female with a past medical history of COPD, DM, Graves' disease, reportedly recovering alcoholic, chronic narcotic user, who presents today for evaluation of chronic pain.  She reports that she has previously seen her chronic pain doctor and try to go yesterday, however they were closed.  She reports that she has been out of her Vicodin for 2 weeks and her pain is worse than usual.  She reports that she is having muscle spasms and swelling in the areas where she has muscle spasms.  She reports compliance with all of her other medications.  She denies any specific injury.  Her pain is worse in her neck and her left buttock.  She reports that she has had swelling in the back of her neck recently, she reports that normally she has a there however it appears larger than usual.  She also tells me that she thinks the swelling is from her bulging disks.  Patient is requesting pain medicine to get her by until she can see her pain doctor.  She reports "I don't do well with pain."  She also states "I need you to give me something so I don't go buy it on the street and break my 8 years of being clean."  HPI  Past Medical History:  Diagnosis Date  . Alcoholism (HCC)   . Allergic rhinitis   . Allergy   . Anxiety   . Arthritis    rheumatoid  . Asthma   . Back pain, chronic   . Chronic back pain 05/27/2012   Secondary to L4-5 herniated disc per patient  . Chronic headache   . COPD (chronic obstructive pulmonary disease) (HCC)   . Depression   . Diabetes mellitus   . Emphysema   . Emphysema of lung (HCC)   . Generalized anxiety disorder 05/26/2012  . GERD (gastroesophageal reflux disease)   . Grave's disease   . Graves' disease with exophthalmos  05/26/2012   S/p radiation ablation with iodine per patient 1982  . Heart murmur   . Hernia, umbilical   . Herniated disc   . Herniated disc   . Hyperlipidemia   . Hypertension   . Menopause   . Neuropathy   . OSA (obstructive sleep apnea)    has a cpap  . Osteoporosis   . 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 Rheumatoid arthritis (HCC)   . Shingles   . Sleep apnea    wear c-pap  . Tear of lateral meniscus of right knee 10/13/2013  . Wears dentures    top  . Wears glasses     Patient Active Problem List   Diagnosis Date Noted  . Epistaxis, recurrent 11/12/2016  . Tobacco abuse 11/12/2016  . Pharyngeal mass 06/09/2016  . Alcohol abuse 06/09/2016  . Alcohol abuse, in remission 06/09/2016  . Atypical pneumonia 12/18/2014  . Essential hypertension 12/17/2014  . COPD exacerbation (HCC) 12/16/2014  . Tear of lateral meniscus of right knee 10/13/2013  . Chest pain 07/23/2013  . GERD (gastroesophageal reflux disease) 07/23/2013  . Morbid obesity (HCC) 07/23/2013  . Renal failure (ARF), acute on chronic (HCC) 07/23/2013  . HLD (hyperlipidemia) 07/23/2013  . Dysarthria 10/05/2012  . Diarrhea 05/27/2012  . Chronic back pain 05/27/2012  .  Diabetes mellitus (HCC) 05/26/2012  . Hypothyroidism 05/26/2012  . 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 Depression 05/26/2012  . Generalized anxiety disorder 05/26/2012  . OSA (obstructive sleep apnea) 03/18/2011    Past Surgical History:  Procedure Laterality Date  . CHOLECYSTECTOMY    . EYE SURGERY     eye back in socker-rt  . FOOT SURGERY     cyst rt foot  . INCISION AND DRAINAGE OF PERITONSILLAR ABCESS Right 06/09/2016   Procedure: INCISION AND DRAINAGE OF PERITONSILLAR ABCESS;  Surgeon: Flo Shanks, MD;  Location: [facility] OR;  Service: ENT;  Laterality: Right;  . KNEE ARTHROSCOPY WITH LATERAL MENISECTOMY Right 10/13/2013   Procedure: RIGHT KNEE ARTHROSCOPY WITH PARTIAL LATERAL MENISECTOMY/DEBRIDEMENT/SHAVING ;  Surgeon: Eulas Post, MD;  Location: [facility];  Service: Orthopedics;  Laterality: Right;  . LAPAROSCOPY     age 31  . LEFT HEART CATHETERIZATION WITH CORONARY ANGIOGRAM N/A 05/30/2012   Procedure: LEFT HEART CATHETERIZATION WITH CORONARY ANGIOGRAM;  Surgeon: [name], MD;  Location: [facility] CATH LAB;  Service: Cardiovascular;  Laterality: N/A;  . TONSILLECTOMY    . TONSILLECTOMY AND ADENOIDECTOMY Right 06/09/2016   Procedure: RIGHT TONSILECTOMY;  Surgeon: Flo Shanks, MD;  Location[facility] OR;  Service: ENT;  Laterality: Right;  . TUBAL LIGATION       OB History    Gravida  5   Para  3   Term      Preterm      AB  2   Living  3     SAB  2   TAB      Ectopic      Multiple      Live Births               Home Medications    Prior to Admission medications   Medication Sig Start Date End Date Taking? Authorizing Provider  albuterol (PROAIR HFA) 108 (90 BASE) MCG/ACT inhaler Inhale 2 puffs into the lungs 4 (four) times daily. For COPD    [provider]  albuterol (PROVENTIL) (2.5 MG/3ML) 0.083% nebulizer solution Take 2.5 mg by nebulization 3 (three) times daily as needed for wheezing or shortness of breath.     [provider]  atenolol (TENORMIN) 25 MG tablet Take 25 mg by mouth daily.    [provider]  atorvastatin (LIPITOR) 40 MG tablet Take 1 tablet (40 mg total) by mouth daily. 10/06/12   Gust Rung, DO  azithromycin (ZITHROMAX) 250 MG tablet Take 1 tablet (250 mg total) by mouth daily. 04/14/17   Bethel Born, PA-C  budesonide-formoterol (SYMBICORT) 160-4.5 MCG/ACT inhaler Inhale 2 puffs into the lungs 2 (two) times daily.    [provider]  busPIRone (BUSPAR) 10 MG tablet Take 20 mg by mouth 2 (two) times daily.     [provider]  cycloSPORINE (RESTASIS) 0.05 % ophthalmic emulsion Place 1 drop into both eyes 2 (two) times daily.    [provider]  fluticasone (FLOVENT DISKUS) 50 MCG/BLIST  diskus inhaler Inhale 1 puff into the lungs 2 (two) times daily.    [provider]  HYDROcodone-acetaminophen (NORCO) 10-325 MG tablet Take 0.5-1 tablets by mouth every 6 (six) hours as needed for severe pain.     [provider]  hydrOXYzine (ATARAX/VISTARIL) 25 MG tablet Take 1 tablet (25 mg total) by mouth every 6 (six) hours. 01/08/17   Janne Napoleon, NP  ipratropium-albuterol (DUONEB) 0.5-2.5 (3) MG/3ML SOLN  Take 3 mLs by nebulization every 6 (six) hours as needed (for wheezing or shortness of breath).     [provider]  levothyroxine (SYNTHROID, LEVOTHROID) 175 MCG tablet Take 1 tablet (175 mcg total) by mouth every morning. 03/10/16   Audry Pili, PA-C  loratadine (CLARITIN) 10 MG tablet Take 10 mg by mouth daily.     [provider]  meloxicam (MOBIC) 15 MG tablet Take 15 mg by mouth daily.    [provider]  metFORMIN (GLUCOPHAGE) 1000 MG tablet Take 1 tablet (1,000 mg total) by mouth 2 (two) times daily. Patient taking differently: Take 500 mg by mouth daily with breakfast.  03/10/16   Audry Pili, PA-C  nystatin (MYCOSTATIN) 100000 UNIT/ML suspension Take 5 mLs by mouth 3 (three) times daily. GARGLE AND SWALLOW    [provider]  pregabalin (LYRICA) 150 MG capsule Take 150 mg by mouth 2 (two) times daily.    [provider]  QUEtiapine (SEROQUEL) 50 MG tablet Take 50 mg by mouth at bedtime.    [provider]  ranitidine (ZANTAC) 150 MG tablet Take 1 tablet (150 mg total) by mouth 2 (two) times daily. 01/08/17   Janne Napoleon, NP  sennosides-docusate sodium (SENOKOT-S) 8.6-50 MG tablet Take 2 tablets by mouth daily. Patient taking differently: Take 2 tablets by mouth daily as needed for constipation.  10/13/13   Teryl Lucy, MD  tiotropium (SPIRIVA) 18 MCG inhalation capsule Place 1 capsule (18 mcg total) into inhaler and inhale daily. 12/18/14   Valentino Nose, MD  verapamil (CALAN) 40 MG tablet Take 40 mg by  mouth 3 (three) times daily.    [provider]    Family History Family History  Problem Relation Age of Onset  . Emphysema Mother   . Allergies Mother   . Asthma Mother   . Heart disease Mother   . Rheum arthritis Mother   . Diabetes Mother   . Kidney disease Mother   . Allergies Daughter   . Asthma Brother   . Breast cancer Other        aunt  . Lung cancer Brother   . Throat cancer Brother   . Colon cancer Neg Hx   . Colon polyps Neg Hx   . Esophageal cancer Neg Hx   . Rectal cancer Neg Hx   . Stomach cancer Neg Hx     Social History Social History   Tobacco Use  . Smoking status: Current Every Day Smoker    Packs/day: 1.00    Years: 40.00    Pack years: 40.00    Types: Cigarettes  . Smokeless tobacco: Never Used  Substance Use Topics  . Alcohol use: No    Alcohol/week: 0.0 oz  . Drug use: Yes    Types: Marijuana    Comment: opioids      Allergies   Fluocinolone; Suboxone [buprenorphine hcl-naloxone hcl]; Ciprofloxacin; and Penicillins   Review of Systems Review of Systems  Constitutional: Negative for chills and fever.  Musculoskeletal: Positive for myalgias and neck pain.  All other systems reviewed and are negative.    Physical Exam Updated Vital Signs BP 118/78 (BP Location: Left Arm)   Pulse 66   Temp 98.3 F (36.8 C) (Oral)   Resp 16   Ht 5\' 8"  (1.727 m)   Wt 98.4 kg (217 lb)   SpO2 100%   BMI 32.99 kg/m   Physical Exam  Constitutional: She appears well-developed and well-nourished. No distress.  Eyes:  Conjunctivae are normal.  Neck: Normal range of motion. Neck supple.  Mild soft tissue swelling over posterior neck with out fluctuance, redness, drainage or induration, question if normal body habitus for patient.    Cardiovascular: Normal rate.  Pulmonary/Chest: No respiratory distress.  Musculoskeletal:  Palpation over area of left buttock reported swelling with no deformities, no knots.  No abnormal fluctuance.   Palpation over cervical lateral paraspinal muscles recreates patient's neck pain.  No midline TTP.  Gait is normal.   Neurological: She is alert.  Skin: She is not diaphoretic.  Skin over area of left buttock where patient reports a normal does not have any abnormal erythema, fluctuance, or induration.  Psychiatric: Her speech is tangential. She is inattentive.  Nursing note and vitals reviewed.    ED Treatments / Results  Labs (all labs ordered are listed, but only abnormal results are displayed) Labs Reviewed - No data to display  EKG None  Radiology No results found.  Procedures Procedures (including critical care time)  Medications Ordered in ED Medications  HYDROcodone-acetaminophen (NORCO/VICODIN) 5-325 MG per tablet 2 tablet (has no administration in time range)  acetaminophen (TYLENOL) tablet 325 mg (has no administration in time range)     Initial Impression / Assessment and Plan / ED Course  I have reviewed the triage vital signs and the nursing notes.  Pertinent labs & imaging results that were available during my care of the patient were reviewed by me and considered in my medical decision making (see chart for details).    Patient presents today requesting pain medicine for her chronic pain.  She states to me that if I do not give her prescriptions for pain medicine at home and she will go buy it on the street instead.  Physical exam does not reveal any acute condition that would require narcotic pain medicine.  Patient has multiple vague complaints, however is unable to fully focus on any specific complaint.  She declined any new injury or symptom, stating that these are all her chronic pains just worse.  Will give patient a one-time dose of Vicodin here along with Tylenol to help her pain.  Informed patient that if she has additional concerns she needs to seek additional medical care.  Return precautions discussed, PCP and pain clinic follow-up.  Discharge  home.   Final Clinical Impressions(s) / ED Diagnoses   Final diagnoses:  Other chronic pain  Chronic back pain, unspecified back location, unspecified back pain laterality    ED Discharge Orders    None       Norman Clay 10/08/17 2055    Blane Ohara, MD 10/08/17 [phone]

## 2017-10-08 NOTE — Discharge Instructions (Signed)
Today you received medications that may make you sleepy or impair your ability to make decisions.  For the next 24 hours please do not drive, operate heavy machinery, care for a small child with out another adult present, or perform any activities that may cause harm to you or someone else if you were to fall asleep or be impaired.   If you have other concerns or wish for additional work up please seek additional medical care.

## 2017-10-08 NOTE — ED Notes (Signed)
PA at bedside.

## 2017-10-08 NOTE — ED Triage Notes (Signed)
The pt is c/o neck pain and swelling in her rt buttocks for several days  She has chronic pain

## 2017-10-30 ENCOUNTER — Emergency Department (HOSPITAL_COMMUNITY)
Admission: EM | Admit: 2017-10-30 | Discharge: 2017-10-30 | Disposition: A | Discharge status: Home / Self Care | Payer: Medicaid Other | Attending: Emergency Medicine | Admitting: Emergency Medicine

## 2017-10-30 ENCOUNTER — Encounter (HOSPITAL_COMMUNITY): Payer: Self-pay | Admitting: Emergency Medicine

## 2017-10-30 ENCOUNTER — Other Ambulatory Visit: Payer: Self-pay

## 2017-10-30 ENCOUNTER — Emergency Department (HOSPITAL_COMMUNITY): Payer: Medicaid Other

## 2017-10-30 DIAGNOSIS — I10 Essential (primary) hypertension: Secondary | ICD-10-CM | POA: Diagnosis not present

## 2017-10-30 DIAGNOSIS — R21 Rash and other nonspecific skin eruption: Secondary | ICD-10-CM | POA: Diagnosis not present

## 2017-10-30 DIAGNOSIS — M62830 Muscle spasm of back: Secondary | ICD-10-CM | POA: Diagnosis not present

## 2017-10-30 DIAGNOSIS — F17210 Nicotine dependence, cigarettes, uncomplicated: Secondary | ICD-10-CM | POA: Diagnosis not present

## 2017-10-30 DIAGNOSIS — Z7984 Long term (current) use of oral hypoglycemic drugs: Secondary | ICD-10-CM | POA: Insufficient documentation

## 2017-10-30 DIAGNOSIS — E1140 Type 2 diabetes mellitus with diabetic neuropathy, unspecified: Secondary | ICD-10-CM | POA: Insufficient documentation

## 2017-10-30 DIAGNOSIS — E039 Hypothyroidism, unspecified: Secondary | ICD-10-CM | POA: Diagnosis not present

## 2017-10-30 DIAGNOSIS — J449 Chronic obstructive pulmonary disease, unspecified: Secondary | ICD-10-CM | POA: Insufficient documentation

## 2017-10-30 DIAGNOSIS — Z79899 Other long term (current) drug therapy: Secondary | ICD-10-CM | POA: Insufficient documentation

## 2017-10-30 DIAGNOSIS — M25552 Pain in left hip: Secondary | ICD-10-CM | POA: Insufficient documentation

## 2017-10-30 MED ORDER — HYDROCODONE-ACETAMINOPHEN 5-325 MG PO TABS
1.0000 | ORAL_TABLET | Freq: Once | ORAL | Status: AC
  Administered 2017-10-30: 1 via ORAL
  Filled 2017-10-30: qty 1

## 2017-10-30 MED ORDER — CYCLOBENZAPRINE HCL 10 MG PO TABS: 10.0000 mg | ORAL_TABLET | Freq: Two times a day (BID) | ORAL | 0 refills | Status: DC | PRN

## 2017-10-30 MED ORDER — CYCLOBENZAPRINE HCL 10 MG PO TABS
10.0000 mg | ORAL_TABLET | Freq: Once | ORAL | Status: AC
  Administered 2017-10-30: 10 mg via ORAL
  Filled 2017-10-30: qty 1

## 2017-10-30 MED ORDER — HYDROXYZINE HCL 25 MG PO TABS
25.0000 mg | ORAL_TABLET | Freq: Once | ORAL | Status: AC
  Administered 2017-10-30: 25 mg via ORAL
  Filled 2017-10-30: qty 1

## 2017-10-30 MED ORDER — HYDROXYZINE HCL 25 MG PO TABS
25.0000 mg | ORAL_TABLET | Freq: Four times a day (QID) | ORAL | 0 refills | Status: AC
Start: 2017-10-30 — End: ?

## 2017-10-30 NOTE — ED Provider Notes (Signed)
  Physical Exam  BP 130/67 (BP Location: Right Arm)   Pulse 64   Temp 98.9 F (37.2 C) (Oral)   Resp 16   Ht 5\' 8"  (1.727 m)   Wt 98.4 kg (217 lb)   SpO2 97%   BMI 32.99 kg/m   Physical Exam  Musculoskeletal:       Left hip: She exhibits tenderness. She exhibits normal range of motion, normal strength and no bony tenderness.  Able to bear full weight and ambulate without issue.  Neurological:  Reflex Scores:      Patellar reflexes are 2+ on the right side and 2+ on the left side.      Achilles reflexes are 2+ on the right side and 2+ on the left side.   ED Course/Procedures   Clinical Course as of Oct 31 2006  Sat Oct 30, 2017  1729 Case discussed with 1730, NP at shift change. Per patient request, hip x-ray has been ordered for further evaluation. If x-ray returns normal, patient can be safely discharged.   [GM]  1843 Normal hip x-ray. No fractures or dislocations.   [GM]    Clinical Course User Index [GM] Acea Yagi, Kerrie Buffalo, PA-C    Procedures  MDM  Dispo: Home. After thorough clinical evaluation, this patient is determined to be medically stable and can be safely discharged with the previously mentioned treatment and/or outpatient follow-up/referral(s). At this time, there are no other apparent medical conditions that require further screening, evaluation or treatment.     Sharyon Medicus 10/30/17 12/30/17, MD 10/31/17 12/31/17

## 2017-10-30 NOTE — Discharge Instructions (Addendum)
X-ray of your hip was normal. Nothing is fractured or dislocated.  I am treating your for your muscle strain and for your itching. Follow up with your doctor for continued pain management and general health care. Your prescriptions have been sent to the [facility] on [address]

## 2017-10-30 NOTE — ED Triage Notes (Addendum)
Pt c/o hip pain - left -- hurt while twisting trying to get into a car. Ran out of hydrocodone on Friday. Now has shingles outbreak too.  Pt tearful during triage assessment- states she is tired, "I am tired of hurting and being sick. I am tired of everyone thinking I am just looking for drugs and I am not, I hurt everywhere"  C/o having hives and red skin- has one blister on tailbone, no shingles noted

## 2017-10-30 NOTE — ED Provider Notes (Addendum)
[facility] EMERGENCY DEPARTMENT Provider Note   CSN: [id number] Arrival date & time: 10/30/17  1356     History   Chief Complaint Chief Complaint  Patient presents with  . Hip Pain    HPI Vanessa [name] is a 62 y.o. female with hx of multiple medical problems as listed below in PMH who presents to the ED with hip pain. The pain is located in the left hip. Patient reports she injured it while she was twisting to try and get into a car several days ago.  Patient with hx of chronic pain and ran out of her hydrocodone yesterday.  Patient also reports she saw her doctor a few days ago for hives and itching. The hives have gone but the patient reports that she has an area of shingles near her tail bone that itches. She takes Valtrex every day but still has the rash.   HPI  Past Medical History:  Diagnosis Date  . Alcoholism (HCC)   . Allergic rhinitis   . Allergy   . Anxiety   . Arthritis    rheumatoid  . Asthma   . Back pain, chronic   . Chronic back pain 05/27/2012   Secondary to L4-5 herniated disc per patient  . Chronic headache   . COPD (chronic obstructive pulmonary disease) (HCC)   . Depression   . Diabetes mellitus   . Emphysema   . Emphysema of lung (HCC)   . Generalized anxiety disorder 05/26/2012  . GERD (gastroesophageal reflux disease)   . Grave's disease   . 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 Heart murmur   . Hernia, umbilical   . Herniated disc   . Herniated disc   . Hyperlipidemia   . Hypertension   . Menopause   . Neuropathy   . OSA (obstructive sleep apnea)    has a cpap  . Osteoporosis   . 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 Rheumatoid arthritis (HCC)   . Shingles   . Sleep apnea    wear c-pap  . Tear of lateral meniscus of right knee 10/13/2013  . Wears dentures    top  . Wears glasses     Patient Active Problem List   Diagnosis Date Noted  . Epistaxis,  recurrent 11/12/2016  . Tobacco abuse 11/12/2016  . Pharyngeal mass 06/09/2016  . Alcohol abuse 06/09/2016  . Alcohol abuse, in remission 06/09/2016  . Atypical pneumonia 12/18/2014  . Essential hypertension 12/17/2014  . COPD exacerbation (HCC) 12/16/2014  . Tear of lateral meniscus of right knee 10/13/2013  . Chest pain 07/23/2013  . GERD (gastroesophageal reflux disease) 07/23/2013  . Morbid obesity (HCC) 07/23/2013  . Renal failure (ARF), acute on chronic (HCC) 07/23/2013  . HLD (hyperlipidemia) 07/23/2013  . Dysarthria 10/05/2012  . Diarrhea 05/27/2012  . Chronic back pain 05/27/2012  . Diabetes mellitus (HCC) 05/26/2012  . Hypothyroidism 05/26/2012  . 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 Depression 05/26/2012  . Generalized anxiety disorder 05/26/2012  . OSA (obstructive sleep apnea) 03/18/2011    Past Surgical History:  Procedure Laterality Date  . CHOLECYSTECTOMY    . EYE SURGERY     eye back in socker-rt  . FOOT SURGERY     cyst rt foot  . INCISION AND DRAINAGE OF PERITONSILLAR ABCESS Right 06/09/2016   Procedure: INCISION AND DRAINAGE OF PERITONSILLAR ABCESS;  Surgeon: Flo Shanks, MD;  Location: [facility] OR;  Service: ENT;  Laterality: Right;  . KNEE ARTHROSCOPY WITH LATERAL MENISECTOMY Right 10/13/2013   Procedure: RIGHT KNEE ARTHROSCOPY WITH PARTIAL LATERAL MENISECTOMY/DEBRIDEMENT/SHAVING ;  Surgeon: Eulas Post, MD;  Location: [facility];  Service: Orthopedics;  Laterality: Right;  . LAPAROSCOPY     age 65  . LEFT HEART CATHETERIZATION WITH CORONARY ANGIOGRAM N/A 05/30/2012   Procedure: LEFT HEART CATHETERIZATION WITH CORONARY ANGIOGRAM;  Surgeon: [name], MD;  Location[facility] C[facility] LAB;  Service: Cardiovascular;  Laterality: N/A;  . TONSILLECTOMY    . TONSILLECTOMY AND ADENOIDECTOMY Right 06/09/2016   Procedure: RIGHT TONSILECTOMY;  Surgeon: Flo Shanks, MD;  Location: [facility] OR;  Service: ENT;  Laterality: Right;  . TUBAL LIGATION         OB History    Gravida  5   Para  3   Term      Preterm      AB  2   Living  3     SAB  2   TAB      Ectopic      Multiple      Live Births               Home Medications    Prior to Admission medications   Medication Sig Start Date End Date Taking? Authorizing Provider  albuterol (PROAIR HFA) 108 (90 BASE) MCG/ACT inhaler Inhale 2 puffs into the lungs 4 (four) times daily. For COPD    [provider]  albuterol (PROVENTIL) (2.5 MG/3ML) 0.083% nebulizer solution Take 2.5 mg by nebulization 3 (three) times daily as needed for wheezing or shortness of breath.     [provider]  atenolol (TENORMIN) 25 MG tablet Take 25 mg by mouth daily.    [provider]  atorvastatin (LIPITOR) 40 MG tablet Take 1 tablet (40 mg total) by mouth daily. 10/06/12   Gust Rung, DO  azithromycin (ZITHROMAX) 250 MG tablet Take 1 tablet (250 mg total) by mouth daily. 04/14/17   Bethel Born, PA-C  budesonide-formoterol (SYMBICORT) 160-4.5 MCG/ACT inhaler Inhale 2 puffs into the lungs 2 (two) times daily.    [provider]  busPIRone (BUSPAR) 10 MG tablet Take 20 mg by mouth 2 (two) times daily.     [provider]  cyclobenzaprine (FLEXERIL) 10 MG tablet Take 1 tablet (10 mg total) by mouth 2 (two) times daily as needed for muscle spasms. 10/30/17   Janne Napoleon, NP  cycloSPORINE (RESTASIS) 0.05 % ophthalmic emulsion Place 1 drop into both eyes 2 (two) times daily.    [provider]  fluticasone (FLOVENT DISKUS) 50 MCG/BLIST diskus inhaler Inhale 1 puff into the lungs 2 (two) times daily.    [provider]  HYDROcodone-acetaminophen (NORCO) 10-325 MG tablet Take 0.5-1 tablets by mouth every 6 (six) hours as needed for severe pain.     [provider]  hydrOXYzine (ATARAX/VISTARIL) 25 MG tablet Take 1 tablet (25 mg total) by mouth every 6 (six) hours. 10/30/17   Janne Napoleon, NP  ipratropium-albuterol  (DUONEB) 0.5-2.5 (3) MG/3ML SOLN Take 3 mLs by nebulization every 6 (six) hours as needed (for wheezing or shortness of breath).     [provider]  levothyroxine (SYNTHROID, LEVOTHROID) 175 MCG tablet Take 1 tablet (175 mcg total) by mouth every morning. 03/10/16   Audry Pili, PA-C  loratadine (CLARITIN) 10 MG tablet Take 10 mg by mouth daily.     [provider]  meloxicam (MOBIC) 15 MG  tablet Take 15 mg by mouth daily.    [provider]  metFORMIN (GLUCOPHAGE) 1000 MG tablet Take 1 tablet (1,000 mg total) by mouth 2 (two) times daily. Patient taking differently: Take 500 mg by mouth daily with breakfast.  03/10/16   Audry Pili, PA-C  nystatin (MYCOSTATIN) 100000 UNIT/ML suspension Take 5 mLs by mouth 3 (three) times daily. GARGLE AND SWALLOW    [provider]  pregabalin (LYRICA) 150 MG capsule Take 150 mg by mouth 2 (two) times daily.    [provider]  QUEtiapine (SEROQUEL) 50 MG tablet Take 50 mg by mouth at bedtime.    [provider]  ranitidine (ZANTAC) 150 MG tablet Take 1 tablet (150 mg total) by mouth 2 (two) times daily. 01/08/17   Janne Napoleon, NP  sennosides-docusate sodium (SENOKOT-S) 8.6-50 MG tablet Take 2 tablets by mouth daily. Patient taking differently: Take 2 tablets by mouth daily as needed for constipation.  10/13/13   Teryl Lucy, MD  tiotropium (SPIRIVA) 18 MCG inhalation capsule Place 1 capsule (18 mcg total) into inhaler and inhale daily. 12/18/14   Valentino Nose, MD  verapamil (CALAN) 40 MG tablet Take 40 mg by mouth 3 (three) times daily.    [provider]    Family History Family History  Problem Relation Age of Onset  . Emphysema Mother   . Allergies Mother   . Asthma Mother   . Heart disease Mother   . Rheum arthritis Mother   . Diabetes Mother   . Kidney disease Mother   . Allergies Daughter   . Asthma Brother   . Breast cancer Other        aunt  . Lung cancer Brother   . Throat  cancer Brother   . Colon cancer Neg Hx   . Colon polyps Neg Hx   . Esophageal cancer Neg Hx   . Rectal cancer Neg Hx   . Stomach cancer Neg Hx     Social History Social History   Tobacco Use  . Smoking status: Current Every Day Smoker    Packs/day: 1.00    Years: 40.00    Pack years: 40.00    Types: Cigarettes  . Smokeless tobacco: Never Used  Substance Use Topics  . Alcohol use: No    Alcohol/week: 0.0 oz  . Drug use: Yes    Types: Marijuana    Comment: opioids      Allergies   Fluocinolone; Suboxone [buprenorphine hcl-naloxone hcl]; Ciprofloxacin; and Penicillins   Review of Systems Review of Systems  Musculoskeletal: Positive for arthralgias.       Left hip pain  Skin: Positive for rash.  All other systems reviewed and are negative.    Physical Exam Updated Vital Signs BP 125/80 (BP Location: Right Arm)   Pulse 78   Temp 98.7 F (37.1 C) (Oral)   Resp 14   Ht 5\' 8"  (1.727 m)   Wt 98.4 kg (217 lb)   SpO2 98%   BMI 32.99 kg/m   Physical Exam  Constitutional: She appears well-developed and well-nourished. No distress.  HENT:  Head: Normocephalic.  Eyes: Conjunctivae and EOM are normal.  Neck: Normal range of motion. Neck supple.  Cardiovascular: Normal rate.  Pulmonary/Chest: Effort normal.  Abdominal: Soft. There is no tenderness.  Musculoskeletal:       Left hip: She exhibits normal range of motion, normal strength and no deformity.  Tender with palpation to the left buttock that radiates to the left  inguinal area. No bony tenderness.   Neurological: She is alert.  Skin: Skin is warm and dry. Rash noted.  There is a blister area to the left buttock.  Psychiatric: She has a normal mood and affect.  Nursing note and vitals reviewed.  When I ask the patient to stand and walk she gets out of the chair putting her weight on the affected leg first without difficulty.  ED Treatments / Results  Labs (all labs ordered are listed, but only abnormal  results are displayed) Labs Reviewed - No data to display  Radiology No results found.  Procedures Procedures (including critical care time)  Medications Ordered in ED Medications  HYDROcodone-acetaminophen (NORCO/VICODIN) 5-325 MG per tablet 1 tablet (1 tablet Oral Given 10/30/17 1652)  hydrOXYzine (ATARAX/VISTARIL) tablet 25 mg (25 mg Oral Given 10/30/17 1652)  cyclobenzaprine (FLEXERIL) tablet 10 mg (10 mg Oral Given 10/30/17 1652)     Initial Impression / Assessment and Plan / ED Course  I have reviewed the triage vital signs and the nursing notes. 62 y.o. female with left lower back and buttock pain stable for d/c without neuro deficits. Will treat with muscle relaxer and patient to f/u with PCP. Will give atarax for itching.  Final Clinical Impressions(s) / ED Diagnoses   Final diagnoses:  Spasm of muscle of lower back  Rash    ED Discharge Orders        Ordered    cyclobenzaprine (FLEXERIL) 10 MG tablet  2 times daily PRN     10/30/17 1723    hydrOXYzine (ATARAX/VISTARIL) 25 MG tablet  Every 6 hours     10/30/17 1723       Kerrie Buffalo [place], [place] 10/30/17 1724 Patient was ready for d/c and RN was going over instructions. When patient informed of the medications she was prescribed and that she would not be going home with percocet she began crying and stated we had done nothing for her and she does not go back to her pain doctor for 4 days and she needs an x-ray of her hip and Percocet until she can follow up. Discussed with the patient that we do not prescribe Percocet for muscle strain and we do not treat chronic pain in the ED. Will x-ray left hip.  Care turned over to G. Mortis, PAC @ 6:00 pm   Kerrie Buffalo [place], [place] 10/30/17 1803    Raeford Razor, MD 10/31/17 [phone]

## 2017-12-08 ENCOUNTER — Other Ambulatory Visit: Payer: Self-pay | Admitting: Orthopedic Surgery

## 2017-12-08 DIAGNOSIS — M7121 Synovial cyst of popliteal space [Baker], right knee: Secondary | ICD-10-CM

## 2017-12-15 ENCOUNTER — Ambulatory Visit
Admission: RE | Admit: 2017-12-15 | Discharge: 2017-12-15 | Disposition: A | Discharge status: Home / Self Care | Payer: Medicaid Other | Source: Ambulatory Visit | Attending: Orthopedic Surgery | Admitting: Orthopedic Surgery

## 2017-12-15 DIAGNOSIS — M7121 Synovial cyst of popliteal space [Baker], right knee: Secondary | ICD-10-CM

## 2017-12-15 NOTE — Procedures (Signed)
Interventional Radiology Procedure Note  Procedure: US guided aspirate and treatment of right bakers cyst.  4cc bupivicaine, 80mg  depot.  .  Complications: None  Recommendations:  - Ok to shower tomorrow - Do not submerge for 7 days - Routine care   Signed,  . Yvone Neu, DO

## 2018-01-12 ENCOUNTER — Ambulatory Visit (HOSPITAL_COMMUNITY)
Admission: EM | Admit: 2018-01-12 | Discharge: 2018-01-12 | Disposition: A | Discharge status: Home / Self Care | Payer: Medicaid Other | Attending: Family Medicine | Admitting: Family Medicine

## 2018-01-12 ENCOUNTER — Encounter (HOSPITAL_COMMUNITY): Payer: Self-pay | Admitting: Emergency Medicine

## 2018-01-12 DIAGNOSIS — B002 Herpesviral gingivostomatitis and pharyngotonsillitis: Secondary | ICD-10-CM

## 2018-01-12 MED ORDER — MAGIC MOUTHWASH W/LIDOCAINE: 5.0000 mL | Freq: Four times a day (QID) | ORAL | 0 refills | Status: DC | PRN

## 2018-01-12 NOTE — ED Provider Notes (Signed)
[facility]    CSN: [id number] Arrival date & time: 01/12/18  1858     History   Chief Complaint Chief Complaint  Patient presents with  . Rash    HPI Vanessa [name] is a 62 y.o. female.   Patient has history of herpes infections on her mouth as well as other parts of her body.  She has a standing prescription for [facility] and takes it on a regular basis to prevent.  She has been taking it twice a day since the breakout.  She describes complains of burning in her mouth.  She does have lower denture.  HPI  Past Medical History:  Diagnosis Date  . Alcoholism (HCC)   . Allergic rhinitis   . Allergy   . Anxiety   . Arthritis    rheumatoid  . Asthma   . Back pain, chronic   . Chronic back pain 05/27/2012   Secondary to L4-5 herniated disc per patient  . Chronic headache   . COPD (chronic obstructive pulmonary disease) (HCC)   . Depression   . Diabetes mellitus   . Emphysema   . Emphysema of lung (HCC)   . Generalized anxiety disorder 05/26/2012  . GERD (gastroesophageal reflux disease)   . Grave's disease   . 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 Heart murmur   . Hernia, umbilical   . Herniated disc   . Herniated disc   . Hyperlipidemia   . Hypertension   . Menopause   . Neuropathy   . OSA (obstructive sleep apnea)    has a cpap  . Osteoporosis   . 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 Rheumatoid arthritis (HCC)   . Shingles   . Sleep apnea    wear c-pap  . Tear of lateral meniscus of right knee 10/13/2013  . Wears dentures    top  . Wears glasses     Patient Active Problem List   Diagnosis Date Noted  . Epistaxis, recurrent 11/12/2016  . Tobacco abuse 11/12/2016  . Pharyngeal mass 06/09/2016  . Alcohol abuse 06/09/2016  . Alcohol abuse, in remission 06/09/2016  . Atypical pneumonia 12/18/2014  . Essential hypertension 12/17/2014  . COPD exacerbation (HCC) 12/16/2014  . Tear of  lateral meniscus of right knee 10/13/2013  . Chest pain 07/23/2013  . GERD (gastroesophageal reflux disease) 07/23/2013  . Morbid obesity (HCC) 07/23/2013  . Renal failure (ARF), acute on chronic (HCC) 07/23/2013  . HLD (hyperlipidemia) 07/23/2013  . Dysarthria 10/05/2012  . Diarrhea 05/27/2012  . Chronic back pain 05/27/2012  . Diabetes mellitus (HCC) 05/26/2012  . Hypothyroidism 05/26/2012  . 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 Depression 05/26/2012  . Generalized anxiety disorder 05/26/2012  . OSA (obstructive sleep apnea) 03/18/2011    Past Surgical History:  Procedure Laterality Date  . CHOLECYSTECTOMY    . EYE SURGERY     eye back in socker-rt  . FOOT SURGERY     cyst rt foot  . INCISION AND DRAINAGE OF PERITONSILLAR ABCESS Right 06/09/2016   Procedure: INCISION AND DRAINAGE OF PERITONSILLAR ABCESS;  Surgeon: Flo Shanks, MD;  Location: [facility];  Service: ENT;  Laterality: Right;  . KNEE ARTHROSCOPY WITH LATERAL MENISECTOMY Right 10/13/2013   Procedure: RIGHT KNEE ARTHROSCOPY WITH PARTIAL LATERAL MENISECTOMY/DEBRIDEMENT/SHAVING ;  Surgeon: Eulas Post, MD;  Location: [facility];  Service: Orthopedics;  Laterality: Right;  . LAPAROSCOPY     age  41  . LEFT HEART CATHETERIZATION WITH CORONARY ANGIOGRAM N/A 05/30/2012   Procedure: LEFT HEART CATHETERIZATION WITH CORONARY ANGIOGRAM;  Surgeon: [name], MD;  Location: [facility] CATH LAB;  Service: Cardiovascular;  Laterality: N/A;  . TONSILLECTOMY    . TONSILLECTOMY AND ADENOIDECTOMY Right 06/09/2016   Procedure: RIGHT TONSILECTOMY;  Surgeon: Flo Shanks, MD;  Location: [facility] OR;  Service: ENT;  Laterality: Right;  . TUBAL LIGATION      OB History    Gravida  5   Para  3   Term      Preterm      AB  2   Living  3     SAB  2   TAB      Ectopic      Multiple      Live Births               Home Medications    Prior to Admission medications   Medication Sig Start Date End  Date Taking? Authorizing Provider  famciclovir (FAMVIR) 500 MG tablet Take by mouth 3 (three) times daily.   Yes [provider]  albuterol (PROAIR HFA) 108 (90 BASE) MCG/ACT inhaler Inhale 2 puffs into the lungs 4 (four) times daily. For COPD    [provider]  albuterol (PROVENTIL) (2.5 MG/3ML) 0.083% nebulizer solution Take 2.5 mg by nebulization 3 (three) times daily as needed for wheezing or shortness of breath.     [provider]  atenolol (TENORMIN) 25 MG tablet Take 25 mg by mouth daily.    [provider]  atorvastatin (LIPITOR) 40 MG tablet Take 1 tablet (40 mg total) by mouth daily. 10/06/12   Gust Rung, DO  azithromycin (ZITHROMAX) 250 MG tablet Take 1 tablet (250 mg total) by mouth daily. 04/14/17   Bethel Born, PA-C  budesonide-formoterol (SYMBICORT) 160-4.5 MCG/ACT inhaler Inhale 2 puffs into the lungs 2 (two) times daily.    [provider]  busPIRone (BUSPAR) 10 MG tablet Take 20 mg by mouth 2 (two) times daily.     [provider]  cyclobenzaprine (FLEXERIL) 10 MG tablet Take 1 tablet (10 mg total) by mouth 2 (two) times daily as needed for muscle spasms. 10/30/17   Janne Napoleon, NP  cycloSPORINE (RESTASIS) 0.05 % ophthalmic emulsion Place 1 drop into both eyes 2 (two) times daily.    [provider]  fluticasone (FLOVENT DISKUS) 50 MCG/BLIST diskus inhaler Inhale 1 puff into the lungs 2 (two) times daily.    [provider]  HYDROcodone-acetaminophen (NORCO) 10-325 MG tablet Take 0.5-1 tablets by mouth every 6 (six) hours as needed for severe pain.     [provider]  hydrOXYzine (ATARAX/VISTARIL) 25 MG tablet Take 1 tablet (25 mg total) by mouth every 6 (six) hours. 10/30/17   Janne Napoleon, NP  ipratropium-albuterol (DUONEB) 0.5-2.5 (3) MG/3ML SOLN Take 3 mLs by nebulization every 6 (six) hours as needed (for wheezing or shortness of breath).     [provider]  levothyroxine  (SYNTHROID, LEVOTHROID) 175 MCG tablet Take 1 tablet (175 mcg total) by mouth every morning. 03/10/16   Audry Pili, PA-C  loratadine (CLARITIN) 10 MG tablet Take 10 mg by mouth daily.     [provider]  magic mouthwash w/lidocaine SOLN Take 5 mLs by mouth 4 (four) times daily as needed for mouth pain. 01/12/18   Frederica Kuster, MD  meloxicam (MOBIC) 15 MG tablet Take 15 mg by  mouth daily.    [provider]  metFORMIN (GLUCOPHAGE) 1000 MG tablet Take 1 tablet (1,000 mg total) by mouth 2 (two) times daily. Patient taking differently: Take 500 mg by mouth daily with breakfast.  03/10/16   Audry Pili, PA-C  nystatin (MYCOSTATIN) 100000 UNIT/ML suspension Take 5 mLs by mouth 3 (three) times daily. GARGLE AND SWALLOW    [provider]  pregabalin (LYRICA) 150 MG capsule Take 150 mg by mouth 2 (two) times daily.    [provider]  QUEtiapine (SEROQUEL) 50 MG tablet Take 50 mg by mouth at bedtime.    [provider]  ranitidine (ZANTAC) 150 MG tablet Take 1 tablet (150 mg total) by mouth 2 (two) times daily. 01/08/17   Janne Napoleon, NP  sennosides-docusate sodium (SENOKOT-S) 8.6-50 MG tablet Take 2 tablets by mouth daily. Patient taking differently: Take 2 tablets by mouth daily as needed for constipation.  10/13/13   Teryl Lucy, MD  tiotropium (SPIRIVA) 18 MCG inhalation capsule Place 1 capsule (18 mcg total) into inhaler and inhale daily. 12/18/14   Valentino Nose, MD  verapamil (CALAN) 40 MG tablet Take 40 mg by mouth 3 (three) times daily.    [provider]    Family History Family History  Problem Relation Age of Onset  . Emphysema Mother   . Allergies Mother   . Asthma Mother   . Heart disease Mother   . Rheum arthritis Mother   . Diabetes Mother   . Kidney disease Mother   . Allergies Daughter   . Asthma Brother   . Breast cancer Other        aunt  . Lung cancer Brother   . Throat cancer Brother   . Colon cancer Neg  Hx   . Colon polyps Neg Hx   . Esophageal cancer Neg Hx   . Rectal cancer Neg Hx   . Stomach cancer Neg Hx     Social History Social History   Tobacco Use  . Smoking status: Current Every Day Smoker    Packs/day: 1.00    Years: 40.00    Pack years: 40.00    Types: Cigarettes  . Smokeless tobacco: Never Used  Substance Use Topics  . Alcohol use: No    Alcohol/week: 0.0 standard drinks  . Drug use: Yes    Types: Marijuana    Comment: opioids      Allergies   Fluocinolone; Suboxone [buprenorphine hcl-naloxone hcl]; Ciprofloxacin; and Penicillins   Review of Systems Review of Systems  HENT: Positive for mouth sores and trouble swallowing.   Respiratory: Negative.   Cardiovascular: Negative.   Gastrointestinal: Negative.   Psychiatric/Behavioral: Negative.      Physical Exam Triage Vital Signs ED Triage Vitals [01/12/18 1930]  Enc Vitals Group     BP 129/68     Pulse Rate 96     Resp 18     Temp 99.3 F (37.4 C)     Temp src      SpO2 100 %     Weight      Height      Head Circumference      Peak Flow      Pain Score 8     Pain Loc      Pain Edu?      Excl. in GC?    No data found.  Updated Vital Signs BP 129/68   Pulse 96   Temp 99.3 F (37.4 C)   Resp  18   SpO2 100%   Visual Acuity Right Eye Distance:   Left Eye Distance:   Bilateral Distance:    Right Eye Near:   Left Eye Near:    Bilateral Near:     Physical Exam  Constitutional: She is oriented to person, place, and time. She appears well-developed and well-nourished.  HENT:  There are sores along her lower lip and erythema the vehicle mucosa but no obvious sores present there  Cardiovascular: Normal rate and regular rhythm.  Pulmonary/Chest: Effort normal and breath sounds normal.  Neurological: She is alert and oriented to person, place, and time.  Skin: Skin is warm and dry.     UC Treatments / Results  Labs (all labs ordered are listed, but only abnormal results are  displayed) Labs Reviewed - No data to display  EKG None  Radiology No results found.  Procedures Procedures (including critical care time)  Medications Ordered in UC Medications - No data to display  Initial Impression / Assessment and Plan / UC Course  I have reviewed the triage vital signs and the nursing notes.  Pertinent labs & imaging results that were available during my care of the patient were reviewed by me and considered in my medical decision making (see chart for details).     Herpes stomatitis.  Continue famciclovir 500 mg 3 times daily.  Will prescribe Magic mouthwash with lidocaine to swish and gargle for symptomatic relief of mouth discomfort Final Clinical Impressions(s) / UC Diagnoses   Final diagnoses:  Herpes stomatitis   Discharge Instructions   None    ED Prescriptions    Medication Sig Dispense Auth. Provider   magic mouthwash w/lidocaine SOLN Take 5 mLs by mouth 4 (four) times daily as needed for mouth pain. 60 mL Frederica Kuster, MD     Controlled Substance Prescriptions [place] Controlled Substance Registry consulted? No   Frederica Kuster, MD 01/12/18 [phone]

## 2018-01-12 NOTE — ED Triage Notes (Signed)
Pt states she thinks herpes has broken out on her lip, is taking famciclovir. Pt states shes been stressed and she gets breakouts when shes stressed.

## 2022-09-15 ENCOUNTER — Ambulatory Visit (HOSPITAL_COMMUNITY)
Admission: EM | Admit: 2022-09-15 | Discharge: 2022-09-15 | Disposition: A | Discharge status: Home / Self Care | Payer: 59

## 2022-09-15 ENCOUNTER — Encounter (HOSPITAL_COMMUNITY): Payer: Self-pay | Admitting: *Deleted

## 2022-09-15 DIAGNOSIS — L97509 Non-pressure chronic ulcer of other part of unspecified foot with unspecified severity: Secondary | ICD-10-CM | POA: Diagnosis not present

## 2022-09-15 DIAGNOSIS — M792 Neuralgia and neuritis, unspecified: Secondary | ICD-10-CM

## 2022-09-15 DIAGNOSIS — E11621 Type 2 diabetes mellitus with foot ulcer: Secondary | ICD-10-CM

## 2022-09-15 DIAGNOSIS — L089 Local infection of the skin and subcutaneous tissue, unspecified: Secondary | ICD-10-CM

## 2022-09-15 MED ORDER — KETOROLAC TROMETHAMINE 30 MG/ML IJ SOLN
INTRAMUSCULAR | Status: AC
  Filled 2022-09-15: qty 1

## 2022-09-15 MED ORDER — KETOROLAC TROMETHAMINE 30 MG/ML IJ SOLN
30.0000 mg | Freq: Once | INTRAMUSCULAR | Status: AC
  Administered 2022-09-15: 30 mg via INTRAMUSCULAR

## 2022-09-15 MED ORDER — SULFAMETHOXAZOLE-TRIMETHOPRIM 800-160 MG PO TABS: 1.0000 | ORAL_TABLET | Freq: Two times a day (BID) | ORAL | 0 refills | Status: DC

## 2022-09-15 NOTE — Discharge Instructions (Signed)
I believe your numbness is due to your peripheral neuropathy, which is due in part to your diabetes.  You do have a ulcer to your distal toe.  Please continue to do warm Epsom salt baths.  Please take the antibiotics to help cover for any infectious pathogens.  Please take all antibiotics as prescribed and until finished, you can take them with food to prevent gastrointestinal upset.  Keep your podiatry appointment as scheduled.  Our staff will help you set up a primary care appointment, and at this appointment you can discuss your peripheral neuropathy and chronic pain.  Please ensure you are taking your metformin daily, as prescribed.  Return to clinic for any new or urgent symptoms.

## 2022-09-15 NOTE — ED Triage Notes (Signed)
Pt states her left middle toe has been hurting and discolored for a long time. She states the pain started recently. She has been using OTC meds without relief. She has a podiatry appt on 25th of this month.

## 2022-09-15 NOTE — ED Notes (Signed)
Pt declined PCP appt states she will make one on her own when she gets home. She was given instructions on how to scan the QR code and schedule. Advised the importance of making this appt, pt verbalized understanding.

## 2022-09-15 NOTE — ED Provider Notes (Signed)
[facility]    CSN: [id number] Arrival date & time: 09/15/22  1405      History   Chief Complaint Chief Complaint  Patient presents with   Toe Pain    HPI Vanessa [name] is a 67 y.o. female.   Patient presents to clinic over concern of a ulcer to her distal third toe on her left foot.  Reports her discoloration has been present for over a year.  Reports her pain recently started a few days ago.  Toe is red, swollen and tended to palpation. She has been soaking her feet in warm water with Epson salt. She has been having intermittent numbness in her feet, does have a history of peripheral neuropathy and she is on Lyrica.   She is a type II diabetic, reports noncompliance with her metformin.  Reports she barely take it is because her blood sugars have been running good, when she wakes up her blood sugars range between 140-200 and are usually higher in the morning than throughout the day. Recently moved back to [place] and does not have a PCP set up yet.    The history is provided by the patient and medical records.  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   S/p radiation ablation with iodine per patient 1982   Heart murmur    Hernia, umbilical    Herniated disc    Herniated disc    Hyperlipidemia    Hypertension    Menopause    Neuropathy    OSA (obstructive sleep apnea)    has a cpap   Osteoporosis    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   Rheumatoid arthritis  (HCC)    Shingles    Sleep apnea    wear c-pap   Tear of lateral meniscus of right knee 10/13/2013   Wears dentures    top   Wears glasses     Patient Active Problem List   Diagnosis Date Noted   Epistaxis, recurrent 11/12/2016   Tobacco abuse 11/12/2016   Pharyngeal mass 06/09/2016   Alcohol abuse 06/09/2016   Alcohol abuse, in remission 06/09/2016   Atypical pneumonia 12/18/2014   Essential hypertension 12/17/2014   COPD exacerbation (HCC) 12/16/2014   Tear of lateral meniscus of right knee 10/13/2013   Chest pain 07/23/2013   GERD (gastroesophageal reflux disease) 07/23/2013   Morbid obesity (HCC) 07/23/2013   Renal failure (ARF), acute on chronic (HCC) 07/23/2013   HLD (hyperlipidemia) 07/23/2013   Dysarthria 10/05/2012   Diarrhea 05/27/2012   Chronic back pain 05/27/2012   Diabetes mellitus (HCC) 05/26/2012   Hypothyroidism 05/26/2012   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   Depression 05/26/2012   Generalized anxiety disorder 05/26/2012   OSA (obstructive sleep apnea) 03/18/2011    Past Surgical History:  Procedure Laterality Date   CHOLECYSTECTOMY     EYE  SURGERY     eye back in socker-rt   FOOT SURGERY     cyst rt foot   INCISION AND DRAINAGE OF PERITONSILLAR ABCESS Right 06/09/2016   Procedure: INCISION AND DRAINAGE OF PERITONSILLAR ABCESS;  Surgeon: Flo Shanks, MD;  Location: [facility] OR;  Service: ENT;  Laterality: Right;   KNEE ARTHROSCOPY WITH LATERAL MENISECTOMY Right 10/13/2013   Procedure: RIGHT KNEE ARTHROSCOPY WITH PARTIAL LATERAL MENISECTOMY/DEBRIDEMENT/SHAVING ;  Surgeon: Eulas Post, MD;  Location: [facility];  Service: Orthopedics;  Laterality: Right;   LAPAROSCOPY     age 8   LEFT HEART CATHETERIZATION WITH CORONARY ANGIOGRAM N/A 05/30/2012   Procedure: LEFT HEART CATHETERIZATION WITH CORONARY ANGIOGRAM;  Surgeon: [name], MD;  Location: [facility] CATH LAB;  Service: Cardiovascular;  Laterality: N/A;   TONSILLECTOMY      TONSILLECTOMY AND ADENOIDECTOMY Right 06/09/2016   Procedure: RIGHT TONSILECTOMY;  Surgeon: Flo Shanks, MD;  Location: [facility] OR;  Service: ENT;  Laterality: Right;   TUBAL LIGATION      OB History     Gravida  5   Para  3   Term      Preterm      AB  2   Living  3      SAB  2   IAB      Ectopic      Multiple      Live Births               Home Medications    Prior to Admission medications   Medication Sig Start Date End Date Taking? Authorizing Provider  acyclovir (ZOVIRAX) 400 MG tablet Take by mouth. 08/29/12  Yes [provider]  atenolol (TENORMIN) 25 MG tablet Take 25 mg by mouth daily.   Yes [provider]  atorvastatin (LIPITOR) 40 MG tablet Take 1 tablet (40 mg total) by mouth daily. 10/06/12  Yes Gust Rung, DO  budesonide-formoterol (SYMBICORT) 160-4.5 MCG/ACT inhaler Inhale 2 puffs into the lungs 2 (two) times daily.   Yes [provider]  busPIRone (BUSPAR) 10 MG tablet Take 20 mg by mouth 2 (two) times daily.    Yes [provider]  clotrimazole-betamethasone (LOTRISONE) cream Apply topically. 08/29/12  Yes [provider]  cycloSPORINE (RESTASIS) 0.05 % ophthalmic emulsion Place 1 drop into both eyes 2 (two) times daily.   Yes [provider]  HYDROcodone-acetaminophen (NORCO) 10-325 MG tablet Take 0.5-1 tablets by mouth every 6 (six) hours as needed for severe pain.    Yes [provider]  levothyroxine (SYNTHROID, LEVOTHROID) 175 MCG tablet Take 1 tablet (175 mcg total) by mouth every morning. 03/10/16  Yes Audry Pili, PA-C  loratadine (CLARITIN) 10 MG tablet Take 10 mg by mouth daily.   Yes [provider]  metFORMIN (GLUCOPHAGE) 1000 MG tablet Take 1 tablet (1,000 mg total) by mouth 2 (two) times daily. Patient taking differently: Take 500 mg by mouth daily with breakfast. 03/10/16  Yes Audry Pili, PA-C  pantoprazole (PROTONIX) 40 MG tablet Take by mouth. 08/29/12  Yes  [provider]  pregabalin (LYRICA) 150 MG capsule Take 150 mg by mouth 2 (two) times daily.   Yes [provider]  QUEtiapine (SEROQUEL) 50 MG tablet Take 50 mg by mouth at bedtime.   Yes [provider]  sulfamethoxazole-trimethoprim (BACTRIM DS) 800-160 MG tablet Take 1 tablet by mouth 2 (two) times daily for 7 days. 09/15/22 09/22/22 Yes Lynnelle Mesmer, [place], FNP  albuterol (  PROAIR HFA) 108 (90 BASE) MCG/ACT inhaler Inhale 2 puffs into the lungs 4 (four) times daily. For COPD    [provider]  albuterol (PROVENTIL) (2.5 MG/3ML) 0.083% nebulizer solution Take 2.5 mg by nebulization 3 (three) times daily as needed for wheezing or shortness of breath.     [provider]  azithromycin (ZITHROMAX) 250 MG tablet Take 1 tablet (250 mg total) by mouth daily. 04/14/17   Bethel Born, PA-C  cyclobenzaprine (FLEXERIL) 10 MG tablet Take 1 tablet (10 mg total) by mouth 2 (two) times daily as needed for muscle spasms. 10/30/17   Janne Napoleon, NP  famciclovir (FAMVIR) 500 MG tablet Take by mouth 3 (three) times daily.    [provider]  fluticasone (FLOVENT DISKUS) 50 MCG/BLIST diskus inhaler Inhale 1 puff into the lungs 2 (two) times daily.    [provider]  hydrOXYzine (ATARAX/VISTARIL) 25 MG tablet Take 1 tablet (25 mg total) by mouth every 6 (six) hours. 10/30/17   Janne Napoleon, NP  ipratropium-albuterol (DUONEB) 0.5-2.5 (3) MG/3ML SOLN Take 3 mLs by nebulization every 6 (six) hours as needed (for wheezing or shortness of breath).     [provider]  magic mouthwash w/lidocaine SOLN Take 5 mLs by mouth 4 (four) times daily as needed for mouth pain. 01/12/18   Frederica Kuster, MD  meloxicam (MOBIC) 15 MG tablet Take 15 mg by mouth daily.    [provider]  nystatin (MYCOSTATIN) 100000 UNIT/ML suspension Take 5 mLs by mouth 3 (three) times daily. GARGLE AND SWALLOW    [provider]  ranitidine (ZANTAC) 150 MG  tablet Take 1 tablet (150 mg total) by mouth 2 (two) times daily. 01/08/17   Janne Napoleon, NP  sennosides-docusate sodium (SENOKOT-S) 8.6-50 MG tablet Take 2 tablets by mouth daily. Patient taking differently: Take 2 tablets by mouth daily as needed for constipation. 10/13/13   Teryl Lucy, MD  tiotropium (SPIRIVA) 18 MCG inhalation capsule Place 1 capsule (18 mcg total) into inhaler and inhale daily. 12/18/14   Valentino Nose, MD  verapamil (CALAN) 40 MG tablet Take 40 mg by mouth 3 (three) times daily.    [provider]    Family History Family History  Problem Relation Age of Onset   Emphysema Mother    Allergies Mother    Asthma Mother    Heart disease Mother    Rheum arthritis Mother    Diabetes Mother    Kidney disease Mother    Allergies Daughter    Asthma Brother    Breast cancer Other        aunt   Lung cancer Brother    Throat cancer Brother    Colon cancer Neg Hx    Colon polyps Neg Hx    Esophageal cancer Neg Hx    Rectal cancer Neg Hx    Stomach cancer Neg Hx     Social History Social History   Tobacco Use   Smoking status: Every Day    Packs/day: 1.00    Years: 40.00    Additional pack years: 0.00    Total pack years: 40.00    Types: Cigarettes   Smokeless tobacco: Never  Vaping Use   Vaping Use: Never used  Substance Use Topics   Alcohol use: No    Alcohol/week: 0.0 standard drinks of alcohol   Drug use: Yes    Types: Marijuana    Comment: opioids      Allergies  Fluocinolone, Suboxone [buprenorphine hcl-naloxone hcl], Ciprofloxacin, and Penicillins   Review of Systems Review of Systems  Skin:  Positive for wound.  Neurological:  Positive for numbness.     Physical Exam Triage Vital Signs ED Triage Vitals  Enc Vitals Group     BP 09/15/22 1435 (!) 161/71     Pulse Rate 09/15/22 1435 79     Resp 09/15/22 1435 20     Temp 09/15/22 1435 98.1 F (36.7 C)     Temp src --      SpO2 09/15/22 1435 94 %     Weight --       Height --      Head Circumference --      Peak Flow --      Pain Score 09/15/22 1429 10     Pain Loc --      Pain Edu? --      Excl. in GC? --    No data found.  Updated Vital Signs BP (!) 161/71 (BP Location: Left Arm)   Pulse 79   Temp 98.1 F (36.7 C)   Resp 20   SpO2 94%   Visual Acuity Right Eye Distance:   Left Eye Distance:   Bilateral Distance:    Right Eye Near:   Left Eye Near:    Bilateral Near:     Physical Exam Vitals and nursing note reviewed.  Constitutional:      Appearance: Normal appearance.  HENT:     Head: Normocephalic and atraumatic.     Right Ear: External ear normal.     Left Ear: External ear normal.     Nose: Nose normal.     Mouth/Throat:     Mouth: Mucous membranes are moist.  Eyes:     Conjunctiva/sclera: Conjunctivae normal.  Cardiovascular:     Rate and Rhythm: Normal rate.     Pulses:          Dorsalis pedis pulses are 2+ on the right side and 2+ on the left side.       Posterior tibial pulses are 2+ on the right side and 2+ on the left side.  Pulmonary:     Effort: Pulmonary effort is normal. No respiratory distress.  Musculoskeletal:        General: Swelling and tenderness present. No deformity or signs of injury. Normal range of motion.     Right lower leg: No edema.     Left lower leg: No edema.       Feet:  Feet:     Right foot:     Skin integrity: Skin integrity normal.     Left foot:     Skin integrity: Ulcer present.     Comments: Ulcer to distal third toe of left foot noted.  Third toe with erythema, tenderness to palpation and slightly more swollen.  Brisk capillary refill, sensation intact. Skin:    General: Skin is warm and dry.     Capillary Refill: Capillary refill takes less than 2 seconds.  Neurological:     General: No focal deficit present.     Mental Status: She is alert.  Psychiatric:        Mood and Affect: Mood normal.        Behavior: Behavior is cooperative.      UC Treatments / Results   Labs (all labs ordered are listed, but only abnormal results are displayed) Labs Reviewed - No data to display  EKG   Radiology No  results found.  Procedures Procedures (including critical care time)  Medications Ordered in UC Medications  ketorolac (TORADOL) 30 MG/ML injection 30 mg (30 mg Intramuscular Given 09/15/22 1452)    Initial Impression / Assessment and Plan / UC Course  I have reviewed the triage vital signs and the nursing notes.  Pertinent labs & imaging results that were available during my care of the patient were reviewed by me and considered in my medical decision making (see chart for details).  Vitals and triage reviewed, patient is hemodynamically stable.  Concern for infection to a foot ulcer of her left third distal toe, complicated by DM2.  Neurovascularly intact.  Will cover with Bactrim, has penicillin allergy, will avoid Keflex.  Encouraged to follow-up with podiatry as scheduled next week.  Patient requesting narcotic pain medication in clinic for pain management and peripheral neuropathy, discussed that that is not appropriate.  Provided IM Toradol.  Staff to schedule with the PCP for further evaluation of peripheral neuropathy and pain management.  Plan of care, follow-up care and return precautions given, no questions at this time.    Final Clinical Impressions(s) / UC Diagnoses   Final diagnoses:  Toe infection  Peripheral neuropathic pain  Type 2 diabetes mellitus with foot ulcer, without long-term current use of insulin (HCC)     Discharge Instructions      I believe your numbness is due to your peripheral neuropathy, which is due in part to your diabetes.  You do have a ulcer to your distal toe.  Please continue to do warm Epsom salt baths.  Please take the antibiotics to help cover for any infectious pathogens.  Please take all antibiotics as prescribed and until finished, you can take them with food to prevent gastrointestinal upset.  Keep  your podiatry appointment as scheduled.  Our staff will help you set up a primary care appointment, and at this appointment you can discuss your peripheral neuropathy and chronic pain.  Please ensure you are taking your metformin daily, as prescribed.  Return to clinic for any new or urgent symptoms.      ED Prescriptions     Medication Sig Dispense Auth. Provider   sulfamethoxazole-trimethoprim (BACTRIM DS) 800-160 MG tablet Take 1 tablet by mouth 2 (two) times daily for 7 days. 14 tablet Malala Trenkamp, [place], [place]      PDMP not reviewed this encounter.   Louay Myrie, [place] N, [place] 09/15/22 [phone]

## 2022-09-22 ENCOUNTER — Ambulatory Visit (INDEPENDENT_AMBULATORY_CARE_PROVIDER_SITE_OTHER): Payer: 59 | Admitting: Podiatry

## 2022-09-22 DIAGNOSIS — M79675 Pain in left toe(s): Secondary | ICD-10-CM

## 2022-09-22 DIAGNOSIS — E11628 Type 2 diabetes mellitus with other skin complications: Secondary | ICD-10-CM

## 2022-09-22 DIAGNOSIS — L84 Corns and callosities: Secondary | ICD-10-CM

## 2022-09-22 DIAGNOSIS — B351 Tinea unguium: Secondary | ICD-10-CM | POA: Diagnosis not present

## 2022-09-22 DIAGNOSIS — M2042 Other hammer toe(s) (acquired), left foot: Secondary | ICD-10-CM | POA: Diagnosis not present

## 2022-09-22 DIAGNOSIS — M79674 Pain in right toe(s): Secondary | ICD-10-CM | POA: Diagnosis not present

## 2022-09-22 NOTE — Progress Notes (Unsigned)
Subjective:  Patient ID: Vanessa [name], female    DOB: [birth year],  MRN: [id number]   Vanessa [name] presents to clinic today for:  Chief Complaint  Patient presents with   Nail Problem    3rd toe nail on left foot is brown and is causing pain. Pain has been there for like 3 months but the color has been there for years. Hearts all the time that feels like shooting pain and is constant. She is also here for a RFC and has callus . Callus keeps her from wearing shoes  . Patient notes nails are thick, discolored, elongated and painful in shoegear when trying to ambulate.  Patient is a type II diabetic.  States that she does not always take her metformin.  States that she does not take it often because her blood sugars have been running well.  Notes that when she wakes up her blood sugar ranges between 140 and 200 mg/dL, and are usually higher in the morning then throughout the rest of the day.  She recently moved back to [place] and does not have a primary care physician assigned yet.  She was in the emergency department regarding a painful left third toe infected corn and is currently on Bactrim.  States that the area has closed up.  PCP is Patient, No Pcp Per.  Allergies  Allergen Reactions   Fluocinolone Hives    Pt does not recall this as a allergy   Suboxone [Buprenorphine Hcl-Naloxone Hcl] Itching    Vaginal itching    Ciprofloxacin Hives and Itching   Penicillins Hives and Itching    Has patient had a PCN reaction causing immediate rash, facial/tongue/throat swelling, SOB or lightheadedness with hypotension: Yes Has patient had a PCN reaction causing severe rash involving mucus membranes or skin necrosis: No Has patient had a PCN reaction that required hospitalization: No Has patient had a PCN reaction occurring within the last 10 years: No If all of the above answers are "NO", then may proceed with Cephalosporin use.     Review of Systems: Negative except as  noted in the HPI.  Objective:  There were no vitals filed for this visit.  Vanessa [name] is a pleasant 67 y.o. female in NAD. AAO x 3.  Vascular Examination: Capillary refill time is 3-5 seconds to toes bilateral. Palpable pedal pulses b/l LE. Digital hair sparse b/l. No pedal edema b/l. Skin temperature gradient WNL b/l. No varicosities b/l. No cyanosis or clubbing noted b/l.   Dermatological Examination: Pedal skin with normal turgor, texture and tone b/l. No open wounds. No interdigital macerations b/l. Toenails x10 are 3mm thick, discolored, dystrophic with subungual debris. There is pain with compression of the nail plates.  They are elongated x10.  There is a hyperkeratotic lesion submet 3 left foot as well as the distal aspect of left third toe.  There is no open ulceration at this time.  There is minimal hyperkeratosis.  No surrounding erythema is seen.  No drainage is noted.  There is a hyperkeratotic lesion on the plantar medial aspect the right hallux IPJ.  Neurological Examination: Protective sensation intact bilateral LE. Vibratory sensation i diminished bilateral LE.  Musculoskeletal Examination: Muscle strength 5/5 to all LE muscle groups b/l.  There is contracture of the left third toe at the PIP joint.  This is not fully reducible.  Assessment/Plan: 1. Pain due to onychomycosis of toenails of both feet   2. Callus of foot  3. Type 2 diabetes mellitus with other skin complication, without long-term current use of insulin (HCC)   4. Hammertoe of left foot     The mycotic toenails were sharply debrided x10 with sterile nail nippers and a power debriding burr to decrease bulk/thickness and length.    The hyperkeratotic lesions were sharply debrided with sterile #313 blade.  She was fitted for a gel crest pad for the left third toe to try to elevate the tip of the toe to prevent recurrence of the corn/ulcer.  Discussed hammertoe correction in the future if she continues to  have this issue.  However she needs to make sure her blood sugar is under good control to undergo elective surgery.  Urged patient to get set up soon with a primary care physician to manage her diabetes  Return in about 3 months (around 12/23/2022) for [facility].   Clerance Lav, DPM, FACFAS [facility]     [address] [place], [place] [address]                Office [phone]  Fax [phone]

## 2022-10-27 ENCOUNTER — Ambulatory Visit (INDEPENDENT_AMBULATORY_CARE_PROVIDER_SITE_OTHER): Payer: 59 | Admitting: Podiatry

## 2022-10-27 ENCOUNTER — Encounter: Payer: Self-pay | Admitting: Podiatry

## 2022-10-27 DIAGNOSIS — M2042 Other hammer toe(s) (acquired), left foot: Secondary | ICD-10-CM | POA: Diagnosis not present

## 2022-10-27 DIAGNOSIS — E11628 Type 2 diabetes mellitus with other skin complications: Secondary | ICD-10-CM

## 2022-10-27 DIAGNOSIS — L84 Corns and callosities: Secondary | ICD-10-CM

## 2022-10-27 NOTE — Progress Notes (Signed)
Chief Complaint  Patient presents with   Nail Problem    2nd toe nail on left foot is brown and is causing pain. Pain has been there for sometimes now she gets  pain and is constant. She is also here for a RFC and has callus . Callus keeps her from wearing shoes   HPI: 67 y.o. female presents today for follow-up of an ulcerated corn on the distal aspect of the left third toe.  Patient states this is doing much better and feels like it is almost resolved now.  She also has a painful callus on the ball of the left foot.  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   S/p radiation ablation with iodine per patient 1982   Heart murmur    Hernia, umbilical    Herniated disc    Herniated disc    Hyperlipidemia    Hypertension    Menopause    Neuropathy    OSA (obstructive sleep apnea)    has a cpap   Osteoporosis    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   Rheumatoid arthritis (HCC)    Shingles    Sleep apnea    wear c-pap   Tear of lateral meniscus of right knee 10/13/2013   Wears dentures    top   Wears glasses     Past Surgical History:  Procedure Laterality Date   CHOLECYSTECTOMY     EYE SURGERY     eye back in socker-rt   FOOT SURGERY     cyst rt foot   INCISION AND DRAINAGE OF PERITONSILLAR ABCESS Right 06/09/2016   Procedure: INCISION AND DRAINAGE OF PERITONSILLAR ABCESS;  Surgeon: Flo Shanks, MD;  Location: [facility] OR;  Service: ENT;  Laterality: Right;   KNEE ARTHROSCOPY WITH LATERAL  MENISECTOMY Right 10/13/2013   Procedure: RIGHT KNEE ARTHROSCOPY WITH PARTIAL LATERAL MENISECTOMY/DEBRIDEMENT/SHAVING ;  Surgeon: Eulas Post, MD;  Location: [facility];  Service: Orthopedics;  Laterality: Right;   LAPAROSCOPY     age 18   LEFT HEART CATHETERIZATION WITH CORONARY ANGIOGRAM N/A 05/30/2012   Procedure: LEFT HEART CATHETERIZATION WITH CORONARY ANGIOGRAM;  Surgeon: [name], MD;  Location: [facility] CATH LAB;  Service: Cardiovascular;  Laterality: N/A;   TONSILLECTOMY     TONSILLECTOMY AND ADENOIDECTOMY Right 06/09/2016   Procedure: RIGHT TONSILECTOMY;  Surgeon: Flo Shanks, MD;  Location: [facility] OR;  Service: ENT;  Laterality: Right;   TUBAL LIGATION      Allergies  Allergen Reactions   Fluocinolone Hives    Pt does not recall this as a allergy   Suboxone [Buprenorphine Hcl-Naloxone Hcl] Itching    Vaginal itching    Ciprofloxacin Hives and Itching   Penicillins Hives and Itching    Has patient had a PCN reaction causing immediate rash, facial/tongue/throat swelling, SOB or lightheadedness with hypotension: Yes Has  patient had a PCN reaction causing severe rash involving mucus membranes or skin necrosis: No Has patient had a PCN reaction that required hospitalization: No Has patient had a PCN reaction occurring within the last 10 years: No If all of the above answers are "NO", then may proceed with Cephalosporin use.       Physical Exam: On exam there are palpable pedal pulses to the left foot.  There is minimal hyperkeratosis at the distal aspect of the left third toe.  No erythema or open areas are noted.  No edema is noted.  There is a hyperkeratotic lesion submet 3 left foot.  There is pain on palpation of the lesion.  No ulceration is noted.  There is mild contracture of the left third toe at the PIP joint.  Assessment/Plan of Care: 1. Type 2 diabetes mellitus with other skin complication, without long-term current use of insulin (HCC)   2. Callus  of foot   3. Hammertoe of left foot     Discussed clinical findings with patient today.  The remaining hyperkeratotic tissue at the distal aspect of left third toe was shaved with a sterile #313 blade uneventfully.  Also the left submet 3 lesion was shaved in the same manner.  Patient was dispensed a gel crest pad to support the hammertoe deformity and keep pressure off the tip of the left third toe.  She was also given felt offloading pads for the plantar callus.  She noted immediate improvement upon weightbearing.  Follow-up as needed   Clerance Lav, DPM, FACFAS [facility]     [address] [place], [place] [address]                Office [phone]  Fax [phone]

## 2022-12-26 ENCOUNTER — Ambulatory Visit (HOSPITAL_COMMUNITY)
Admission: EM | Admit: 2022-12-26 | Discharge: 2022-12-26 | Disposition: A | Discharge status: Home / Self Care | Payer: Medicare HMO | Attending: Internal Medicine | Admitting: Internal Medicine

## 2022-12-26 ENCOUNTER — Encounter (HOSPITAL_COMMUNITY): Payer: Self-pay | Admitting: Emergency Medicine

## 2022-12-26 ENCOUNTER — Other Ambulatory Visit: Payer: Self-pay | Admitting: Physician Assistant

## 2022-12-26 DIAGNOSIS — G629 Polyneuropathy, unspecified: Secondary | ICD-10-CM

## 2022-12-26 DIAGNOSIS — R079 Chest pain, unspecified: Secondary | ICD-10-CM

## 2022-12-26 DIAGNOSIS — R1084 Generalized abdominal pain: Secondary | ICD-10-CM

## 2022-12-26 MED ORDER — LINACLOTIDE 72 MCG PO CAPS: 72.0000 ug | ORAL_CAPSULE | Freq: Every day | ORAL | 0 refills | Status: AC

## 2022-12-26 MED ORDER — PREGABALIN 50 MG PO CAPS: 50.0000 mg | ORAL_CAPSULE | Freq: Two times a day (BID) | ORAL | 0 refills | Status: AC

## 2022-12-26 NOTE — ED Provider Notes (Signed)
[facility]    CSN: [id number] Arrival date & time: 12/26/22  1752      History   Chief Complaint Chief Complaint  Patient presents with   Abdominal Pain   Chest Pain    HPI Vanessa [name] is a 67 y.o. female with significant medical history as listed, presenting for abdominal pain, mostly right-sided and epigastric, as well as pain that is radiating to the right side of her chest and arm.  Patient reports that pain started around 12/17/2022.  She reports that she was diagnosed with diverticulitis at that time (?) but I do not see any antibiotics listed or any notes indicating this diagnosis.  She does have several recurrent admissions in regard to her abdominal pain.  The most recent admission was in January this year while in [place] and with diagnoses of abdominal pain in female, SIRS, uncontrolled diabetes.  She is a poor historian.  She is just establishing care with a PCP in the area.  She is requesting Linzess and pregabalin be refilled.  She has been taking ibuprofen for her pain.  She has also been taking probiotics, vitamin K, vitamin D.  She denies being any more short of breath than normal for her.  No nausea or vomiting.  No diarrhea.  No urinary issues. No blood in her stool.  No fever or chills.  States that she just feels like her stomach is really swollen and she cannot get any relief.   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   S/p radiation ablation with  iodine per patient 1982   Heart murmur    Hernia, umbilical    Herniated disc    Herniated disc    Hyperlipidemia    Hypertension    Menopause    Neuropathy    OSA (obstructive sleep apnea)    has a cpap   Osteoporosis    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   Rheumatoid arthritis (HCC)    Shingles    Sleep apnea    wear c-pap   Tear of lateral meniscus of right knee 10/13/2013   Wears dentures    top   Wears glasses     Patient Active Problem List   Diagnosis Date Noted   Epistaxis, recurrent 11/12/2016   Tobacco abuse 11/12/2016   Pharyngeal mass 06/09/2016   Alcohol abuse 06/09/2016   Alcohol abuse, in remission 06/09/2016   Atypical pneumonia 12/18/2014   Essential hypertension 12/17/2014   COPD exacerbation (HCC) 12/16/2014   Tear of lateral meniscus of right knee 10/13/2013   Chest pain 07/23/2013   GERD (gastroesophageal reflux disease) 07/23/2013   Morbid obesity (HCC) 07/23/2013   Renal failure (ARF), acute on chronic (HCC) 07/23/2013   HLD (hyperlipidemia) 07/23/2013   Dysarthria 10/05/2012   Diarrhea 05/27/2012   Chronic back pain 05/27/2012   Diabetes mellitus (HCC) 05/26/2012   Hypothyroidism  05/26/2012   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   Depression 05/26/2012   Generalized anxiety disorder 05/26/2012   OSA (obstructive sleep apnea) 03/18/2011    Past Surgical History:  Procedure Laterality Date   CHOLECYSTECTOMY     EYE SURGERY     eye back in socker-rt   FOOT SURGERY     cyst rt foot   INCISION AND DRAINAGE OF PERITONSILLAR ABCESS Right 06/09/2016   Procedure: INCISION AND DRAINAGE OF PERITONSILLAR ABCESS;  Surgeon: Flo Shanks, MD;  Location: [facility] OR;  Service: ENT;  Laterality: Right;   KNEE ARTHROSCOPY WITH LATERAL MENISECTOMY Right 10/13/2013   Procedure: RIGHT KNEE ARTHROSCOPY WITH PARTIAL LATERAL MENISECTOMY/DEBRIDEMENT/SHAVING ;  Surgeon: Eulas Post, MD;  Location: [facility];  Service: Orthopedics;  Laterality: Right;    LAPAROSCOPY     age 89   LEFT HEART CATHETERIZATION WITH CORONARY ANGIOGRAM N/A 05/30/2012   Procedure: LEFT HEART CATHETERIZATION WITH CORONARY ANGIOGRAM;  Surgeon: [name], MD;  Location: [facility] CATH LAB;  Service: Cardiovascular;  Laterality: N/A;   TONSILLECTOMY     TONSILLECTOMY AND ADENOIDECTOMY Right 06/09/2016   Procedure: RIGHT TONSILECTOMY;  Surgeon: Flo Shanks, MD;  Location: [facility] OR;  Service: ENT;  Laterality: Right;   TUBAL LIGATION      OB History     Gravida  5   Para  3   Term      Preterm      AB  2   Living  3      SAB  2   IAB      Ectopic      Multiple      Live Births               Home Medications    Prior to Admission medications   Medication Sig Start Date End Date Taking? Authorizing Provider  linaclotide (LINZESS) 72 MCG capsule Take 1 capsule (72 mcg total) by mouth daily before breakfast for 15 days. 12/26/22 01/10/23 Yes Laker Thompson M, PA-C  pregabalin (LYRICA) 50 MG capsule Take 1 capsule (50 mg total) by mouth 2 (two) times daily for 5 days. 12/26/22 12/31/22 Yes Haji Delaine M, PA-C  acyclovir (ZOVIRAX) 400 MG tablet Take by mouth. 08/29/12   [provider]  albuterol (PROAIR HFA) 108 (90 BASE) MCG/ACT inhaler Inhale 2 puffs into the lungs 4 (four) times daily. For COPD    [provider]  albuterol (PROVENTIL) (2.5 MG/3ML) 0.083% nebulizer solution Take 2.5 mg by nebulization 3 (three) times daily as needed for wheezing or shortness of breath.     [provider]  atenolol (TENORMIN) 25 MG tablet Take 25 mg by mouth daily.    [provider]  atorvastatin (LIPITOR) 40 MG tablet Take 1 tablet (40 mg total) by mouth daily. 10/06/12   Gust Rung, DO  budesonide-formoterol (SYMBICORT) 160-4.5 MCG/ACT inhaler Inhale 2 puffs into the lungs 2 (two) times daily.    [provider]  busPIRone (BUSPAR) 10 MG tablet Take 20 mg by mouth 2 (two) times daily.     [provider]  clotrimazole-betamethasone (LOTRISONE) cream Apply topically. 08/29/12   [provider]  cycloSPORINE (RESTASIS) 0.05 % ophthalmic emulsion Place 1 drop into both eyes 2 (two) times daily.    [provider]  fluticasone (FLOVENT DISKUS) 50 MCG/BLIST diskus inhaler Inhale 1 puff into the lungs 2 (two) times daily.    [provider]  hydrOXYzine (ATARAX/VISTARIL) 25 MG  tablet Take 1 tablet (25 mg total) by mouth every 6 (six) hours. 10/30/17   Janne Napoleon, NP  loratadine (CLARITIN) 10 MG tablet Take 10 mg by mouth daily.    [provider]  metFORMIN (GLUCOPHAGE) 1000 MG tablet Take 1 tablet (1,000 mg total) by mouth 2 (two) times daily. Patient taking differently: Take 500 mg by mouth daily with breakfast. 03/10/16   Audry Pili, PA-C  nystatin (MYCOSTATIN) 100000 UNIT/ML suspension Take 5 mLs by mouth 3 (three) times daily. GARGLE AND SWALLOW    [provider]  QUEtiapine (SEROQUEL) 50 MG tablet Take 50 mg by mouth at bedtime.    [provider]  tiotropium (SPIRIVA) 18 MCG inhalation capsule Place 1 capsule (18 mcg total) into inhaler and inhale daily. 12/18/14   Valentino Nose, MD    Family History Family History  Problem Relation Age of Onset   Emphysema Mother    Allergies Mother    Asthma Mother    Heart disease Mother    Rheum arthritis Mother    Diabetes Mother    Kidney disease Mother    Allergies Daughter    Asthma Brother    Breast cancer Other        aunt   Lung cancer Brother    Throat cancer Brother    Colon cancer Neg Hx    Colon polyps Neg Hx    Esophageal cancer Neg Hx    Rectal cancer Neg Hx    Stomach cancer Neg Hx     Social History Social History   Tobacco Use   Smoking status: Every Day    Current packs/day: 1.00    Average packs/day: 1 pack/day for 40.0 years (40.0 ttl pk-yrs)    Types: Cigarettes   Smokeless tobacco: Never  Vaping Use   Vaping status: Never Used  Substance Use  Topics   Alcohol use: No    Alcohol/week: 0.0 standard drinks of alcohol   Drug use: Yes    Types: Marijuana    Comment: opioids      Allergies   Fluocinolone, Suboxone [buprenorphine hcl-naloxone hcl], Ciprofloxacin, and Penicillins   Review of Systems Review of Systems  Cardiovascular:  Positive for chest pain.  Gastrointestinal:  Positive for abdominal pain.     Physical Exam Triage Vital Signs ED Triage Vitals  Encounter Vitals Group     BP 12/26/22 1812 128/85     Systolic BP Percentile --      Diastolic BP Percentile --      Pulse Rate 12/26/22 1812 87     Resp 12/26/22 1812 17     Temp 12/26/22 1812 98.3 F (36.8 C)     Temp src --      SpO2 12/26/22 1812 97 %     Weight --      Height --      Head Circumference --      Peak Flow --      Pain Score 12/26/22 1811 8     Pain Loc --      Pain Education --      Exclude from Growth Chart --    No data found.  Updated Vital Signs BP 128/85 (BP Location: Left Arm)   Pulse 87   Temp 98.3 F (36.8 C)   Resp 17   SpO2 97%     Physical Exam Vitals and nursing note reviewed.  Constitutional:      Appearance: She is well-developed. She is obese.  Eyes:     Pupils: Pupils are equal, round, and reactive to light.  Cardiovascular:     Rate and Rhythm: Normal rate and regular rhythm.     Heart sounds: Normal heart sounds. No murmur heard. Pulmonary:     Effort: Pulmonary effort is normal.     Breath sounds: Normal breath sounds.  Abdominal:     General: Bowel sounds are normal. There is distension. There is no abdominal bruit.     Palpations: There is no fluid wave or mass.     Tenderness: There is generalized abdominal tenderness and tenderness in the right upper quadrant, epigastric area and left lower quadrant.     Hernia: No hernia is present.  Skin:    Findings: No rash.  Neurological:     Mental Status: She is alert.      UC Treatments / Results  Labs (all labs ordered are listed, but only  abnormal results are displayed) Labs Reviewed - No data to display  EKG My personal review shows normal sinus rhythm of 86 bpm.  Left axis deviation present.  No acute signs of ischemia.  No obvious changes from prior EKG 2019.  Radiology No results found.  Procedures Procedures (including critical care time)  Medications Ordered in UC Medications - No data to display  Initial Impression / Assessment and Plan / UC Course  I have reviewed the triage vital signs and the nursing notes.  Pertinent labs & imaging results that were available during my care of the patient were reviewed by me and considered in my medical decision making (see chart for details).     Wide differential for patient including but not limited to: acute diverticulitis, acute gastritis, constipation, mesenteric ischemia, IBS, CHF, musculoskeletal pain. Doubt cardiac etiology based on her symptoms being mainly focused in the abdominal regional, EKG reassuring.  I talked with the patient and strongly encouraged her to present to the emergency department for evaluation including labs and CT imaging, she declines at this time and states that she just wants to go in the morning.  She wanted her medications refilled to try to get her through the night, as she states that Linzess and Lyrica has helped her in the past.  I refilled a short course of each of these for her.  She will need to follow-up with her PCP for any further refills.  I do hope that she does get checked out in the morning in the ED to r/o any of the above pathology or worse.  Final Clinical Impressions(s) / UC Diagnoses   Final diagnoses:  Generalized abdominal pain  Right-sided chest pain  Neuropathy     Discharge Instructions      I am worried about the severity of your abdominal pain and chest pain.  I do think it is appropriate for you to go to the emergency department for evaluation at this time.  I have refilled a short course of your Linzess and  your pregabalin are until you are able to get back in with your primary care physician.     ED Prescriptions     Medication Sig Dispense Auth. Provider   linaclotide (LINZESS) 72 MCG capsule Take 1 capsule (72 mcg total) by mouth daily before breakfast for 15 days. 15 capsule Mareta Chesnut M, PA-C   pregabalin (LYRICA) 50 MG capsule Take 1 capsule (50 mg total) by mouth 2 (two) times daily for 5 days. 10 capsule Ciin Brazzel, Crist Infante, PA-C  I have reviewed the PDMP during this encounter.   AllwardtCrist Infante, PA-C 12/26/22 1909

## 2022-12-26 NOTE — Discharge Instructions (Addendum)
I am worried about the severity of your abdominal pain and chest pain.  I do think it is appropriate for you to go to the emergency department for evaluation at this time.  I have refilled a short course of your Linzess and your pregabalin are until you are able to get back in with your primary care physician.

## 2022-12-26 NOTE — ED Triage Notes (Signed)
Pt had pain in right rib cage 9/19 she went to doctor and was diagnosed with diverticulitis. Pt complain that pain is now radiating to right arm and has pain in abdomen and chest. She states her stomach is swollen.   She needs refill for Linzess and pregabalin

## 2022-12-27 ENCOUNTER — Other Ambulatory Visit: Payer: Self-pay

## 2022-12-27 ENCOUNTER — Emergency Department (HOSPITAL_COMMUNITY)
Admission: EM | Admit: 2022-12-27 | Discharge: 2022-12-27 | Disposition: A | Discharge status: Home / Self Care | Payer: Medicare HMO | Attending: Emergency Medicine | Admitting: Emergency Medicine

## 2022-12-27 ENCOUNTER — Emergency Department (HOSPITAL_COMMUNITY): Payer: Medicare HMO

## 2022-12-27 DIAGNOSIS — R1084 Generalized abdominal pain: Secondary | ICD-10-CM | POA: Insufficient documentation

## 2022-12-27 DIAGNOSIS — R109 Unspecified abdominal pain: Secondary | ICD-10-CM

## 2022-12-27 LAB — COMPREHENSIVE METABOLIC PANEL
ALT: 17 U/L (ref 0–44)
AST: 15 U/L (ref 15–41)
Albumin: 3.9 g/dL (ref 3.5–5.0)
Alkaline Phosphatase: 57 U/L (ref 38–126)
Anion gap: 15 (ref 5–15)
BUN: 7 mg/dL — ABNORMAL LOW (ref 8–23)
CO2: 25 mmol/L (ref 22–32)
Calcium: 9 mg/dL (ref 8.9–10.3)
Chloride: 96 mmol/L — ABNORMAL LOW (ref 98–111)
Creatinine, Ser: 0.93 mg/dL (ref 0.44–1.00)
GFR, Estimated: >60 mL/min (ref >60)
Glucose, Bld: 143 mg/dL — ABNORMAL HIGH (ref 70–99)
Potassium: 4 mmol/L (ref 3.5–5.1)
Sodium: 136 mmol/L (ref 135–145)
Total Bilirubin: 0.5 mg/dL (ref 0.3–1.2)
Total Protein: 6.3 g/dL — ABNORMAL LOW (ref 6.5–8.1)

## 2022-12-27 LAB — CBC
HCT: 39.7 % (ref 36.0–46.0)
Hemoglobin: 13.1 g/dL (ref 12.0–15.0)
MCH: 28.9 pg (ref 26.0–34.0)
MCHC: 33 g/dL (ref 30.0–36.0)
MCV: 87.4 fL (ref 80.0–100.0)
Platelets: 207 10*3/uL (ref 150–400)
RBC: 4.54 MIL/uL (ref 3.87–5.11)
RDW: 12.6 % (ref 11.5–15.5)
WBC: 12.5 10*3/uL — ABNORMAL HIGH (ref 4.0–10.5)
nRBC: 0 % (ref 0.0–0.2)

## 2022-12-27 LAB — URINALYSIS, ROUTINE W REFLEX MICROSCOPIC
Bilirubin Urine: NEGATIVE
Glucose, UA: NEGATIVE mg/dL
Hgb urine dipstick: NEGATIVE
Ketones, ur: NEGATIVE mg/dL
Leukocytes,Ua: NEGATIVE
Nitrite: NEGATIVE
Protein, ur: NEGATIVE mg/dL
Specific Gravity, Urine: 1.006 (ref 1.005–1.030)
pH: 5 (ref 5.0–8.0)

## 2022-12-27 LAB — TROPONIN I (HIGH SENSITIVITY)
Troponin I (High Sensitivity): 6 ng/L (ref <18)
Troponin I (High Sensitivity): 8 ng/L (ref <18)

## 2022-12-27 LAB — LIPASE, BLOOD: Lipase: 43 U/L (ref 11–51)

## 2022-12-27 LAB — CBG MONITORING, ED: Glucose-Capillary: 140 mg/dL — ABNORMAL HIGH (ref 70–99)

## 2022-12-27 MED ORDER — FENTANYL CITRATE PF 50 MCG/ML IJ SOSY
50.0000 ug | PREFILLED_SYRINGE | Freq: Once | INTRAMUSCULAR | Status: AC
  Administered 2022-12-27: 50 ug via INTRAVENOUS
  Filled 2022-12-27: qty 1

## 2022-12-27 MED ORDER — FAMOTIDINE IN NACL 20-0.9 MG/50ML-% IV SOLN
20.0000 mg | Freq: Once | INTRAVENOUS | Status: AC
  Administered 2022-12-27: 20 mg via INTRAVENOUS
  Filled 2022-12-27: qty 50

## 2022-12-27 MED ORDER — IOHEXOL 350 MG/ML SOLN
75.0000 mL | Freq: Once | INTRAVENOUS | Status: AC | PRN
  Administered 2022-12-27: 75 mL via INTRAVENOUS

## 2022-12-27 MED ORDER — SODIUM CHLORIDE 0.9 % IV BOLUS
500.0000 mL | Freq: Once | INTRAVENOUS | Status: AC
  Administered 2022-12-27: 500 mL via INTRAVENOUS

## 2022-12-27 MED ORDER — ATENOLOL 25 MG PO TABS: 25.0000 mg | ORAL_TABLET | Freq: Every day | ORAL | 0 refills | Status: AC

## 2022-12-27 NOTE — ED Triage Notes (Signed)
Patient with epigastric pain since 9/19 but today has R sided chest pain. Also reports that her abdomen and face are swollen. Denies sob. Hx of diverticulitis. Also states she feels like she drinks a lot of water but does not urinate much at all except for at night.

## 2022-12-27 NOTE — ED Notes (Signed)
Pt care taken no complaints at this time.

## 2022-12-27 NOTE — ED Notes (Signed)
Pt ambulated to restroom without incident.

## 2022-12-27 NOTE — Discharge Instructions (Signed)
You have been seen and discharged from the emergency department.  Your blood work was normal.  The CT imaging of your chest and abdomen/pelvis did not show any acute finding.  Follow-up with your doctor and gastroenterology for further evaluation.  Follow-up with your primary provider for further evaluation and further care. Take home medications as prescribed. If you have any worsening symptoms or further concerns for your health please return to an emergency department for further evaluation.

## 2022-12-27 NOTE — ED Provider Notes (Signed)
[place] EMERGENCY DEPARTMENT AT [facility] Provider Note   CSN: [id number] Arrival date & time: 12/27/22  1311     History  Chief Complaint  Patient presents with   Chest Pain   Abdominal Pain    Vanessa [name] is a 67 y.o. female.  HPI   67 year old female presents emergency department concern for epigastric pain that started about 2 weeks ago that is now progressed to whole abdominal pain, back pain and right-sided chest pain.  Patient states that she has been traveling a lot for the past 2 weeks, doing a large amount of driving.  She admits that her diet and eating habits have changed for the worst.  Starting 2 weeks ago she felt like she had an upset stomach with nausea which has progressed to a bloated abdomen, whole abdominal pain.  She still having small bowel movements.  Denies any vomiting or diarrhea but has had a significant decreased appetite and some weight loss.  She feels like the pain has progressed to her right chest.  She endorses night sweats, intermittent shortness of breath without cough.  Home Medications Prior to Admission medications   Medication Sig Start Date End Date Taking? Authorizing Provider  acyclovir (ZOVIRAX) 400 MG tablet Take by mouth. 08/29/12   [provider]  albuterol (PROAIR HFA) 108 (90 BASE) MCG/ACT inhaler Inhale 2 puffs into the lungs 4 (four) times daily. For COPD    [provider]  albuterol (PROVENTIL) (2.5 MG/3ML) 0.083% nebulizer solution Take 2.5 mg by nebulization 3 (three) times daily as needed for wheezing or shortness of breath.     [provider]  atenolol (TENORMIN) 25 MG tablet Take 25 mg by mouth daily.    [provider]  atorvastatin (LIPITOR) 40 MG tablet Take 1 tablet (40 mg total) by mouth daily. 10/06/12   Gust Rung, DO  budesonide-formoterol (SYMBICORT) 160-4.5 MCG/ACT inhaler Inhale 2 puffs into the lungs 2 (two) times daily.    [provider]   busPIRone (BUSPAR) 10 MG tablet Take 20 mg by mouth 2 (two) times daily.     [provider]  clotrimazole-betamethasone (LOTRISONE) cream Apply topically. 08/29/12   [provider]  cycloSPORINE (RESTASIS) 0.05 % ophthalmic emulsion Place 1 drop into both eyes 2 (two) times daily.    [provider]  fluticasone (FLOVENT DISKUS) 50 MCG/BLIST diskus inhaler Inhale 1 puff into the lungs 2 (two) times daily.    [provider]  hydrOXYzine (ATARAX/VISTARIL) 25 MG tablet Take 1 tablet (25 mg total) by mouth every 6 (six) hours. 10/30/17   Janne Napoleon, NP  linaclotide [facility]) 72 MCG capsule Take 1 capsule (72 mcg total) by mouth daily before breakfast for 15 days. 12/26/22 01/10/23  Allwardt, Crist Infante, PA-C  loratadine (CLARITIN) 10 MG tablet Take 10 mg by mouth daily.    [provider]  metFORMIN (GLUCOPHAGE) 1000 MG tablet Take 1 tablet (1,000 mg total) by mouth 2 (two) times daily. Patient taking differently: Take 500 mg by mouth daily with breakfast. 03/10/16   Audry Pili, PA-C  nystatin (MYCOSTATIN) 100000 UNIT/ML suspension Take 5 mLs by mouth 3 (three) times daily. GARGLE AND SWALLOW    [provider]  pregabalin (LYRICA) 50 MG capsule Take 1 capsule (50 mg total) by mouth 2 (two) times daily for 5 days. 12/26/22 12/31/22  Allwardt, Crist Infante, PA-C  QUEtiapine (SEROQUEL) 50 MG tablet Take 50 mg by mouth at bedtime.  [provider]  tiotropium (SPIRIVA) 18 MCG inhalation capsule Place 1 capsule (18 mcg total) into inhaler and inhale daily. 12/18/14   Valentino Nose, MD      Allergies    Fluocinolone, Suboxone [buprenorphine hcl-naloxone hcl], Ciprofloxacin, and Penicillins    Review of Systems   Review of Systems  Constitutional:  Positive for appetite change, chills and fatigue. Negative for fever.  Respiratory:  Positive for shortness of breath. Negative for cough and chest tightness.   Cardiovascular:  Positive for chest  pain. Negative for palpitations and leg swelling.  Gastrointestinal:  Positive for abdominal distention and abdominal pain. Negative for blood in stool, diarrhea and vomiting.  Genitourinary:  Negative for dysuria.  Musculoskeletal:  Positive for back pain.  Skin:  Negative for rash.  Neurological:  Negative for headaches.    Physical Exam Updated Vital Signs BP (!) 171/94   Pulse 86   Temp 98.6 F (37 C) (Oral)   Resp 19   SpO2 97%  Physical Exam Vitals and nursing note reviewed.  Constitutional:      General: She is not in acute distress.    Appearance: Normal appearance.  HENT:     Head: Normocephalic.     Mouth/Throat:     Mouth: Mucous membranes are moist.  Cardiovascular:     Rate and Rhythm: Normal rate.  Pulmonary:     Effort: Pulmonary effort is normal. No respiratory distress.     Breath sounds: Examination of the right-lower field reveals decreased breath sounds. Examination of the left-lower field reveals decreased breath sounds. Decreased breath sounds present.  Abdominal:     General: Bowel sounds are normal.     Palpations: Abdomen is soft.     Tenderness: There is no abdominal tenderness.     Comments: Bloated abdomen  Musculoskeletal:     Right lower leg: No edema.     Left lower leg: No edema.  Skin:    General: Skin is warm.  Neurological:     Mental Status: She is alert and oriented to person, place, and time. Mental status is at baseline.  Psychiatric:        Mood and Affect: Mood normal.     ED Results / Procedures / Treatments   Labs (all labs ordered are listed, but only abnormal results are displayed) Labs Reviewed  COMPREHENSIVE METABOLIC PANEL - Abnormal; Notable for the following components:      Result Value   Chloride 96 (*)    Glucose, Bld 143 (*)    BUN 7 (*)    Total Protein 6.3 (*)    All other components within normal limits  CBC - Abnormal; Notable for the following components:   WBC 12.5 (*)    All other components  within normal limits  CBG MONITORING, ED - Abnormal; Notable for the following components:   Glucose-Capillary 140 (*)    All other components within normal limits  LIPASE, BLOOD  URINALYSIS, ROUTINE W REFLEX MICROSCOPIC  TROPONIN I (HIGH SENSITIVITY)  TROPONIN I (HIGH SENSITIVITY)    EKG EKG Interpretation Date/Time:  Sunday December 27 2022 13:26:16 EDT Ventricular Rate:  82 PR Interval:  160 QRS Duration:  106 QT Interval:  360 QTC Calculation: 420 R Axis:   -16  Text Interpretation: Normal sinus rhythm Minimal voltage criteria for LVH, may be normal variant ( Cornell product ) Septal infarct , age undetermined Abnormal ECG When compared with ECG of 26-Dec-2022 18:24, PREVIOUS ECG IS PRESENT Similar to previous  Confirmed by Coralee Pesa [phone]) on 12/27/2022 3:25:12 PM  Radiology No results found.  Procedures Procedures    Medications Ordered in ED Medications  fentaNYL (SUBLIMAZE) injection 50 mcg (has no administration in time range)  sodium chloride 0.9 % bolus 500 mL (has no administration in time range)  famotidine (PEPCID) IVPB 20 mg premix (has no administration in time range)    ED Course/ Medical Decision Making/ A&P                                 Medical Decision Making Amount and/or Complexity of Data Reviewed Labs: ordered. Radiology: ordered.  Risk Prescription drug management.   67 year old female presents emergency department with generalized abdominal pain and right sided chest pain.  Slightly hypertensive on arrival but otherwise normal vitals.  No distress, abdomen is generally discomfort and bloated but no acute findings.  Blood work is reassuring.  EKG is unchanged for her, troponins are negative.  CT of the chest, abdomen pelvis identified no acute finding.  After IV medicine she is feeling improved, asking to eat and tolerated p.o.  Will plan for outpatient follow-up.  Patient at this time appears safe and stable for discharge and  close outpatient follow up. Discharge plan and strict return to ED precautions discussed, patient verbalizes understanding and agreement.        Final Clinical Impression(s) / ED Diagnoses Final diagnoses:  None    Rx / DC Orders ED Discharge Orders     None         Rozelle Logan, DO 12/27/22 2100

## 2022-12-27 NOTE — ED Notes (Signed)
Pt states her pain is coming back. RN notified EDP

## 2022-12-28 ENCOUNTER — Encounter: Payer: 59 | Admitting: Podiatry

## 2022-12-28 NOTE — Progress Notes (Signed)
Patient was a no show for today's scheduled appt.

## 2022-12-29 ENCOUNTER — Other Ambulatory Visit: Payer: Self-pay | Admitting: Physician Assistant

## 2023-01-26 ENCOUNTER — Ambulatory Visit (INDEPENDENT_AMBULATORY_CARE_PROVIDER_SITE_OTHER): Payer: Medicare HMO

## 2023-01-26 ENCOUNTER — Ambulatory Visit (INDEPENDENT_AMBULATORY_CARE_PROVIDER_SITE_OTHER): Payer: Medicare HMO | Admitting: Podiatry

## 2023-01-26 DIAGNOSIS — R234 Changes in skin texture: Secondary | ICD-10-CM

## 2023-01-26 DIAGNOSIS — M778 Other enthesopathies, not elsewhere classified: Secondary | ICD-10-CM | POA: Diagnosis not present

## 2023-01-26 MED ORDER — DOXYCYCLINE HYCLATE 100 MG PO CAPS
100.0000 mg | ORAL_CAPSULE | Freq: Two times a day (BID) | ORAL | 0 refills | Status: AC
Start: 2023-01-26 — End: 2023-02-05

## 2023-01-26 NOTE — Progress Notes (Unsigned)
Chief Complaint  Patient presents with   Foot Pain    Right foot pain woke up on Friday with on the bottom of the foot still having pain.   HPI: 68 y.o. female presenting today with a crack in her skin on bottom of right foot.  Notes it is tender.   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   S/p radiation ablation with iodine per patient 1982   Heart murmur    Hernia, umbilical    Herniated disc    Herniated disc    Hyperlipidemia    Hypertension    Menopause    Neuropathy    OSA (obstructive sleep apnea)    has a cpap   Osteoporosis    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   Rheumatoid arthritis (HCC)    Shingles    Sleep apnea    wear c-pap   Tear of lateral meniscus of right knee 10/13/2013   Wears dentures    top   Wears glasses     Past Surgical History:  Procedure Laterality Date   CHOLECYSTECTOMY     EYE SURGERY     eye back in socker-rt   FOOT SURGERY     cyst rt foot   INCISION AND DRAINAGE OF PERITONSILLAR ABCESS Right 06/09/2016   Procedure: INCISION AND DRAINAGE OF PERITONSILLAR ABCESS;  Surgeon: Flo Shanks, MD;  Location: [facility] OR;  Service: ENT;  Laterality: Right;   KNEE ARTHROSCOPY WITH LATERAL MENISECTOMY Right 10/13/2013   Procedure: RIGHT KNEE ARTHROSCOPY WITH PARTIAL LATERAL MENISECTOMY/DEBRIDEMENT/SHAVING ;  Surgeon: Eulas Post, MD;  Location: [facility];  Service: Orthopedics;  Laterality: Right;   LAPAROSCOPY     age 76   LEFT HEART  CATHETERIZATION WITH CORONARY ANGIOGRAM N/A 05/30/2012   Procedure: LEFT HEART CATHETERIZATION WITH CORONARY ANGIOGRAM;  Surgeon: [name], MD;  Location: [facility] CATH LAB;  Service: Cardiovascular;  Laterality: N/A;   TONSILLECTOMY     TONSILLECTOMY AND ADENOIDECTOMY Right 06/09/2016   Procedure: RIGHT TONSILECTOMY;  Surgeon: Flo Shanks, MD;  Location: [facility] OR;  Service: ENT;  Laterality: Right;   TUBAL LIGATION      Allergies  Allergen Reactions   Fluocinolone Hives    Pt does not recall this as a allergy   Suboxone [Buprenorphine Hcl-Naloxone Hcl] Itching    Vaginal itching    Ciprofloxacin Hives and Itching   Penicillins Hives and Itching    Has patient had a PCN reaction causing immediate rash, facial/tongue/throat swelling, SOB or lightheadedness with hypotension: Yes Has patient had a PCN reaction causing severe rash involving mucus membranes or skin necrosis: No Has patient had a PCN reaction that required hospitalization: No Has patient had a PCN reaction occurring within the last 10 years: No If all of the above answers are "NO", then may proceed with Cephalosporin use.     PHYSICAL  EXAM: General: The patient is alert and oriented x3 in no acute distress.  Dermatology: Skin is warm, dry and supple bilateral lower extremities. Interspaces are clear of maceration and debris.      Wound 1 description:  Location:  submet 5 right foot    Depth:  dermis    Wound Border:  mild hyperkeratosis    Wound Base:  granular Odor?:  no    Surrounding Tissue:  hyperkeratotic.  No fluctuance or erythema.      Infected?:  not grossly    Necrosis?:  no    Pain?:  yes    Tunneling:  no    Dimensions (cm):  0.2cm x 0.8cm x 0.1cm  Vascular: Pedal pulses are diminished  Neurological: Light touch sensation deminished.  Protective sensation intact.  RADIOGRAPHIC EXAM (right foot, 2wb views, 01/28/23):  Normal osseous mineralization. Mild spur on lateral 5th metatarsal head.  NO  osteolysis noted.  No fracture seen.  ASSESSMENT / PLAN OF CARE: 1. Fissure in skin of right foot     Meds ordered this encounter  Medications   doxycycline (VIBRAMYCIN) 100 MG capsule    Sig: Take 1 capsule (100 mg total) by mouth 2 (two) times daily for 10 days.    Dispense:  20 capsule    Refill:  0   The fissure was sharply debrided of hyperkeratotic and devitalized soft tissue with sterile #312 blade to the level of dermis .  Hemostasis obtained.  Steristrips, antibiotic ointment and DSD applied.  Reviewed off-loading with patient.  Surgical shoe dispensed for patient to wear at all times WB.   Reviewed daily dressing changes with patient.  She can use abx ointment and a Bandaid daily.  Discussed risks / concerns regarding ulcer with patient and possible sequelae if left untreated.  Stressed importance of infection prevention at home. Short-term goals are: prevent infection, off-load ulcer, heal ulcer Long-term goals are:  prevent recurrence, prevent amputation.   Return in about 2 weeks (around 02/09/2023) for recheck skin crack R foot.   Clerance Lav, DPM, FACFAS [facility]     [address] [place], [place] [address]                Office [phone]  Fax [phone]

## 2023-02-08 ENCOUNTER — Encounter: Payer: Medicare HMO | Admitting: Podiatry

## 2023-02-08 NOTE — Progress Notes (Signed)
Patient was a no-show for today's scheduled appointment.

## 2023-04-13 ENCOUNTER — Encounter (HOSPITAL_COMMUNITY): Payer: Self-pay | Admitting: Emergency Medicine

## 2023-04-13 ENCOUNTER — Emergency Department (HOSPITAL_COMMUNITY)
Admission: EM | Admit: 2023-04-13 | Discharge: 2023-04-13 | Disposition: A | Discharge status: Home / Self Care | Payer: Medicare HMO | Attending: Emergency Medicine | Admitting: Emergency Medicine

## 2023-04-13 ENCOUNTER — Emergency Department (HOSPITAL_COMMUNITY): Payer: Medicare HMO

## 2023-04-13 DIAGNOSIS — J45909 Unspecified asthma, uncomplicated: Secondary | ICD-10-CM | POA: Insufficient documentation

## 2023-04-13 DIAGNOSIS — Z79899 Other long term (current) drug therapy: Secondary | ICD-10-CM | POA: Insufficient documentation

## 2023-04-13 DIAGNOSIS — I10 Essential (primary) hypertension: Secondary | ICD-10-CM | POA: Insufficient documentation

## 2023-04-13 DIAGNOSIS — E119 Type 2 diabetes mellitus without complications: Secondary | ICD-10-CM | POA: Insufficient documentation

## 2023-04-13 DIAGNOSIS — R059 Cough, unspecified: Secondary | ICD-10-CM | POA: Diagnosis present

## 2023-04-13 DIAGNOSIS — J449 Chronic obstructive pulmonary disease, unspecified: Secondary | ICD-10-CM | POA: Diagnosis not present

## 2023-04-13 DIAGNOSIS — Z20822 Contact with and (suspected) exposure to covid-19: Secondary | ICD-10-CM | POA: Insufficient documentation

## 2023-04-13 DIAGNOSIS — Z7984 Long term (current) use of oral hypoglycemic drugs: Secondary | ICD-10-CM | POA: Diagnosis not present

## 2023-04-13 DIAGNOSIS — J101 Influenza due to other identified influenza virus with other respiratory manifestations: Secondary | ICD-10-CM | POA: Insufficient documentation

## 2023-04-13 DIAGNOSIS — R051 Acute cough: Secondary | ICD-10-CM | POA: Insufficient documentation

## 2023-04-13 LAB — RESP PANEL BY RT-PCR (RSV, FLU A&B, COVID)  RVPGX2
Influenza A by PCR: POSITIVE — AB
Influenza B by PCR: NEGATIVE
Resp Syncytial Virus by PCR: NEGATIVE
SARS Coronavirus 2 by RT PCR: NEGATIVE

## 2023-04-13 MED ORDER — BENZONATATE 100 MG PO CAPS
100.0000 mg | ORAL_CAPSULE | Freq: Once | ORAL | Status: AC
  Administered 2023-04-13: 100 mg via ORAL
  Filled 2023-04-13: qty 1

## 2023-04-13 MED ORDER — IBUPROFEN 800 MG PO TABS
800.0000 mg | ORAL_TABLET | Freq: Once | ORAL | Status: AC
  Administered 2023-04-13: 800 mg via ORAL
  Filled 2023-04-13: qty 1

## 2023-04-13 MED ORDER — OSELTAMIVIR PHOSPHATE 75 MG PO CAPS
75.0000 mg | ORAL_CAPSULE | Freq: Two times a day (BID) | ORAL | 0 refills | Status: AC
Start: 2023-04-13 — End: ?

## 2023-04-13 MED ORDER — ALBUTEROL SULFATE HFA 108 (90 BASE) MCG/ACT IN AERS
4.0000 | INHALATION_SPRAY | Freq: Once | RESPIRATORY_TRACT | Status: AC
  Administered 2023-04-13: 4 via RESPIRATORY_TRACT
  Filled 2023-04-13: qty 6.7

## 2023-04-13 MED ORDER — BENZONATATE 100 MG PO CAPS
100.0000 mg | ORAL_CAPSULE | Freq: Three times a day (TID) | ORAL | 0 refills | Status: AC
Start: 2023-04-13 — End: ?

## 2023-04-13 NOTE — ED Provider Notes (Signed)
 [place] EMERGENCY DEPARTMENT AT [facility] Provider Note   CSN: [id number] Arrival date & time: 04/13/23  9165     History  No chief complaint on file.   Vanessa [name] is a 68 y.o. female with a past medical history significant for hypertension, asthma, diabetes, anxiety, Graves' disease, chronic back pain, RA, COPD, and hyperlipidemia who presents to the ED due to cough.  Patient states she started feeling unwell yesterday when she woke up.  Admits to a dry cough.  Also endorses shortness of breath only while coughing.  Admits to some chest pain with palpation to anterior chest wall and while coughing.  Admits to subjective fever.  Denies abdominal pain, nausea, vomiting, and diarrhea.  Has been taking over-the-counter cough and cold medication with no relief.  History obtained from patient and past medical records. No interpreter used during encounter.       Home Medications Prior to Admission medications   Medication Sig Start Date End Date Taking? Authorizing Provider  benzonatate  (TESSALON ) 100 MG capsule Take 1 capsule (100 mg total) by mouth every 8 (eight) hours. 04/13/23  Yes Vonte Rossin C, PA-C  oseltamivir  (TAMIFLU ) 75 MG capsule Take 1 capsule (75 mg total) by mouth every 12 (twelve) hours. 04/13/23  Yes Yanessa Hocevar C, PA-C  acyclovir  (ZOVIRAX ) 400 MG tablet Take by mouth. 08/29/12   [provider]  albuterol  (PROAIR  HFA) 108 (90 BASE) MCG/ACT inhaler Inhale 2 puffs into the lungs 4 (four) times daily. For COPD    [provider]  albuterol  (PROVENTIL ) (2.5 MG/3ML) 0.083% nebulizer solution Take 2.5 mg by nebulization 3 (three) times daily as needed for wheezing or shortness of breath.     [provider]  atenolol  (TENORMIN ) 25 MG tablet Take 1 tablet (25 mg total) by mouth daily. 12/27/22   Horton, Roxie HERO, DO  atorvastatin  (LIPITOR) 40 MG tablet Take 1 tablet (40 mg total) by mouth daily. 10/06/12   Rosan Dayton BROCKS, DO   budesonide-formoterol (SYMBICORT) 160-4.5 MCG/ACT inhaler Inhale 2 puffs into the lungs 2 (two) times daily.    [provider]  busPIRone  (BUSPAR ) 10 MG tablet Take 20 mg by mouth 2 (two) times daily.     [provider]  clotrimazole-betamethasone (LOTRISONE) cream Apply topically. 08/29/12   [provider]  cycloSPORINE  (RESTASIS ) 0.05 % ophthalmic emulsion Place 1 drop into both eyes 2 (two) times daily.    [provider]  fluticasone  (FLOVENT  DISKUS) 50 MCG/BLIST diskus inhaler Inhale 1 puff into the lungs 2 (two) times daily.    [provider]  hydrOXYzine  (ATARAX /VISTARIL ) 25 MG tablet Take 1 tablet (25 mg total) by mouth every 6 (six) hours. 10/30/17   Jamelle Lorrayne HERO, NP  linaclotide  (LINZESS ) 72 MCG capsule Take 1 capsule (72 mcg total) by mouth daily before breakfast for 15 days. 12/26/22 01/10/23  Allwardt, Alyssa M, PA-C  loratadine  (CLARITIN ) 10 MG tablet Take 10 mg by mouth daily.    [provider]  metFORMIN  (GLUCOPHAGE ) 1000 MG tablet Take 1 tablet (1,000 mg total) by mouth 2 (two) times daily. Patient taking differently: Take 500 mg by mouth daily with breakfast. 03/10/16   Olympia Gee, PA-C  nystatin  (MYCOSTATIN ) 100000 UNIT/ML suspension Take 5 mLs by mouth 3 (three) times daily. GARGLE AND SWALLOW    [provider]  pregabalin  (LYRICA ) 50 MG capsule Take 1 capsule (50 mg total) by mouth 2 (two) times daily for 5 days. 12/26/22 12/31/22  Allwardt, Alyssa  M, PA-C  QUEtiapine  (SEROQUEL ) 50 MG tablet Take 50 mg by mouth at bedtime.    [provider]  tiotropium (SPIRIVA ) 18 MCG inhalation capsule Place 1 capsule (18 mcg total) into inhaler and inhale daily. 12/18/14   Glennon Lot, MD      Allergies    Fluocinolone, Suboxone [buprenorphine hcl-naloxone hcl], Ciprofloxacin, and Penicillins    Review of Systems   Review of Systems  Constitutional:  Positive for fever.  Respiratory:  Positive for cough and  shortness of breath.   Cardiovascular:  Positive for chest pain.    Physical Exam Updated Vital Signs BP (!) 148/84 (BP Location: Left Arm)   Pulse 79   Temp (!) 100.8 F (38.2 C) (Oral)   Resp 16   Ht 5' 8 (1.727 m)   SpO2 97%   BMI 32.99 kg/m  Physical Exam Vitals and nursing note reviewed.  Constitutional:      General: She is not in acute distress.    Appearance: She is not ill-appearing.  HENT:     Head: Normocephalic.  Eyes:     Pupils: Pupils are equal, round, and reactive to light.  Cardiovascular:     Rate and Rhythm: Normal rate and regular rhythm.     Pulses: Normal pulses.     Heart sounds: Normal heart sounds. No murmur heard.    No friction rub. No gallop.  Pulmonary:     Effort: Pulmonary effort is normal.     Breath sounds: Normal breath sounds.     Comments: Respirations equal and unlabored, patient able to speak in full sentences, lungs clear to auscultation bilaterally Chest:     Comments: Anterior chest wall tenderness without crepitus or deformity Abdominal:     General: Abdomen is flat. There is no distension.     Palpations: Abdomen is soft.     Tenderness: There is no abdominal tenderness. There is no guarding or rebound.  Musculoskeletal:        General: Normal range of motion.     Cervical back: Neck supple.  Skin:    General: Skin is warm and dry.  Neurological:     General: No focal deficit present.     Mental Status: She is alert.  Psychiatric:        Mood and Affect: Mood normal.        Behavior: Behavior normal.    ED Results / Procedures / Treatments   Labs (all labs ordered are listed, but only abnormal results are displayed) Labs Reviewed  RESP PANEL BY RT-PCR (RSV, FLU A&B, COVID)  [id number] - Abnormal; Notable for the following components:      Result Value   Influenza A by PCR POSITIVE (*)    All other components within normal limits    EKG None  Radiology DG Chest 2 View Result Date: 04/13/2023 CLINICAL DATA:   Cough, fever EXAM: CHEST - 2 VIEW COMPARISON:  07/14/2017.  CT 12/27/2022 FINDINGS: Heart and mediastinal contours are within normal limits. No focal opacities or effusions. No acute bony abnormality. IMPRESSION: No active cardiopulmonary disease. Electronically Signed   By: Franky Crease M.D.   On: 04/13/2023 09:51    Procedures Procedures    Medications Ordered in ED Medications  benzonatate  (TESSALON ) capsule 100 mg (has no administration in time range)  ibuprofen  (ADVIL ) tablet 800 mg (800 mg Oral Given 04/13/23 0848)    ED Course/ Medical Decision Making/ A&P Clinical Course as of 04/13/23 1042  Tue Apr 13, 2023  1000 Influenza A By PCR(!): POSITIVE [CA]    Clinical Course User Index [CA] Lorelle Aleck BROCKS, PA-C                                 Medical Decision Making Amount and/or Complexity of Data Reviewed Labs: ordered. Decision-making details documented in ED Course. Radiology: ordered and independent interpretation performed. Decision-making details documented in ED Course.  Risk Prescription drug management.   67 year old female presents to the ED due to cough, shortness of breath, and fever that started yesterday.  Admits to shortness of breath and chest pain only while coughing.  Upon arrival patient febrile 100.8 F.  Normal O2 saturation.  Patient nonseptic appearing during ischial evaluation.  Patient coughing during initial evaluation.  Speaking in full sentences.  Lungs clear to auscultation bilaterally.  No stridor or wheeze.  RVP positive for influenza A.  Chest x-ray personally viewed and interpreted negative for signs of pneumonia.  EKG normal sinus rhythm.  No signs of acute ischemia.  Low suspicion for ACS given chest pain is atypical and only occurs with coughing and palpation to chest wall.  Suspect symptoms related to influenza A.  Patient discharged with Tessalon  Perles and Tamiflu .  Discussed side effects of Tamiflu  with patient.  No signs of respiratory  distress. Patient requesting albuterol . Albuterol  given. Patient stable for discharge. Strict ED precautions discussed with patient. Patient states understanding and agrees to plan. Patient discharged home in no acute distress and stable vitals  Has PCP Hx COPD Lives at home Elderly >65 year       Final Clinical Impression(s) / ED Diagnoses Final diagnoses:  Influenza A  Acute cough    Rx / DC Orders ED Discharge Orders          Ordered    benzonatate  (TESSALON ) 100 MG capsule  Every 8 hours        04/13/23 1042    oseltamivir  (TAMIFLU ) 75 MG capsule  Every 12 hours        04/13/23 1042              Lorelle Aleck BROCKS DEVONNA 04/13/23 1211    Garrick Charleston, MD 04/13/23 1531

## 2023-04-13 NOTE — Discharge Instructions (Signed)
 It was a pleasure taking care of you today.  As discussed, your flu test was positive which is likely causing your symptoms.  I am sending you home with cough medication.  I am also sending you home with Tamiflu .  Take as prescribed.  Please follow-up with PCP if symptoms do not improve over the next 2 to 3 days.  Return to the ER for any worsening symptoms.

## 2023-04-13 NOTE — ED Triage Notes (Signed)
 Pt here from home with c/o dry cough and maybe a fever at home , took tylenol at 3 am

## 2023-06-14 ENCOUNTER — Emergency Department (HOSPITAL_COMMUNITY)

## 2023-06-14 ENCOUNTER — Emergency Department (HOSPITAL_COMMUNITY)
Admission: EM | Admit: 2023-06-14 | Discharge: 2023-06-14 | Discharge status: Against Medical Advice | Attending: Emergency Medicine | Admitting: Emergency Medicine

## 2023-06-14 ENCOUNTER — Encounter (HOSPITAL_COMMUNITY): Payer: Self-pay

## 2023-06-14 DIAGNOSIS — Z5321 Procedure and treatment not carried out due to patient leaving prior to being seen by health care provider: Secondary | ICD-10-CM | POA: Insufficient documentation

## 2023-06-14 DIAGNOSIS — R059 Cough, unspecified: Secondary | ICD-10-CM | POA: Insufficient documentation

## 2023-06-14 DIAGNOSIS — R1013 Epigastric pain: Secondary | ICD-10-CM | POA: Insufficient documentation

## 2023-06-14 DIAGNOSIS — R079 Chest pain, unspecified: Secondary | ICD-10-CM | POA: Diagnosis present

## 2023-06-14 LAB — BASIC METABOLIC PANEL
Anion gap: 11 (ref 5–15)
BUN: 10 mg/dL (ref 8–23)
CO2: 30 mmol/L (ref 22–32)
Calcium: 9.2 mg/dL (ref 8.9–10.3)
Chloride: 94 mmol/L — ABNORMAL LOW (ref 98–111)
Creatinine, Ser: 1.48 mg/dL — ABNORMAL HIGH (ref 0.44–1.00)
GFR, Estimated: 39 mL/min — ABNORMAL LOW (ref >60)
Glucose, Bld: 231 mg/dL — ABNORMAL HIGH (ref 70–99)
Potassium: 3.6 mmol/L (ref 3.5–5.1)
Sodium: 135 mmol/L (ref 135–145)

## 2023-06-14 LAB — HEPATIC FUNCTION PANEL
ALT: 24 U/L (ref 0–44)
AST: 21 U/L (ref 15–41)
Albumin: 4 g/dL (ref 3.5–5.0)
Alkaline Phosphatase: 39 U/L (ref 38–126)
Bilirubin, Direct: <0.1 mg/dL (ref 0.0–0.2)
Total Bilirubin: 0.7 mg/dL (ref 0.0–1.2)
Total Protein: 6.7 g/dL (ref 6.5–8.1)

## 2023-06-14 LAB — CBC
HCT: 37.4 % (ref 36.0–46.0)
Hemoglobin: 12.5 g/dL (ref 12.0–15.0)
MCH: 30.8 pg (ref 26.0–34.0)
MCHC: 33.4 g/dL (ref 30.0–36.0)
MCV: 92.1 fL (ref 80.0–100.0)
Platelets: 231 10*3/uL (ref 150–400)
RBC: 4.06 MIL/uL (ref 3.87–5.11)
RDW: 13.5 % (ref 11.5–15.5)
WBC: 12 10*3/uL — ABNORMAL HIGH (ref 4.0–10.5)
nRBC: 0 % (ref 0.0–0.2)

## 2023-06-14 LAB — TROPONIN I (HIGH SENSITIVITY)
Troponin I (High Sensitivity): 6 ng/L (ref <18)
Troponin I (High Sensitivity): 5 ng/L (ref <18)

## 2023-06-14 LAB — LIPASE, BLOOD: Lipase: 51 U/L (ref 11–51)

## 2023-06-14 NOTE — ED Notes (Signed)
 Pt left d/t wait time stating "I just came to see if I'm allowed to drink since I'm finished with my antibiotics."

## 2023-06-14 NOTE — ED Triage Notes (Signed)
 Pt c/o intermittent epigastric and generalized chest pain, nasal/chest congestion, hoarse voice, bilateral feet swelling, generalized muscle aches, and SOB after being diagnosed w/ the flu in January.  Pain score 10/10.  Hx of COPD.     Pt has been seen by PCP for same and reports completing all prescriptions and taking OTC medications.

## 2023-06-14 NOTE — ED Provider Triage Note (Signed)
 Emergency Medicine Provider Triage Evaluation Note  Vanessa [name] , a 68 y.o. female  was evaluated in triage.  Pt complains of ongoing chest pain, cough, abdominal pain. Recent flu dx. taking steroids, abx cold medication without relief. Chest pain worse with cough. Not worse with exertion.  Review of Systems  Positive: Chest pain, shob Negative: Fever, chills  Physical Exam  BP (!) 160/98 (BP Location: Right Arm)   Pulse 87   Temp 99 F (37.2 C)   Resp 19   Ht 5\' 8"  (1.727 m)   Wt 98.4 kg   SpO2 98%   BMI 32.99 kg/m  Gen:   Awake, no distress   Resp:  Normal effort, no wheezing, rhonchi, stridor, rales MSK:   Moves extremities without difficulty  Other:  Focal ttp of epigastric region, no rebound, rigidity, guarding throughout  Medical Decision Making  Medically screening exam initiated at 4:14 PM.  Appropriate orders placed.  Vanessa [name] was informed that the remainder of the evaluation will be completed by another provider, this initial triage assessment does not replace that evaluation, and the importance of remaining in the ED until their evaluation is complete.  Workup initiated in triage    Vanessa [name], [place] 06/14/23 1619

## 2023-06-15 ENCOUNTER — Encounter: Admitting: Podiatry

## 2023-06-15 NOTE — Progress Notes (Signed)
Patient did not show for scheduled appointment today.

## 2023-07-12 ENCOUNTER — Other Ambulatory Visit: Payer: Self-pay | Admitting: Student

## 2023-07-12 DIAGNOSIS — K5792 Diverticulitis of intestine, part unspecified, without perforation or abscess without bleeding: Secondary | ICD-10-CM

## 2023-07-28 ENCOUNTER — Encounter: Payer: Self-pay | Admitting: Student

## 2023-07-28 ENCOUNTER — Other Ambulatory Visit

## 2023-07-30 ENCOUNTER — Other Ambulatory Visit: Payer: Self-pay

## 2023-07-30 ENCOUNTER — Emergency Department (HOSPITAL_COMMUNITY)
Admission: EM | Admit: 2023-07-30 | Discharge: 2023-07-30 | Disposition: A | Discharge status: Home / Self Care | Attending: Emergency Medicine | Admitting: Emergency Medicine

## 2023-07-30 ENCOUNTER — Emergency Department (HOSPITAL_COMMUNITY)

## 2023-07-30 DIAGNOSIS — M25562 Pain in left knee: Secondary | ICD-10-CM | POA: Diagnosis present

## 2023-07-30 DIAGNOSIS — Z7984 Long term (current) use of oral hypoglycemic drugs: Secondary | ICD-10-CM | POA: Insufficient documentation

## 2023-07-30 DIAGNOSIS — E119 Type 2 diabetes mellitus without complications: Secondary | ICD-10-CM | POA: Diagnosis not present

## 2023-07-30 MED ORDER — ACETAMINOPHEN 325 MG PO TABS
650.0000 mg | ORAL_TABLET | Freq: Once | ORAL | Status: AC
  Administered 2023-07-30: 650 mg via ORAL
  Filled 2023-07-30: qty 2

## 2023-07-30 NOTE — Discharge Instructions (Signed)
 You are seen in the emergency department today for concerns of left-sided knee pain.  Your x-ray does show notable arthritis which may explain some of her pain.  Without any recent injury, I am doubtful of any fracture or dislocation to the area.  I do recommend an ultrasound of your left leg to assess for any possible blood clots.  We are unable to perform this this evening but I placed an order for this to be done tomorrow morning.  Please present to the emergency department for ultrasound imaging of the left leg for assessment of possible blood clot.  For any concerns of any new or worsening symptoms, return to the emergency department.

## 2023-07-30 NOTE — ED Triage Notes (Signed)
 Patient woke up with L knee pain today. Arrives with brace applied. Reports taking 750 mg of Robaxin  and using ice and heat on it today without improvement. Denies injury. Pain worse with movement.

## 2023-07-30 NOTE — ED Provider Notes (Addendum)
 [place] EMERGENCY DEPARTMENT AT [facility] Provider Note   CSN: [id number] Arrival date & time: 07/30/23  1035     History Chief Complaint  Patient presents with   Knee Pain    Vanessa [name] is a 68 y.o. female.  Patient with past history significant for diabetes, renal failure, morbid obesity, GERD, lateral meniscus tear of the right knee presents to the emergency department today with concerns of knee pain.  She reports that she woke up this morning with sudden severe knee pain to the left knee.  She reports having some difficulty with weightbearing.  Denies any recent falls, injury or trauma.  Not, any blood thinners no prior history of PE or DVT.  Denies any recent fever, chills, body aches, knee swelling, or warm knee.   Knee Pain      Home Medications Prior to Admission medications   Medication Sig Start Date End Date Taking? Authorizing Provider  acyclovir  (ZOVIRAX ) 400 MG tablet Take by mouth. 08/29/12   [provider]  albuterol  (PROAIR  HFA) 108 (90 BASE) MCG/ACT inhaler Inhale 2 puffs into the lungs 4 (four) times daily. For COPD    [provider]  albuterol  (PROVENTIL ) (2.5 MG/3ML) 0.083% nebulizer solution Take 2.5 mg by nebulization 3 (three) times daily as needed for wheezing or shortness of breath.     [provider]  atenolol  (TENORMIN ) 25 MG tablet Take 1 tablet (25 mg total) by mouth daily. 12/27/22   Horton, Sidra Dredge, DO  atorvastatin  (LIPITOR) 40 MG tablet Take 1 tablet (40 mg total) by mouth daily. 10/06/12   Priscella Brooms, DO  benzonatate  (TESSALON ) 100 MG capsule Take 1 capsule (100 mg total) by mouth every 8 (eight) hours. 04/13/23   Aberman, Caroline C, PA-C  budesonide-formoterol (SYMBICORT) 160-4.5 MCG/ACT inhaler Inhale 2 puffs into the lungs 2 (two) times daily.    [provider]  busPIRone  (BUSPAR ) 10 MG tablet Take 20 mg by mouth 2 (two) times daily.     [provider]   clotrimazole-betamethasone (LOTRISONE) cream Apply topically. 08/29/12   [provider]  cycloSPORINE  (RESTASIS ) 0.05 % ophthalmic emulsion Place 1 drop into both eyes 2 (two) times daily.    [provider]  fluticasone  (FLOVENT  DISKUS) 50 MCG/BLIST diskus inhaler Inhale 1 puff into the lungs 2 (two) times daily.    [provider]  hydrOXYzine  (ATARAX /VISTARIL ) 25 MG tablet Take 1 tablet (25 mg total) by mouth every 6 (six) hours. 10/30/17   Hardie Leyland, NP  linaclotide  (LINZESS ) 72 MCG capsule Take 1 capsule (72 mcg total) by mouth daily before breakfast for 15 days. 12/26/22 01/10/23  Allwardt, Deleta Felix, PA-C  loratadine  (CLARITIN ) 10 MG tablet Take 10 mg by mouth daily.    [provider]  metFORMIN  (GLUCOPHAGE ) 1000 MG tablet Take 1 tablet (1,000 mg total) by mouth 2 (two) times daily. Patient taking differently: Take 500 mg by mouth daily with breakfast. 03/10/16   Tonya Fredrickson, PA-C  nystatin  (MYCOSTATIN ) 100000 UNIT/ML suspension Take 5 mLs by mouth 3 (three) times daily. GARGLE AND SWALLOW    [provider]  oseltamivir  (TAMIFLU ) 75 MG capsule Take 1 capsule (75 mg total) by mouth every 12 (twelve) hours. 04/13/23   Aberman, Caroline C, PA-C  pregabalin  (LYRICA ) 50 MG capsule Take 1 capsule (50 mg total) by mouth 2 (two) times daily for 5 days. 12/26/22 12/31/22  Allwardt, Alyssa M, PA-C  QUEtiapine  (SEROQUEL ) 50 MG tablet Take 50 mg  by mouth at bedtime.    [provider]  tiotropium (SPIRIVA ) 18 MCG inhalation capsule Place 1 capsule (18 mcg total) into inhaler and inhale daily. 12/18/14   Nino Bass, MD      Allergies    Fluocinolone, Suboxone [buprenorphine hcl-naloxone hcl], Ciprofloxacin, and Penicillins    Review of Systems   Review of Systems  Musculoskeletal:        Knee pain  All other systems reviewed and are negative.   Physical Exam Updated Vital Signs BP (!) 152/83 (BP Location: Left Arm)   Pulse 62   Temp  98.3 F (36.8 C) (Oral)   Resp 17   SpO2 98%  Physical Exam Vitals and nursing note reviewed.  Constitutional:      General: She is not in acute distress.    Appearance: She is well-developed.  HENT:     Head: Normocephalic and atraumatic.  Eyes:     Conjunctiva/sclera: Conjunctivae normal.  Cardiovascular:     Rate and Rhythm: Normal rate and regular rhythm.     Heart sounds: No murmur heard. Pulmonary:     Effort: Pulmonary effort is normal. No respiratory distress.     Breath sounds: Normal breath sounds.  Abdominal:     Palpations: Abdomen is soft.     Tenderness: There is no abdominal tenderness.  Musculoskeletal:        General: Tenderness present. No swelling, deformity or signs of injury.     Cervical back: Neck supple.       Legs:     Comments: Tenderness palpation midline in the left knee.  Also some inferior lateral tenderness.  No obvious swelling compared to the right knee.  There is some warmth to the left knee although patient was wearing a knee brace prior to examination.  Right knee feels unremarkable.  Skin:    General: Skin is warm and dry.     Capillary Refill: Capillary refill takes less than 2 seconds.  Neurological:     Mental Status: She is alert.  Psychiatric:        Mood and Affect: Mood normal.     ED Results / Procedures / Treatments   Labs (all labs ordered are listed, but only abnormal results are displayed) Labs Reviewed - No data to display  EKG None  Radiology DG Knee 2 Views Left Result Date: 07/30/2023 CLINICAL DATA:  Left knee pain beginning this morning. Arthroscopic surgery of the left knee 9 years ago. EXAM: LEFT KNEE - 1-2 VIEW COMPARISON:  None Available. FINDINGS: Moderate medial compartment joint space narrowing with mild peripheral osteophytosis. Moderate to severe patellofemoral joint space narrowing and peripheral osteophytosis. The lateral compartment joint space is maintained with moderate peripheral osteophytes seen. Tiny  joint effusion. No acute fracture or dislocation. 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 Electronically Signed   By: Bertina Broccoli M.D.   On: 07/30/2023 12:10    Procedures Procedures    Medications Ordered in ED Medications  acetaminophen  (TYLENOL ) tablet 650 mg (650 mg Oral Given 07/30/23 1751)    ED Course/ Medical Decision Making/ A&P                                 Medical Decision Making Amount and/or Complexity of Data Reviewed Radiology: ordered.  Risk OTC drugs.   This patient presents to the ED for concern of knee pain.  Differential diagnosis includes osteoarthritis,  DVT, septic arthritis, patellofemoral pain syndrome   Imaging Studies ordered:  I ordered imaging studies including x-ray of the left knee I independently visualized and interpreted imaging which showed moderate medial and moderate to severe patellofemoral compartment osteoarthritis. I agree with the radiologist interpretation   Medicines ordered and prescription drug management:  I ordered medication including Tylenol  for pain Reevaluation of the patient after these medicines showed that the patient improved I have reviewed the patients home medicines and have made adjustments as needed   Problem List / ED Course:  Patient presents to the emergency department today for concerns of left knee pain.  Reports has been ongoing for the last day and notably worse when she woke up this morning.  Denies any recent injury or trauma.  She does report that she has been striking her the left knee against one of the posts of her bed and is unsure this may be causing her symptoms.  Denies any other recent injury, or falls.  Denies any recent knee injections, steroid use, fevers, chills, or any swelling. Physical exam is largely unremarkable with no significant swelling seen to the left knee compared to the right.  Mild warmth over the left knee likely secondary  to patient having a knee brace placed over the knee.  Range of motion limited due to pain.  On palpation, there is midline tenderness.  Consistent with likely osteoarthritis. X-ray imaging of the left knee shows moderate medial moderate to severe patellofemoral compartment osteoarthritis.  Ultrasound imaging ordered for assessment for possible DVT.  Doubtful septic arthritis given no significant knee warmth, recent fevers, or generalized fatigue or weakness. Ultrasound imaging not available this evening.  Discussed with patient benefits of ultrasound imaging to rule out DVT.  Placed an order for outpatient ultrasound to be performed tomorrow morning.  I do feel the patient is fairly low risk at this time for a DVT and will hold off on anticoagulation until ultrasound imaging can be performed.  Advised strict return precautions.  Patient otherwise stable at this time for follow-up tomorrow morning with ultrasound imaging to assess for possible DVT.  Patient discharged home in stable condition.  Final Clinical Impression(s) / ED Diagnoses Final diagnoses:  Acute pain of left knee    Rx / DC Orders ED Discharge Orders          Ordered    LE Venous       Comments: IMPORTANT PATIENT INSTRUCTIONS:  You have been scheduled for an Outpatient Vascular Study at [facility].    If tomorrow is a Saturday, Sunday or holiday, please go to the [facility] Emergency Department Registration Desk at 11 am tomorrow morning and tell them you are there for a vascular study.   If tomorrow is a weekday (Monday-Friday), please go to [facility] Clinic Registration at 11 am and tell them you are there for a vascular study.   07/30/23 1924              Toribio Seiber A, PA-C 07/30/23 1931    Edit Ricciardelli A, PA-C 07/30/23 1931    Hershel Los, MD 07/30/23 [phone]

## 2023-07-31 ENCOUNTER — Other Ambulatory Visit (HOSPITAL_COMMUNITY): Payer: Self-pay

## 2023-07-31 ENCOUNTER — Ambulatory Visit (HOSPITAL_COMMUNITY): Attending: Emergency Medicine

## 2023-08-02 ENCOUNTER — Other Ambulatory Visit

## 2023-10-02 ENCOUNTER — Emergency Department (HOSPITAL_COMMUNITY)

## 2023-10-02 ENCOUNTER — Emergency Department (HOSPITAL_COMMUNITY)
Admission: EM | Admit: 2023-10-02 | Discharge: 2023-10-02 | Disposition: A | Discharge status: Home / Self Care | Attending: Emergency Medicine | Admitting: Emergency Medicine

## 2023-10-02 ENCOUNTER — Other Ambulatory Visit: Payer: Self-pay

## 2023-10-02 ENCOUNTER — Encounter (HOSPITAL_COMMUNITY): Payer: Self-pay

## 2023-10-02 DIAGNOSIS — S80211A Abrasion, right knee, initial encounter: Secondary | ICD-10-CM | POA: Diagnosis not present

## 2023-10-02 DIAGNOSIS — Y92831 Amusement park as the place of occurrence of the external cause: Secondary | ICD-10-CM | POA: Insufficient documentation

## 2023-10-02 DIAGNOSIS — Z720 Tobacco use: Secondary | ICD-10-CM | POA: Insufficient documentation

## 2023-10-02 DIAGNOSIS — Z79899 Other long term (current) drug therapy: Secondary | ICD-10-CM | POA: Insufficient documentation

## 2023-10-02 DIAGNOSIS — Z7984 Long term (current) use of oral hypoglycemic drugs: Secondary | ICD-10-CM | POA: Diagnosis not present

## 2023-10-02 DIAGNOSIS — W109XXA Fall (on) (from) unspecified stairs and steps, initial encounter: Secondary | ICD-10-CM | POA: Diagnosis not present

## 2023-10-02 DIAGNOSIS — M79642 Pain in left hand: Secondary | ICD-10-CM | POA: Diagnosis not present

## 2023-10-02 DIAGNOSIS — M25561 Pain in right knee: Secondary | ICD-10-CM | POA: Diagnosis present

## 2023-10-02 DIAGNOSIS — E119 Type 2 diabetes mellitus without complications: Secondary | ICD-10-CM | POA: Diagnosis not present

## 2023-10-02 DIAGNOSIS — W19XXXA Unspecified fall, initial encounter: Secondary | ICD-10-CM

## 2023-10-02 DIAGNOSIS — Y9301 Activity, walking, marching and hiking: Secondary | ICD-10-CM | POA: Diagnosis not present

## 2023-10-02 DIAGNOSIS — S8391XA Sprain of unspecified site of right knee, initial encounter: Secondary | ICD-10-CM | POA: Insufficient documentation

## 2023-10-02 DIAGNOSIS — Z7951 Long term (current) use of inhaled steroids: Secondary | ICD-10-CM | POA: Diagnosis not present

## 2023-10-02 DIAGNOSIS — I10 Essential (primary) hypertension: Secondary | ICD-10-CM | POA: Diagnosis not present

## 2023-10-02 DIAGNOSIS — J449 Chronic obstructive pulmonary disease, unspecified: Secondary | ICD-10-CM | POA: Diagnosis not present

## 2023-10-02 DIAGNOSIS — M25521 Pain in right elbow: Secondary | ICD-10-CM | POA: Diagnosis not present

## 2023-10-02 MED ORDER — IBUPROFEN 600 MG PO TABS: 600.0000 mg | ORAL_TABLET | Freq: Four times a day (QID) | ORAL | 0 refills | Status: AC | PRN

## 2023-10-02 MED ORDER — OXYCODONE-ACETAMINOPHEN 5-325 MG PO TABS
1.0000 | ORAL_TABLET | Freq: Once | ORAL | Status: AC
  Administered 2023-10-02: 1 via ORAL
  Filled 2023-10-02: qty 1

## 2023-10-02 NOTE — ED Notes (Signed)
 Patient Alert and oriented to baseline. Stable and ambulatory to baseline. Patient verbalized understanding of the discharge instructions.  Patient belongings were taken by the patient.

## 2023-10-02 NOTE — ED Triage Notes (Signed)
 [place] [employer] [place], pt c.o right knee pain that radiates up to her right hip and left hand pain in her palm. Pt ambulatory with walker

## 2023-10-02 NOTE — ED Provider Notes (Signed)
  Physical Exam  BP (!) 141/102   Pulse 77   Temp 98.5 F (36.9 C)   Resp 20   SpO2 94%   Physical Exam  Procedures  Procedures  ED Course / MDM    Medical Decision Making Amount and/or Complexity of Data Reviewed Radiology: ordered.  Risk Prescription drug management.    Vanessa [name] is a 68 y.o. female who presented to the ED today with right knee pain after slip [place] fall [place].  Pain radiates to the right hip, she also has left hand and right elbow pain.  Prior to assumption of care, extensive imaging has been ordered to rule out acute bony fractures.  Plan at this time is to review x-ray imaging, if negative x-rays, plan for discharge and outpatient follow-up.  Reviewed imaging which did not show any acute fracture at this time.  Reviewed this with patient, discussed plan to continue use of knee brace secondary to a sprain of the right knee, refer to orthopedics for follow-up and likely physical therapy.  Manage pain with outpatient course of ibuprofen , can alternate with acetaminophen  as needed.  Care plan was discussed with patient, she understands and agrees, and has no further concerns at this time.       Vanessa Dorn BROCKS, PA 10/02/23 1631    Vanessa Canning, MD 10/07/23 2004

## 2023-10-02 NOTE — ED Provider Notes (Signed)
 [place] EMERGENCY DEPARTMENT AT [facility] Provider Note   CSN: [id number] Arrival date & time: 10/02/23  1343     Patient presents with: Vanessa [name] is a 68 y.o. female.   The history is provided by the patient and medical records. No language interpreter was used.  Fall     68 year old female with history of alcohol and tobacco use, diabetes, hypertension, COPD, chronic back pain presenting for evaluation of a fall.  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  States her knee was caught in the railing and she has to pull it out which caused quite a bit of pain.  Because she was chaperoning the kids she was unable to follow-up until today.  At home she is complaining of pain to her right knee, which radiates up to her right hip, as well as pain to her left hand and right elbow.  She tries using knee sleeves, muscle rub, gabapentin , muscle relaxant with some relief.  She is up-to-date with tetanus.  She denies hitting her head or loss of consciousness.  She denies any significant back pain.  Prior to Admission medications   Medication Sig Start Date End Date Taking? Authorizing Provider  acyclovir  (ZOVIRAX ) 400 MG tablet Take by mouth. 08/29/12   [provider]  albuterol  (PROAIR  HFA) 108 (90 BASE) MCG/ACT inhaler Inhale 2 puffs into the lungs 4 (four) times daily. For COPD    [provider]  albuterol  (PROVENTIL ) (2.5 MG/3ML) 0.083% nebulizer solution Take 2.5 mg by nebulization 3 (three) times daily as needed for wheezing or shortness of breath.     [provider]  atenolol  (TENORMIN ) 25 MG tablet Take 1 tablet (25 mg total) by mouth daily. 12/27/22   Horton, Roxie HERO, DO  atorvastatin  (LIPITOR) 40 MG tablet Take 1 tablet (40 mg total) by mouth daily. 10/06/12   Rosan Dayton BROCKS, DO  benzonatate  (TESSALON ) 100 MG capsule Take 1 capsule (100 mg total) by mouth every 8 (eight) hours. 04/13/23   Aberman, Caroline C, PA-C  budesonide-formoterol (SYMBICORT) 160-4.5 MCG/ACT inhaler Inhale 2 puffs into the lungs 2 (two) times daily.    [provider]  busPIRone  (BUSPAR ) 10 MG tablet Take 20 mg by mouth 2 (two) times daily.     [provider]  clotrimazole-betamethasone (LOTRISONE) cream Apply topically. 08/29/12   [provider]  cycloSPORINE  (RESTASIS ) 0.05 % ophthalmic emulsion Place 1 drop into both eyes 2 (two) times daily.    [provider]  fluticasone  (FLOVENT  DISKUS) 50 MCG/BLIST diskus inhaler Inhale 1 puff into the lungs 2 (two) times daily.    [provider]  hydrOXYzine  (ATARAX /VISTARIL ) 25 MG tablet Take 1 tablet (25 mg total) by mouth every 6 (six) hours. 10/30/17   Jamelle Lorrayne HERO, NP  linaclotide  (LINZESS ) 72 MCG capsule Take 1 capsule (72 mcg total) by mouth daily before breakfast for 15 days. 12/26/22 01/10/23  Allwardt, Alyssa M, PA-C  loratadine  (CLARITIN ) 10 MG tablet Take 10 mg by mouth daily.    [provider]  metFORMIN  (GLUCOPHAGE ) 1000 MG tablet Take 1 tablet (1,000 mg total) by mouth 2 (two) times daily. Patient taking differently: Take 500 mg by mouth daily with breakfast. 03/10/16   Olympia Gee, PA-C  nystatin  (MYCOSTATIN ) 100000 UNIT/ML suspension Take 5  mLs by mouth 3 (three) times daily. GARGLE AND SWALLOW    [provider]  oseltamivir  (TAMIFLU ) 75 MG capsule Take 1 capsule (75 mg total) by mouth every 12 (twelve) hours. 04/13/23   Aberman, Caroline C, PA-C  pregabalin  (LYRICA ) 50 MG capsule Take 1 capsule (50 mg total) by mouth 2 (two) times daily for 5 days. 12/26/22 12/31/22  Allwardt, Alyssa M, PA-C  QUEtiapine  (SEROQUEL ) 50 MG tablet Take 50 mg by mouth at bedtime.    [provider]  tiotropium (SPIRIVA ) 18 MCG inhalation capsule Place 1 capsule (18 mcg total) into inhaler  and inhale daily. 12/18/14   Glennon Lot, MD    Allergies: Fluocinolone, Suboxone [buprenorphine hcl-naloxone hcl], Ciprofloxacin, and Penicillins    Review of Systems  Updated Vital Signs BP (!) 141/102   Pulse 77   Temp 98.5 F (36.9 C)   Resp 20   SpO2 94%   Physical Exam Vitals and nursing note reviewed.  Constitutional:      General: She is not in acute distress.    Appearance: She is well-developed. She is obese.  HENT:     Head: Atraumatic.  Eyes:     Conjunctiva/sclera: Conjunctivae normal.  Pulmonary:     Effort: Pulmonary effort is normal.  Abdominal:     Palpations: Abdomen is soft.     Tenderness: There is no abdominal tenderness.  Musculoskeletal:        General: Tenderness (Right knee: Abrasion noted to the anterior knee with normal flexion extension.  Patella is located.  Tenderness to palpation without any bruising noted.) present.     Cervical back: Neck supple.     Comments: Mild tenderness about left hand, right hand, bilateral hips, and right foot without any deformity decreased range of motion.  No significant midline spine tenderness.  Scalp nontender,  Skin:    Findings: No rash.  Neurological:     Mental Status: She is alert.  Psychiatric:        Mood and Affect: Mood normal.     (all labs ordered are listed, but only abnormal results are displayed) Labs Reviewed - No data to display  EKG: None  Radiology: No results found.   Procedures   Medications Ordered in the ED - No data to display                                  Medical Decision Making Amount and/or Complexity of Data Reviewed Radiology: ordered.   BP (!) 141/102   Pulse 77   Temp 98.5 F (36.9 C)   Resp 20   SpO2 94%   49:82 PM 68 year old female with history of alcohol and tobacco use, diabetes, hypertension, COPD, chronic back pain presenting for evaluation of a fall.  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  States her knee was caught in the railing and she has to pull it out which caused quite a bit of pain.  Because she was chaperoning the kids she was unable to follow-up until today.  At home she is complaining of pain to her right knee, which radiates up to her right hip, as well as pain to her left hand and right elbow.  She tries using knee sleeves, muscle rub,  gabapentin , muscle relaxant with some relief.  She is up-to-date with tetanus.  She denies hitting her head or loss of consciousness.  She denies any significant back pain.  Exam notable for some abrasion noted to the anterior right knee with tenderness to palpation.  Patient also has some tenderness to the lower extremities without any significant signs of injury.  X-rays ordered, pain medication given.  Patient signed out to oncoming provider who will follow-up on imaging results.  Anticipate if negative imaging patient should be able to go home with supportive care.     Final diagnoses:  None    ED Discharge Orders     None          Nivia Colon, PA-C 10/02/23 1505    Dean Clarity, MD 10/03/23 [phone]

## 2023-10-02 NOTE — Discharge Instructions (Addendum)
 You have been evaluated for your fall.  Fortunately no evidence of any broken bones or dislocation noted on today's exam.  You may continue to take muscle relaxant and anti-inflammatory medication as needed for your aches and pain.  Follow-up with orthopedic specialist for further care.  Return if any concern.

## 2024-05-01 ENCOUNTER — Ambulatory Visit

## 2024-05-09 ENCOUNTER — Ambulatory Visit
